# Patient Record
Sex: Female | Born: 1935 | Race: White | Hispanic: No | Smoking: Never smoker
Health system: Southern US, Community
[De-identification: ages and names within clinical notes are randomized; demographics above are authoritative.]

## PROBLEM LIST (undated history)

## (undated) DIAGNOSIS — I4891 Unspecified atrial fibrillation: Secondary | ICD-10-CM

## (undated) DIAGNOSIS — C801 Malignant (primary) neoplasm, unspecified: Secondary | ICD-10-CM

## (undated) DIAGNOSIS — R413 Other amnesia: Secondary | ICD-10-CM

## (undated) DIAGNOSIS — I499 Cardiac arrhythmia, unspecified: Secondary | ICD-10-CM

## (undated) DIAGNOSIS — I10 Essential (primary) hypertension: Secondary | ICD-10-CM

## (undated) HISTORY — PX: MASTOIDECTOMY: SHX711

## (undated) HISTORY — DX: Malignant (primary) neoplasm, unspecified: C80.1

## (undated) HISTORY — PX: OTHER SURGICAL HISTORY: SHX169

## (undated) HISTORY — DX: Unspecified atrial fibrillation: I48.91

## (undated) HISTORY — DX: Other amnesia: R41.3

## (undated) HISTORY — PX: BACK SURGERY: SHX140

## (undated) HISTORY — PX: TONSILLECTOMY: SUR1361

## (undated) HISTORY — DX: Essential (primary) hypertension: I10

## (undated) HISTORY — DX: Cardiac arrhythmia, unspecified: I49.9

---

## 1998-03-11 ENCOUNTER — Other Ambulatory Visit: Admission: RE | Admit: 1998-03-11 | Discharge: 1998-03-11 | Payer: Self-pay | Admitting: Obstetrics and Gynecology

## 1999-03-28 ENCOUNTER — Other Ambulatory Visit: Admission: RE | Admit: 1999-03-28 | Discharge: 1999-03-28 | Payer: Self-pay | Admitting: Obstetrics and Gynecology

## 1999-07-20 ENCOUNTER — Encounter (INDEPENDENT_AMBULATORY_CARE_PROVIDER_SITE_OTHER): Payer: Self-pay

## 1999-07-20 ENCOUNTER — Other Ambulatory Visit: Admission: RE | Admit: 1999-07-20 | Discharge: 1999-07-20 | Payer: Self-pay | Admitting: Obstetrics and Gynecology

## 2000-04-09 ENCOUNTER — Other Ambulatory Visit: Admission: RE | Admit: 2000-04-09 | Discharge: 2000-04-09 | Payer: Self-pay | Admitting: Obstetrics and Gynecology

## 2001-05-02 ENCOUNTER — Other Ambulatory Visit: Admission: RE | Admit: 2001-05-02 | Discharge: 2001-05-02 | Payer: Self-pay | Admitting: Obstetrics and Gynecology

## 2002-05-07 ENCOUNTER — Other Ambulatory Visit: Admission: RE | Admit: 2002-05-07 | Discharge: 2002-05-07 | Payer: Self-pay | Admitting: Obstetrics and Gynecology

## 2003-06-24 ENCOUNTER — Other Ambulatory Visit: Admission: RE | Admit: 2003-06-24 | Discharge: 2003-06-24 | Payer: Self-pay | Admitting: Obstetrics and Gynecology

## 2004-06-24 ENCOUNTER — Other Ambulatory Visit: Admission: RE | Admit: 2004-06-24 | Discharge: 2004-06-24 | Payer: Self-pay | Admitting: Obstetrics and Gynecology

## 2005-07-26 ENCOUNTER — Other Ambulatory Visit: Admission: RE | Admit: 2005-07-26 | Discharge: 2005-07-26 | Payer: Self-pay | Admitting: Obstetrics and Gynecology

## 2006-08-16 ENCOUNTER — Other Ambulatory Visit: Admission: RE | Admit: 2006-08-16 | Discharge: 2006-08-16 | Payer: Self-pay | Admitting: Obstetrics and Gynecology

## 2007-08-21 ENCOUNTER — Other Ambulatory Visit: Admission: RE | Admit: 2007-08-21 | Discharge: 2007-08-21 | Payer: Self-pay | Admitting: Obstetrics and Gynecology

## 2007-12-09 ENCOUNTER — Ambulatory Visit: Payer: Self-pay | Admitting: Internal Medicine

## 2008-09-15 ENCOUNTER — Ambulatory Visit: Payer: Self-pay | Admitting: Obstetrics and Gynecology

## 2008-10-19 ENCOUNTER — Ambulatory Visit: Payer: Self-pay | Admitting: Obstetrics and Gynecology

## 2008-10-23 HISTORY — PX: OTHER SURGICAL HISTORY: SHX169

## 2009-09-21 ENCOUNTER — Other Ambulatory Visit: Admission: RE | Admit: 2009-09-21 | Discharge: 2009-09-21 | Payer: Self-pay | Admitting: Obstetrics and Gynecology

## 2009-09-21 ENCOUNTER — Ambulatory Visit: Payer: Self-pay | Admitting: Obstetrics and Gynecology

## 2009-12-22 ENCOUNTER — Ambulatory Visit: Payer: Self-pay | Admitting: Internal Medicine

## 2010-09-28 ENCOUNTER — Ambulatory Visit: Payer: Self-pay | Admitting: Obstetrics and Gynecology

## 2010-10-06 ENCOUNTER — Ambulatory Visit: Payer: Self-pay | Admitting: Obstetrics and Gynecology

## 2011-07-24 ENCOUNTER — Ambulatory Visit: Payer: Self-pay | Admitting: Internal Medicine

## 2011-08-02 ENCOUNTER — Ambulatory Visit: Payer: Self-pay | Admitting: Internal Medicine

## 2011-08-24 ENCOUNTER — Ambulatory Visit: Payer: Self-pay | Admitting: Internal Medicine

## 2011-10-25 ENCOUNTER — Encounter: Payer: Self-pay | Admitting: Obstetrics and Gynecology

## 2011-10-30 ENCOUNTER — Encounter: Payer: Self-pay | Admitting: Gynecology

## 2011-10-30 DIAGNOSIS — C801 Malignant (primary) neoplasm, unspecified: Secondary | ICD-10-CM | POA: Insufficient documentation

## 2011-10-30 DIAGNOSIS — I10 Essential (primary) hypertension: Secondary | ICD-10-CM | POA: Insufficient documentation

## 2011-11-03 ENCOUNTER — Encounter: Payer: Self-pay | Admitting: Obstetrics and Gynecology

## 2011-11-06 ENCOUNTER — Other Ambulatory Visit (HOSPITAL_COMMUNITY)
Admission: RE | Admit: 2011-11-06 | Discharge: 2011-11-06 | Disposition: A | Discharge status: Home / Self Care | Payer: Medicare Other | Source: Ambulatory Visit | Attending: Obstetrics and Gynecology | Admitting: Obstetrics and Gynecology

## 2011-11-06 ENCOUNTER — Encounter: Payer: Self-pay | Admitting: Obstetrics and Gynecology

## 2011-11-06 ENCOUNTER — Ambulatory Visit (INDEPENDENT_AMBULATORY_CARE_PROVIDER_SITE_OTHER): Payer: Medicare Other | Admitting: Obstetrics and Gynecology

## 2011-11-06 VITALS — BP 124/78 | Ht 62.0 in | Wt 104.0 lb

## 2011-11-06 DIAGNOSIS — M8580 Other specified disorders of bone density and structure, unspecified site: Secondary | ICD-10-CM

## 2011-11-06 DIAGNOSIS — Z124 Encounter for screening for malignant neoplasm of cervix: Secondary | ICD-10-CM | POA: Insufficient documentation

## 2011-11-06 DIAGNOSIS — N951 Menopausal and female climacteric states: Secondary | ICD-10-CM

## 2011-11-06 DIAGNOSIS — N952 Postmenopausal atrophic vaginitis: Secondary | ICD-10-CM

## 2011-11-06 DIAGNOSIS — Z780 Asymptomatic menopausal state: Secondary | ICD-10-CM

## 2011-11-06 DIAGNOSIS — M949 Disorder of cartilage, unspecified: Secondary | ICD-10-CM

## 2011-11-06 DIAGNOSIS — I499 Cardiac arrhythmia, unspecified: Secondary | ICD-10-CM | POA: Insufficient documentation

## 2011-11-06 DIAGNOSIS — H8109 Meniere's disease, unspecified ear: Secondary | ICD-10-CM | POA: Insufficient documentation

## 2011-11-06 DIAGNOSIS — R413 Other amnesia: Secondary | ICD-10-CM | POA: Insufficient documentation

## 2011-11-06 NOTE — Progress Notes (Signed)
Patient came back to see me today for further followup. For many years we've treated her with hormone replacement therapy for significant symptoms. We were finally able to stop it approximately 5 years ago. She still will have menopausal symptoms but not enough to require her intervention. She and her husband are still sexually active. Although she has atrophic vaginitis she says intercourse is comfortable. She has nocturia x2. She has no incontinence. She has no dysuria or urgency. She is having no vaginal bleeding. She is having no pelvic pain. She is up-to-date on mammograms. She has very minimal bone loss. She takes her calcium and vitamin D. She has had no fractures.  ROS: 12 system review done. In addition to pertinent positives above she has Meniere" diseases which she had surgery this year. She also has significant memory loss and has had a workup that was not definitive for dementia. 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 She is hypertension and atrial fibrillation on Coumadin.  Physical examination:  Kennon Portela present. HEENT within normal limits. Neck: Thyroid not large. No masses. Supraclavicular nodes: not enlarged. Breasts: Examined in both sitting midline position. No skin changes and no masses. Abdomen: Soft no guarding rebound or masses or hernia. Pelvic: External: Within normal limits. BUS: Within normal limits. Vaginal:within normal limits. Poor estrogen effect. No evidence of cystocele rectocele or enterocele. Cervix: clean. Uterus: Normal size and shape. Adnexa: No masses. Rectovaginal exam: Confirmatory and negative. Extremities: Within normal limits.  Assessment: #1. Menopausal symptoms #2. Atrophic vaginitis #3. Nocturia #4. Low bone mass  Plan: All the above discussed in detail. We will not intervene with medication. Continue yearly mammograms. Continue periodic bone densities.

## 2011-12-25 ENCOUNTER — Ambulatory Visit: Payer: Self-pay | Admitting: Internal Medicine

## 2011-12-25 LAB — CBC CANCER CENTER
Basophil #: 0 x10 3/mm (ref 0.0–0.1)
HCT: 43.8 % (ref 35.0–47.0)
HGB: 14.6 g/dL (ref 12.0–16.0)
Lymphocyte #: 2.2 x10 3/mm (ref 1.0–3.6)
Lymphocyte %: 48.5 %
MCV: 96 fL (ref 80–100)
Monocyte #: 0.4 x10 3/mm (ref 0.0–0.7)
Neutrophil #: 1.8 x10 3/mm (ref 1.4–6.5)
RBC: 4.55 10*6/uL (ref 3.80–5.20)
WBC: 4.4 x10 3/mm (ref 3.6–11.0)

## 2012-01-22 ENCOUNTER — Ambulatory Visit: Payer: Self-pay | Admitting: Internal Medicine

## 2012-08-20 ENCOUNTER — Ambulatory Visit: Payer: Self-pay | Admitting: Internal Medicine

## 2012-08-20 LAB — CBC CANCER CENTER
Basophil %: 0.1 %
Eosinophil #: 0 x10 3/mm (ref 0.0–0.7)
Eosinophil %: 1.1 %
MCH: 30.8 pg (ref 26.0–34.0)
MCV: 94 fL (ref 80–100)
Monocyte #: 0.4 x10 3/mm (ref 0.2–0.9)
Platelet: 232 x10 3/mm (ref 150–440)
RBC: 5.31 10*6/uL — ABNORMAL HIGH (ref 3.80–5.20)

## 2012-08-23 ENCOUNTER — Ambulatory Visit: Payer: Self-pay | Admitting: Internal Medicine

## 2012-10-14 ENCOUNTER — Ambulatory Visit: Payer: Self-pay | Admitting: Internal Medicine

## 2012-11-06 ENCOUNTER — Ambulatory Visit (INDEPENDENT_AMBULATORY_CARE_PROVIDER_SITE_OTHER): Payer: Medicare Other | Admitting: Gynecology

## 2012-11-06 ENCOUNTER — Encounter: Payer: Self-pay | Admitting: Gynecology

## 2012-11-06 VITALS — BP 124/80 | Ht 62.5 in | Wt 102.0 lb

## 2012-11-06 DIAGNOSIS — M8580 Other specified disorders of bone density and structure, unspecified site: Secondary | ICD-10-CM

## 2012-11-06 DIAGNOSIS — M899 Disorder of bone, unspecified: Secondary | ICD-10-CM

## 2012-11-06 DIAGNOSIS — N952 Postmenopausal atrophic vaginitis: Secondary | ICD-10-CM

## 2012-11-06 NOTE — Patient Instructions (Signed)
Follow up for bone density

## 2012-11-06 NOTE — Progress Notes (Signed)
[name] 01-14-1926 [id number]        79 y.o.  [id number] for follow up exam.  Former patient of Dr. Verl Dicker  Past medical history,surgical history, medications, allergies, family history and social history were all reviewed and documented in the EPIC chart. ROS:  Was performed and pertinent positives and negatives are included in the history.  Exam: Kim assistant Filed Vitals:   11/06/12 1140  BP: 124/80  Height: 5' 2.5" (1.588 m)  Weight: 102 lb (46.267 kg)   General appearance  Normal Skin grossly normal Head/Neck normal with no cervical or supraclavicular adenopathy thyroid normal Lungs  clear Cardiac RR, without RMG Abdominal  soft, nontender, without masses, organomegaly or hernia Breasts  examined lying and sitting without masses, retractions, discharge or axillary adenopathy. Pelvic  Ext/BUS/vagina  normal with atrophic changes  Cervix  normal with atrophic changes  Uterus  anteverted, normal size, shape and contour, midline and mobile nontender   Adnexa  Without masses or tenderness    Anus and perineum  normal   Rectovaginal  normal sphincter tone without palpated masses or tenderness.    Assessment/Plan:  79 y.o. [id number] female for follow up exam.   1. Postmenopausal/atrophic changes. Patient's overall asymptomatic. Had been on HRT a number of years ago with hot flushes but these have resolved. Her atrophic vaginitis is not causing her any discomfort. We'll continue to monitor. 2. Osteopenia. DEXA 2009.  T score -1.3. FRAX 11%/2.5%.  Will repeat DEXA now at 4 year interval. Patient agrees to schedule. Increase calcium vitamin D reviewed. 3. Mammogram overdo it she knows to schedule this. SBE monthly reviewed. 4. Pap smear 2013. No Pap smear done today. No history of significant abnormal Pap smears. Reviewed current screening guidelines and will stop screaming and she is comfortable with this. 5. Colonoscopy. Patient up to date and will follow up with her  primary's recommendation for repeat. 6. Health maintenance. We'll check urinalysis but otherwise no lab work done as it is all done through her primary physician's office who follows her for her hypertension and atrial fibrillation. Follow up 4 bone density. Otherwise 1 year.    Dara Lords MD, 12:24 PM 11/06/2012

## 2012-11-07 LAB — URINALYSIS W MICROSCOPIC + REFLEX CULTURE
Bacteria, UA: NONE SEEN
Bilirubin Urine: NEGATIVE
Casts: NONE SEEN
Crystals: NONE SEEN
Glucose, UA: NEGATIVE mg/dL
Hgb urine dipstick: NEGATIVE
Ketones, ur: NEGATIVE mg/dL
Leukocytes, UA: NEGATIVE
Nitrite: NEGATIVE
Protein, ur: NEGATIVE mg/dL
Specific Gravity, Urine: 1.011 (ref 1.005–1.030)
Squamous Epithelial / HPF: NONE SEEN
Urobilinogen, UA: 1 mg/dL (ref 0.0–1.0)
pH: 7.5 (ref 5.0–8.0)

## 2012-11-15 ENCOUNTER — Encounter: Payer: Self-pay | Admitting: Gynecology

## 2013-01-21 ENCOUNTER — Ambulatory Visit: Payer: Self-pay | Admitting: Internal Medicine

## 2013-02-05 ENCOUNTER — Ambulatory Visit: Payer: Self-pay | Admitting: Ophthalmology

## 2013-02-26 ENCOUNTER — Ambulatory Visit: Payer: Self-pay | Admitting: Ophthalmology

## 2013-12-26 DIAGNOSIS — E782 Mixed hyperlipidemia: Secondary | ICD-10-CM | POA: Insufficient documentation

## 2014-08-24 ENCOUNTER — Encounter: Payer: Self-pay | Admitting: Gynecology

## 2014-09-03 DIAGNOSIS — I482 Chronic atrial fibrillation, unspecified: Secondary | ICD-10-CM | POA: Insufficient documentation

## 2014-09-03 DIAGNOSIS — I340 Nonrheumatic mitral (valve) insufficiency: Secondary | ICD-10-CM | POA: Insufficient documentation

## 2014-09-03 DIAGNOSIS — R600 Localized edema: Secondary | ICD-10-CM | POA: Insufficient documentation

## 2015-03-11 DIAGNOSIS — I071 Rheumatic tricuspid insufficiency: Secondary | ICD-10-CM | POA: Insufficient documentation

## 2015-06-21 DIAGNOSIS — C801 Malignant (primary) neoplasm, unspecified: Secondary | ICD-10-CM | POA: Insufficient documentation

## 2015-06-21 DIAGNOSIS — I499 Cardiac arrhythmia, unspecified: Secondary | ICD-10-CM | POA: Insufficient documentation

## 2015-06-21 DIAGNOSIS — H81319 Aural vertigo, unspecified ear: Secondary | ICD-10-CM | POA: Insufficient documentation

## 2015-06-21 DIAGNOSIS — R413 Other amnesia: Secondary | ICD-10-CM | POA: Insufficient documentation

## 2015-08-20 ENCOUNTER — Telehealth: Payer: Self-pay | Admitting: *Deleted

## 2015-08-20 MED ORDER — HYDROCODONE-ACETAMINOPHEN 5-325 MG PO TABS: 1.0000 | ORAL_TABLET | ORAL | Status: DC | PRN

## 2015-08-20 NOTE — Telephone Encounter (Signed)
Patient having severe pain in spine, only has Tylenol to take. She is down to see Choksi MOnday for [facility] spinal mets. Asking if we can prescribe her something fo rpain control

## 2015-08-20 NOTE — Addendum Note (Signed)
Addended by: Betti Cruz on: 08/20/2015 12:24 PM   Modules accepted: Orders

## 2015-08-20 NOTE — Telephone Encounter (Signed)
Informed that prescription is ready to pick up  

## 2015-08-23 ENCOUNTER — Ambulatory Visit: Payer: PRIVATE HEALTH INSURANCE | Admitting: Oncology

## 2015-08-23 ENCOUNTER — Inpatient Hospital Stay: Payer: Medicare Other

## 2015-08-23 ENCOUNTER — Inpatient Hospital Stay: Payer: Medicare Other | Attending: Oncology | Admitting: Oncology

## 2015-08-23 VITALS — BP 144/91 | HR 108 | Temp 96.7°F | Wt 96.5 lb

## 2015-08-23 DIAGNOSIS — Z85828 Personal history of other malignant neoplasm of skin: Secondary | ICD-10-CM | POA: Insufficient documentation

## 2015-08-23 DIAGNOSIS — H8109 Meniere's disease, unspecified ear: Secondary | ICD-10-CM | POA: Diagnosis not present

## 2015-08-23 DIAGNOSIS — Z79899 Other long term (current) drug therapy: Secondary | ICD-10-CM | POA: Diagnosis not present

## 2015-08-23 DIAGNOSIS — M5136 Other intervertebral disc degeneration, lumbar region: Secondary | ICD-10-CM | POA: Diagnosis not present

## 2015-08-23 DIAGNOSIS — R634 Abnormal weight loss: Secondary | ICD-10-CM | POA: Diagnosis not present

## 2015-08-23 DIAGNOSIS — Z87891 Personal history of nicotine dependence: Secondary | ICD-10-CM | POA: Insufficient documentation

## 2015-08-23 DIAGNOSIS — M545 Low back pain: Secondary | ICD-10-CM | POA: Insufficient documentation

## 2015-08-23 DIAGNOSIS — D751 Secondary polycythemia: Secondary | ICD-10-CM | POA: Diagnosis present

## 2015-08-23 DIAGNOSIS — K7689 Other specified diseases of liver: Secondary | ICD-10-CM | POA: Insufficient documentation

## 2015-08-23 DIAGNOSIS — R413 Other amnesia: Secondary | ICD-10-CM | POA: Diagnosis not present

## 2015-08-23 DIAGNOSIS — I10 Essential (primary) hypertension: Secondary | ICD-10-CM | POA: Insufficient documentation

## 2015-08-23 DIAGNOSIS — C7951 Secondary malignant neoplasm of bone: Secondary | ICD-10-CM

## 2015-08-23 DIAGNOSIS — Z7901 Long term (current) use of anticoagulants: Secondary | ICD-10-CM | POA: Insufficient documentation

## 2015-08-23 DIAGNOSIS — I4891 Unspecified atrial fibrillation: Secondary | ICD-10-CM | POA: Diagnosis not present

## 2015-08-23 LAB — CBC WITH DIFFERENTIAL/PLATELET
BASOS ABS: 0 10*3/uL (ref 0–0.1)
Basophils Relative: 0 %
EOS PCT: 1 %
Eosinophils Absolute: 0 10*3/uL (ref 0–0.7)
HCT: 50.7 % — ABNORMAL HIGH (ref 35.0–47.0)
Hemoglobin: 17.8 g/dL — ABNORMAL HIGH (ref 12.0–16.0)
LYMPHS PCT: 40 %
Lymphs Abs: 2 10*3/uL (ref 1.0–3.6)
MCH: 32.2 pg (ref 26.0–34.0)
MCHC: 35.2 g/dL (ref 32.0–36.0)
MCV: 91.5 fL (ref 80.0–100.0)
Monocytes Absolute: 0.5 10*3/uL (ref 0.2–0.9)
Monocytes Relative: 10 %
NEUTROS PCT: 49 %
Neutro Abs: 2.4 10*3/uL (ref 1.4–6.5)
PLATELETS: 330 10*3/uL (ref 150–440)
RBC: 5.54 MIL/uL — AB (ref 3.80–5.20)
RDW: 16.7 % — ABNORMAL HIGH (ref 11.5–14.5)
WBC: 4.9 10*3/uL (ref 3.6–11.0)

## 2015-08-23 LAB — COMPREHENSIVE METABOLIC PANEL
ALT: 15 U/L (ref 14–54)
AST: 30 U/L (ref 15–41)
Albumin: 4.7 g/dL (ref 3.5–5.0)
Alkaline Phosphatase: 131 U/L — ABNORMAL HIGH (ref 38–126)
Anion gap: 10 (ref 5–15)
BUN: 13 mg/dL (ref 6–20)
CHLORIDE: 94 mmol/L — AB (ref 101–111)
CO2: 29 mmol/L (ref 22–32)
CREATININE: 0.59 mg/dL (ref 0.44–1.00)
Calcium: 8.9 mg/dL (ref 8.9–10.3)
GFR calc Af Amer: >60 mL/min (ref >60)
GFR calc non Af Amer: >60 mL/min (ref >60)
Glucose, Bld: 95 mg/dL (ref 65–99)
Potassium: 3.2 mmol/L — ABNORMAL LOW (ref 3.5–5.1)
SODIUM: 133 mmol/L — AB (ref 135–145)
Total Bilirubin: 0.9 mg/dL (ref 0.3–1.2)
Total Protein: 8 g/dL (ref 6.5–8.1)

## 2015-08-23 LAB — URINALYSIS COMPLETE WITH MICROSCOPIC (ARMC ONLY)
BACTERIA UA: NONE SEEN
Bilirubin Urine: NEGATIVE
Glucose, UA: NEGATIVE mg/dL
Hgb urine dipstick: NEGATIVE
Ketones, ur: NEGATIVE mg/dL
Leukocytes, UA: NEGATIVE
Nitrite: NEGATIVE
PH: 6 (ref 5.0–8.0)
PROTEIN: NEGATIVE mg/dL
Specific Gravity, Urine: 1.01 (ref 1.005–1.030)
Squamous Epithelial / LPF: NONE SEEN

## 2015-08-23 MED ORDER — HYDROCODONE-ACETAMINOPHEN 5-325 MG PO TABS: 1.0000 | ORAL_TABLET | ORAL | Status: DC | PRN

## 2015-08-23 NOTE — Progress Notes (Signed)
[place] @ [facility] [phone]  Fax:([phone]) [place]: 1926-09-18  MR#: [id number]  HOZ#:[id number]  Patient Care Team: Rocco Serene, MD as PCP - General (Internal Medicine)  CHIEF COMPLAINT:  Chief Complaint  Patient presents with  . New Evaluation    DIAGNOSIS:   Chief Complaint/Diagnosis   Erythrocytosis  - likely secondary to prolonged stay [place] for 4 months (ecvery summer).  Workup done 08/02/11 -  Hct 50.4, Hb 17, WBC 4800, platelets 244K, EPO 8.8, COHb 0.8%. Iron study, [id number] unremarkable. U/S abdomen showed 2 liver cysts only, no splenomegaly..  2.  Acute back pain followed by abnormal MRI scan of lumbosacral spine      INTERVAL HISTORY:   81 year old lady started having low back pain which gradually got worse. Patient does not remember lifting any weight or any fall Patient never had any chronic back problems.  MRI  scan done at outside facility was abnormal with marrow replacement involving L4-L5 vertebral bodies with loss of height.   Patient's pain is constant dull aching.  There is diffuse degenerative disease.   Vicodin helps Patient but sometimes can make her sick if she takes some 2*marker when she tried to take 2 tablets.  . Patient has lost some weight in last few months.  Does not smoke. Here for further evaluation and treatment consideration had no history of malignancy getting regular mammogram done    REVIEW OF SYSTEMS:   Gen. status: Patient is in mild distress because of increasing low back pain Difficult patient to lie down and ambulate Musculoskeletal system: Pain is localized in low back area ,no swelling.Marland Kitchen GI: No nausea no vomiting no diarrhea Skin: No rash Neurological system no headache no dizziness. Cardiac: No chest pain Lungs: No cough or shortness of breath Lower extremity no swelling Patient has a regular mammogram done patient had skull x-rays prior to MRI scan  which did not reveal any metastases disease.  Had a chest x-ray done in September 2016 at outside institution I do not have results available. Patient has a history of erythrocytosis and used to be followed by hematology seen or practice  As per HPI. Otherwise, a complete review of systems is negatve.  PAST MEDICAL HISTORY: Past Medical History  Diagnosis Date  . Hypertension   . Cancer     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  . Meniere's disease   . Irregular heart beat   . Memory difficulty   . Atrial fibrillation     PAST SURGICAL HISTORY: Past Surgical History  Procedure Laterality Date  . Mastoidectomy    . Squamous cell excised  2010    Ear  . Menieres surg    . Tonsillectomy      FAMILY HISTORY Family History  Problem Relation Age of Onset  . Hypertension Mother   . Hypertension Father   . Heart disease Father      Additional Past Medical and Surgical History Past Medical History/Past Surgical History - Hypertension Hyperlipidemia Chronic atrial fibrillation Arthritis History of Mnire's disease status post Mnire's surgery Thyroglossal duct cyst removal   Family History - Noncontributory.  Denies hematological disorders or malignancy.  Social History - Denies any history of recent smoking or secondhand smoke exposure (states that she smoked for about 10 years but quit 50 years ago).  Occasional alcohol usage.  Physically active and ambulatory.       ADVANCED DIRECTIVES:    HEALTH MAINTENANCE: Social History  Substance Use Topics  . Smoking status: Former Research scientist (life sciences)  . Smokeless tobacco: Not on file  . Alcohol Use: 3.5 oz/week    7 drink(s) per week        Current Outpatient Prescriptions  Medication Sig Dispense Refill  . amLODipine (NORVASC) 5 MG tablet Take 5 mg by mouth daily.      . mirtazapine (REMERON) 15 MG tablet Take 15 mg by mouth at bedtime.    Marland Kitchen warfarin (COUMADIN) 5 MG tablet Take 5 mg by mouth daily.     No current facility-administered  medications for this visit.    OBJECTIVE: PHYSICAL EXAM: GENERAL: Patient is in mild distress because of pain unable to lie down flat on her back because of discomfort. MENTAL STATUS:  Alert and oriented to person, place and time. . ENT:  Oropharynx clear without lesion.  Tongue normal. Mucous membranes moist.  RESPIRATORY:  Clear to auscultation without rales, wheezes or rhonchi. CARDIOVASCULAR:  Regular rate and rhythm without murmur, rub or gallop. BREAST:  Right breast without masses, skin changes or nipple discharge.  Left breast without masses, skin changes or nipple discharge. ABDOMEN:  Soft, non-tender, with active bowel sounds, and no hepatosplenomegaly.  No masses. BACK:  No CVA tenderness.  No tenderness on percussion of the back or rib cage. SKIN:  No rashes, ulcers or lesions. EXTREMITIES: No edema, no skin discoloration or tenderness.  No palpable cords. LYMPH NODES: No palpable cervical, supraclavicular, axillary or inguinal adenopathy  NEUROLOGICAL: Unremarkable. PSYCH:  Appropriate. Musculoskeletal system tenderness in mid back area  Filed Vitals:   08/23/15 1351  BP: 144/91  Pulse: 108  Temp: 96.7 F (35.9 C)     Body mass index is 17.36 kg/(m^2).    ECOG FS:2 - Symptomatic, <50% confined to bed  LAB RESULTS:  No visits with results within 5 Day(s) from this visit. Latest known visit with results is:  Office Visit on 11/06/2012  Component Date Value Ref Range Status  . Color, Urine 11/06/2012 YELLOW  YELLOW Final  . APPearance 11/06/2012 CLEAR  CLEAR Final  . Specific Gravity, Urine 11/06/2012 1.011  1.005 - 1.030 Final  . pH 11/06/2012 7.5  5.0 - 8.0 Final  . Glucose, UA 11/06/2012 NEG  NEG mg/dL Final  . Bilirubin Urine 11/06/2012 NEG  NEG Final  . Ketones, ur 11/06/2012 NEG  NEG mg/dL Final  . Hgb urine dipstick 11/06/2012 NEG  NEG Final  . Protein, ur 11/06/2012 NEG  NEG mg/dL Final  . Urobilinogen, UA 11/06/2012 1  0.0 - 1.0 mg/dL Final  . Nitrite  11/06/2012 NEG  NEG Final  . Leukocytes, UA 11/06/2012 NEG  NEG Final  . Squamous Epithelial / LPF 11/06/2012 NONE SEEN  RARE Final  . Crystals 11/06/2012 NONE SEEN  NONE SEEN Final  . Casts 11/06/2012 NONE SEEN  NONE SEEN Final  . WBC, UA 11/06/2012 0-2  <3 WBC/hpf Final  . RBC / HPF 11/06/2012 0-2  <3 RBC/hpf Final  . Bacteria, UA 11/06/2012 NONE SEEN  RARE Final      ASSESSMENT:  Patient presented with abnormal MRI scan of lumbosacral spine.   I reviewed that MRI scan with the patient.  There is marrow replacing lesions seen L4-L5 area with compression fracture  Patient does not have any previous history of malignancy  Has a history of erythrocytosis in the past   My discussion with patient and family was that there is no clear-cut evidence of any soft tissue mass or significant bony  destruction  I encouraged patient to take half to one tablet of Vicodin round-the-clock at least 3 times a day after lunch, breakfast, dinner and take Tylenol in between for pain If needed long-acting pain medication like fentanyl patch or OxyContin can be considered. SIEP, light chain, Complete blood work, CT scan of abdomen pelvis and chest to rule out any malignancy and if needed bone marrow aspiration and biopsy would be done.  I discussed all those findings with the patient and family And they are in agreement to proceed with further evaluation   Reevaluate patient after all this information is available.   Patient expressed understanding and was in agreement with this plan. She also understands that She can call clinic at any time with any questions, concerns, or complaints.    No matching staging information was found for the patient.  Forest Gleason, MD   08/23/2015 2:07 PM

## 2015-08-23 NOTE — Progress Notes (Signed)
Patient here today as new evaluation for spinal mets referred by Dr. Loney Hering. Patient has had nausea over the weekend.  States her pain is 10/10.

## 2015-08-24 ENCOUNTER — Encounter: Payer: Self-pay | Admitting: Oncology

## 2015-08-24 LAB — PROTEIN ELECTROPHORESIS, SERUM
A/G RATIO SPE: 1.2 (ref 0.7–1.7)
ALBUMIN ELP: 3.8 g/dL (ref 2.9–4.4)
ALPHA-1-GLOBULIN: 0.3 g/dL (ref 0.0–0.4)
Alpha-2-Globulin: 0.9 g/dL (ref 0.4–1.0)
Beta Globulin: 1.3 g/dL (ref 0.7–1.3)
GAMMA GLOBULIN: 0.8 g/dL (ref 0.4–1.8)
GLOBULIN, TOTAL: 3.3 g/dL (ref 2.2–3.9)
TOTAL PROTEIN ELP: 7.1 g/dL (ref 6.0–8.5)

## 2015-08-24 LAB — MULTIPLE MYELOMA PANEL, SERUM
ALBUMIN/GLOB SERPL: 1.2 (ref 0.7–1.7)
ALPHA 1: 0.4 g/dL (ref 0.0–0.4)
ALPHA2 GLOB SERPL ELPH-MCNC: 0.8 g/dL (ref 0.4–1.0)
Albumin SerPl Elph-Mcnc: 3.9 g/dL (ref 2.9–4.4)
B-GLOBULIN SERPL ELPH-MCNC: 1.3 g/dL (ref 0.7–1.3)
Gamma Glob SerPl Elph-Mcnc: 0.8 g/dL (ref 0.4–1.8)
Globulin, Total: 3.3 g/dL (ref 2.2–3.9)
IGG (IMMUNOGLOBIN G), SERUM: 776 mg/dL (ref 700–1600)
IgA: 411 mg/dL (ref 64–422)
IgM, Serum: 116 mg/dL (ref 26–217)
Total Protein ELP: 7.2 g/dL (ref 6.0–8.5)

## 2015-08-24 LAB — KAPPA/LAMBDA LIGHT CHAINS
KAPPA FREE LGHT CHN: 12.1 mg/L (ref 3.30–19.40)
Kappa, lambda light chain ratio: 1.63 (ref 0.26–1.65)
LAMDA FREE LIGHT CHAINS: 7.43 mg/L (ref 5.71–26.30)

## 2015-08-25 ENCOUNTER — Telehealth: Payer: Self-pay | Admitting: *Deleted

## 2015-08-25 MED ORDER — FENTANYL 12 MCG/HR TD PT72: 12.5000 ug | MEDICATED_PATCH | TRANSDERMAL | Status: DC

## 2015-08-25 NOTE — Telephone Encounter (Signed)
States it was discussed and said that md would order a patch for pt, but did not get rx. Also requesting nausea med

## 2015-08-25 NOTE — Telephone Encounter (Signed)
Informed that prescription is ready to pick up  

## 2015-08-27 ENCOUNTER — Telehealth: Payer: Self-pay | Admitting: *Deleted

## 2015-08-27 NOTE — Telephone Encounter (Signed)
Called pt to inform of MD recommendations. Pt did not answer. Message left to call back.

## 2015-08-27 NOTE — Telephone Encounter (Signed)
Spoke with pt's husband and informed him about MD recommendations to take patch off at this time and hold it until follows up with MD next week. Mr. Schow stated that he is worried about managing his wife's pain over the weekend. Instructed Mr. Merrick that she will need to manage her pain with vicodin at this time and can re-evaluate pt at next appt. Mr. Talford stated that the vicodin makes her stomach upset and is very confused about how to manage her pain without the patch. Informed pt's husband that will speak with MD on Monday about what can be done regarding pain management. Mr. Falcon verbalized understanding and stated will take her patch off for now and hold onto them until sees MD next week.

## 2015-08-27 NOTE — Telephone Encounter (Signed)
-----   Message from Forest Gleason, MD sent at 08/27/2015  4:22 AM EDT ----- Regarding: Chill,Kamill Husband asked and got duragesic   12 mcg patch for her..please call   And tell them that  They want to hold of using it till I see her and control pain with vicodin . I am worried about her being elderly and may develop respiratory failure with duragesic

## 2015-09-01 ENCOUNTER — Ambulatory Visit
Admission: RE | Admit: 2015-09-01 | Discharge: 2015-09-01 | Disposition: A | Discharge status: Home / Self Care | Payer: Medicare Other | Source: Ambulatory Visit | Attending: Oncology | Admitting: Oncology

## 2015-09-01 DIAGNOSIS — C7951 Secondary malignant neoplasm of bone: Secondary | ICD-10-CM

## 2015-09-01 DIAGNOSIS — M438X6 Other specified deforming dorsopathies, lumbar region: Secondary | ICD-10-CM | POA: Diagnosis not present

## 2015-09-01 DIAGNOSIS — M438X4 Other specified deforming dorsopathies, thoracic region: Secondary | ICD-10-CM | POA: Insufficient documentation

## 2015-09-01 DIAGNOSIS — C801 Malignant (primary) neoplasm, unspecified: Secondary | ICD-10-CM | POA: Insufficient documentation

## 2015-09-01 DIAGNOSIS — I2510 Atherosclerotic heart disease of native coronary artery without angina pectoris: Secondary | ICD-10-CM | POA: Insufficient documentation

## 2015-09-01 MED ORDER — IOHEXOL 300 MG/ML  SOLN
75.0000 mL | Freq: Once | INTRAMUSCULAR | Status: AC | PRN
  Administered 2015-09-01: 75 mL via INTRAVENOUS

## 2015-09-02 ENCOUNTER — Other Ambulatory Visit: Payer: Self-pay | Admitting: Oncology

## 2015-09-02 ENCOUNTER — Inpatient Hospital Stay: Payer: Medicare Other | Attending: Oncology | Admitting: Oncology

## 2015-09-02 VITALS — BP 133/86 | HR 96 | Temp 97.4°F | Wt 96.1 lb

## 2015-09-02 DIAGNOSIS — R937 Abnormal findings on diagnostic imaging of other parts of musculoskeletal system: Secondary | ICD-10-CM

## 2015-09-02 DIAGNOSIS — E785 Hyperlipidemia, unspecified: Secondary | ICD-10-CM | POA: Diagnosis not present

## 2015-09-02 DIAGNOSIS — I482 Chronic atrial fibrillation: Secondary | ICD-10-CM

## 2015-09-02 DIAGNOSIS — R634 Abnormal weight loss: Secondary | ICD-10-CM

## 2015-09-02 DIAGNOSIS — S32000A Wedge compression fracture of unspecified lumbar vertebra, initial encounter for closed fracture: Secondary | ICD-10-CM

## 2015-09-02 DIAGNOSIS — M1990 Unspecified osteoarthritis, unspecified site: Secondary | ICD-10-CM

## 2015-09-02 DIAGNOSIS — Z79899 Other long term (current) drug therapy: Secondary | ICD-10-CM | POA: Diagnosis not present

## 2015-09-02 DIAGNOSIS — M549 Dorsalgia, unspecified: Secondary | ICD-10-CM

## 2015-09-02 DIAGNOSIS — I10 Essential (primary) hypertension: Secondary | ICD-10-CM | POA: Diagnosis not present

## 2015-09-02 DIAGNOSIS — D751 Secondary polycythemia: Secondary | ICD-10-CM

## 2015-09-02 DIAGNOSIS — Z85828 Personal history of other malignant neoplasm of skin: Secondary | ICD-10-CM | POA: Diagnosis not present

## 2015-09-02 DIAGNOSIS — K7689 Other specified diseases of liver: Secondary | ICD-10-CM | POA: Diagnosis not present

## 2015-09-02 DIAGNOSIS — I2510 Atherosclerotic heart disease of native coronary artery without angina pectoris: Secondary | ICD-10-CM | POA: Diagnosis not present

## 2015-09-02 DIAGNOSIS — Z7901 Long term (current) use of anticoagulants: Secondary | ICD-10-CM | POA: Diagnosis not present

## 2015-09-02 DIAGNOSIS — R930 Abnormal findings on diagnostic imaging of skull and head, not elsewhere classified: Secondary | ICD-10-CM

## 2015-09-02 DIAGNOSIS — Z87891 Personal history of nicotine dependence: Secondary | ICD-10-CM | POA: Diagnosis not present

## 2015-09-02 NOTE — Progress Notes (Signed)
Patient here today for CT Scan

## 2015-09-04 ENCOUNTER — Encounter: Payer: Self-pay | Admitting: Oncology

## 2015-09-04 NOTE — Progress Notes (Signed)
[facility] @ [facility] [phone]  Fax:([phone] [place]: 1926-02-03  MR#: [id number]  PPI#:[id number]  Patient Care Team: Derinda Late, MD as PCP - General (Family Medicine)  CHIEF COMPLAINT:  Chief Complaint  Patient presents with  . Results    DIAGNOSIS:   Chief Complaint/Diagnosis   Erythrocytosis  - likely secondary to prolonged stay [place] for 4 months (ecvery summer).  Workup done 08/02/11 -  Hct 50.4, Hb 17, WBC 4800, platelets 244K, EPO 8.8, COHb 0.8%. Iron study, [id number] unremarkable. U/S abdomen showed 2 liver cysts only, no splenomegaly..  2.  Acute back pain followed by abnormal MRI scan of lumbosacral spine      INTERVAL HISTORY:   81 year old lady started having low back pain which gradually got worse. Patient does not remember lifting any weight or any fall Patient never had any chronic back problems.  MRI  scan done at outside facility was abnormal with marrow replacement involving L4-L5 vertebral bodies with loss of height.   Patient's pain is constant dull aching.  There is diffuse degenerative disease.   Vicodin helps Patient but sometimes can make her sick if she takes some 2*marker when she tried to take 2 tablets.  . Patient has lost some weight in last few months.  Does not smoke. Here for further evaluation and treatment consideration had no history of malignancy getting regular mammogram done Had a CT scan of chest abdomen and pelvis Staging very poor control of pain.  We started fentanyl patch was discontinued.  Patient does not tolerate hydrocodone that well Pain appears to be low back pain of moderate severe bradycardia    REVIEW OF SYSTEMS:   Gen. status: Patient is in mild distress because of increasing low back pain Difficult patient to lie down and ambulate Musculoskeletal system: Pain is localized in low back area ,no swelling.Marland Kitchen GI: No nausea no vomiting no diarrhea Skin:  No rash Neurological system no headache no dizziness. Cardiac: No chest pain Lungs: No cough or shortness of breath Lower extremity no swelling Patient has a regular mammogram done patient had skull x-rays prior to MRI scan which did not reveal any metastases disease.  Had a chest x-ray done in September 2016 at outside institution I do not have results available. Patient has a history of erythrocytosis and used to be followed by hematology seen or practice  As per HPI. Otherwise, a complete review of systems is negatve.  PAST MEDICAL HISTORY: Past Medical History  Diagnosis Date  . Hypertension   . 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 Meniere's disease   . Irregular heart beat   . Memory difficulty   . Atrial fibrillation ([place])     PAST SURGICAL HISTORY: Past Surgical History  Procedure Laterality Date  . Mastoidectomy    . Squamous cell excised  2010    Ear  . Menieres surg    . Tonsillectomy      FAMILY HISTORY Family History  Problem Relation Age of Onset  . Hypertension Mother   . Hypertension Father   . Heart disease Father      Additional Past Medical and Surgical History Past Medical History/Past Surgical History - Hypertension Hyperlipidemia Chronic atrial fibrillation Arthritis History of Mnire's disease status post Mnire's surgery Thyroglossal duct cyst removal   Family History - Noncontributory.  Denies hematological disorders or malignancy.  Social History - Denies any history of recent smoking or  secondhand smoke exposure (states that she smoked for about 10 years but quit 50 years ago).  Occasional alcohol usage.  Physically active and ambulatory.       ADVANCED DIRECTIVES:   Patient does have advance healthcare directive, Patient   does not desire to make any changes HEALTH MAINTENANCE: Social History  Substance Use Topics  . Smoking status: Former Research scientist (life sciences)  . Smokeless tobacco: None  . Alcohol Use: 3.5 oz/week    7 drink(s) per  week        Current Outpatient Prescriptions  Medication Sig Dispense Refill  . amLODipine (NORVASC) 5 MG tablet Take 5 mg by mouth daily.      . fentaNYL (DURAGESIC - DOSED MCG/HR) 12 MCG/HR Place 1 patch (12.5 mcg total) onto the skin every 3 (three) days. 10 patch 0  . HYDROcodone-acetaminophen (NORCO/VICODIN) 5-325 MG tablet Take 1-2 tablets by mouth every 4 (four) hours as needed for moderate pain. 30 tablet 0  . mirtazapine (REMERON) 15 MG tablet Take 15 mg by mouth at bedtime.    Marland Kitchen warfarin (COUMADIN) 5 MG tablet Take 5 mg by mouth daily.     No current facility-administered medications for this visit.    OBJECTIVE: PHYSICAL EXAM: GENERAL: Patient is in mild distress because of pain unable to lie down flat on her back because of discomfort. MENTAL STATUS:  Alert and oriented to person, place and time. . ENT:  Oropharynx clear without lesion.  Tongue normal. Mucous membranes moist.  RESPIRATORY:  Clear to auscultation without rales, wheezes or rhonchi. CARDIOVASCULAR:  Regular rate and rhythm without murmur, rub or gallop. BREAST:  Right breast without masses, skin changes or nipple discharge.  Left breast without masses, skin changes or nipple discharge. ABDOMEN:  Soft, non-tender, with active bowel sounds, and no hepatosplenomegaly.  No masses. BACK:  No CVA tenderness.  No tenderness on percussion of the back or rib cage. SKIN:  No rashes, ulcers or lesions. EXTREMITIES: No edema, no skin discoloration or tenderness.  No palpable cords. LYMPH NODES: No palpable cervical, supraclavicular, axillary or inguinal adenopathy  NEUROLOGICAL: Unremarkable. PSYCH:  Appropriate. Musculoskeletal system tenderness in mid back area  Filed Vitals:   09/02/15 1403  BP: 133/86  Pulse: 96  Temp: 97.4 F (36.3 C)     Body mass index is 17.29 kg/(m^2).    ECOG FS:2 - Symptomatic, <50% confined to bed IMPRESSION: 1. Superior endplate compression deformities involving T6, L1, L4 and  L5, age indeterminate. Patient reportedly had a recent MRI, which is not available for comparison. 2. A primary malignancy is not identified in the chest, abdomen or pelvis. 3. Extensive 3 vessel coronary artery calcification. 4. Question cirrhosis. LAB RESULTS:  No visits with results within 5 Day(s) from this visit. Latest known visit with results is:  Appointment on 08/23/2015  Component Date Value Ref Range Status  . Total Protein ELP 08/23/2015 7.1  6.0 - 8.5 g/dL Final  . Albumin ELP 08/23/2015 3.8  2.9 - 4.4 g/dL Final  . Alpha-1-Globulin 08/23/2015 0.3  0.0 - 0.4 g/dL Final  . Alpha-2-Globulin 08/23/2015 0.9  0.4 - 1.0 g/dL Final  . Beta Globulin 08/23/2015 1.3  0.7 - 1.3 g/dL Final  . Gamma Globulin 08/23/2015 0.8  0.4 - 1.8 g/dL Final  . M-Spike, % 08/23/2015 Not Observed  Not Observed g/dL Final  . SPE Interp. 08/23/2015 Comment   Final   Comment: (NOTE) The SPE pattern appears essentially unremarkable. Evidence of monoclonal protein is not apparent. Performed  At: [facility] [place], [place] [id number] Lindon Romp MD BV:[id number]   . Comment 08/23/2015 Comment   Final   Comment: (NOTE) Protein electrophoresis scan will follow via computer, mail, or courier delivery.   Marland Kitchen GLOBULIN, TOTAL 08/23/2015 3.3  2.2 - 3.9 g/dL Corrected  . A/G Ratio 08/23/2015 1.2  0.7 - 1.7 Corrected  . WBC 08/23/2015 4.9  3.6 - 11.0 K/uL Final  . RBC 08/23/2015 5.54* 3.80 - 5.20 MIL/uL Final  . Hemoglobin 08/23/2015 17.8* 12.0 - 16.0 g/dL Final  . HCT 08/23/2015 50.7* 35.0 - 47.0 % Final  . MCV 08/23/2015 91.5  80.0 - 100.0 fL Final  . MCH 08/23/2015 32.2  26.0 - 34.0 pg Final  . MCHC 08/23/2015 35.2  32.0 - 36.0 g/dL Final  . RDW 08/23/2015 16.7* 11.5 - 14.5 % Final  . Platelets 08/23/2015 330  150 - 440 K/uL Final  . Neutrophils Relative % 08/23/2015 49   Final  . Neutro Abs 08/23/2015 2.4  1.4 - 6.5 K/uL Final  . Lymphocytes Relative 08/23/2015 40    Final  . Lymphs Abs 08/23/2015 2.0  1.0 - 3.6 K/uL Final  . Monocytes Relative 08/23/2015 10   Final  . Monocytes Absolute 08/23/2015 0.5  0.2 - 0.9 K/uL Final  . Eosinophils Relative 08/23/2015 1   Final  . Eosinophils Absolute 08/23/2015 0.0  0 - 0.7 K/uL Final  . Basophils Relative 08/23/2015 0   Final  . Basophils Absolute 08/23/2015 0.0  0 - 0.1 K/uL Final  . Sodium 08/23/2015 133* 135 - 145 mmol/L Final  . Potassium 08/23/2015 3.2* 3.5 - 5.1 mmol/L Final  . Chloride 08/23/2015 94* 101 - 111 mmol/L Final  . CO2 08/23/2015 29  22 - 32 mmol/L Final  . Glucose, Bld 08/23/2015 95  65 - 99 mg/dL Final  . BUN 08/23/2015 13  6 - 20 mg/dL Final  . Creatinine, Ser 08/23/2015 0.59  0.44 - 1.00 mg/dL Final  . Calcium 08/23/2015 8.9  8.9 - 10.3 mg/dL Final  . Total Protein 08/23/2015 8.0  6.5 - 8.1 g/dL Final  . Albumin 08/23/2015 4.7  3.5 - 5.0 g/dL Final  . AST 08/23/2015 30  15 - 41 U/L Final  . ALT 08/23/2015 15  14 - 54 U/L Final  . Alkaline Phosphatase 08/23/2015 131* 38 - 126 U/L Final  . Total Bilirubin 08/23/2015 0.9  0.3 - 1.2 mg/dL Final  . GFR calc non Af Amer 08/23/2015 >60  >60 mL/min Final  . GFR calc Af Amer 08/23/2015 >60  >60 mL/min Final   Comment: (NOTE) The eGFR has been calculated using the CKD EPI equation. This calculation has not been validated in all clinical situations. eGFR's persistently <60 mL/min signify possible Chronic Kidney Disease.   . Anion gap 08/23/2015 10  5 - 15 Final  . IgG (Immunoglobin G), Serum 08/23/2015 776  700 - 1600 mg/dL Final  . IgA 08/23/2015 411  64 - 422 mg/dL Final  . IgM, Serum 08/23/2015 116  26 - 217 mg/dL Final  . Total Protein ELP 08/23/2015 7.2  6.0 - 8.5 g/dL Corrected  . Albumin SerPl Elph-Mcnc 08/23/2015 3.9  2.9 - 4.4 g/dL Corrected  . Alpha 1 08/23/2015 0.4  0.0 - 0.4 g/dL Corrected  . Alpha2 Glob SerPl Elph-Mcnc 08/23/2015 0.8  0.4 - 1.0 g/dL Corrected  . B-Globulin SerPl Elph-Mcnc 08/23/2015 1.3  0.7 - 1.3 g/dL  Corrected  . Gamma Glob SerPl Elph-Mcnc 08/23/2015  0.8  0.4 - 1.8 g/dL Corrected  . M Protein SerPl Elph-Mcnc 08/23/2015 Not Observed  Not Observed g/dL Corrected  . Globulin, Total 08/23/2015 3.3  2.2 - 3.9 g/dL Corrected  . Albumin/Glob SerPl 08/23/2015 1.2  0.7 - 1.7 Corrected  . IFE 1 08/23/2015 Comment   Corrected   An apparent normal immunofixation pattern.  . Please Note 08/23/2015 Comment   Corrected   Comment: (NOTE) Protein electrophoresis scan will follow via computer, mail, or courier delivery. Performed At: [facility] [place], [place] [id number] Lindon Romp MD OR:[id number]   . Kappa free light chain 08/23/2015 12.10  3.30 - 19.40 mg/L Final  . Lamda free light chains 08/23/2015 7.43  5.71 - 26.30 mg/L Final  . Kappa, lamda light chain ratio 08/23/2015 1.63  0.26 - 1.65 Final   Comment: (NOTE) Performed At: [facility] [place], [place] [id number] Lindon Romp MD VF:[id number]   . Color, Urine 08/23/2015 YELLOW* YELLOW Final  . APPearance 08/23/2015 CLEAR* CLEAR Final  . Glucose, UA 08/23/2015 NEGATIVE  NEGATIVE mg/dL Final  . Bilirubin Urine 08/23/2015 NEGATIVE  NEGATIVE Final  . Ketones, ur 08/23/2015 NEGATIVE  NEGATIVE mg/dL Final  . Specific Gravity, Urine 08/23/2015 1.010  1.005 - 1.030 Final  . Hgb urine dipstick 08/23/2015 NEGATIVE  NEGATIVE Final  . pH 08/23/2015 6.0  5.0 - 8.0 Final  . Protein, ur 08/23/2015 NEGATIVE  NEGATIVE mg/dL Final  . Nitrite 08/23/2015 NEGATIVE  NEGATIVE Final  . Leukocytes, UA 08/23/2015 NEGATIVE  NEGATIVE Final  . RBC / HPF 08/23/2015 0-5  0 - 5 RBC/hpf Final  . WBC, UA 08/23/2015 0-5  0 - 5 WBC/hpf Final  . Bacteria, UA 08/23/2015 NONE SEEN  NONE SEEN Final  . Squamous Epithelial / LPF 08/23/2015 NONE SEEN  NONE SEEN Final  . Hyaline Casts, UA 08/23/2015 PRESENT   Final      ASSESSMENT:  Patient presented with abnormal MRI scan of lumbosacral spine.   CT scan of the  chest and abdomen has been reviewed independently there is no evidence of any malignancy. I discussed case with radiologist and MRI scan which was from outside films were reviewed there was some suspicion on contrast that could be suggestive of malignancy or metastases however osteoporosis and fracture cannot be ruled out. I advised patient to go back on fentanyl patch Suggested we might get help of pain clinic physician to control pain Discussed with interventional radiologist to see whether Ikyphoplastyof help to do biopsy as well as relieve pain appointment has been made as outpatient to interventional clinic  Possibility of bone marrow aspiration biopsy also was considered Case will be discussed in tumor conference Total duration of visit was 45 minutes.  50% or more time was spent in counseling patient and family regarding prognosis and options of treatment and available resources   Reevaluate patient after all this information is available.   Patient expressed understanding and was in agreement with this plan. She also understands that She can call clinic at any time with any questions, concerns, or complaints.    No matching staging information was found for the patient.  Forest Gleason, MD   09/04/2015 11:50 AM

## 2015-09-06 ENCOUNTER — Telehealth: Payer: Self-pay | Admitting: *Deleted

## 2015-09-06 MED ORDER — ONDANSETRON HCL 4 MG PO TABS: 4.0000 mg | ORAL_TABLET | Freq: Four times a day (QID) | ORAL | Status: DC | PRN

## 2015-09-06 NOTE — Telephone Encounter (Signed)
Called with questions regarding appt with surgeon which were answered in that he wants to see her before doing any procedure on her and that a bx is planned at same times as kyphoplasty Asking if she can have something for nausea and that the 2 pain meds she has is not controlling her pain

## 2015-09-06 NOTE — Telephone Encounter (Signed)
Ondansetron 4 mg qid prn and if not dizzy tomorrow can increase fentanyl to 48 mcg Britt informed of this and repeated back to me

## 2015-09-07 ENCOUNTER — Ambulatory Visit
Admission: RE | Admit: 2015-09-07 | Discharge: 2015-09-07 | Disposition: A | Discharge status: Home / Self Care | Payer: Medicare Other | Source: Ambulatory Visit | Attending: Oncology | Admitting: Oncology

## 2015-09-07 DIAGNOSIS — S32000A Wedge compression fracture of unspecified lumbar vertebra, initial encounter for closed fracture: Secondary | ICD-10-CM

## 2015-09-13 ENCOUNTER — Telehealth: Payer: Self-pay | Admitting: *Deleted

## 2015-09-13 MED ORDER — PROCHLORPERAZINE MALEATE 10 MG PO TABS: 10.0000 mg | ORAL_TABLET | Freq: Four times a day (QID) | ORAL | Status: DC | PRN

## 2015-09-13 NOTE — Telephone Encounter (Signed)
Having nausea not controlled by Zofran, Asking if she should remove her fentanyl patch. States she has been drinking Pepto Bismol and that she had a black stool. I explained that Pepto can cause black stools and that she should not remove her fentanyl because her pain would increase if she did. She reports that she has not eaten in 2 days because of nausea. Can we change her nausea med?

## 2015-09-13 NOTE — Telephone Encounter (Signed)
Compazine ordered per Dr Cloyd Stagers informed and instructed to alternate between the compazine and zofran per Dr B and to stop the Pepto. She repeated this back to me

## 2015-09-14 ENCOUNTER — Other Ambulatory Visit: Payer: Self-pay | Admitting: *Deleted

## 2015-09-14 ENCOUNTER — Inpatient Hospital Stay
Admission: RE | Admit: 2015-09-14 | Discharge: 2015-09-14 | Disposition: A | Discharge status: Home / Self Care | Payer: Self-pay | Source: Ambulatory Visit | Attending: *Deleted | Admitting: *Deleted

## 2015-09-14 DIAGNOSIS — M4310 Spondylolisthesis, site unspecified: Secondary | ICD-10-CM

## 2015-09-15 NOTE — Progress Notes (Signed)
Cardiac clearance obtained from Dr. Serafina Royals [phone]; [facility] in [place]), so patient may stop Coumadin for KP and does not need to bridge with Lovenox.  Find this on page 4 of notes scanned into Epic under "media; interventional radiology; 09/15/15."  Tiffany at [facility] notified this needs to be scheduled with any radiologist ASAP, as patient is in much pain.  She also is aware patient's insurance requires no pre-authorization (neither MCR nor secondary).  Left separate message for Tiffany to schedule patient through her daughter-in-law, Velita Machida [phone]. [phone]).

## 2015-09-15 NOTE — Consult Note (Signed)
Chief Complaint: Patient was seen in consultation today for  Chief Complaint  Patient presents with  . Advice Only    Consult for Kyphoplasty of L4-L5 Compression Fx   at the request of Choksi,Janak  Referring Physician(s): Choksi,Janak  History of Present Illness: [name] is a 81 y.o. female with ongoing lumbar back pain.  She was first seen by a spine clinic and spine surgeon with MRI imaging performed at a facility in [place], [place].  Results of this MRI prompted a referral to Dr. Oliva Bustard with concerns for malignancy.    [name] was seen 09/07/2015 in the [facility].  Her husband and daughter were present for the consultation.   She reports ongoing back pain that has been treated with oral pain medications.  This was pain that came on without any knowledge of any prior injury or fall, and has been present for some time.  [name] says that although the pain is not unremitting, it definitely affects her tasks of daily living, and her family confirms that she is unable to participate with activities as she once had.  For example, she was once able to go to the gym and walk with her husband as frequently as she wanted without pain, but this has changed since the onset.  [name] only places the pain level at a level of approximately 6-7/10.    Although it seems that the oral pain medicines have been working intermittently, she does report that the medicines make her feel funny, and that she would prefer not to take any additional pain medicines.  Her family agree that they seem to change her behavior.    I was able to review the MRI imaging that was performed previously at the spine center.  I reviewed these images 09/14/2015 with [facility] neuroradiology.    Although there is extensive edema through the L4 and L5 vertebral bodies associated with the fractures, there is no MR evidence of malignancy.  The avid enhancement is likely related to the  fracture pathology.  In addition, she has no history of malignancy, and there was no evidence of malignancy on a recent and interval CT of the body.    When I saw her and her family in the clinic, I had an extensive discussion about the anatomy, pathology, pathophysiology, and treatment strategies for compression fractures.  Regarding our percutaneous treatment, I discussed with them 2 major goals.  First is pain control, with the best candidates experiencing extreme debilitating pain before treatment, as the treatment will not always cure all of the pain  Our goal is typically to get to a pain level of 3-4/10, which is typically tolerable.  Secondarily, pain control allows [place] to decrease the frequency of pain medicine, and can help with the side-effect profile. In addition, I went over all of the possible risks involved, including bleeding, infection, local injury including nerve injury and disability, embolism, need for further procedure/surgery, cardiopulmonary collapse, death.    They understand these goals, and all of the potential risks.      Past Medical History  Diagnosis Date  . Hypertension   . 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 Meniere's disease   . Irregular heart beat   . Memory difficulty   . Atrial fibrillation [facility])     Past Surgical History  Procedure Laterality Date  . Mastoidectomy    . Squamous cell excised  2010  Ear  . [place] surg    . Tonsillectomy      Allergies: Review of patient's allergies indicates no known allergies.  Medications: Prior to Admission medications   Medication Sig Start Date End Date Taking? Authorizing Provider  amLODipine (NORVASC) 5 MG tablet Take 5 mg by mouth daily.     Yes Historical Provider, MD  fentaNYL (DURAGESIC - DOSED MCG/HR) 12 MCG/HR Place 1 patch (12.5 mcg total) onto the skin every 3 (three) days. 08/25/15  Yes Forest Gleason, MD  HYDROcodone-acetaminophen (NORCO/VICODIN) 5-325 MG tablet Take 1-2 tablets by mouth  every 4 (four) hours as needed for moderate pain. 08/20/15  Yes Forest Gleason, MD  mirtazapine (REMERON) 15 MG tablet Take 15 mg by mouth at bedtime.   Yes Historical Provider, MD  warfarin (COUMADIN) 5 MG tablet Take 5 mg by mouth daily.   Yes Historical Provider, MD  ondansetron (ZOFRAN) 4 MG tablet Take 1 tablet (4 mg total) by mouth 4 (four) times daily as needed for nausea or vomiting. Patient not taking: Reported on 09/07/2015 09/06/15   Forest Gleason, MD  prochlorperazine (COMPAZINE) 10 MG tablet Take 1 tablet (10 mg total) by mouth every 6 (six) hours as needed for nausea or vomiting. 09/13/15   Cammie Sickle, MD     Family History  Problem Relation Age of Onset  . Hypertension Mother   . Hypertension Father   . Heart disease Father     Social History   Social History  . Marital Status: Married    Spouse Name: N/A  . Number of Children: N/A  . Years of Education: N/A   Social History Main Topics  . Smoking status: Former Research scientist (life sciences)  . Smokeless tobacco: Not on file  . Alcohol Use: 3.5 oz/week    7 drink(s) per week  . Drug Use: Not on file  . Sexual Activity: Yes    Birth Control/ Protection: Post-menopausal   Other Topics Concern  . Not on file   Social History Narrative       Review of Systems: A 12 point ROS discussed and pertinent positives are indicated in the HPI above.  All other systems are negative.  Review of Systems  Vital Signs: BP 168/86 mmHg  Pulse 97  Temp(Src) 97.8 F (36.6 C) (Oral)  Resp 14  Ht 5\' 2"  (1.575 m)  Wt 92 lb (41.731 kg)  BMI 16.82 kg/m2  SpO2 98%  Physical Exam  Palpation of the lower back is positive for reproducible pain at the lumbar level.    Mallampati Score:  2 Imaging: Ct Chest W Contrast  09/01/2015  CLINICAL DATA:  Possible spinal lesion. Lower back pain. Metastatic cancer to spine. EXAM: CT CHEST, ABDOMEN, AND PELVIS WITH CONTRAST TECHNIQUE: Multidetector CT imaging of the chest, abdomen and pelvis was  performed following the standard protocol during bolus administration of intravenous contrast. CONTRAST:  55mL OMNIPAQUE IOHEXOL 300 MG/ML  SOLN COMPARISON:  None. FINDINGS: CT CHEST FINDINGS Mediastinum/Nodes: Sub cm low-attenuation lesions are seen in the thyroid, nonspecific. No pathologically enlarged mediastinal, hilar or axillary lymph nodes. Atherosclerotic calcification of the arterial vasculature, including extensive three-vessel involvement of the coronary arteries. Heart is at the upper limits of normal in size. No pericardial effusion. Lungs/Pleura: Biapical pleural parenchymal scarring. Few scattered calcified granulomas. Minimal scarring/volume loss in the lower lobes. No pleural fluid. Airway is unremarkable. Musculoskeletal: Mild compression of the T6 superior endplate is age-indeterminate. No definite lytic or sclerotic lesions. CT ABDOMEN PELVIS FINDINGS Hepatobiliary: Multiple  low-attenuation lesions in the liver measure up to 4.4 cm in the right hepatic lobe and are likely cysts although some are too small to characterize. Liver parenchyma is mildly heterogeneous and there may be slight marginal irregularity. Gallbladder is unremarkable. No biliary ductal dilatation. Pancreas: Negative. Spleen: Negative. Adrenals/Urinary Tract: Adrenal glands are unremarkable. Tiny low-attenuation lesions in the kidneys measure up to 9 mm on the right and are likely cysts, statistically. Bilateral extrarenal pelves. Bladder is grossly unremarkable. Stomach/Bowel: Stomach, small bowel, appendix and colon are unremarkable. Vascular/Lymphatic: Atherosclerotic calcification of the arterial vasculature without abdominal aortic aneurysm. No pathologically enlarged lymph nodes. Reproductive: Uterus and ovaries are visualized. Other: No free fluid.  Mesenteries and peritoneum are unremarkable. Musculoskeletal: Superior endplate compression deformities are seen in the L1, L4 and L5 vertebral bodies. Multilevel endplate  degenerative changes. IMPRESSION: 1. Superior endplate compression deformities involving T6, L1, L4 and L5, age indeterminate. Patient reportedly had a recent MRI, which is not available for comparison. 2. A primary malignancy is not identified in the chest, abdomen or pelvis. 3. Extensive 3 vessel coronary artery calcification. 4. Question cirrhosis. Electronically Signed   By: Lorin Picket M.D.   On: 09/01/2015 16:09   Ct Abdomen Pelvis W Contrast  09/01/2015  CLINICAL DATA:  Possible spinal lesion. Lower back pain. Metastatic cancer to spine. EXAM: CT CHEST, ABDOMEN, AND PELVIS WITH CONTRAST TECHNIQUE: Multidetector CT imaging of the chest, abdomen and pelvis was performed following the standard protocol during bolus administration of intravenous contrast. CONTRAST:  83mL OMNIPAQUE IOHEXOL 300 MG/ML  SOLN COMPARISON:  None. FINDINGS: CT CHEST FINDINGS Mediastinum/Nodes: Sub cm low-attenuation lesions are seen in the thyroid, nonspecific. No pathologically enlarged mediastinal, hilar or axillary lymph nodes. Atherosclerotic calcification of the arterial vasculature, including extensive three-vessel involvement of the coronary arteries. Heart is at the upper limits of normal in size. No pericardial effusion. Lungs/Pleura: Biapical pleural parenchymal scarring. Few scattered calcified granulomas. Minimal scarring/volume loss in the lower lobes. No pleural fluid. Airway is unremarkable. Musculoskeletal: Mild compression of the T6 superior endplate is age-indeterminate. No definite lytic or sclerotic lesions. CT ABDOMEN PELVIS FINDINGS Hepatobiliary: Multiple low-attenuation lesions in the liver measure up to 4.4 cm in the right hepatic lobe and are likely cysts although some are too small to characterize. Liver parenchyma is mildly heterogeneous and there may be slight marginal irregularity. Gallbladder is unremarkable. No biliary ductal dilatation. Pancreas: Negative. Spleen: Negative. Adrenals/Urinary Tract:  Adrenal glands are unremarkable. Tiny low-attenuation lesions in the kidneys measure up to 9 mm on the right and are likely cysts, statistically. Bilateral extrarenal pelves. Bladder is grossly unremarkable. Stomach/Bowel: Stomach, small bowel, appendix and colon are unremarkable. Vascular/Lymphatic: Atherosclerotic calcification of the arterial vasculature without abdominal aortic aneurysm. No pathologically enlarged lymph nodes. Reproductive: Uterus and ovaries are visualized. Other: No free fluid.  Mesenteries and peritoneum are unremarkable. Musculoskeletal: Superior endplate compression deformities are seen in the L1, L4 and L5 vertebral bodies. Multilevel endplate degenerative changes. IMPRESSION: 1. Superior endplate compression deformities involving T6, L1, L4 and L5, age indeterminate. Patient reportedly had a recent MRI, which is not available for comparison. 2. A primary malignancy is not identified in the chest, abdomen or pelvis. 3. Extensive 3 vessel coronary artery calcification. 4. Question cirrhosis. Electronically Signed   By: Lorin Picket M.D.   On: 09/01/2015 16:09    Labs:  CBC:  Recent Labs  08/23/15 1450  WBC 4.9  HGB 17.8*  HCT 50.7*  PLT 330    COAGS: No results  for input(s): INR, APTT in the last 8760 hours.  BMP:  Recent Labs  08/23/15 1450  NA 133*  K 3.2*  CL 94*  CO2 29  GLUCOSE 95  BUN 13  CALCIUM 8.9  CREATININE 0.59  GFRNONAA >60  GFRAA >60    LIVER FUNCTION TESTS:  Recent Labs  08/23/15 1450  BILITOT 0.9  AST 30  ALT 15  ALKPHOS 131*  PROT 8.0  ALBUMIN 4.7    TUMOR MARKERS: No results for input(s): AFPTM, CEA, CA199, CHROMGRNA in the last 8760 hours.  Assessment and Plan:  [name] is an 81 year old female with compression fractures of L4 and L5, with associated life-style limiting pain.    I have reviewed all of her imaging, including an outside MRI completed 08/13/2015.    I have spoken with her and her husband about  getting her treated, and given our discussion (documented in the HPI above) they wish to proceed with 2 level vertebral augmentation of L4 and L5.  I would suggest an intra-op bx of both levels given the MRI appearance, even though malignancy is thought to be remote possibility with the MR appearance.  It is reasonable to do the biopsy given the family concern.    They would like to be treated by Dr. Earleen Newport, and I discussed with them a time-frame for early December.  She will need to hold her coumadin with a target of ~1.4, or a bridge with lovenox, holding the last dose before the procedure.  If oral anti-coagulants are offered until the procedure (xarelto, eliquis, etc) holding at least 3 days will be required.   Thank you for this interesting consult.  I greatly enjoyed meeting [name] and look forward to participating in their care.  A copy of this report was sent to the requesting provider on this date.  SignedCorrie Mckusick 09/15/2015, 1:43 PM   I spent a total of   40 Minutes   in face to face in clinical consultation, greater than 50% of which was counseling/coordinating care for lumbar compression fractures of L4 and L5, with possible vertebral augmentation.

## 2015-09-23 ENCOUNTER — Other Ambulatory Visit (HOSPITAL_COMMUNITY): Payer: Self-pay | Admitting: Radiology

## 2015-09-23 ENCOUNTER — Other Ambulatory Visit (HOSPITAL_COMMUNITY): Payer: Self-pay | Admitting: Interventional Radiology

## 2015-09-23 DIAGNOSIS — M545 Low back pain, unspecified: Secondary | ICD-10-CM

## 2015-09-23 DIAGNOSIS — S32000A Wedge compression fracture of unspecified lumbar vertebra, initial encounter for closed fracture: Secondary | ICD-10-CM

## 2015-09-23 DIAGNOSIS — IMO0002 Reserved for concepts with insufficient information to code with codable children: Secondary | ICD-10-CM

## 2015-09-23 DIAGNOSIS — S32000S Wedge compression fracture of unspecified lumbar vertebra, sequela: Secondary | ICD-10-CM

## 2015-09-28 ENCOUNTER — Telehealth: Payer: Self-pay | Admitting: *Deleted

## 2015-09-28 MED ORDER — FENTANYL 12 MCG/HR TD PT72: 12.5000 ug | MEDICATED_PATCH | TRANSDERMAL | Status: DC

## 2015-09-28 NOTE — Telephone Encounter (Signed)
Informed that prescription is ready to pick up  

## 2015-10-06 ENCOUNTER — Other Ambulatory Visit: Payer: Self-pay | Admitting: Radiology

## 2015-10-07 ENCOUNTER — Other Ambulatory Visit: Payer: Self-pay | Admitting: Radiology

## 2015-10-08 ENCOUNTER — Ambulatory Visit (HOSPITAL_COMMUNITY)
Admission: RE | Admit: 2015-10-08 | Discharge: 2015-10-08 | Disposition: A | Discharge status: Home / Self Care | Payer: Medicare Other | Source: Ambulatory Visit | Attending: Interventional Radiology | Admitting: Interventional Radiology

## 2015-10-08 ENCOUNTER — Other Ambulatory Visit (HOSPITAL_COMMUNITY): Payer: Self-pay | Admitting: Radiology

## 2015-10-08 ENCOUNTER — Encounter (HOSPITAL_COMMUNITY): Payer: Self-pay

## 2015-10-08 DIAGNOSIS — S32000A Wedge compression fracture of unspecified lumbar vertebra, initial encounter for closed fracture: Secondary | ICD-10-CM

## 2015-10-08 DIAGNOSIS — S32040A Wedge compression fracture of fourth lumbar vertebra, initial encounter for closed fracture: Secondary | ICD-10-CM

## 2015-10-08 DIAGNOSIS — Z7901 Long term (current) use of anticoagulants: Secondary | ICD-10-CM | POA: Diagnosis not present

## 2015-10-08 DIAGNOSIS — M4856XA Collapsed vertebra, not elsewhere classified, lumbar region, initial encounter for fracture: Secondary | ICD-10-CM | POA: Insufficient documentation

## 2015-10-08 DIAGNOSIS — I4891 Unspecified atrial fibrillation: Secondary | ICD-10-CM | POA: Diagnosis not present

## 2015-10-08 LAB — CBC
HEMATOCRIT: 49.2 % — AB (ref 36.0–46.0)
Hemoglobin: 17 g/dL — ABNORMAL HIGH (ref 12.0–15.0)
MCH: 32.7 pg (ref 26.0–34.0)
MCHC: 34.6 g/dL (ref 30.0–36.0)
MCV: 94.6 fL (ref 78.0–100.0)
Platelets: 292 10*3/uL (ref 150–400)
RBC: 5.2 MIL/uL — ABNORMAL HIGH (ref 3.87–5.11)
RDW: 13.8 % (ref 11.5–15.5)
WBC: 6.6 10*3/uL (ref 4.0–10.5)

## 2015-10-08 LAB — BASIC METABOLIC PANEL
Anion gap: 15 (ref 5–15)
BUN: 9 mg/dL (ref 6–20)
CO2: 28 mmol/L (ref 22–32)
Calcium: 9.5 mg/dL (ref 8.9–10.3)
Chloride: 95 mmol/L — ABNORMAL LOW (ref 101–111)
Creatinine, Ser: 0.57 mg/dL (ref 0.44–1.00)
GFR calc Af Amer: >60 mL/min (ref >60)
GFR calc non Af Amer: >60 mL/min (ref >60)
GLUCOSE: 114 mg/dL — AB (ref 65–99)
POTASSIUM: 3.2 mmol/L — AB (ref 3.5–5.1)
Sodium: 138 mmol/L (ref 135–145)

## 2015-10-08 LAB — PROTIME-INR
INR: 0.99 (ref 0.00–1.49)
Prothrombin Time: 13.3 seconds (ref 11.6–15.2)

## 2015-10-08 LAB — APTT: APTT: 30 s (ref 24–37)

## 2015-10-08 MED ORDER — MIDAZOLAM HCL 2 MG/2ML IJ SOLN
INTRAMUSCULAR | Status: AC | PRN
Start: 2015-10-08 — End: 2015-10-08
  Administered 2015-10-08 (×8): 0.5 mg via INTRAVENOUS

## 2015-10-08 MED ORDER — FENTANYL CITRATE (PF) 100 MCG/2ML IJ SOLN
INTRAMUSCULAR | Status: AC | PRN
  Administered 2015-10-08 (×4): 25 ug via INTRAVENOUS

## 2015-10-08 MED ORDER — FENTANYL CITRATE (PF) 100 MCG/2ML IJ SOLN
INTRAMUSCULAR | Status: AC
  Filled 2015-10-08: qty 4

## 2015-10-08 MED ORDER — MIDAZOLAM HCL 2 MG/2ML IJ SOLN
INTRAMUSCULAR | Status: AC
  Filled 2015-10-08: qty 6

## 2015-10-08 MED ORDER — CEFAZOLIN SODIUM-DEXTROSE 2-3 GM-% IV SOLR
2.0000 g | INTRAVENOUS | Status: AC
  Administered 2015-10-08: 2 g via INTRAVENOUS

## 2015-10-08 MED ORDER — LIDOCAINE HCL (PF) 1 % IJ SOLN
INTRAMUSCULAR | Status: AC
  Filled 2015-10-08: qty 30

## 2015-10-08 MED ORDER — CEFAZOLIN SODIUM-DEXTROSE 2-3 GM-% IV SOLR
INTRAVENOUS | Status: AC
  Administered 2015-10-08: 2 g via INTRAVENOUS
  Filled 2015-10-08: qty 50

## 2015-10-08 MED ORDER — SODIUM CHLORIDE 0.9 % IV SOLN
Freq: Once | INTRAVENOUS | Status: AC
Start: 2015-10-08 — End: 2015-10-08
  Administered 2015-10-08: 12:00:00 via INTRAVENOUS

## 2015-10-08 NOTE — H&P (Signed)
HPI: Patient with lower back pain since August/September 2016 MRI done and reviewed by Dr. Earleen Newport Seen in full IR consult on 08/2015 Scheduled today for image guided L4 and L5 bone biopsy and vertebroplasty/kyphoplasty  The patient has had a H&P performed within the last 30 days, all history, medications, and exam have been reviewed. The patient denies any interval changes since the H&P.  Medications: Prior to Admission medications   Medication Sig Start Date End Date Taking? Authorizing Provider  amLODipine (NORVASC) 5 MG tablet Take 5 mg by mouth daily.     Yes Historical Provider, MD  mirtazapine (REMERON) 15 MG tablet Take 15 mg by mouth at bedtime.   Yes Historical Provider, MD  prochlorperazine (COMPAZINE) 10 MG tablet Take 1 tablet (10 mg total) by mouth every 6 (six) hours as needed for nausea or vomiting. 09/13/15  Yes Cammie Sickle, MD  fentaNYL (DURAGESIC - DOSED MCG/HR) 12 MCG/HR Place 1 patch (12.5 mcg total) onto the skin every 3 (three) days. 09/28/15   Forest Gleason, MD  HYDROcodone-acetaminophen (NORCO/VICODIN) 5-325 MG tablet Take 1-2 tablets by mouth every 4 (four) hours as needed for moderate pain. 08/20/15   Forest Gleason, MD  ondansetron (ZOFRAN) 4 MG tablet Take 1 tablet (4 mg total) by mouth 4 (four) times daily as needed for nausea or vomiting. Patient not taking: Reported on 09/07/2015 09/06/15   Forest Gleason, MD  warfarin (COUMADIN) 5 MG tablet Take 5 mg by mouth daily.    Historical Provider, MD     Vital Signs: BP 159/103 mmHg  Pulse 108  Temp(Src) 97.9 F (36.6 C) (Oral)  Resp 18  Ht 5\' 2"  (1.575 m)  Wt 92 lb (41.731 kg)  BMI 16.82 kg/m2  SpO2 95%  Physical Exam  Constitutional: She is oriented to person, place, and time. No distress.  HENT:  Head: Normocephalic and atraumatic.  Cardiovascular: Exam reveals no gallop and no friction rub.   No murmur heard. Tachycardic  Pulmonary/Chest: Effort normal and breath sounds normal. No respiratory  distress. She has no wheezes. She has no rales.  Abdominal: Soft. Bowel sounds are normal.  Musculoskeletal:  TTP lower lumbar region  Neurological: She is alert and oriented to person, place, and time.  Skin: Skin is warm and dry. She is not diaphoretic.    Mallampati Score:  MD Evaluation Airway: WNL Heart: WNL Abdomen: WNL Chest/ Lungs: WNL ASA  Classification: 3 Mallampati/Airway Score: Two  Labs:  CBC:  Recent Labs  08/23/15 1450 10/08/15 1200  WBC 4.9 6.6  HGB 17.8* 17.0*  HCT 50.7* 49.2*  PLT 330 292    COAGS:  Recent Labs  10/08/15 1200  INR 0.99  APTT 30    BMP:  Recent Labs  08/23/15 1450 10/08/15 1200  NA 133* 138  K 3.2* 3.2*  CL 94* 95*  CO2 29 28  GLUCOSE 95 114*  BUN 13 9  CALCIUM 8.9 9.5  CREATININE 0.59 0.57  GFRNONAA >60 >60  GFRAA >60 >60    LIVER FUNCTION TESTS:  Recent Labs  08/23/15 1450  BILITOT 0.9  AST 30  ALT 15  ALKPHOS 131*  PROT 8.0  ALBUMIN 4.7    Assessment/Plan:  Lower back pain, uncontrolled- no known injury since August/September 2016 MRI done and reviewed by Dr. Earleen Newport MRI prompted a referral to Dr. Oliva Bustard with concerns for malignancy, no evidence of malignancy based on Dr. Pasty Arch review Seen in full IR consult on 08/2015 Scheduled today for image guided  L4 and L5 bone biopsy and vertebroplasty/kyphoplasty with sedation The patient has been NPO, no blood thinners taken, labs and vitals have been reviewed. Risks and Benefits discussed with the patient including, but not limited to education regarding the natural healing process of compression fractures without intervention, bleeding, infection, cement migration which may cause spinal cord damage, paralysis, pulmonary embolism or even death. All of the patient's questions were answered, patient is agreeable to proceed. Consent signed and in chart. Atrial fibrillation on coumadin- held    Signed: Hedy Jacob 10/08/2015, 1:13 PM

## 2015-10-08 NOTE — Procedures (Signed)
Interventional Radiology Procedure Note  Procedure:  Bipedicular approach kyphoplasty of L4 & L5 compression fractures.  Biopsy of L4 & L5.   Complications: None Recommendations:  - Ok to shower tomorrow - Do not submerge for 7 days - Routine care  - Follow up in [place] clinic in 1-2 months with Dr. Earleen Newport  Signed,  Dulcy Fanny. Earleen Newport, DO

## 2015-10-08 NOTE — Discharge Instructions (Signed)
KYPHOPLASTY/VERTEBROPLASTY DISCHARGE INSTRUCTIONS  Medications: (check all that apply)     Resume all home medications as before procedure.       Resume your (aspirin/Plavix/Coumadin) on                  Continue your pain medications as prescribed as needed.  Over the next 3-5 days, decrease your pain medication as tolerated.  Over the counter medications (i.e. Tylenol, ibuprofen, and aleve) may be substituted once severe/moderate pain symptoms have subsided.   Wound Care: Bandages may be removed the day following your procedure.  You may get your incision wet once bandages are removed.  Bandaids may be used to cover the incisions until scab formation.  Topical ointments are optional.  If you develop a fever greater than 101 degrees, have increased skin redness at the incision sites or pus-like oozing from incisions occurring within 1 week of the procedure, contact radiology at [phone] or [phone].  Ice pack to back for 15-20 minutes 2-3 time per day for first 2-3 days post procedure.  The ice will expedite muscle healing and help with the pain from the incisions.   Activity: Bedrest today with limited activity for 24 hours post procedure.  No driving for 48 hours.  Increase your activity as tolerated after bedrest (with assistance if necessary).  Refrain from any strenuous activity or heavy lifting (greater than 10 lbs.).   Follow up: Contact radiology at [phone] or [phone] if any questions/concerns.  A physician assistant from radiology will contact you in approximately 1 week.  If a biopsy was performed at the time of your procedure, your referring physician should receive the results in usually 2-3 days.         

## 2015-10-11 ENCOUNTER — Other Ambulatory Visit: Payer: Self-pay | Admitting: Interventional Radiology

## 2015-10-11 DIAGNOSIS — S32058A Other fracture of fifth lumbar vertebra, initial encounter for closed fracture: Secondary | ICD-10-CM

## 2015-10-11 DIAGNOSIS — S32048A Other fracture of fourth lumbar vertebra, initial encounter for closed fracture: Secondary | ICD-10-CM

## 2015-12-02 ENCOUNTER — Ambulatory Visit
Admission: RE | Admit: 2015-12-02 | Discharge: 2015-12-02 | Disposition: A | Discharge status: Home / Self Care | Payer: Medicare Other | Source: Ambulatory Visit | Attending: Interventional Radiology | Admitting: Interventional Radiology

## 2015-12-02 ENCOUNTER — Other Ambulatory Visit: Payer: Self-pay | Admitting: Interventional Radiology

## 2015-12-02 DIAGNOSIS — S32058A Other fracture of fifth lumbar vertebra, initial encounter for closed fracture: Secondary | ICD-10-CM

## 2015-12-02 DIAGNOSIS — S32048A Other fracture of fourth lumbar vertebra, initial encounter for closed fracture: Secondary | ICD-10-CM

## 2015-12-02 NOTE — Progress Notes (Signed)
Chief Complaint: Follow up vertebral augmentation  Referring Physician(s): Dr. Oliva Bustard  History of Present Illness: [name] is a 82 y.o. female presenting today as a scheduled follow-up, status post vertebral augmentation of L4 and L5 compression fracture.  She was seen originally 09/07/2015, having had a previous evaluation by spine surgery in [facility]. A concern at that time for malignancy prompted a referral to Dr. Metro Kung office. It was determined that the patient has no concern for malignancy, and the compression fractures at L4 and L5 were treated 10/08/2015 with bone marrow biopsy confirming no presence of tumor.  The patient has had a noticeable decrease in her pain, when the pain was originally a 7/10-8/10 in intensity, now is decreased to a level of 3/10 at its best. She has stopped taking any narcotic pain medications, and only takes Tylenol in the morning upon waking. She reports having better ambulation, and is able to go to the gym and exercise. Currently she is only biking on a stationary bike, though finds this comfortable. She is able to complete all of her other tasks of daily living.  Her husband does report that she is somewhat unsteady, and he has some fear for her walking on a treadmill for weightbearing exercise. She has not yet had any physical therapy for rehabilitation.  Overall they seem satisfied with the results, having had some resolution of her pain symptoms. In fact she says she is quite comfortable.  Past Medical History  Diagnosis Date  . Hypertension   . 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 Meniere's disease   . Irregular heart beat   . Memory difficulty   . Atrial fibrillation [facility])     Past Surgical History  Procedure Laterality Date  . Mastoidectomy    . Squamous cell excised  2010    Ear  . Menie[facility] surg    . Tonsillectomy      Allergies: Review of patient's allergies indicates no known  allergies.  Medications: Prior to Admission medications   Medication Sig Start Date End Date Taking? Authorizing Provider  acetaminophen (TYLENOL) 500 MG tablet Take 500 mg by mouth every 6 (six) hours as needed. Takes 2 tabs po Q am.   Yes Historical Provider, MD  amLODipine (NORVASC) 5 MG tablet Take 5 mg by mouth daily.     Yes Historical Provider, MD  mirtazapine (REMERON) 15 MG tablet Take 15 mg by mouth at bedtime.   Yes Historical Provider, MD  warfarin (COUMADIN) 5 MG tablet Take 5 mg by mouth daily.   Yes Historical Provider, MD  fentaNYL (DURAGESIC - DOSED MCG/HR) 12 MCG/HR Place 1 patch (12.5 mcg total) onto the skin every 3 (three) days. Patient not taking: Reported on 12/02/2015 09/28/15   Forest Gleason, MD  HYDROcodone-acetaminophen (NORCO/VICODIN) 5-325 MG tablet Take 1-2 tablets by mouth every 4 (four) hours as needed for moderate pain. Patient not taking: Reported on 12/02/2015 08/20/15   Forest Gleason, MD  ondansetron (ZOFRAN) 4 MG tablet Take 1 tablet (4 mg total) by mouth 4 (four) times daily as needed for nausea or vomiting. Patient not taking: Reported on 12/02/2015 09/06/15   Forest Gleason, MD  prochlorperazine (COMPAZINE) 10 MG tablet Take 1 tablet (10 mg total) by mouth every 6 (six) hours as needed for nausea or vomiting. Patient not taking: Reported on 12/02/2015 09/13/15   Cammie Sickle, MD     Family History  Problem Relation Age of Onset  .  Hypertension Mother   . Hypertension Father   . Heart disease Father     Social History   Social History  . Marital Status: Married    Spouse Name: N/A  . Number of Children: N/A  . Years of Education: N/A   Social History Main Topics  . Smoking status: Former Research scientist (life sciences)  . Smokeless tobacco: Not on file  . Alcohol Use: 3.5 oz/week    7 drink(s) per week  . Drug Use: Not on file  . Sexual Activity: Yes    Birth Control/ Protection: Post-menopausal   Other Topics Concern  . Not on file   Social History Narrative     Review of Systems: A 12 point ROS discussed and pertinent positives are indicated in the HPI above.  All other systems are negative.  Review of Systems  Vital Signs: BP 162/99 mmHg  Pulse 86  Temp(Src) 97.8 F (36.6 C) (Oral)  Resp 90  Ht 5' 3" (1.6 m)  Wt 92 lb (41.731 kg)  BMI 16.30 kg/m2  SpO2 95%  Physical Exam  No reproducible pain of her lower back. Comfortable sitting and standing position.   Imaging: Dg Lumbar Spine 2-3 Views  12/02/2015  CLINICAL DATA:  82 year old female with a history of osteopenia, L4-L5 compression fractures, treated with vertebral augmentation 10/08/2015. On going 3/10 intensity pain. EXAM: LUMBAR SPINE - 2-3 VIEW COMPARISON:  10/08/2015, CT 09/01/2015 FINDINGS: Lumbar Spine: Lumbar vertebral elements maintain normal alignment without evidence of anterolisthesis. Mild kyphotic angulation at the thoracolumbar transition. Treatment changes of prior vertebral augmentation at the L4 and L5 levels, similar configuration to the comparison study. No new fracture line identified within the visualized lower thoracic and upper lumbar levels. Multilevel degenerative disc disease with disc space narrowing, vacuum disc phenomenon, and endplate sclerosis. Configuration the vertebral bodies is relatively unchanged. No fracture line identified. Multi level facet disease which is most pronounced at the lower levels, L4-L5 and L5-S1. Atherosclerotic changes of the abdominal vasculature. Marland Kitchen IMPRESSION: Status post L4-L5 vertebral augmentation, with similar configuration to the treatment day. No plain film evidence of new acute fracture or vertebral body height loss. Multilevel degenerative disc disease with vacuum disc phenomenon and endplate changes at multiple levels. Facet disease worst at the L4-L5 and L5-S1 levels. Signed, Dulcy Fanny. Earleen Newport, DO [facility] Electronically Signed   By: Corrie Mckusick D.O.   On:  12/02/2015 12:40    Labs:  CBC:  Recent Labs  08/23/15 1450 10/08/15 1200  WBC 4.9 6.6  HGB 17.8* 17.0*  HCT 50.7* 49.2*  PLT 330 292    COAGS:  Recent Labs  10/08/15 1200  INR 0.99  APTT 30    BMP:  Recent Labs  08/23/15 1450 10/08/15 1200  NA 133* 138  K 3.2* 3.2*  CL 94* 95*  CO2 29 28  GLUCOSE 95 114*  BUN 13 9  CALCIUM 8.9 9.5  CREATININE 0.59 0.57  GFRNONAA >60 >60  GFRAA >60 >60    LIVER FUNCTION TESTS:  Recent Labs  08/23/15 1450  BILITOT 0.9  AST 30  ALT 15  ALKPHOS 131*  PROT 8.0  ALBUMIN 4.7    TUMOR MARKERS: No results for input(s): AFPTM, CEA, CA199, CHROMGRNA in the last 8760 hours.  Assessment and Plan:  Ms First is an 82 year old female status post vertebral augmentation with kyphoplasty of L4 and L5 performed 10/08/2015 for lifestyle limiting pain of her lower extremity. She is satisfied that she has  had quite a good result, although has some ongoing soreness. For this reason we performed a plain film of her lumbar spine which demonstrates similar kyphoplasty results and no evidence of a new compression fracture.  I had a long discussion about the goals of our therapy with her and her husband, and they seems satisfied with the outcome. He would like for her to initiate physical therapy, which I think is appropriate and safe at this point. I have written them a prescription to initiate physical therapy. I have also discussed with her her risk factors for development of additional compression fractures given her demographic, and I suggested that they could discuss testing for osteopenia/osteoporosis with her primary physician if this has not already been performed.  In addition I did discuss with them the possibility that targeted ESI may be a reasonable thing to consider. If they feel that this may be beneficial, they may call at any point for a referral. We are happy to see her on an as-needed basis, and they  understand.   Electronically Signed: Corrie Mckusick 12/02/2015, 2:36 PM   I spent a total of    25 minutes in face to face in clinical consultation, greater than 50% of which was counseling/coordinating care for L4 and L5 compression fracture, status post vertebral augmentation.

## 2016-02-16 ENCOUNTER — Other Ambulatory Visit (HOSPITAL_COMMUNITY): Payer: Self-pay | Admitting: Interventional Radiology

## 2016-02-16 DIAGNOSIS — S32058A Other fracture of fifth lumbar vertebra, initial encounter for closed fracture: Secondary | ICD-10-CM

## 2016-02-16 DIAGNOSIS — S32048A Other fracture of fourth lumbar vertebra, initial encounter for closed fracture: Secondary | ICD-10-CM

## 2016-02-29 ENCOUNTER — Other Ambulatory Visit (HOSPITAL_COMMUNITY): Payer: Self-pay | Admitting: Interventional Radiology

## 2016-02-29 ENCOUNTER — Ambulatory Visit
Admission: RE | Admit: 2016-02-29 | Discharge: 2016-02-29 | Disposition: A | Discharge status: Home / Self Care | Payer: Medicare Other | Source: Ambulatory Visit | Attending: Interventional Radiology | Admitting: Interventional Radiology

## 2016-02-29 DIAGNOSIS — S32058A Other fracture of fifth lumbar vertebra, initial encounter for closed fracture: Secondary | ICD-10-CM

## 2016-02-29 DIAGNOSIS — S22000A Wedge compression fracture of unspecified thoracic vertebra, initial encounter for closed fracture: Secondary | ICD-10-CM

## 2016-02-29 DIAGNOSIS — S32048A Other fracture of fourth lumbar vertebra, initial encounter for closed fracture: Secondary | ICD-10-CM

## 2016-02-29 HISTORY — PX: IR GENERIC HISTORICAL: IMG1180011

## 2016-02-29 NOTE — Progress Notes (Signed)
Patient ID: [name], female   DOB: 22-May-1926, 81 y.o.   MRN: [id number]       Chief Complaint: Patient was seen in consultation today for  Chief Complaint  Patient presents with  . Follow-up    5 mo follow up Kyphoplasty:  c/o pain upper back     at the request of Darian Ace  Referring Physician(s): Ruger Saxer  History of Present Illness: [name] is a 82 y.o. female who underwent L4 and L5 kyphoplasty December of last year. At that time she also had an L1 compression which had healed. She had a T6 compression fracture by CT only. She has done very well since then but recently over the last 2 months has developed increasing midline upper thoracic pain at the T7 level. She was ill 2 months ago and sneezed and coughed quite a bit. The pain began after that event. She denies any falls. She denies any weakness or numbness. She is still quite active and works out on [employer]. She is not on narcotics. She denies pain, however her family describes episodes of severe debilitating pain that she seems to forget.  Past Medical History  Diagnosis Date  . Hypertension   . 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 Meniere's disease   . Irregular heart beat   . Memory difficulty   . Atrial fibrillation [facility])     Past Surgical History  Procedure Laterality Date  . Mastoidectomy    . Squamous cell excised  2010    Ear  . [facility] surg    . Tonsillectomy      Allergies: Review of patient's allergies indicates no known allergies.  Medications: Prior to Admission medications   Medication Sig Start Date End Date Taking? Authorizing Provider  acetaminophen (TYLENOL) 500 MG tablet Take 500 mg by mouth every 6 (six) hours as needed. Takes 2 tabs po Q am.   Yes Historical Provider, MD  amLODipine (NORVASC) 5 MG tablet Take 5 mg by mouth daily.     Yes Historical Provider, MD  mirtazapine (REMERON) 15 MG tablet Take 15 mg by mouth at bedtime.   Yes Historical Provider, MD    warfarin (COUMADIN) 5 MG tablet Take 5 mg by mouth daily. Reported on 02/29/2016   Yes Historical Provider, MD  fentaNYL (DURAGESIC - DOSED MCG/HR) 12 MCG/HR Place 1 patch (12.5 mcg total) onto the skin every 3 (three) days. Patient not taking: Reported on 12/02/2015 09/28/15   Forest Gleason, MD  HYDROcodone-acetaminophen (NORCO/VICODIN) 5-325 MG tablet Take 1-2 tablets by mouth every 4 (four) hours as needed for moderate pain. Patient not taking: Reported on 12/02/2015 08/20/15   Forest Gleason, MD  ondansetron (ZOFRAN) 4 MG tablet Take 1 tablet (4 mg total) by mouth 4 (four) times daily as needed for nausea or vomiting. Patient not taking: Reported on 12/02/2015 09/06/15   Forest Gleason, MD  prochlorperazine (COMPAZINE) 10 MG tablet Take 1 tablet (10 mg total) by mouth every 6 (six) hours as needed for nausea or vomiting. Patient not taking: Reported on 12/02/2015 09/13/15   Cammie Sickle, MD     Family History  Problem Relation Age of Onset  . Hypertension Mother   . Hypertension Father   . Heart disease Father     Social History   Social History  . Marital Status: Married    Spouse Name: N/A  . Number of Children: N/A  . Years of Education: N/A  Social History Main Topics  . Smoking status: Former Research scientist (life sciences)  . Smokeless tobacco: Not on file  . Alcohol Use: 3.5 oz/week    7 drink(s) per week  . Drug Use: Not on file  . Sexual Activity: Yes    Birth Control/ Protection: Post-menopausal   Other Topics Concern  . Not on file   Social History Narrative     Review of Systems: A 12 point ROS discussed and pertinent positives are indicated in the HPI above.  All other systems are negative.  Review of Systems  Vital Signs: BP 153/92 mmHg  Pulse 100  Temp(Src) 98.1 F (36.7 C) (Oral)  Resp 15  SpO2 98%  Physical Exam  Constitutional: She is oriented to person, place, and time. She appears well-developed and well-nourished.  Cardiovascular:  Irregular rhythm consistent with  atrial fibrillation.  Pulmonary/Chest: Effort normal.  Musculoskeletal:  There is no midline tenderness along her thoracic spine.  Neurological: She is alert and oriented to person, place, and time.  Motor and sensation are 5 out of 5 throughout the upper and lower extremities.  Skin: Skin is warm and dry.  Psychiatric: She has a normal mood and affect.    Mallampati Score:     Imaging: Dg Thoracic Spine W/swimmers  02/29/2016  CLINICAL DATA:  Back pain EXAM: THORACIC SPINE - 3 VIEWS COMPARISON:  09/01/2015 FINDINGS: The T6 compression deformity is stable and has most likely healed. There is a new T7 compression fracture. There is also a slight compression deformity at T8. At T7, there is 30% loss of height anteriorly. At T8, there is 10% loss of height anteriorly. There is anatomic alignment. Osteopenia. IMPRESSION: Stable T6 compression deformity. New compression fractures have developed at T7 and T8. MRI is recommended. Electronically Signed   By: Marybelle Killings M.D.   On: 02/29/2016 14:42    Labs:  CBC:  Recent Labs  08/23/15 1450 10/08/15 1200  WBC 4.9 6.6  HGB 17.8* 17.0*  HCT 50.7* 49.2*  PLT 330 292    COAGS:  Recent Labs  10/08/15 1200  INR 0.99  APTT 30    BMP:  Recent Labs  08/23/15 1450 10/08/15 1200  NA 133* 138  K 3.2* 3.2*  CL 94* 95*  CO2 29 28  GLUCOSE 95 114*  BUN 13 9  CALCIUM 8.9 9.5  CREATININE 0.59 0.57  GFRNONAA >60 >60  GFRAA >60 >60    LIVER FUNCTION TESTS:  Recent Labs  08/23/15 1450  BILITOT 0.9  AST 30  ALT 15  ALKPHOS 131*  PROT 8.0  ALBUMIN 4.7    TUMOR MARKERS: No results for input(s): AFPTM, CEA, CA199, CHROMGRNA in the last 8760 hours.  Assessment and Plan:  Mr. Kalman Shan is most likely suffering from new thoracic spine fractures at T7 and T8. An MRI is recommended and will be scheduled. Based on that, we will decide if she is a candidate for thoracic spine kyphoplasty. If the MRI is negative, other sources of  upper thoracic pain can be pursued.  Thank you for this interesting consult.  I greatly enjoyed meeting [name] and look forward to participating in their care.  A copy of this report was sent to the requesting provider on this date.  Electronically Signed: Xavius Spadafore, ART A 02/29/2016, 3:00 PM   I spent a total of 15 Minutes in face to face in clinical consultation, greater than 50% of which was counseling/coordinating care for thoracic spine kyphoplasty.

## 2016-03-02 ENCOUNTER — Ambulatory Visit
Admission: RE | Admit: 2016-03-02 | Discharge: 2016-03-02 | Disposition: A | Discharge status: Home / Self Care | Payer: Medicare Other | Source: Ambulatory Visit | Attending: Interventional Radiology | Admitting: Interventional Radiology

## 2016-03-02 DIAGNOSIS — S22060G Wedge compression fracture of T7-T8 vertebra, subsequent encounter for fracture with delayed healing: Secondary | ICD-10-CM

## 2016-03-02 DIAGNOSIS — S22000A Wedge compression fracture of unspecified thoracic vertebra, initial encounter for closed fracture: Secondary | ICD-10-CM | POA: Diagnosis present

## 2016-03-02 DIAGNOSIS — J90 Pleural effusion, not elsewhere classified: Secondary | ICD-10-CM | POA: Insufficient documentation

## 2016-03-02 DIAGNOSIS — T148XXA Other injury of unspecified body region, initial encounter: Secondary | ICD-10-CM

## 2016-03-02 DIAGNOSIS — M4854XA Collapsed vertebra, not elsewhere classified, thoracic region, initial encounter for fracture: Secondary | ICD-10-CM | POA: Insufficient documentation

## 2016-03-02 DIAGNOSIS — S22000G Wedge compression fracture of unspecified thoracic vertebra, subsequent encounter for fracture with delayed healing: Secondary | ICD-10-CM

## 2016-03-02 DIAGNOSIS — M47814 Spondylosis without myelopathy or radiculopathy, thoracic region: Secondary | ICD-10-CM | POA: Diagnosis not present

## 2016-03-17 ENCOUNTER — Other Ambulatory Visit: Payer: Self-pay | Admitting: General Surgery

## 2016-03-17 ENCOUNTER — Other Ambulatory Visit: Payer: Self-pay | Admitting: Radiology

## 2016-03-21 ENCOUNTER — Ambulatory Visit (HOSPITAL_COMMUNITY)
Admission: RE | Admit: 2016-03-21 | Discharge: 2016-03-21 | Disposition: A | Discharge status: Home / Self Care | Payer: Medicare Other | Source: Ambulatory Visit | Attending: Interventional Radiology | Admitting: Interventional Radiology

## 2016-03-21 ENCOUNTER — Other Ambulatory Visit (HOSPITAL_COMMUNITY): Payer: Self-pay | Admitting: Interventional Radiology

## 2016-03-21 ENCOUNTER — Other Ambulatory Visit: Payer: Self-pay | Admitting: Interventional Radiology

## 2016-03-21 ENCOUNTER — Encounter (HOSPITAL_COMMUNITY): Payer: Self-pay

## 2016-03-21 DIAGNOSIS — M810 Age-related osteoporosis without current pathological fracture: Secondary | ICD-10-CM | POA: Diagnosis not present

## 2016-03-21 DIAGNOSIS — I10 Essential (primary) hypertension: Secondary | ICD-10-CM | POA: Diagnosis not present

## 2016-03-21 DIAGNOSIS — Z79891 Long term (current) use of opiate analgesic: Secondary | ICD-10-CM | POA: Insufficient documentation

## 2016-03-21 DIAGNOSIS — M549 Dorsalgia, unspecified: Secondary | ICD-10-CM | POA: Diagnosis present

## 2016-03-21 DIAGNOSIS — M5489 Other dorsalgia: Secondary | ICD-10-CM

## 2016-03-21 DIAGNOSIS — Z87891 Personal history of nicotine dependence: Secondary | ICD-10-CM | POA: Diagnosis not present

## 2016-03-21 DIAGNOSIS — S22060G Wedge compression fracture of T7-T8 vertebra, subsequent encounter for fracture with delayed healing: Secondary | ICD-10-CM

## 2016-03-21 DIAGNOSIS — M4854XA Collapsed vertebra, not elsewhere classified, thoracic region, initial encounter for fracture: Secondary | ICD-10-CM | POA: Diagnosis not present

## 2016-03-21 DIAGNOSIS — T148XXA Other injury of unspecified body region, initial encounter: Secondary | ICD-10-CM

## 2016-03-21 DIAGNOSIS — S22000G Wedge compression fracture of unspecified thoracic vertebra, subsequent encounter for fracture with delayed healing: Secondary | ICD-10-CM

## 2016-03-21 DIAGNOSIS — M546 Pain in thoracic spine: Secondary | ICD-10-CM

## 2016-03-21 DIAGNOSIS — Z79899 Other long term (current) drug therapy: Secondary | ICD-10-CM | POA: Insufficient documentation

## 2016-03-21 DIAGNOSIS — I4891 Unspecified atrial fibrillation: Secondary | ICD-10-CM | POA: Diagnosis not present

## 2016-03-21 DIAGNOSIS — Z7901 Long term (current) use of anticoagulants: Secondary | ICD-10-CM | POA: Insufficient documentation

## 2016-03-21 LAB — CBC
HEMATOCRIT: 47.5 % — AB (ref 36.0–46.0)
Hemoglobin: 17.1 g/dL — ABNORMAL HIGH (ref 12.0–15.0)
MCH: 33.3 pg (ref 26.0–34.0)
MCHC: 36 g/dL (ref 30.0–36.0)
MCV: 92.6 fL (ref 78.0–100.0)
PLATELETS: 241 10*3/uL (ref 150–400)
RBC: 5.13 MIL/uL — ABNORMAL HIGH (ref 3.87–5.11)
RDW: 13.3 % (ref 11.5–15.5)
WBC: 4.8 10*3/uL (ref 4.0–10.5)

## 2016-03-21 LAB — APTT: APTT: 32 s (ref 24–37)

## 2016-03-21 LAB — PROTIME-INR
INR: 1.15 (ref 0.00–1.49)
Prothrombin Time: 14.9 seconds (ref 11.6–15.2)

## 2016-03-21 MED ORDER — FENTANYL CITRATE (PF) 100 MCG/2ML IJ SOLN
INTRAMUSCULAR | Status: AC
  Filled 2016-03-21: qty 4

## 2016-03-21 MED ORDER — MIDAZOLAM HCL 2 MG/2ML IJ SOLN
INTRAMUSCULAR | Status: AC
  Filled 2016-03-21: qty 6

## 2016-03-21 MED ORDER — LIDOCAINE HCL (PF) 1 % IJ SOLN
INTRAMUSCULAR | Status: AC
  Filled 2016-03-21: qty 30

## 2016-03-21 MED ORDER — LIDOCAINE HCL (PF) 1 % IJ SOLN
INTRAMUSCULAR | Status: AC | PRN
  Administered 2016-03-21: 10 mL

## 2016-03-21 MED ORDER — MIDAZOLAM HCL 2 MG/2ML IJ SOLN
INTRAMUSCULAR | Status: AC | PRN
  Administered 2016-03-21: 0.5 mg via INTRAVENOUS
  Administered 2016-03-21: 1 mg via INTRAVENOUS
  Administered 2016-03-21: 0.5 mg via INTRAVENOUS
  Administered 2016-03-21 (×3): 1 mg via INTRAVENOUS

## 2016-03-21 MED ORDER — CEFAZOLIN SODIUM-DEXTROSE 2-4 GM/100ML-% IV SOLN
2.0000 g | INTRAVENOUS | Status: AC
  Administered 2016-03-21: 2 g via INTRAVENOUS
  Filled 2016-03-21: qty 100

## 2016-03-21 MED ORDER — SODIUM CHLORIDE 0.9 % IV SOLN
INTRAVENOUS | Status: DC
  Administered 2016-03-21: 10:00:00 via INTRAVENOUS

## 2016-03-21 MED ORDER — FENTANYL CITRATE (PF) 100 MCG/2ML IJ SOLN
INTRAMUSCULAR | Status: AC | PRN
Start: 2016-03-21 — End: 2016-03-21
  Administered 2016-03-21: 50 ug via INTRAVENOUS
  Administered 2016-03-21: 25 ug via INTRAVENOUS

## 2016-03-21 NOTE — Discharge Instructions (Signed)
Balloon Kyphoplasty Balloon kyphoplasty is a procedure to treat a spinal compression fracture, which is a collapse of the bones that form the spine (vertebrae). With this type of fracture, the vertebrae become squashed (compressed) into a wedge shape, and this causes pain. In this procedure, the collapsed vertebrae are expanded with a balloon, and bone cement is injected into them to strengthen them. LET [facility] CARE PROVIDER KNOW ABOUT:  Any allergies you have.  All medicines you are taking, including vitamins, herbs, eye drops, creams, and over-the-counter medicines.  Previous problems you or members of your family have had with the use of anesthetics.  Any blood disorders you have.  Previous surgeries you have had.  Any medical conditions you have.  Whether you are pregnant or may be pregnant. RISKS AND COMPLICATIONS Generally, this is a safe procedure. However, problems may occur, including:  Infection.  Bleeding.  Allergic reactions to medicines.  Damage to other structures or organs.  Leaking of bone cement into other parts of the body. BEFORE THE PROCEDURE  Follow instructions from your health care provider about eating or drinking restrictions.  Ask your health care provider about:  Changing or stopping your regular medicines. This is especially important if you are taking diabetes medicines or blood thinners.  Taking medicines such as aspirin and ibuprofen. These medicines can thin your blood. Do not take these medicines before your procedure if your health care provider instructs you not to.  Ask your health care provider how your surgical site will be marked or identified.  You may be given antibiotic medicine to help prevent infection.  Do not use tobacco products, including cigarettes, chewing tobacco, or e-cigarettes. If you need help quitting, ask your health care provider.  Plan to have someone take you home after the procedure.  If you go home right  after the procedure, plan to have someone with you for 24 hours. PROCEDURE  To reduce your risk of infection:  Your health care team will wash or sanitize their hands.  Your skin will be washed with soap.  An IV tube will be inserted into one of your veins.  You will be given one or more of the following:  A medicine to help you relax (sedative).  A medicine to numb the area (local anesthetic).  A medicine to make you fall asleep (general anesthetic).  Your surgeon will use an X-ray machine to see your spinal compression fracture.  Two small incisions will be made near your spine.  A thin tube will be inserted into your spine. Through this tube, the balloon will be placed in your spine where the fractures are.  The balloon will be inflated. This will create space and push the bone back toward its normal height and shape.  The balloon will be removed.  The newly created space in your spine will be filled with bone cement.  When the cement hardens, the tube in your spine will be removed.  Your incisions will be closed with stitches (sutures), skin glue, or adhesive strips.  A bandage (dressing) may be used to cover your incisions. The procedure may vary among health care providers and hospitals. AFTER THE PROCEDURE  Your blood pressure, heart rate, breathing rate, and blood oxygen level will be monitored often until the medicines you were given have worn off.  You will have some pain. Pain medicine will be available to help you.   This information is not intended to replace advice given to you by your health  care provider. Make sure you discuss any questions you have with your health care provider.   Document Released: 09/14/2004 Document Revised: 06/30/2015 Document Reviewed: 02/01/2015 Elsevier Interactive Patient Education 2016 Elsevier Inc. Percutaneous Vertebroplasty, Care After These instructions give you information on caring for yourself after your procedure.  Your doctor may also give you more specific instructions. Call your doctor if you have any problems or questions after your procedure. HOME CARE  Take medicine as told by your doctor.  Keep your wound dry and covered for 24 hours or as told by your doctor.  Ask your doctor when you can bathe or shower.  Put an ice pack on your wound.  Put ice in a plastic bag.  Place a towel between your skin and the bag.  Leave the ice on for 15-20 minutes, 3-4 times a day.  Rest in your bed for 24 hours or as told by your doctor.  Return to normal activities as told by your doctor.  Ask your doctor what stretches and exercises you can do.  Do not bend or lift anything heavy as told by your doctor. GET HELP IF:  Your wound becomes red, puffy (swollen), or tender to the touch.  You are bleeding or leaking fluid from the wound.  You are sick to your stomach (nauseous) or throw up (vomit) for more than 24 hours after the procedure.  Your back pain does not get better.  You have a fever. GET HELP RIGHT AWAY IF:   You have bad back pain that comes on suddenly.  You cannot control when you pee (urinate) or poop (bowel movement).  You lose feeling (numbness) or have tingling in your legs or feet, or they become weak.  You have sudden weakness in your arms or legs.  You have shooting pain down your legs.  You have chest pain or a hard time breathing.  You feel dizzy or pass out (faint).  Your vision changes or you cannot talk as you normally do.   This information is not intended to replace advice given to you by your health care provider. Make sure you discuss any questions you have with your health care provider.   Document Released: 01/03/2010 Document Revised: 10/14/2013 Document Reviewed: 06/17/2013 Elsevier Interactive Patient Education 2016 Elsevier Inc. Moderate Conscious Sedation, Adult, Care After Refer to this sheet in the next few weeks. These instructions provide you  with information on caring for yourself after your procedure. Your health care provider may also give you more specific instructions. Your treatment has been planned according to current medical practices, but problems sometimes occur. Call your health care provider if you have any problems or questions after your procedure. WHAT TO EXPECT AFTER THE PROCEDURE  After your procedure:  You may feel sleepy, clumsy, and have poor balance for several hours.  Vomiting may occur if you eat too soon after the procedure. HOME CARE INSTRUCTIONS  Do not participate in any activities where you could become injured for at least 24 hours. Do not:  Drive.  Swim.  Ride a bicycle.  Operate heavy machinery.  Cook.  Use power tools.  Climb ladders.  Work from a high place.  Do not make important decisions or sign legal documents until you are improved.  If you vomit, drink water, juice, or soup when you can drink without vomiting. Make sure you have little or no nausea before eating solid foods.  Only take over-the-counter or prescription medicines for pain, discomfort, or fever as  directed by your health care provider.  Make sure you and your family fully understand everything about the medicines given to you, including what side effects may occur.  You should not drink alcohol, take sleeping pills, or take medicines that cause drowsiness for at least 24 hours.  If you smoke, do not smoke without supervision.  If you are feeling better, you may resume normal activities 24 hours after you were sedated.  Keep all appointments with your health care provider. SEEK MEDICAL CARE IF:  Your skin is pale or bluish in color.  You continue to feel nauseous or vomit.  Your pain is getting worse and is not helped by medicine.  You have bleeding or swelling.  You are still sleepy or feeling clumsy after 24 hours. SEEK IMMEDIATE MEDICAL CARE IF:  You develop a rash.  You have difficulty  breathing.  You develop any type of allergic problem.  You have a fever. MAKE SURE YOU:  Understand these instructions.  Will watch your condition.  Will get help right away if you are not doing well or get worse.   This information is not intended to replace advice given to you by your health care provider. Make sure you discuss any questions you have with your health care provider.   Document Released: 07/30/2013 Document Revised: 10/30/2014 Document Reviewed: 07/30/2013 Elsevier Interactive Patient Education [employer].

## 2016-03-21 NOTE — Procedures (Signed)
T7 and T8 KP No comp/EBL

## 2016-03-21 NOTE — H&P (Signed)
Chief Complaint: back pain, T7,7 compression fractures  Referring Physician:Dr. Art Hoss  Supervising Physician: Marybelle Killings  Patient Status:Out-pt  HPI: [name] is an 82 y.o. female who is well known to Dr. Barbie Banner and underwent a kyphoplasty in December.  During one of her follow ups in early May, she complained of some pain higher up in her thoracic back.  He sent her for an MRI which revealed compression fractures of T7,8.  She is currently scheduled for KPs of both levels today.  Past Medical History:  Past Medical History  Diagnosis Date  . Hypertension   . 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 Meniere's disease   . Irregular heart beat   . Memory difficulty   . Atrial fibrillation [facility])     Past Surgical History:  Past Surgical History  Procedure Laterality Date  . Mastoidectomy    . Squamous cell excised  2010    Ear  . Menieres surg    . Tonsillectomy      Family History:  Family History  Problem Relation Age of Onset  . Hypertension Mother   . Hypertension Father   . Heart disease Father     Social History:  reports that she has quit smoking. She does not have any smokeless tobacco history on file. She reports that she drinks about 3.5 oz of alcohol per week. Her drug history is not on file.  Allergies: No Known Allergies  Medications:   Medication List    ASK your doctor about these medications        acetaminophen 500 MG tablet  Commonly known as:  TYLENOL  Take 500 mg by mouth every 6 (six) hours as needed. Takes 2 tabs po Q am.     amLODipine 5 MG tablet  Commonly known as:  NORVASC  Take 5 mg by mouth daily.     fentaNYL 12 MCG/HR  Commonly known as:  DURAGESIC - dosed mcg/hr  Place 1 patch (12.5 mcg total) onto the skin every 3 (three) days.     HYDROcodone-acetaminophen 5-325 MG tablet  Commonly known as:  NORCO/VICODIN  Take 1-2 tablets by mouth every 4 (four) hours as needed for moderate pain.     mirtazapine 15 MG  tablet  Commonly known as:  REMERON  Take 15 mg by mouth at bedtime.     ondansetron 4 MG tablet  Commonly known as:  ZOFRAN  Take 1 tablet (4 mg total) by mouth 4 (four) times daily as needed for nausea or vomiting.     prochlorperazine 10 MG tablet  Commonly known as:  COMPAZINE  Take 1 tablet (10 mg total) by mouth every 6 (six) hours as needed for nausea or vomiting.     VITAMIN B 12 PO  Take by mouth.     warfarin 5 MG tablet  Commonly known as:  COUMADIN  Take 5 mg by mouth daily. Reported on 02/29/2016        Please HPI for pertinent positives, otherwise complete 10 system ROS negative.  Mallampati Score: MD Evaluation Airway: WNL Heart: WNL Abdomen: WNL Chest/ Lungs: WNL ASA  Classification: 3 Mallampati/Airway Score: Two  Physical Exam: BP 178/109 mmHg  Pulse 104  Temp(Src) 97.7 F (36.5 C) (Oral)  Resp 16  Ht 5\' 2"  (1.575 m)  Wt 97 lb (43.999 kg)  BMI 17.74 kg/m2  SpO2 98% Body mass index is 17.74 kg/(m^2). General: pleasant, WD, WN, elderly white  female who is laying in bed in NAD HEENT: head is normocephalic, atraumatic.  Sclera are noninjected.  PERRL.  Ears and nose without any masses or lesions.  Mouth is pink and moist Heart: irregularly irregular.  Normal s1,s2. No obvious murmurs, gallops, or rubs noted.  Palpable radial and pedal pulses bilaterally Lungs: CTAB, no wheezes, rhonchi, or rales noted.  Respiratory effort nonlabored Abd: soft, NT, ND, +BS, no masses, hernias, or organomegaly MS: all 4 extremities are symmetrical with no cyanosis, clubbing, or edema. Psych: A&Ox3 with an appropriate affect.   Labs: Results for orders placed or performed during the hospital encounter of 03/21/16 (from the past 48 hour(s))  APTT     Status: None   Collection Time: 03/21/16  9:30 AM  Result Value Ref Range   aPTT 32 24 - 37 seconds  CBC     Status: Abnormal   Collection Time: 03/21/16  9:30 AM  Result Value Ref Range   WBC 4.8 4.0 - 10.5 K/uL    RBC 5.13 (H) 3.87 - 5.11 MIL/uL   Hemoglobin 17.1 (H) 12.0 - 15.0 g/dL   HCT 47.5 (H) 36.0 - 46.0 %   MCV 92.6 78.0 - 100.0 fL   MCH 33.3 26.0 - 34.0 pg   MCHC 36.0 30.0 - 36.0 g/dL   RDW 13.3 11.5 - 15.5 %   Platelets 241 150 - 400 K/uL  Protime-INR     Status: None   Collection Time: 03/21/16  9:30 AM  Result Value Ref Range   Prothrombin Time 14.9 11.6 - 15.2 seconds   INR 1.15 0.00 - 1.49    Imaging: No results found.  Assessment/Plan 1. T7,8 compression fractures -we will plan to proceed with KPs today.  If for some reason we are unable to do KPs, then we can proceed with VPs. -she does take coumadin, but has been off of this since Friday.  Her INR is 1.15 today. -Risks and Benefits discussed with the patient including, but not limited to education regarding the natural healing process of compression fractures without intervention, bleeding, infection, cement migration which may cause spinal cord damage, paralysis, pulmonary embolism or even death. All of the patient's questions were answered, patient is agreeable to proceed. Consent signed and in chart.   Thank you for this interesting consult.  I greatly enjoyed meeting [name] and look forward to participating in their care.  A copy of this report was sent to the requesting provider on this date.  Electronically Signed: Henreitta Cea 03/21/2016, 10:53 AM   I spent a total of    25 Minutes in face to face in clinical consultation, greater than 50% of which was counseling/coordinating care for T7,8 compression fractures, needs KP

## 2016-03-22 ENCOUNTER — Other Ambulatory Visit (HOSPITAL_COMMUNITY): Payer: Self-pay | Admitting: Interventional Radiology

## 2016-03-22 DIAGNOSIS — S32058A Other fracture of fifth lumbar vertebra, initial encounter for closed fracture: Secondary | ICD-10-CM

## 2016-03-22 DIAGNOSIS — S32048A Other fracture of fourth lumbar vertebra, initial encounter for closed fracture: Secondary | ICD-10-CM

## 2016-05-03 ENCOUNTER — Ambulatory Visit
Admission: RE | Admit: 2016-05-03 | Discharge: 2016-05-03 | Disposition: A | Discharge status: Home / Self Care | Payer: Medicare Other | Source: Ambulatory Visit | Attending: Interventional Radiology | Admitting: Interventional Radiology

## 2016-05-03 DIAGNOSIS — S32058A Other fracture of fifth lumbar vertebra, initial encounter for closed fracture: Secondary | ICD-10-CM

## 2016-05-03 DIAGNOSIS — S32048A Other fracture of fourth lumbar vertebra, initial encounter for closed fracture: Secondary | ICD-10-CM

## 2016-05-03 HISTORY — PX: IR GENERIC HISTORICAL: IMG1180011

## 2016-05-03 NOTE — Progress Notes (Signed)
Chief Complaint: Thoracic and Lumbar Fractures   History of Present Illness: [name] is a 82 y.o. female presenting for a scheduled follow up today, SP KP of T7 and T8 performed by my partner, Dr. Barbie Banner, on 03/21/2016.    In addition to these 2 levels, she has previously had L4 and L5 KP performed 10/08/2015.   She tells me that she has done fine after her procedure, and has had satisfying relief of her back pain.  She has been able to return to all of her activities of daily living successfully without any problem.  Her and her husband have been staying in [place], [place] for the summer, and they are very satisfied with the result of the procedure.   She denies any fevers, rigors, or chills.  She denies any restrictions in her activities.  She tells me she occasionally has a "catch" while breathing in, but this cannot be recreated on today's exam with deep inspiration.    Past Medical History  Diagnosis Date  . Hypertension   . 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 Meniere's disease   . Irregular heart beat   . Memory difficulty   . Atrial fibrillation [facility])     Past Surgical History  Procedure Laterality Date  . Mastoidectomy    . Squamous cell excised  2010    Ear  . Menieres surg    . Tonsillectomy      Allergies: Review of patient's allergies indicates no known allergies.  Medications: Prior to Admission medications   Medication Sig Start Date End Date Taking? Authorizing Provider  acetaminophen (TYLENOL) 500 MG tablet Take 500 mg by mouth every 6 (six) hours as needed. Takes 2 tabs po Q am.    Historical Provider, MD  amLODipine (NORVASC) 5 MG tablet Take 5 mg by mouth daily.      Historical Provider, MD  Cyanocobalamin (VITAMIN B 12 PO) Take by mouth.    Historical Provider, MD  fentaNYL (DURAGESIC - DOSED MCG/HR) 12 MCG/HR Place 1 patch (12.5 mcg total) onto the skin every 3 (three) days. Patient not taking: Reported on 12/02/2015 09/28/15   Forest Gleason, MD  HYDROcodone-acetaminophen (NORCO/VICODIN) 5-325 MG tablet Take 1-2 tablets by mouth every 4 (four) hours as needed for moderate pain. Patient not taking: Reported on 12/02/2015 08/20/15   Forest Gleason, MD  mirtazapine (REMERON) 15 MG tablet Take 15 mg by mouth at bedtime.    Historical Provider, MD  ondansetron (ZOFRAN) 4 MG tablet Take 1 tablet (4 mg total) by mouth 4 (four) times daily as needed for nausea or vomiting. Patient not taking: Reported on 12/02/2015 09/06/15   Forest Gleason, MD  prochlorperazine (COMPAZINE) 10 MG tablet Take 1 tablet (10 mg total) by mouth every 6 (six) hours as needed for nausea or vomiting. Patient not taking: Reported on 12/02/2015 09/13/15   Cammie Sickle, MD  warfarin (COUMADIN) 5 MG tablet Take 5 mg by mouth daily. Reported on 02/29/2016    Historical Provider, MD     Family History  Problem Relation Age of Onset  . Hypertension Mother   . Hypertension Father   . Heart disease Father     Social History   Social History  . Marital Status: Married    Spouse Name: N/A  . Number of Children: N/A  . Years of Education: N/A   Social History Main Topics  . Smoking status: Former Research scientist (life sciences)  . Smokeless  tobacco: Not on file  . Alcohol Use: 3.5 oz/week    7 drink(s) per week  . Drug Use: Not on file  . Sexual Activity: Yes    Birth Control/ Protection: Post-menopausal   Other Topics Concern  . Not on file   Social History Narrative     Review of Systems: A 12 point ROS discussed and pertinent positives are indicated in the HPI above.  All other systems are negative.  Review of Systems  Vital Signs: BP 166/98 mmHg  Pulse 92  Temp(Src) 97.7 F (36.5 C) (Oral)  Resp 14  SpO2 97%  Physical Exam Palpation of the thoracic and lumbar spine shows no reproducible pain.    Imaging: No results found.  Labs:  CBC:  Recent Labs  08/23/15 1450 10/08/15 1200 03/21/16 0930  WBC 4.9 6.6 4.8  HGB 17.8* 17.0* 17.1*  HCT 50.7*  49.2* 47.5*  PLT 330 292 241    COAGS:  Recent Labs  10/08/15 1200 03/21/16 0930  INR 0.99 1.15  APTT 30 32    BMP:  Recent Labs  08/23/15 1450 10/08/15 1200  NA 133* 138  K 3.2* 3.2*  CL 94* 95*  CO2 29 28  GLUCOSE 95 114*  BUN 13 9  CALCIUM 8.9 9.5  CREATININE 0.59 0.57  GFRNONAA >60 >60  GFRAA >60 >60    LIVER FUNCTION TESTS:  Recent Labs  08/23/15 1450  BILITOT 0.9  AST 30  ALT 15  ALKPHOS 131*  PROT 8.0  ALBUMIN 4.7    TUMOR MARKERS: No results for input(s): AFPTM, CEA, CA199, CHROMGRNA in the last 8760 hours.  Assessment and Plan:  82 year old female, SP T7 & T8 kyphoplasty performed 5/30, with excellent result.  She has been very satisfied with the outcome.    I have advised her and her husband that she is at risk for future fractures because of her history, and advised that she should inventory her home/homes for any obstacles that would be fall risks.   I answered all of their questions.  We will not schedule any appointments at this time, given that she has progressed very well.   Electronically Signed: Corrie Mckusick 05/03/2016, 2:19 PM   I spent a total of    15 Minutes in face to face in clinical consultation, greater than 50% of which was counseling/coordinating care for KP of T7, T8, L4, L5.

## 2016-07-21 ENCOUNTER — Encounter: Payer: Self-pay | Admitting: Interventional Radiology

## 2016-07-25 ENCOUNTER — Encounter: Payer: Self-pay | Admitting: Interventional Radiology

## 2017-06-06 ENCOUNTER — Encounter: Payer: Medicare Other | Attending: Physician Assistant | Admitting: Physician Assistant

## 2017-06-06 DIAGNOSIS — I480 Paroxysmal atrial fibrillation: Secondary | ICD-10-CM | POA: Diagnosis not present

## 2017-06-06 DIAGNOSIS — Z87891 Personal history of nicotine dependence: Secondary | ICD-10-CM | POA: Insufficient documentation

## 2017-06-06 DIAGNOSIS — F0390 Unspecified dementia without behavioral disturbance: Secondary | ICD-10-CM | POA: Diagnosis not present

## 2017-06-06 DIAGNOSIS — L97822 Non-pressure chronic ulcer of other part of left lower leg with fat layer exposed: Secondary | ICD-10-CM | POA: Diagnosis not present

## 2017-06-06 DIAGNOSIS — Z7901 Long term (current) use of anticoagulants: Secondary | ICD-10-CM | POA: Diagnosis not present

## 2017-06-06 DIAGNOSIS — I10 Essential (primary) hypertension: Secondary | ICD-10-CM | POA: Diagnosis not present

## 2017-06-06 DIAGNOSIS — L03116 Cellulitis of left lower limb: Secondary | ICD-10-CM | POA: Insufficient documentation

## 2017-06-07 ENCOUNTER — Other Ambulatory Visit
Admission: RE | Admit: 2017-06-07 | Discharge: 2017-06-07 | Disposition: A | Discharge status: Home / Self Care | Payer: Medicare Other | Source: Ambulatory Visit | Attending: Physician Assistant | Admitting: Physician Assistant

## 2017-06-07 DIAGNOSIS — X58XXXA Exposure to other specified factors, initial encounter: Secondary | ICD-10-CM | POA: Diagnosis not present

## 2017-06-07 DIAGNOSIS — S81802A Unspecified open wound, left lower leg, initial encounter: Secondary | ICD-10-CM | POA: Insufficient documentation

## 2017-06-07 NOTE — Progress Notes (Signed)
PAIZLIE, KLAUS ([id number]) Visit Report for 06/06/2017 Abuse/Suicide Risk Screen Details [name], [name]. Date of Service: 06/06/2017 8:15 AM Medical Record Number: [id number] Patient Account Number: [id number] Date of Birth/Sex: August 31, 1926 (83 y.o. Female) Treating RN: Cornell Barman Primary Care Lesly Joslyn: BABAOFF, MARCUS Other Clinician: Referring Dymir Neeson: BABAOFF, MARCUS Treating Jalina Blowers/Extender: STONE III, HOYT Weeks in Treatment: 0 Abuse/Suicide Risk Screen Items Answer ABUSE/SUICIDE RISK SCREEN: Has anyone close to you tried to hurt or harm you recentlyo No Do you feel uncomfortable with anyone in your familyo No Has anyone forced you do things that you didnot want to doo No Do you have any thoughts of harming yourselfo No Patient displays signs or symptoms of abuse and/or neglect. No Electronic Signature(s) Signed: 06/06/2017 9:29:21 AM By: Gretta Cool, BSN, RN, [name], Kim RN, BSN Entered By: Gretta Cool, BSN, RN, [name], Kim on 06/06/2017 08:43:45 [name] ([id number]) -------------------------------------------------------------------------------- Activities of Daily Living Details [name]. Date of Service: 06/06/2017 8:15 AM Medical Record Number: [id number] Patient Account Number: [id number] Date of Birth/Sex: Feb 16, 1926 (83 y.o. Female) Treating RN: Cornell Barman Primary Care Jesus Poplin: Derinda Late Other Clinician: Referring Ashely Goosby: BABAOFF, MARCUS Treating Emmaleigh Longo/Extender: STONE III, HOYT Weeks in Treatment: 0 Activities of Daily Living Items Answer Activities of Daily Living (Please select one for each item) Drive Automobile Completely Able Take Medications Completely Able Use Telephone Completely Able Care for Appearance Completely Able Use Toilet Completely Able Bath / Shower Completely Able Dress Self Completely Able Feed Self Completely Able Walk Completely Able Get In / Out Bed Completely Able Housework Completely  Able Prepare Meals Completely Able Handle Money Completely Able Shop for Self Completely Able Electronic Signature(s) Signed: 06/06/2017 9:29:21 AM By: Gretta Cool, BSN, RN, [name], Kim RN, BSN Entered By: Gretta Cool, BSN, RN, [name], Kim on 06/06/2017 08:44:03 [name] ([id number]) -------------------------------------------------------------------------------- Education Assessment Details [name]. Date of Service: 06/06/2017 8:15 AM Medical Record Number: [id number] Patient Account Number: [id number] Date of Birth/Sex: 1926-06-07 (83 y.o. Female) Treating RN: Cornell Barman Primary Care Suprena Travaglini: Derinda Late Other Clinician: Referring Somer Trotter: Derinda Late Treating Tanyah Debruyne/Extender: Melburn Hake, HOYT Weeks in Treatment: 0 Primary Learner Assessed: Patient Learning Preferences/Education Level/Primary Language Learning Preference: Explanation Highest Education Level: College or Above Preferred Language: English Cognitive Barrier Assessment/Beliefs Language Barrier: No Translator Needed: No Memory Deficit: No Emotional Barrier: No Cultural/Religious Beliefs Affecting Medical No Care: Physical Barrier Assessment Impaired Vision: No Impaired Hearing: No Knowledge/Comprehension Assessment Knowledge Level: Medium Comprehension Level: Medium Ability to understand written Medium instructions: Ability to understand verbal Medium instructions: Motivation Assessment Anxiety Level: Calm Cooperation: Cooperative Education Importance: Acknowledges Need Interest in Health Problems: Asks Questions Perception: Coherent Willingness to Engage in Self- High Management Activities: Readiness to Engage in Self- High Management Activities: Electronic Signature(s) Signed: 06/06/2017 9:29:21 AM By: Gretta Cool, BSN, RN, [name], Kim RN, BSN [address] [place] ([id number]) Entered By: Gretta Cool, BSN, RN, [name], Kim on 06/06/2017 08:44:29 KEMORA, PINARD  ([id number]) -------------------------------------------------------------------------------- Fall Risk Assessment Details [name]. Date of Service: 06/06/2017 8:15 AM Medical Record Number: [id number] Patient Account Number: [id number] Date of Birth/Sex: 1926/06/28 (83 y.o. Female) Treating RN: Cornell Barman Primary Care Jenavieve Freda: BABAOFF, MARCUS Other Clinician: Referring Jacalynn Buzzell: BABAOFF, MARCUS Treating Grisel Blumenstock/Extender: STONE III, HOYT Weeks in Treatment: 0 Fall Risk Assessment Items Have you had 2 or more falls in the last 12 monthso 0 No Have you had any fall that resulted in injury in the last 12 monthso 0 No FALL RISK ASSESSMENT: History of  falling - immediate or within 3 months 0 No Secondary diagnosis 0 No Ambulatory aid None/bed rest/wheelchair/nurse 0 Yes Crutches/cane/walker 0 No Furniture 0 No IV Access/Saline Lock 0 No Gait/Training Normal/bed rest/immobile 0 Yes Weak 0 No Impaired 0 No Mental Status Oriented to own ability 0 Yes Electronic Signature(s) Signed: 06/06/2017 9:29:21 AM By: Gretta Cool, BSN, RN, [name], Kim RN, BSN Entered By: Gretta Cool, BSN, RN, [name], Kim on 06/06/2017 08:44:42 [name] ([id number]) -------------------------------------------------------------------------------- Nutrition Risk Assessment Details [name]. Date of Service: 06/06/2017 8:15 AM Medical Record Number: [id number] Patient Account Number: [id number] Date of Birth/Sex: [birth year] (83 y.o. Female) Treating RN: Cornell Barman Primary Care Kensy Blizard: BABAOFF, MARCUS Other Clinician: Referring Keyvon Herter: BABAOFF, MARCUS Treating Shavonda Wiedman/Extender: STONE III, HOYT Weeks in Treatment: 0 Height (in): 63 Weight (lbs): 99 Body Mass Index (BMI): 17.5 Nutrition Risk Assessment Items NUTRITION RISK SCREEN: I have an illness or condition that made me change the kind and/or 0 No amount of food I eat I eat fewer than two meals per day 0 No I eat  few fruits and vegetables, or milk products 0 No I have three or more drinks of beer, liquor or wine almost every day 0 No I have tooth or mouth problems that make it hard for me to eat 0 No I don't always have enough money to buy the food I need 0 No I eat alone most of the time 0 No I take three or more different prescribed or over-the-counter drugs a 1 Yes day Without wanting to, I have lost or gained 10 pounds in the last six 0 No months I am not always physically able to shop, cook and/or feed myself 0 No Nutrition Protocols Good Risk Protocol 0 No interventions needed Moderate Risk Protocol Electronic Signature(s) Signed: 06/06/2017 9:29:21 AM By: Gretta Cool, BSN, RN, [name], Kim RN, BSN Entered By: Gretta Cool, BSN, RN, [name], Kim on 06/06/2017 08:44:56

## 2017-06-08 NOTE — Progress Notes (Signed)
[name], [name] ([id number]) Visit Report for 06/06/2017 Chief Complaint Document Details [name], BAND. Date of Service: 06/06/2017 8:15 AM Medical Record Number: [id number] Patient Account Number: [id number] Date of Birth/Sex: 1926/05/29 (83 y.o. Female) Treating RN: Cornell Barman Primary Care Provider: Derinda Late Other Clinician: Referring Provider: Derinda Late Treating Provider/Extender: Melburn Hake, HOYT Weeks in Treatment: 0 Information Obtained from: Patient Chief Complaint Left anterior shin ulcer Electronic Signature(s) Signed: 06/07/2017 12:37:32 AM By: Worthy Keeler PA-C Entered By: Worthy Keeler on 06/07/2017 00:10:12 [name] ([id number]) -------------------------------------------------------------------------------- HPI Details [name]. Date of Service: 06/06/2017 8:15 AM Medical Record Number: [id number] Patient Account Number: [id number] Date of Birth/Sex: 11/22/1925 (83 y.o. Female) Treating RN: Cornell Barman Primary Care Provider: Derinda Late Other Clinician: Referring Provider: BABAOFF, MARCUS Treating Provider/Extender: Melburn Hake, HOYT Weeks in Treatment: 0 History of Present Illness HPI Description: 06/06/17 on evaluation today patient presents for evaluation concerning an ulcer which she initially had arise as a result of striking her left lower extremity money bed frame. With that being said this was roughly one month ago and unfortunately the wound despite two courses of Keflex have not really made a dramatic improvement. This has continued to drain as well as being exquisitely tender at times. Even to the point that she is having a difficult time walking. Patient does have chronic atrial fibrillation which subsequently leads to her being on long-term anticoagulant therapy and this causes ED bruising which may have contributed to this entry and where things are at this point. She does have valvular  heart disease as well as hypertension and her last INR was 2.7. Currently Neosporin, Gauls, and an ace wrap has been utilized. Keflex is the antibiotic that she has been on. Patient has not had a culture up to this point and she states that her blood pressure at the primary care office yesterday was 138/72. Her pain is significant related to be an eight out of 10. No fevers, chills, nausea, or vomiting noted at this time. Electronic Signature(s) Signed: 06/07/2017 12:37:32 AM By: Worthy Keeler PA-C Entered By: Worthy Keeler on 06/07/2017 00:17:25 [name] ([id number]) -------------------------------------------------------------------------------- Physical Exam Details [name], [place]. Date of Service: 06/06/2017 8:15 AM Medical Record Number: [id number] Patient Account Number: [id number] Date of Birth/Sex: [birth year] (83 y.o. Female) Treating RN: Cornell Barman Primary Care Provider: Derinda Late Other Clinician: Referring Provider: BABAOFF, MARCUS Treating Provider/Extender: STONE III, HOYT Weeks in Treatment: 0 Constitutional patient is hypertensive.. pulse regular and within target range for patient.Marland Kitchen respirations regular, non-labored and within target range for patient.Marland Kitchen temperature within target range for patient.. Thin and well-hydrated in no acute distress. Eyes conjunctiva clear no eyelid edema noted. pupils equal round and reactive to light and accommodation. Ears, Nose, Mouth, and Throat no gross abnormality of ear auricles or external auditory canals. normal hearing noted during conversation. mucus membranes moist. Respiratory normal breathing without difficulty. clear to auscultation bilaterally. Cardiovascular regular rate and rhythm with normal S1, S2 no A-fib audible today. 1+ dorsalis pedis/posterior tibialis pulses. no clubbing, cyanosis, significant edema, <3 sec cap refill. Gastrointestinal (GI) soft, non-tender, non-distended, +BS. no  ventral hernia noted. Musculoskeletal normal gait and posture. Psychiatric this patient is able to make decisions and demonstrates good insight into disease process. Alert and Oriented x 3. pleasant and cooperative. Notes Patient's wound does show erythema surrounding at this point in time. There is some purulent discharge noted and this was sample today to  be sent culture. Patient's wound was also fairly tender to palpation today. Electronic Signature(s) Signed: 06/07/2017 12:37:32 AM By: Worthy Keeler PA-C Entered By: Worthy Keeler on 06/07/2017 00:28:35 [name] ([id number]) -------------------------------------------------------------------------------- Physician Orders Details [name] Date of Service: 06/06/2017 8:15 AM Medical Record Number: [id number] Patient Account Number: [id number] Date of Birth/Sex: 1926/05/05 (83 y.o. Female) Treating RN: Cornell Barman Primary Care Provider: BABAOFF, MARCUS Other Clinician: Referring Provider: BABAOFF, MARCUS Treating Provider/Extender: Melburn Hake, HOYT Weeks in Treatment: 0 Verbal / Phone Orders: No Diagnosis Coding ICD-10 Coding Code Description L03.116 Cellulitis of left lower limb L97.822 Non-pressure chronic ulcer of other part of left lower leg with fat layer exposed I48.0 Paroxysmal atrial fibrillation Z79.01 Long term (current) use of anticoagulants Wound Cleansing o Cleanse wound with mild soap and water o May Shower, gently pat wound dry prior to applying new dressing. Anesthetic Wound #1 Left,Medial Lower Leg o Topical Lidocaine 4% cream applied to wound bed prior to debridement Primary Wound Dressing Wound #1 Left,Medial Lower Leg o Mepitel One Contact layer o Aquacel Ag Secondary Dressing Wound #1 Left,Medial Lower Leg o ABD and Kerlix/Conform Dressing Change Frequency Wound #1 Left,Medial Lower Leg o Change dressing every other day. Follow-up Appointments Wound  #1 Left,Medial Lower Leg o Return Appointment in 2 weeks. Edema Control Wound #1 Left,Medial Lower Leg o Elevate legs to the level of the heart and pump ankles as often as possible [name], [name] ([id number]) Patient Medications Allergies: No Known Drug Allergies Notifications Medication Indication Start End doxycycline monohydrate 06/06/2017 DOSE oral 100 mg capsule - take 1 capsule 2 times a day for 10 days. BE SURE TO TAKE 1 HOUR BEFORE A MEAL Notes This time I discussed treatment options with patient as well as her husband during the evaluation. Subsequently we determined that we would initiate the above wound care orders including the silver alginate dressing and to ensure that nothing gets stuck we will place adapt underneath the alginate. This should again prevent anything from sticking to the wound bed and causing additional discomfort and pain. Patient will be on vacation next week and therefore we will not be able to have a one week follow-up. For that reason I'm gonna recommend a two week follow-up for her to reevaluate and see were things stand at that point. I did initiate doxycycline for her as well to be taking one pill two times a day for 10 days and advised they discontinue the Keflex. We will see were things stand in two weeks time when she is seen for reevaluation. Electronic Signature(s) Signed: 06/07/2017 12:37:32 AM By: Worthy Keeler PA-C Previous Signature: 06/06/2017 9:09:19 AM Version By: Worthy Keeler PA-C Entered By: Worthy Keeler on 06/07/2017 00:30:38 [name] ([id number]) -------------------------------------------------------------------------------- Problem List Details Patient Name: LORRANE, MCCAY. Date of Service: 06/06/2017 8:15 AM Medical Record Number: [id number] Patient Account Number: [id number] Date of Birth/Sex: 12-23-1925 (83 y.o. Female) Treating RN: Cornell Barman Primary Care Provider: Derinda Late Other  Clinician: Referring Provider: BABAOFF, MARCUS Treating Provider/Extender: Melburn Hake, HOYT Weeks in Treatment: 0 Active Problems ICD-10 Encounter Code Description Active Date Diagnosis L03.116 Cellulitis of left lower limb 06/07/2017 Yes L97.822 Non-pressure chronic ulcer of other part of left lower leg 06/07/2017 Yes with fat layer exposed I48.0 Paroxysmal atrial fibrillation 06/07/2017 Yes Z79.01 Long term (current) use of anticoagulants 06/07/2017 Yes Inactive Problems Resolved Problems Electronic Signature(s) Signed: 06/07/2017 12:37:32 AM By: Worthy Keeler PA-C Entered By: Melburn Hake,  Hoyt on 06/07/2017 00:06:35 [name], [name] ([id number]) -------------------------------------------------------------------------------- Progress Note Details [name], GILLAND. Date of Service: 06/06/2017 8:15 AM Medical Record Number: [id number] Patient Account Number: [id number] Date of Birth/Sex: [birth year] (83 y.o. Female) Treating RN: Cornell Barman Primary Care Provider: Derinda Late Other Clinician: Referring Provider: BABAOFF, MARCUS Treating Provider/Extender: Melburn Hake, HOYT Weeks in Treatment: 0 Subjective Chief Complaint Information obtained from Patient Left anterior shin ulcer History of Present Illness (HPI) 06/06/17 on evaluation today patient presents for evaluation concerning an ulcer which she initially had arise as a result of striking her left lower extremity money bed frame. With that being said this was roughly one month ago and unfortunately the wound despite two courses of Keflex have not really made a dramatic improvement. This has continued to drain as well as being exquisitely tender at times. Even to the point that she is having a difficult time walking. Patient does have chronic atrial fibrillation which subsequently leads to her being on long-term anticoagulant therapy and this causes ED bruising which may have contributed to this entry and where  things are at this point. She does have valvular heart disease as well as hypertension and her last INR was 2.7. Currently Neosporin, Gauls, and an ace wrap has been utilized. Keflex is the antibiotic that she has been on. Patient has not had a culture up to this point and she states that her blood pressure at the primary care office yesterday was 138/72. Her pain is significant related to be an eight out of 10. No fevers, chills, nausea, or vomiting noted at this time. Wound History Patient presents with 1 open wound that has been present for approximately 1 month. Patient has been treating wound in the following manner: neosporin. Laboratory tests have not been performed in the last month. Patient reportedly has not tested positive for an antibiotic resistant organism. Patient reportedly has not tested positive for osteomyelitis. Patient reportedly has not had testing performed to evaluate circulation in the legs. Patient History Information obtained from Patient. Allergies No Known Drug Allergies Family History Cancer - Siblings, Hypertension - Father, Siblings, Stroke - Father, No family history of Diabetes, Heart Disease, Kidney Disease, Lung Disease, Seizures, Thyroid Problems, Tuberculosis. Social History TRUDI, MORGENTHALER ([id number]) Former smoker, Marital Status - Married, Alcohol Use - Daily, Drug Use - No History, Caffeine Use - Never. Medical History Eyes Patient has history of Cataracts - removed Denies history of Glaucoma, Optic Neuritis Ear/Nose/Mouth/Throat Denies history of Chronic sinus problems/congestion Hematologic/Lymphatic Denies history of Anemia, Hemophilia, Human Immunodeficiency Virus, Lymphedema, Sickle Cell Disease Respiratory Denies history of Aspiration, Asthma, Chronic Obstructive Pulmonary Disease (COPD), Pneumothorax, Sleep Apnea, Tuberculosis Cardiovascular Patient has history of Arrhythmia, Hypertension Denies history of Angina, Congestive Heart  Failure, Coronary Artery Disease, Deep Vein Thrombosis, Hypotension, Myocardial Infarction, Peripheral Arterial Disease, Peripheral Venous Disease, Phlebitis, Vasculitis Gastrointestinal Denies history of Cirrhosis , Colitis, Crohn s, Hepatitis A, Hepatitis B, Hepatitis C Endocrine Denies history of Type I Diabetes, Type II Diabetes Genitourinary Denies history of End Stage Renal Disease Immunological Denies history of Lupus Erythematosus, Raynaud s, Scleroderma Integumentary (Skin) Denies history of History of Burn, History of pressure wounds Musculoskeletal Patient has history of Osteoarthritis - hands Denies history of Gout, Rheumatoid Arthritis, Osteomyelitis Neurologic Patient has history of Dementia Denies history of Neuropathy, Quadriplegia, Paraplegia, Seizure Disorder Oncologic Denies history of Received Chemotherapy, Received Radiation Medical And Surgical History Notes Constitutional Symptoms (General Health) A-Fib; HTN; Valvular heart disease Review of Systems (ROS) Constitutional Symptoms (  General Health) The patient has no complaints or symptoms. Eyes The patient has no complaints or symptoms. Ear/Nose/Mouth/Throat The patient has no complaints or symptoms. Hematologic/Lymphatic The patient has no complaints or symptoms. Respiratory [name], [name] ([id number]) The patient has no complaints or symptoms. Cardiovascular The patient has no complaints or symptoms. Gastrointestinal The patient has no complaints or symptoms. Endocrine The patient has no complaints or symptoms. Genitourinary The patient has no complaints or symptoms. Immunological The patient has no complaints or symptoms. Integumentary (Skin) Complains or has symptoms of Wounds, Bleeding or bruising tendency. Denies complaints or symptoms of Breakdown, Swelling. Musculoskeletal The patient has no complaints or symptoms. Neurologic The patient has no complaints or symptoms. Oncologic The  patient has no complaints or symptoms. Objective Constitutional patient is hypertensive.. pulse regular and within target range for patient.Marland Kitchen respirations regular, non-labored and within target range for patient.Marland Kitchen temperature within target range for patient.. Thin and well-hydrated in no acute distress. Vitals Time Taken: 8:28 AM, Height: 63 in, Source: Measured, Weight: 99 lbs, Source: Measured, BMI: 17.5, Temperature: 97.9 F, Pulse: 97 bpm, Respiratory Rate: 16 breaths/min, Blood Pressure: 180/116 mmHg. General Notes: Patient states she has not taken her medications this morning. Patient states BP yesterday was 138/87 in PCP's office Eyes conjunctiva clear no eyelid edema noted. pupils equal round and reactive to light and accommodation. Ears, Nose, Mouth, and Throat no gross abnormality of ear auricles or external auditory canals. normal hearing noted during conversation. mucus membranes moist. [name], [name] ([id number]) Respiratory normal breathing without difficulty. clear to auscultation bilaterally. Cardiovascular regular rate and rhythm with normal S1, S2 no A-fib audible today. 1+ dorsalis pedis/posterior tibialis pulses. no clubbing, cyanosis, significant edema, Gastrointestinal (GI) soft, non-tender, non-distended, +BS. no ventral hernia noted. Musculoskeletal normal gait and posture. Psychiatric this patient is able to make decisions and demonstrates good insight into disease process. Alert and Oriented x 3. pleasant and cooperative. General Notes: Patient's wound does show erythema surrounding at this point in time. There is some purulent discharge noted and this was sample today to be sent culture. Patient's wound was also fairly tender to palpation today. Integumentary (Hair, Skin) Wound #1 status is Open. Original cause of wound was Trauma. The wound is located on the Left,Medial Lower Leg. The wound measures 1.5cm length x 1.2cm width x 0.1cm depth; 1.414cm^2  area and 0.141cm^3 volume. There is Fat Layer (Subcutaneous Tissue) Exposed exposed. There is no tunneling or undermining noted. There is a large amount of serosanguineous drainage noted. The wound margin is flat and intact. There is no granulation within the wound bed. There is a large (67-100%) amount of necrotic tissue within the wound bed including Eschar and Adherent Slough. The periwound skin appearance exhibited: Excoriation, Ecchymosis. Assessment Active Problems ICD-10 L03.116 - Cellulitis of left lower limb L97.822 - Non-pressure chronic ulcer of other part of left lower leg with fat layer exposed I48.0 - Paroxysmal atrial fibrillation Z79.01 - Long term (current) use of anticoagulants Plan Artist, Calista C. ([id number]) Wound Cleansing: Cleanse wound with mild soap and water May Shower, gently pat wound dry prior to applying new dressing. Anesthetic: Wound #1 Left,Medial Lower Leg: Topical Lidocaine 4% cream applied to wound bed prior to debridement Primary Wound Dressing: Wound #1 Left,Medial Lower Leg: Mepitel One Contact layer Aquacel Ag Secondary Dressing: Wound #1 Left,Medial Lower Leg: ABD and Kerlix/Conform Dressing Change Frequency: Wound #1 Left,Medial Lower Leg: Change dressing every other day. Follow-up Appointments: Wound #1 Left,Medial Lower Leg: Return Appointment in 2  weeks. Edema Control: Wound #1 Left,Medial Lower Leg: Elevate legs to the level of the heart and pump ankles as often as possible The following medication(s) was prescribed: doxycycline monohydrate oral 100 mg capsule take 1 capsule 2 times a day for 10 days. BE SURE TO TAKE 1 HOUR BEFORE A MEAL starting 06/06/2017 General Notes: This time I discussed treatment options with patient as well as her husband during the evaluation. Subsequently we determined that we would initiate the above wound care orders including the silver alginate dressing and to ensure that nothing gets stuck we will  place adapt underneath the alginate. This should again prevent anything from sticking to the wound bed and causing additional discomfort and pain. Patient will be on vacation next week and therefore we will not be able to have a one week follow-up. For that reason I'm gonna recommend a two week follow-up for her to reevaluate and see were things stand at that point. I did initiate doxycycline for her as well to be taking one pill two times a day for 10 days and advised they discontinue the Keflex. We will see were things stand in two weeks time when she is seen for reevaluation. Electronic Signature(s) Signed: 06/07/2017 12:37:32 AM By: Worthy Keeler PA-C Entered By: Worthy Keeler on 06/07/2017 00:30:53 [name] ([id number]) -------------------------------------------------------------------------------- ROS/PFSH Details [name] Date of Service: 06/06/2017 8:15 AM Medical Record Number: [id number] Patient Account Number: [id number] Date of Birth/Sex: 01/27/1926 (83 y.o. Female) Treating RN: Cornell Barman Primary Care Provider: BABAOFF, MARCUS Other Clinician: Referring Provider: BABAOFF, MARCUS Treating Provider/Extender: STONE III, HOYT Weeks in Treatment: 0 Information Obtained From Patient Wound History Do you currently have one or more open woundso Yes How many open wounds do you currently haveo 1 Approximately how long have you had your woundso 1 month How have you been treating your wound(s) until nowo neosporin Has your wound(s) ever healed and then re-openedo No Have you had any lab work done in the past montho No Have you tested positive for an antibiotic resistant organism (MRSA, VRE)o No Have you tested positive for osteomyelitis (bone infection)o No Have you had any tests for circulation on your legso No Constitutional Symptoms (General Health) Complaints and Symptoms: No Complaints or Symptoms Complaints and Symptoms: Negative for:  Fatigue; Fever; Chills; Marked Weight Change Medical History: Past Medical History Notes: A-Fib; HTN; Valvular heart disease Eyes Complaints and Symptoms: No Complaints or Symptoms Complaints and Symptoms: Negative for: Dry Eyes; Vision Changes; Glasses / Contacts Medical History: Positive for: Cataracts - removed Negative for: Glaucoma; Optic Neuritis Ear/Nose/Mouth/Throat Complaints and Symptoms: No Complaints or Symptoms [name], [name] ([id number]) Complaints and Symptoms: Negative for: Difficult clearing ears; Sinusitis Medical History: Negative for: Chronic sinus problems/congestion Gastrointestinal Complaints and Symptoms: No Complaints or Symptoms Complaints and Symptoms: Negative for: Frequent diarrhea; Nausea; Vomiting Medical History: Negative for: Cirrhosis ; Colitis; Crohnos; Hepatitis A; Hepatitis B; Hepatitis C Integumentary (Skin) Complaints and Symptoms: Positive for: Wounds; Bleeding or bruising tendency Negative for: Breakdown; Swelling Medical History: Negative for: History of Burn; History of pressure wounds Hematologic/Lymphatic Complaints and Symptoms: No Complaints or Symptoms Medical History: Negative for: Anemia; Hemophilia; Human Immunodeficiency Virus; Lymphedema; Sickle Cell Disease Respiratory Complaints and Symptoms: No Complaints or Symptoms Medical History: Negative for: Aspiration; Asthma; Chronic Obstructive Pulmonary Disease (COPD); Pneumothorax; Sleep Apnea; Tuberculosis Cardiovascular Complaints and Symptoms: No Complaints or Symptoms Medical History: Positive for: Arrhythmia; Hypertension Negative for: Angina; Congestive Heart Failure; Coronary Artery Disease; Deep  Vein Thrombosis; Hypotension; Myocardial Infarction; Peripheral Arterial Disease; Peripheral Venous Disease; Phlebitis; [name], [name] ([id number]) Vasculitis Endocrine Complaints and Symptoms: No Complaints or Symptoms Medical History: Negative for: Type  I Diabetes; Type II Diabetes Genitourinary Complaints and Symptoms: No Complaints or Symptoms Medical History: Negative for: End Stage Renal Disease Immunological Complaints and Symptoms: No Complaints or Symptoms Medical History: Negative for: Lupus Erythematosus; Raynaudos; Scleroderma Musculoskeletal Complaints and Symptoms: No Complaints or Symptoms Medical History: Positive for: Osteoarthritis - hands Negative for: Gout; Rheumatoid Arthritis; Osteomyelitis Neurologic Complaints and Symptoms: No Complaints or Symptoms Medical History: Positive for: Dementia Negative for: Neuropathy; Quadriplegia; Paraplegia; Seizure Disorder Oncologic Complaints and Symptoms: No Complaints or Symptoms Medical History: Negative for: Received Chemotherapy; Received Radiation [name], [name] ([id number]) HBO Extended History Items Eyes: Cataracts Immunizations Pneumococcal Vaccine: Received Pneumococcal Vaccination: Yes Family and Social History Cancer: Yes - Siblings; Diabetes: No; Heart Disease: No; Hypertension: Yes - Father, Siblings; Kidney Disease: No; Lung Disease: No; Seizures: No; Stroke: Yes - Father; Thyroid Problems: No; Tuberculosis: No; Former smoker; Marital Status - Married; Alcohol Use: Daily; Drug Use: No History; Caffeine Use: Never; Financial Concerns: No; Food, Clothing or Shelter Needs: No; Support System Lacking: No; Transportation Concerns: No; Advanced Directives: Yes (Not Provided); Living Will: Yes (Copy provided); Medical Power of Attorney: Astronomer) Signed: 06/06/2017 9:29:21 AM By: Gretta Cool, BSN, RN, [name], Kim RN, BSN Signed: 06/07/2017 12:37:32 AM By: Worthy Keeler PA-C Entered By: Gretta Cool BSN, RN, [name], Kim on 06/06/2017 08:43:36 [name], [name] ([id number]) -------------------------------------------------------------------------------- SuperBill Details [name], [name]. Date of Service: 06/06/2017 Medical Record  Number: [id number] Patient Account Number: [id number] Date of Birth/Sex: 09/10/1926 (83 y.o. Female) Treating RN: Cornell Barman Primary Care Provider: BABAOFF, MARCUS Other Clinician: Referring Provider: BABAOFF, MARCUS Treating Provider/Extender: Melburn Hake, HOYT Weeks in Treatment: 0 Diagnosis Coding ICD-10 Codes Code Description L03.116 Cellulitis of left lower limb L97.822 Non-pressure chronic ulcer of other part of left lower leg with fat layer exposed I48.0 Paroxysmal atrial fibrillation Z79.01 Long term (current) use of anticoagulants Facility Procedures CPT4 Code: [id number] Description: 99214 - WOUND CARE VISIT-LEV 4 EST PT Modifier: Quantity: 1 Physician Procedures CPT4: Description Modifier Quantity Code [id number] 49179 - WC PHYS LEVEL 4 - NEW PT 1 ICD-10 Description Diagnosis L03.116 Cellulitis of left lower limb L97.822 Non-pressure chronic ulcer of other part of left lower leg with fat layer exposed I48.0  Paroxysmal atrial fibrillation Z79.01 Long term (current) use of anticoagulants Electronic Signature(s) Signed: 06/07/2017 12:37:32 AM By: Worthy Keeler PA-C Entered By: Worthy Keeler on 06/07/2017 00:31:14

## 2017-06-08 NOTE — Progress Notes (Signed)
MARNIE, FAZZINO ([id number]) Visit Report for 06/06/2017 Allergy List Details [name], [name]. Date of Service: 06/06/2017 8:15 AM Medical Record Number: [id number] Patient Account Number: [id number] Date of Birth/Sex: June 01, 1926 (83 y.o. Female) Treating RN: Cornell Barman Primary Care Evadne Ose: Derinda Late Other Clinician: Referring Ndidi Nesby: BABAOFF, MARCUS Treating Azavier Creson/Extender: STONE III, HOYT Weeks in Treatment: 0 Allergies Active Allergies No Known Drug Allergies Allergy Notes Electronic Signature(s) Signed: 06/06/2017 9:29:21 AM By: Gretta Cool, BSN, RN, [name], Kim RN, BSN Entered By: Gretta Cool, BSN, RN, [name], Kim on 06/06/2017 08:36:58 [name] ([id number]) -------------------------------------------------------------------------------- Arrival Information Details [name] Date of Service: 06/06/2017 8:15 AM Medical Record Number: [id number] Patient Account Number: [id number] Date of Birth/Sex: [birth year] (83 y.o. Female) Treating RN: Cornell Barman Primary Care Renay Crammer: Derinda Late Other Clinician: Referring Lavonda Thal: BABAOFF, MARCUS Treating Maevyn Riordan/Extender: Melburn Hake, HOYT Weeks in Treatment: 0 Visit Information Patient Arrived: Ambulatory Arrival Time: 08:23 Accompanied By: husband Transfer Assistance: None Patient Identification Verified: Yes Secondary Verification Process Yes Completed: Patient Has Alerts: Yes Patient Alerts: Patient on Blood Thinner Coumadin No ABI DT pain/location Electronic Signature(s) Signed: 06/06/2017 9:29:21 AM By: Gretta Cool, BSN, RN, [name], Kim RN, BSN Entered By: Gretta Cool, BSN, RN, [name], Kim on 06/06/2017 08:36:48 [name] ([id number]) -------------------------------------------------------------------------------- Clinic Level of Care Assessment Details Patient Name: Brittany, [name]. Date of Service: 06/06/2017 8:15 AM Medical Record Number: [id number] Patient Account Number:  [id number] Date of Birth/Sex: [birth year] (83 y.o. Female) Treating RN: Cornell Barman Primary Care Daylin Eads: Derinda Late Other Clinician: Referring  Wenzlick: BABAOFF, MARCUS Treating Briyonna Omara/Extender: STONE III, HOYT Weeks in Treatment: 0 Clinic Level of Care Assessment Items TOOL 2 Quantity Score []  - Use when only an EandM is performed on the INITIAL visit 0 ASSESSMENTS - Nursing Assessment / Reassessment X - General Physical Exam (combine w/ comprehensive assessment (listed just 1 20 below) when performed on new pt. evals) X - Comprehensive Assessment (HX, ROS, Risk Assessments, Wounds Hx, etc.) 1 25 ASSESSMENTS - Wound and Skin Assessment / Reassessment X - Simple Wound Assessment / Reassessment - one wound 1 5 []  - Complex Wound Assessment / Reassessment - multiple wounds 0 []  - Dermatologic / Skin Assessment (not related to wound area) 0 ASSESSMENTS - Ostomy and/or Continence Assessment and Care []  - Incontinence Assessment and Management 0 []  - Ostomy Care Assessment and Management (repouching, etc.) 0 PROCESS - Coordination of Care X - Simple Patient / Family Education for ongoing care 1 15 []  - Complex (extensive) Patient / Family Education for ongoing care 0 X - Staff obtains Programmer, systems, Records, Test Results / Process Orders 1 10 []  - Staff telephones HHA, Nursing Homes / Clarify orders / etc 0 []  - Routine Transfer to another Facility (non-emergent condition) 0 []  - Routine Hospital Admission (non-emergent condition) 0 []  - New Admissions / Biomedical engineer / Ordering NPWT, Apligraf, etc. 0 []  - Emergency Hospital Admission (emergent condition) 0 X - Simple Discharge Coordination 1 10 Menna, Delcenia C. ([id number]) []  - Complex (extensive) Discharge Coordination 0 PROCESS - Special Needs []  - Pediatric / Minor Patient Management 0 []  - Isolation Patient Management 0 []  - Hearing / Language / Visual special needs 0 []  - Assessment of Community assistance  (transportation, D/C planning, etc.) 0 []  - Additional assistance / Altered mentation 0 []  - Support Surface(s) Assessment (bed, cushion, seat, etc.) 0 INTERVENTIONS - Wound Cleansing / Measurement X - Wound Imaging (photographs - any number of wounds) 1 5 []  - Wound Tracing (instead  of photographs) 0 X - Simple Wound Measurement - one wound 1 5 []  - Complex Wound Measurement - multiple wounds 0 X - Simple Wound Cleansing - one wound 1 5 []  - Complex Wound Cleansing - multiple wounds 0 INTERVENTIONS - Wound Dressings []  - Small Wound Dressing one or multiple wounds 0 X - Medium Wound Dressing one or multiple wounds 1 15 []  - Large Wound Dressing one or multiple wounds 0 []  - Application of Medications - injection 0 INTERVENTIONS - Miscellaneous []  - External ear exam 0 X - Specimen Collection (cultures, biopsies, blood, body fluids, etc.) 1 5 X - Specimen(s) / Culture(s) sent or taken to Lab for analysis 1 5 []  - Patient Transfer (multiple staff / Civil Service fast streamer / Similar devices) 0 []  - Simple Staple / Suture removal (25 or less) 0 []  - Complex Staple / Suture removal (26 or more) 0 Yearwood, Laysha C. ([id number]) []  - Hypo / Hyperglycemic Management (close monitor of Blood Glucose) 0 []  - Ankle / Brachial Index (ABI) - do not check if billed separately 0 Has the patient been seen at the hospital within the last three years: Yes Total Score: 125 Level Of Care: New/Established - Level 4 Electronic Signature(s) Signed: 06/06/2017 9:29:21 AM By: Gretta Cool, BSN, RN, [name], Kim RN, BSN Entered By: Gretta Cool, BSN, RN, [name], Kim on 06/06/2017 09:11:27 [name] ([id number]) -------------------------------------------------------------------------------- Encounter Discharge Information Details [name]. Date of Service: 06/06/2017 8:15 AM Medical Record Number: [id number] Patient Account Number: [id number] Date of Birth/Sex: [birth year] (83 y.o. Female) Treating RN: Cornell Barman Primary Care Turner Baillie: Derinda Late Other Clinician: Referring Lowella Kindley: BABAOFF, MARCUS Treating Desmen Schoffstall/Extender: Melburn Hake, HOYT Weeks in Treatment: 0 Encounter Discharge Information Items Discharge Pain Level: 2 Discharge Condition: Stable Ambulatory Status: Ambulatory Discharge Destination: Home Transportation: Private Auto Accompanied By: husband Schedule Follow-up Appointment: Yes Medication Reconciliation completed and provided to Patient/Care Yes Rolan Wrightsman: Provided on Clinical Summary of Care: 06/06/2017 Form Type Recipient Paper Patient MS Electronic Signature(s) Signed: 06/07/2017 10:14:57 AM By: Ruthine Dose Entered By: Ruthine Dose on 06/06/2017 09:20:17 [name] ([id number]) -------------------------------------------------------------------------------- Lower Extremity Assessment Details [name]. Date of Service: 06/06/2017 8:15 AM Medical Record Number: [id number] Patient Account Number: [id number] Date of Birth/Sex: 07-Jan-1926 (83 y.o. Female) Treating RN: Cornell Barman Primary Care Jenevieve Kirschbaum: BABAOFF, MARCUS Other Clinician: Referring Ayo Guarino: BABAOFF, MARCUS Treating Ollie Delano/Extender: STONE III, HOYT Weeks in Treatment: 0 Vascular Assessment Pulses: Dorsalis Pedis Palpable: [Left:Yes] Doppler Audible: [Left:Yes] Posterior Tibial Palpable: [Left:Yes] Doppler Audible: [Left:Yes] Extremity colors, hair growth, and conditions: Extremity Color: [Left:Red] Hair Growth on Extremity: [Left:Yes] Temperature of Extremity: [Left:Warm] Capillary Refill: [Left:< 3 seconds] Dependent Rubor: [Left:No] Blanched when Elevated: [Left:No] Lipodermatosclerosis: [Left:No] Toe Nail Assessment Left: Right: Thick: No Discolored: No Deformed: No Improper Length and Hygiene: No Notes ABI cannot be done today due to pain and wound location. Electronic Signature(s) Signed: 06/06/2017 9:29:21 AM By: Gretta Cool, BSN, RN, [name], Kim RN,  BSN Entered By: Gretta Cool, BSN, RN, [name], Kim on 06/06/2017 08:36:17 [name] ([id number]) -------------------------------------------------------------------------------- Multi Wound Chart Details Patient Name: JANALYNN, EDER. Date of Service: 06/06/2017 8:15 AM Medical Record Number: [id number] Patient Account Number: [id number] Date of Birth/Sex: [birth year] (83 y.o. Female) Treating RN: Cornell Barman Primary Care Rilynne Lonsway: BABAOFF, MARCUS Other Clinician: Referring Tawonna Esquer: BABAOFF, MARCUS Treating Saatvik Thielman/Extender: STONE III, HOYT Weeks in Treatment: 0 Vital Signs Height(in): 63 Pulse(bpm): 97 Weight(lbs): 99 Blood Pressure 180/116 (mmHg): Body Mass Index(BMI): 18 Temperature(F): 97.9 Respiratory Rate 16 (breaths/min):  Photos: [N/A:N/A] Wound Location: Left Lower Leg - Medial N/A N/A Wounding Event: Trauma N/A N/A Primary Etiology: Trauma, Other N/A N/A Date Acquired: 05/06/2017 N/A N/A Weeks of Treatment: 0 N/A N/A Wound Status: Open N/A N/A Measurements L x W x D 1.5x1.2x0.1 N/A N/A (cm) Area (cm) : 1.414 N/A N/A Volume (cm) : 0.141 N/A N/A Classification: Partial Thickness N/A N/A Exudate Amount: Large N/A N/A Exudate Type: Serosanguineous N/A N/A Exudate Color: red, brown N/A N/A Wound Margin: Flat and Intact N/A N/A Granulation Amount: None Present (0%) N/A N/A Necrotic Amount: Large (67-100%) N/A N/A Necrotic Tissue: Eschar, Adherent Slough N/A N/A Exposed Structures: Fat Layer (Subcutaneous N/A N/A Tissue) Exposed: Yes Fascia: No Tendon: No Muscle: No Rane, Jeilani C. ([id number]) Joint: No Bone: No Epithelialization: None N/A N/A Periwound Skin Texture: Excoriation: Yes N/A N/A Periwound Skin No Abnormalities Noted N/A N/A Moisture: Periwound Skin Color: Ecchymosis: Yes N/A N/A Tenderness on No N/A N/A Palpation: Wound Preparation: Ulcer Cleansing: N/A N/A Rinsed/Irrigated with Saline Topical Anesthetic Applied: Other:  lidocaine 4% Treatment Notes Electronic Signature(s) Signed: 06/06/2017 9:29:21 AM By: Gretta Cool, BSN, RN, [name], Kim RN, BSN Entered By: Gretta Cool, BSN, RN, [name], Kim on 06/06/2017 09:02:40 [name] ([id number]) -------------------------------------------------------------------------------- Multi-Disciplinary Care Plan Details Patient Name: RIVER, MCKERCHER. Date of Service: 06/06/2017 8:15 AM Medical Record Number: [id number] Patient Account Number: [id number] Date of Birth/Sex: 04/19/1926 (83 y.o. Female) Treating RN: Cornell Barman Primary Care Dorinne Graeff: Derinda Late Other Clinician: Referring Lashanta Elbe: Derinda Late Treating Zannie Runkle/Extender: Melburn Hake, HOYT Weeks in Treatment: 0 Active Inactive Electronic Signature(s) Signed: 06/06/2017 9:29:21 AM By: Gretta Cool, BSN, RN, [name], Kim RN, BSN Entered By: Gretta Cool, BSN, RN, [name], Kim on 06/06/2017 09:02:23 [name] ([id number]) -------------------------------------------------------------------------------- Pain Assessment Details Patient Name: LENITA, PEREGRINA. Date of Service: 06/06/2017 8:15 AM Medical Record Number: [id number] Patient Account Number: [id number] Date of Birth/Sex: [birth year] (83 y.o. Female) Treating RN: Cornell Barman Primary Care Kimyetta Flott: Derinda Late Other Clinician: Referring Devantae Babe: BABAOFF, MARCUS Treating Phong Isenberg/Extender: STONE III, HOYT Weeks in Treatment: 0 Active Problems Location of Pain Severity and Description of Pain Patient Has Paino Yes Site Locations Pain Location: Generalized Pain With Dressing Change: Yes Character of Pain Describe the Pain: Heavy, Shooting Pain Management and Medication Current Pain Management: Goals for Pain Management Topical or injectable lidocaine is offered to patient for acute pain when surgical debridement is performed. If needed, Patient is instructed to use over the counter pain medication for the following 24-48 hours after debridement. Wound care MDs  do not prescribed pain medications. Patient has chronic pain or uncontrolled pain. Patient has been instructed to make an appointment with their Primary Care Physician for pain management. Electronic Signature(s) Signed: 06/06/2017 9:29:21 AM By: Gretta Cool, BSN, RN, [name], Kim RN, BSN Entered By: Gretta Cool, BSN, RN, [name], Kim on 06/06/2017 50:27:74 [name] ([id number]) -------------------------------------------------------------------------------- Patient/Caregiver Education Details [name] Date of Service: 06/06/2017 8:15 AM Medical Record Number: [id number] Patient Account Number: [id number] Date of Birth/Gender: [birth year] (83 y.o. Female) Treating RN: Cornell Barman Primary Care Physician: Derinda Late Other Clinician: Referring Physician: Derinda Late Treating Physician/Extender: Worthy Keeler Weeks in Treatment: 0 Education Assessment Education Provided To: Patient Education Topics Provided Wound/Skin Impairment: Handouts: Caring for Your Ulcer Methods: Demonstration Responses: State content correctly Electronic Signature(s) Signed: 06/06/2017 9:29:21 AM By: Gretta Cool, BSN, RN, [name], Kim RN, BSN Entered By: Gretta Cool, BSN, RN, [name], Kim on 06/06/2017 09:12:20 [name] ([id number]) -------------------------------------------------------------------------------- Wound Assessment Details Patient Name: DANEY, MOOR.  Date of Service: 06/06/2017 8:15 AM Medical Record Number: [id number] Patient Account Number: [id number] Date of Birth/Sex: October 15, 1926 (83 y.o. Female) Treating RN: Cornell Barman Primary Care Lorella Gomez: BABAOFF, MARCUS Other Clinician: Referring Preslei Blakley: BABAOFF, MARCUS Treating Swati Granberry/Extender: STONE III, HOYT Weeks in Treatment: 0 Wound Status Wound Number: 1 Primary Etiology: Trauma, Other Wound Location: Left Lower Leg - Medial Wound Status: Open Wounding Event: Trauma Date Acquired: 05/06/2017 Weeks Of Treatment: 0 Clustered  Wound: No Photos Wound Measurements Length: (cm) 1.5 Width: (cm) 1.2 Depth: (cm) 0.1 Area: (cm) 1.414 Volume: (cm) 0.141 % Reduction in Area: % Reduction in Volume: Epithelialization: None Tunneling: No Undermining: No Wound Description Classification: Partial Thickness Wound Margin: Flat and Intact Exudate Amount: Large Exudate Type: Serosanguineous Exudate Color: red, brown Foul Odor After Cleansing: No Slough/Fibrino No Wound Bed Granulation Amount: None Present (0%) Exposed Structure Necrotic Amount: Large (67-100%) Fascia Exposed: No Necrotic Quality: Eschar, Adherent Slough Fat Layer (Subcutaneous Tissue) Exposed: Yes Tendon Exposed: No Muscle Exposed: No Joint Exposed: No Bone Exposed: No Vespa, Steffi C. ([id number]) Periwound Skin Texture Texture Color No Abnormalities Noted: No No Abnormalities Noted: No Excoriation: Yes Ecchymosis: Yes Moisture No Abnormalities Noted: No Wound Preparation Ulcer Cleansing: Rinsed/Irrigated with Saline Topical Anesthetic Applied: Other: lidocaine 4%, Treatment Notes Wound #1 (Left, Medial Lower Leg) 1. Cleansed with: Clean wound with Normal Saline 2. Anesthetic Topical Lidocaine 4% cream to wound bed prior to debridement 4. Dressing Applied: Aquacel Ag Mepitel 5. Secondary Dressing Applied ABD and Kerlix/Conform Electronic Signature(s) Signed: 06/06/2017 9:29:21 AM By: Gretta Cool, BSN, RN, [name], Kim RN, BSN Entered By: Gretta Cool, BSN, RN, [name], Kim on 06/06/2017 08:32:51 [name] ([id number]) -------------------------------------------------------------------------------- Vitals Details [name] Date of Service: 06/06/2017 8:15 AM Medical Record Number: [id number] Patient Account Number: [id number] Date of Birth/Sex: 26-Jun-1926 (83 y.o. Female) Treating RN: Cornell Barman Primary Care Ayslin Kundert: BABAOFF, MARCUS Other Clinician: Referring Keela Rubert: BABAOFF, MARCUS Treating  Ellamay Fors/Extender: STONE III, HOYT Weeks in Treatment: 0 Vital Signs Time Taken: 08:28 Temperature (F): 97.9 Height (in): 63 Pulse (bpm): 97 Source: Measured Respiratory Rate (breaths/min): 16 Weight (lbs): 99 Blood Pressure (mmHg): 180/116 Source: Measured Reference Range: 80 - 120 mg / dl Body Mass Index (BMI): 17.5 Notes Patient states she has not taken her medications this morning. Patient states BP yesterday was 138/87 in PCP's office Electronic Signature(s) Signed: 06/06/2017 9:29:21 AM By: Gretta Cool, BSN, RN, [name], Kim RN, BSN Entered By: Gretta Cool, BSN, RN, [name], Kim on 06/06/2017 08:29:51

## 2017-06-12 LAB — AEROBIC CULTURE  (SUPERFICIAL SPECIMEN)

## 2017-06-12 LAB — AEROBIC CULTURE W GRAM STAIN (SUPERFICIAL SPECIMEN)

## 2017-06-20 ENCOUNTER — Encounter: Payer: Medicare Other | Admitting: Internal Medicine

## 2017-06-20 DIAGNOSIS — L97822 Non-pressure chronic ulcer of other part of left lower leg with fat layer exposed: Secondary | ICD-10-CM | POA: Diagnosis not present

## 2017-06-22 NOTE — Progress Notes (Signed)
[name] ([id number]) Visit Report for 06/20/2017 Debridement Details [name], [name]. Date of Service: 06/20/2017 11:15 AM Medical Record Patient Account Number: [id number] Number: Treating RN: Cornell Barman 23-Jan-1926 (83 y.o. Other Clinician: Date of Birth/Sex: Female) Treating Harmoni Lucus Primary Care Provider: BABAOFF, MARCUS Provider/Extender: G Referring Provider: BABAOFF, MARCUS Weeks in Treatment: 2 Debridement Performed for Wound #1 Left,Medial Lower Leg Assessment: Performed By: Physician Ricard Dillon, MD Debridement: Debridement Pre-procedure Verification/Time Out Yes - 11:43 Taken: Start Time: 11:44 Pain Control: Lidocaine 4% Topical Solution Level: Skin/Subcutaneous Tissue Total Area Debrided (L x 4 (cm) x 1.2 (cm) = 4.8 (cm) W): Tissue and other Viable, Non-Viable, Exudate, Fibrin/Slough, Subcutaneous material debrided: Instrument: Curette Bleeding: Minimum Hemostasis Achieved: Pressure End Time: 11:47 Procedural Pain: 0 Post Procedural Pain: 0 Response to Treatment: Procedure was tolerated well Post Debridement Measurements of Total Wound Length: (cm) 2.3 Width: (cm) 1.2 Depth: (cm) 0.2 Volume: (cm) 0.434 Character of Wound/Ulcer Post Requires Further Debridement Debridement: Post Procedure Diagnosis Same as Pre-procedure Electronic Signature(s) ANGLIA, BLAKLEY ([id number]) Signed: 06/20/2017 5:01:41 PM By: Linton Ham MD Signed: 06/20/2017 5:49:32 PM By: Gretta Cool, BSN, RN, [name], Kim RN, BSN Entered By: Linton Ham on 06/20/2017 12:08:08 [name] ([id number]) -------------------------------------------------------------------------------- HPI Details Patient Name: [name], [name] Date of Service: 06/20/2017 11:15 AM Medical Record Patient Account Number: [id number] Number: Treating RN: Cornell Barman August 07, 1926 (83 y.o. Other Clinician: Date of Birth/Sex: Female) Treating  Currie Dennin Primary Care Provider: BABAOFF, MARCUS Provider/Extender: G Referring Provider: BABAOFF, MARCUS Weeks in Treatment: 2 History of Present Illness HPI Description: 06/06/17 on evaluation today patient presents for evaluation concerning an ulcer which she initially had arise as a result of striking her left lower extremity money bed frame. With that being said this was roughly one month ago and unfortunately the wound despite two courses of Keflex have not really made a dramatic improvement. This has continued to drain as well as being exquisitely tender at times. Even to the point that she is having a difficult time walking. Patient does have chronic atrial fibrillation which subsequently leads to her being on long-term anticoagulant therapy and this causes ED bruising which may have contributed to this entry and where things are at this point. She does have valvular heart disease as well as hypertension and her last INR was 2.7. Currently Neosporin, Gauls, and an ace wrap has been utilized. Keflex is the antibiotic that she has been on. Patient has not had a culture up to this point and she states that her blood pressure at the primary care office yesterday was 138/72. Her pain is significant related to be an eight out of 10. No fevers, chills, nausea, or vomiting noted at this time. 06/20/17; patient was admitted here 2 weeks ago. She had a traumatic wound on her left lateral lower leg probably in the setting of some degree of venous insufficiency with surrounding cellulitis. She had been on Keflex, after we saw her she was put on doxycycline which she apparently did not tolerate. Culture that was done at the time showed Morganella which was resistant to Keflex. Apparently the patient continued to take Keflex after the appointment. She is also on Coumadin. I had tried to change her antibiotic when the Morganella culture was brought to my attention however apparently our staff  could not reach the patient to inform them of the antibiotic change Electronic Signature(s) Signed: 06/20/2017 5:01:41 PM By: Linton Ham MD Entered By: Linton Ham on 06/20/2017  12:10:00 ELSYE, MCCOLLISTER ([id number]) -------------------------------------------------------------------------------- Physical Exam Details Patient Name: [name], [name] Date of Service: 06/20/2017 11:15 AM Medical Record Patient Account Number: [id number] Number: Treating RN: Cornell Barman 1926/09/15 (83 y.o. Other Clinician: Date of Birth/Sex: Female) Treating Jiyaan Steinhauser Primary Care Provider: BABAOFF, MARCUS Provider/Extender: G Referring Provider: BABAOFF, MARCUS Weeks in Treatment: 2 Constitutional Patient is hypertensive.. Pulse regular and within target range for patient.Marland Kitchen Respirations regular, non-labored and within target range.. Temperature is normal and within the target range for the patient.Marland Kitchen appears in no distress. Respiratory Respiratory effort is easy and symmetric bilaterally. Rate is normal at rest and on room air.. Cardiovascular Pedal pulses palpable on the left both dorsalis pedis and posterior tibial. Notes Wound exam; continued significant erythema and tenderness around this wound. There is no purulent drainage. The wound has an adherent black surface eschar. Using a #5 curet this was debrided with some difficulty of the surface eschar and nonviable subcutaneous tissue Electronic Signature(s) Signed: 06/20/2017 5:01:41 PM By: Linton Ham MD Entered By: Linton Ham on 06/20/2017 12:11:27 [name] ([id number]) -------------------------------------------------------------------------------- Physician Orders Details [name]. Date of Service: 06/20/2017 11:15 AM Medical Record Patient Account Number: [id number] Number: Treating RN: Ahmed Prima [birth year] (83 y.o. Other Clinician: Date of  Birth/Sex: Female) Treating Francisca Langenderfer Primary Care Provider: BABAOFF, MARCUS Provider/Extender: G Referring Provider: BABAOFF, MARCUS Weeks in Treatment: 2 Verbal / Phone Orders: No Diagnosis Coding Wound Cleansing o Cleanse wound with mild soap and water o May Shower, gently pat wound dry prior to applying new dressing. Anesthetic Wound #1 Left,Medial Lower Leg o Topical Lidocaine 4% cream applied to wound bed prior to debridement Primary Wound Dressing Wound #1 Left,Medial Lower Leg o Mepitel One Contact layer o Aquacel Ag Secondary Dressing Wound #1 Left,Medial Lower Leg o ABD and Kerlix/Conform o Other - stretch netting #3 Dressing Change Frequency Wound #1 Left,Medial Lower Leg o Change dressing every other day. Follow-up Appointments Wound #1 Left,Medial Lower Leg o Return Appointment in 2 weeks. Edema Control Wound #1 Left,Medial Lower Leg o Elevate legs to the level of the heart and pump ankles as often as possible Additional Orders / Instructions Wound #1 Left,Medial Lower Leg o Increase protein intake. o Other: - Check Coumadin levels on early next week. NYLANI, MICHETTI ([id number]) Patient Medications Allergies: No Known Drug Allergies Notifications Medication Indication Start End cefdinir cellulitis lt leg 06/20/2017 DOSE oral 300 mg capsule - 1 capsule oral bid for 7 days Electronic Signature(s) Signed: 06/20/2017 12:03:02 PM By: Linton Ham MD Entered By: Linton Ham on 06/20/2017 12:03:02 [name] ([id number]) -------------------------------------------------------------------------------- Problem List Details [name]. Date of Service: 06/20/2017 11:15 AM Medical Record Patient Account Number: [id number] Number: Treating RN: Cornell Barman [birth year] (83 y.o. 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 Date of Service: 06/20/2017 11:15 AM Medical Record Patient Account Number: [id number]  Number: Treating RN: Cornell Barman 05-Sep-1926 (83 y.o. Other Clinician: Date of Birth/Sex: Female) Treating Rosaleah Person Primary Care Provider: BABAOFF, MARCUS Provider/Extender: G Referring Provider: BABAOFF, MARCUS Weeks in Treatment: 2 Subjective History of Present Illness (HPI) 06/06/17 on evaluation today patient presents for evaluation concerning an ulcer which she initially had arise as a result of striking her left lower extremity money bed frame. With that being said this was roughly one month ago and unfortunately the wound despite two courses of Keflex have not really made a dramatic improvement. This has continued to drain as well as being exquisitely tender at times. Even to the point that she is having a difficult time walking. Patient does have chronic atrial fibrillation which subsequently leads to her being on long-term anticoagulant therapy and this causes ED bruising which may have contributed to this entry and where things are at this point. She does have valvular heart disease as well as hypertension and her last INR  was 2.7. Currently Neosporin, Gauls, and an ace wrap has been utilized. Keflex is the antibiotic that she has been on. Patient has not had a culture up to this point and she states that her blood pressure at the primary care office yesterday was 138/72. Her pain is significant related to be an eight out of 10. No fevers, chills, nausea, or vomiting noted at this time. 06/20/17; patient was admitted here 2 weeks ago. She had a traumatic wound on her left lateral lower leg probably in the setting of some degree of venous insufficiency with surrounding cellulitis. She had been on Keflex, after we saw her she was put on doxycycline which she apparently did not tolerate. Culture that was done at the time showed Morganella which was resistant to Keflex. Apparently the patient continued to take Keflex after the appointment. She is also on Coumadin. I had tried to change her antibiotic when the Morganella culture was brought to my attention however apparently our staff could not reach the patient to inform them of the antibiotic change Objective Constitutional Patient is hypertensive.. Pulse regular and within target range for patient.Marland Kitchen Respirations regular, non-labored and within target range.. Temperature is normal and within the target range for the patient.Marland Kitchen appears in no distress. Vitals Time Taken: 11:20 AM, Height: 63 in, Weight: 99 lbs, BMI: 17.5, Temperature: 97.8 F, Pulse: 61 bpm, Respiratory Rate: 16 breaths/min, Blood Pressure: 173/102 mmHg. LASHUNDRA, SHIVELEY ([id number]) General Notes: Pt states that she is taking her medication. Made Dr. Dellia Nims aware of pts BP. Respiratory Respiratory effort is easy and symmetric bilaterally. Rate is normal at rest and on room air.. Cardiovascular Pedal pulses palpable on the left both dorsalis pedis and posterior tibial. General Notes: Wound exam; continued significant erythema and tenderness around this wound. There is no purulent drainage. The  wound has an adherent black surface eschar. Using a #5 curet this was debrided with some difficulty of the surface eschar and nonviable subcutaneous tissue Integumentary (Hair, Skin) Wound #1 status is Open. Original cause of wound was Trauma. The wound is located on the Left,Medial Lower Leg. The wound measures 4cm length x 1.2cm width x 0.1cm depth; 3.77cm^2 area and 0.377cm^3 volume. There is Fat Layer (Subcutaneous Tissue) Exposed exposed. There is no tunneling or undermining noted. There is a large amount of serosanguineous drainage noted. The wound margin is flat and intact. There is no granulation within the wound bed. There is a large (67-100%) amount of necrotic tissue within the wound bed including Eschar and Adherent  Slough. The periwound skin appearance exhibited: Excoriation, Ecchymosis, Erythema. The surrounding wound skin color is noted with erythema which is circumferential. Periwound temperature was noted as No Abnormality. The periwound has tenderness on palpation. 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 There was a Skin/Subcutaneous Tissue Debridement ([id number]) debridement with total area of 4.8 sq cm performed by Ricard Dillon, MD. with the following instrument(s): Curette to remove Viable and Non-Viable tissue/material including Exudate, Fibrin/Slough, and Subcutaneous after achieving pain control Record, Kitty C. ([id number]) using Lidocaine 4% Topical Solution. A time out was conducted at 11:43, prior to the start of the procedure. A Minimum amount of bleeding was controlled with Pressure. The procedure was tolerated well with a  pain level of 0 throughout and a pain level of 0 following the procedure. Post Debridement Measurements: 2.3cm length x 1.2cm width x 0.2cm depth; 0.434cm^3 volume. Character of Wound/Ulcer Post Debridement requires further debridement. Post procedure Diagnosis Wound #1: Same as Pre-Procedure Plan Wound Cleansing: Cleanse wound with mild soap and water May Shower, gently pat wound dry prior to applying new dressing. Anesthetic: Wound #1 Left,Medial Lower Leg: Topical Lidocaine 4% cream applied to wound bed prior to debridement Primary Wound Dressing: Wound #1 Left,Medial Lower Leg: Mepitel One Contact layer Aquacel Ag Secondary Dressing: Wound #1 Left,Medial Lower Leg: ABD and Kerlix/Conform Other - stretch netting #3 Dressing Change Frequency: Wound #1 Left,Medial Lower Leg: Change dressing every other day. Follow-up Appointments: Wound #1 Left,Medial Lower Leg: Return Appointment in 2 weeks. Edema Control: Wound #1 Left,Medial Lower Leg: Elevate legs to the level of the heart and pump ankles as often as possible Additional Orders / Instructions: Wound #1 Left,Medial Lower Leg: Increase protein intake. Other: - Check Coumadin levels on early next week. The following medication(s) was prescribed: cefdinir oral 300 mg capsule 1 capsule oral bid for 7 days for cellulitis lt leg starting 06/20/2017 ABRAR, KOONE ([id number]) #1 cefdinir 300 twice a day for 7 days E scribed today #2 the original culture grew Morganella which was resistant to cefazolin, not plated against doxycycline however the patient could not tolerate this in any case. #3 the culture also grewa few Stenotropomonas Maltophilia, significance of this is never really clear to me #4 silver alginate as the primary dressing #5 the patient is traveling next week however I managed to get them in to come in and see me on Tuesday morning before they leave. The Labor Day holiday makes checking PT and INRs  difficult Electronic Signature(s) Signed: 06/20/2017 5:01:41 PM By: Linton Ham MD Entered By: Linton Ham on 06/20/2017 12:14:06 [name] ([id number]) -------------------------------------------------------------------------------- SuperBill Details [name] Date of Service: 06/20/2017 Medical Record Patient Account Number: [id number] Number: Treating RN: Cornell Barman 25-Dec-1925 (83 y.o. Other Clinician: Date of Birth/Sex: Female) Treating Markeem Noreen Primary Care Provider: BABAOFF, MARCUS Provider/Extender: G Referring Provider: BABAOFF, MARCUS Weeks in Treatment: 2 Diagnosis Coding ICD-10 Codes Code Description L03.116 Cellulitis of left lower limb L97.822 Non-pressure chronic ulcer of other part of left lower leg with fat layer exposed I48.0 Paroxysmal atrial fibrillation Z79.01 Long term (current) use of anticoagulants Facility Procedures CPT4: Description Modifier Quantity Code [id number] 11042 - DEB SUBQ TISSUE  20 SQ CM/< 1 ICD-10 Description Diagnosis L97.822 Non-pressure chronic ulcer of other part of left lower leg with fat layer exposed Physician Procedures CPT4: Description Modifier Quantity Code [id number] - WC PHYS SUBQ TISS 20 SQ CM 1 ICD-10 Description Diagnosis L97.822 Non-pressure chronic ulcer of other part of left lower leg with fat layer exposed Electronic Signature(s) Signed: 06/20/2017 5:01:41 PM By: Linton Ham MD Entered By: Linton Ham on 06/20/2017 12:14:59

## 2017-06-24 NOTE — Progress Notes (Signed)
KAROLINA, ZAMOR ([id number]) Visit Report for 06/20/2017 Arrival Information Details Patient Name: Brittany, [name]. Date of Service: 06/20/2017 11:15 AM Medical Record Patient Account Number: [id number] Number: Treating RN: Ahmed Prima 1926-10-01 (83 y.o. Other Clinician: Date of Birth/Sex: Female) Treating ROBSON, MICHAEL Primary Care Song Garris: BABAOFF, MARCUS Hesston Hitchens/Extender: G Referring Nadiyah Zeis: BABAOFF, MARCUS Weeks in Treatment: 2 Visit Information History Since Last Visit All ordered tests and consults were completed: No Patient Arrived: Ambulatory Added or deleted any medications: No Arrival Time: 11:17 Any new allergies or adverse reactions: No Accompanied By: husband Had a fall or experienced change in No Transfer Assistance: None activities of daily living that may affect Patient Identification Verified: Yes risk of falls: Secondary Verification Process Yes Signs or symptoms of abuse/neglect since last No Completed: visito Patient Requires Transmission- No Hospitalized since last visit: No Based Precautions: Has Dressing in Place as Prescribed: No Patient Has Alerts: Yes Pain Present Now: Yes Patient Alerts: Patient on Blood Thinner Coumadin No ABI DT pain/location Electronic Signature(s) Signed: 06/22/2017 4:51:13 PM By: Alric Quan Entered By: Alric Quan on 06/20/2017 11:20:13 [name] ([id number]) -------------------------------------------------------------------------------- Encounter Discharge Information Details [name]. Date of Service: 06/20/2017 11:15 AM Medical Record Patient Account Number: [id number] Number: Treating RN: Ahmed Prima 03-07-1926 (83 y.o. Other Clinician: Date of Birth/Sex: Female) Treating ROBSON, MICHAEL Primary Care Suraiya Dickerson: BABAOFF, MARCUS Tisheena Maguire/Extender: G Referring Lupie Sawa: BABAOFF, MARCUS Weeks in Treatment: 2 Encounter Discharge  Information Items Discharge Pain Level: 0 Discharge Condition: Stable Ambulatory Status: Ambulatory Discharge Destination: Home Transportation: Private Auto Accompanied By: husband Schedule Follow-up Appointment: Yes Medication Reconciliation completed and provided to Patient/Care No Destyn Parfitt: Provided on Clinical Summary of Care: 06/20/2017 Form Type Recipient Paper Patient [place] Electronic Signature(s) Signed: 06/21/2017 10:47:24 AM By: Ruthine Dose Entered By: Ruthine Dose on 06/20/2017 12:00:24 [name] ([id number]) -------------------------------------------------------------------------------- Lower Extremity Assessment Details [name]. Date of Service: 06/20/2017 11:15 AM Medical Record Patient Account Number: [id number] Number: Treating RN: Ahmed Prima [birth year] (83 y.o. Other Clinician: Date of Birth/Sex: Female) Treating ROBSON, MICHAEL Primary Care Mari Battaglia: BABAOFF, MARCUS Maejor Erven/Extender: G Referring Mariamawit Depaoli: BABAOFF, MARCUS Weeks in Treatment: 2 Vascular Assessment Pulses: Dorsalis Pedis Palpable: [Left:Yes] Posterior Tibial Extremity colors, hair growth, and conditions: Extremity Color: [Left:Red] Temperature of Extremity: [Left:Warm] Capillary Refill: [Left:< 3 seconds] Toe Nail Assessment Left: Right: Thick: No Discolored: No Deformed: No Improper Length and Hygiene: No Electronic Signature(s) Signed: 06/22/2017 4:51:13 PM By: Alric Quan Entered By: Alric Quan on 06/20/2017 11:40:00 [name], [name] -------------------------------------------------------------------------------- Multi Wound Chart Details [name]. Date of Service: 06/20/2017 11:15 AM Medical Record Patient Account Number: [id number] Number: Treating RN: Ahmed Prima October 07, 1926 (83 y.o. Other Clinician: Date of Birth/Sex: Female) Treating ROBSON, MICHAEL Primary Care  Nylen Creque: BABAOFF, MARCUS Ana Liaw/Extender: G Referring Marco Raper: BABAOFF, MARCUS Weeks in Treatment: 2 Vital Signs Height(in): 63 Pulse(bpm): 61 Weight(lbs): 99 Blood Pressure 173/102 (mmHg): Body Mass Index(BMI): 18 Temperature(F): 97.8 Respiratory Rate 16 (breaths/min): Photos: [1:No Photos] [N/A:N/A] Wound Location: [1:Left Lower Leg - Medial] [N/A:N/A] Wounding Event: [1:Trauma] [N/A:N/A] Primary Etiology: [1:Trauma, Other] [N/A:N/A] Comorbid History: [1:Cataracts, Arrhythmia, Hypertension, Osteoarthritis, Dementia] [N/A:N/A] Date Acquired: [1:05/06/2017] [N/A:N/A] Weeks of Treatment: [1:2] [N/A:N/A] Wound Status: [1:Open] [N/A:N/A] Measurements L x W x D 4x1.2x0.1 [N/A:N/A] (cm) Area (cm) : [1:3.77] [N/A:N/A] Volume (cm) : [1:0.377] [N/A:N/A] % Reduction in Area: [1:-166.60%] [N/A:N/A] % Reduction in Volume: -167.40% [N/A:N/A] Classification: [1:Partial Thickness] [N/A:N/A] Exudate Amount: [1:Large] [N/A:N/A] Exudate Type: [1:Serosanguineous] [N/A:N/A] Exudate  Color: [1:red, brown] [N/A:N/A] Wound Margin: [1:Flat and Intact] [N/A:N/A] Granulation Amount: [1:None Present (0%)] [N/A:N/A] Necrotic Amount: [1:Large (67-100%)] [N/A:N/A] Necrotic Tissue: [1:Eschar, Adherent Slough] [N/A:N/A] Exposed Structures: [1:Fat Layer (Subcutaneous Tissue) Exposed: Yes Fascia: No Tendon: No] [N/A:N/A] Muscle: No Joint: No Bone: No Epithelialization: None N/A N/A Debridement: Debridement (16109- N/A N/A 11047) Pre-procedure 11:43 N/A N/A Verification/Time Out Taken: Pain Control: Lidocaine 4% Topical N/A N/A Solution Tissue Debrided: Fibrin/Slough, Exudates, N/A N/A Subcutaneous Level: Skin/Subcutaneous N/A N/A Tissue Debridement Area (sq 4.8 N/A N/A cm): Instrument: Curette N/A N/A Bleeding: Minimum N/A N/A Hemostasis Achieved: Pressure N/A N/A Procedural Pain: 0 N/A N/A Post Procedural Pain: 0 N/A N/A Debridement Treatment Procedure was tolerated N/A  N/A Response: well Post Debridement 2.3x1.2x0.2 N/A N/A Measurements L x W x D (cm) Post Debridement 0.434 N/A N/A Volume: (cm) Periwound Skin Texture: Excoriation: Yes N/A N/A Periwound Skin No Abnormalities Noted N/A N/A Moisture: Periwound Skin Color: Ecchymosis: Yes N/A N/A Erythema: Yes Erythema Location: Circumferential N/A N/A Temperature: No Abnormality N/A N/A Tenderness on Yes N/A N/A Palpation: Wound Preparation: Ulcer Cleansing: N/A N/A Rinsed/Irrigated with Saline Topical Anesthetic Applied: Other: lidocaine 4% Procedures Performed: Debridement N/A N/A Treatment Notes Brittany, [name] ([id number]) Electronic Signature(s) Signed: 06/20/2017 5:01:41 PM By: Linton Ham MD Entered By: Linton Ham on 06/20/2017 12:07:52 Brittany, [name] ([id number]) -------------------------------------------------------------------------------- Multi-Disciplinary Care Plan Details Patient Name: Brittany, [name]. Date of Service: 06/20/2017 11:15 AM Medical Record Patient Account Number: [id number] Number: Treating RN: Ahmed Prima 03-05-1926 (83 y.o. Other Clinician: Date of Birth/Sex: Female) Treating ROBSON, MICHAEL Primary Care Maximum Reiland: BABAOFF, MARCUS Alta Goding/Extender: G Referring Azaylah Stailey: BABAOFF, MARCUS Weeks in Treatment: 2 Active Inactive Electronic Signature(s) Signed: 06/22/2017 4:51:13 PM By: Alric Quan Entered By: Alric Quan on 06/20/2017 11:40:06 Straub, [name] ([id number]) -------------------------------------------------------------------------------- Pain Assessment Details [name]. Date of Service: 06/20/2017 11:15 AM Medical Record Patient Account Number: [id number] Number: Treating RN: Ahmed Prima 1926-01-10 (83 y.o. Other Clinician: Date of Birth/Sex: Female) Treating ROBSON, MICHAEL Primary Care Kouper Spinella: BABAOFF, MARCUS Jonay Hitchcock/Extender: G Referring Lanisha Stepanian:  BABAOFF, MARCUS Weeks in Treatment: 2 Active Problems Location of Pain Severity and Description of Pain Patient Has Paino Yes Site Locations Pain Location: Pain in Ulcers Pain Management and Medication Current Pain Management: Notes Pt states that she is having pain but cant describe it or put a number on it. Electronic Signature(s) Signed: 06/22/2017 4:51:13 PM By: Alric Quan Entered By: Alric Quan on 06/20/2017 11:20:42 [name] ([id number]) -------------------------------------------------------------------------------- Patient/Caregiver Education Details [name] Date of Service: 06/20/2017 11:15 AM Medical Record Patient Account Number: [id number] Number: Treating RN: Ahmed Prima 1926-04-30 (83 y.o. Other Clinician: Date of Birth/Gender: Female) Treating ROBSON, MICHAEL Primary Care Physician: BABAOFF, MARCUS Physician/Extender: G Referring Physician: BABAOFF, MARCUS Weeks in Treatment: 2 Education Assessment Education Provided To: Patient Education Topics Provided Wound/Skin Impairment: Handouts: Other: change dressing as ordered Methods: Demonstration, Explain/Verbal Responses: State content correctly Electronic Signature(s) Signed: 06/22/2017 4:51:13 PM By: Alric Quan Entered By: Alric Quan on 06/20/2017 11:29:02 [name], [name] ([id number]) -------------------------------------------------------------------------------- Wound Assessment Details Patient Name: Brittany Lolling C. Date of Service: 06/20/2017 11:15 AM Medical Record Patient Account Number: [id number] Number: Treating RN: Ahmed Prima [birth year] (83 y.o. Other Clinician: Date of Birth/Sex: Female) Treating ROBSON, MICHAEL Primary Care Lorien Shingler: BABAOFF, MARCUS Ori Trejos/Extender: G Referring Analisa Sledd: BABAOFF, MARCUS Weeks in Treatment: 2 Wound Status Wound Number: 1 Primary Trauma, Other Etiology: Wound  Location: Left Lower Leg - Medial Wound Open Wounding  Event: Trauma Status: Date Acquired: 05/06/2017 Comorbid Cataracts, Arrhythmia, Hypertension, Weeks Of Treatment: 2 History: Osteoarthritis, Dementia Clustered Wound: No Photos Photo Uploaded By: Alric Quan on 06/22/2017 16:38:10 Wound Measurements Length: (cm) 4 Width: (cm) 1.2 Depth: (cm) 0.1 Area: (cm) 3.77 Volume: (cm) 0.377 % Reduction in Area: -166.6% % Reduction in Volume: -167.4% Epithelialization: None Tunneling: No Undermining: No Wound Description Classification: Partial Thickness Foul Odor Afte Wound Margin: Flat and Intact Slough/Fibrino Exudate Amount: Large Exudate Type: Serosanguineous Exudate Color: red, brown r Cleansing: No No Wound Bed Granulation Amount: None Present (0%) Exposed Structure Necrotic Amount: Large (67-100%) Fascia Exposed: No Necrotic Quality: Eschar, Adherent Slough Fat Layer (Subcutaneous Tissue) Exposed: Yes [name], [name] ([id number]) Tendon Exposed: No Muscle Exposed: No Joint Exposed: No Bone Exposed: No Periwound Skin Texture Texture Color No Abnormalities Noted: No No Abnormalities Noted: No Excoriation: Yes Ecchymosis: Yes Erythema: Yes Moisture Erythema Location: Circumferential No Abnormalities Noted: No Temperature / Pain Temperature: No Abnormality Tenderness on Palpation: Yes Wound Preparation Ulcer Cleansing: Rinsed/Irrigated with Saline Topical Anesthetic Applied: Other: lidocaine 4%, Treatment Notes Wound #1 (Left, Medial Lower Leg) 1. Cleansed with: Clean wound with Normal Saline 2. Anesthetic Topical Lidocaine 4% cream to wound bed prior to debridement 4. Dressing Applied: Aquacel Ag Mepitel 5. Secondary Dressing Applied ABD Pad Kerlix/Conform 7. Secured with Tape Notes netting Electronic Signature(s) Signed: 06/22/2017 4:51:13 PM By: Alric Quan Entered By: Alric Quan on 06/20/2017 11:27:15 [name]  ([id number]) -------------------------------------------------------------------------------- Vitals Details [name] Date of Service: 06/20/2017 11:15 AM Medical Record Patient Account Number: [id number] Number: Treating RN: Ahmed Prima May 09, 1926 (83 y.o. Other Clinician: Date of Birth/Sex: Female) Treating ROBSON, MICHAEL Primary Care Yared Susan: BABAOFF, MARCUS Charmane Protzman/Extender: G Referring Jose Alleyne: BABAOFF, MARCUS Weeks in Treatment: 2 Vital Signs Time Taken: 11:20 Temperature (F): 97.8 Height (in): 63 Pulse (bpm): 61 Weight (lbs): 99 Respiratory Rate (breaths/min): 16 Body Mass Index (BMI): 17.5 Blood Pressure (mmHg): 173/102 Reference Range: 80 - 120 mg / dl Notes Pt states that she is taking her medication. Made Dr. Dellia Nims aware of pts BP. Electronic Signature(s) Signed: 06/22/2017 4:51:13 PM By: Alric Quan Entered By: Alric Quan on 06/20/2017 11:22:25

## 2017-06-26 ENCOUNTER — Encounter: Payer: Medicare Other | Attending: Internal Medicine | Admitting: Internal Medicine

## 2017-06-26 DIAGNOSIS — L97822 Non-pressure chronic ulcer of other part of left lower leg with fat layer exposed: Secondary | ICD-10-CM | POA: Insufficient documentation

## 2017-06-26 DIAGNOSIS — Z7901 Long term (current) use of anticoagulants: Secondary | ICD-10-CM | POA: Insufficient documentation

## 2017-06-26 DIAGNOSIS — I480 Paroxysmal atrial fibrillation: Secondary | ICD-10-CM | POA: Diagnosis not present

## 2017-06-26 DIAGNOSIS — I10 Essential (primary) hypertension: Secondary | ICD-10-CM | POA: Insufficient documentation

## 2017-06-26 DIAGNOSIS — L03116 Cellulitis of left lower limb: Secondary | ICD-10-CM | POA: Insufficient documentation

## 2017-06-27 NOTE — Progress Notes (Signed)
MARESSA, [name] ([id number]) Visit Report for 06/26/2017 HPI Details [name], [name]. Date of Service: 06/26/2017 11:00 AM Medical Record Patient Account Number: [id number] Number: Treating RN: Cornell Barman [birth year] (83 y.o. Other Clinician: Date of Birth/Sex: Female) Treating Klein Willcox Primary Care Provider: BABAOFF, MARCUS Provider/Extender: G Referring Provider: BABAOFF, MARCUS Weeks in Treatment: 2 History of Present Illness HPI Description: 06/06/17 on evaluation today patient presents for evaluation concerning an ulcer which she initially had arise as a result of striking her left lower extremity money bed frame. With that being said this was roughly one month ago and unfortunately the wound despite two courses of Keflex have not really made a dramatic improvement. This has continued to drain as well as being exquisitely tender at times. Even to the point that she is having a difficult time walking. Patient does have chronic atrial fibrillation which subsequently leads to her being on long-term anticoagulant therapy and this causes ED bruising which may have contributed to this entry and where things are at this point. She does have valvular heart disease as well as hypertension and her last INR was 2.7. Currently Neosporin, Gauls, and an ace wrap has been utilized. Keflex is the antibiotic that she has been on. Patient has not had a culture up to this point and she states that her blood pressure at the primary care office yesterday was 138/72. Her pain is significant related to be an eight out of 10. No fevers, chills, nausea, or vomiting noted at this time. 06/20/17; patient was admitted here 2 weeks ago. She had a traumatic wound on her left lateral lower leg probably in the setting of some degree of venous insufficiency with surrounding cellulitis. She had been on Keflex, after we saw her she was put on doxycycline which she apparently did not  tolerate. Culture that was done at the time showed Morganella which was resistant to Keflex. Apparently the patient continued to take Keflex after the appointment. She is also on Coumadin. I had tried to change her antibiotic when the Morganella culture was brought to my attention however apparently our staff could not reach the patient to inform them of the antibiotic change 06/26/17; patient has wound on her left lateral lower extremity in the setting of some degree of venous insufficiency. I gave her cefdinir last week and she is completing this. There is no evidence of surrounding infection. Aggressive debridement last week, wound bed looks healthier. We've been using Aquacel. They're traveling in the [place] next week we'll see her back in 2 weeks unless there are problems Electronic Signature(s) Signed: 06/27/2017 4:30:21 AM By: Linton Ham MD Entered By: Linton Ham on 06/26/2017 12:01:40 [name] ([id number]) -------------------------------------------------------------------------------- Physical Exam Details Patient Name: Brittany, [name] Date of Service: 06/26/2017 11:00 AM Medical Record Patient Account Number: [id number] Number: Treating RN: Cornell Barman [birth year] (83 y.o. Other Clinician: Date of Birth/Sex: Female) Treating Rojelio Uhrich Primary Care Provider: BABAOFF, MARCUS Provider/Extender: G Referring Provider: BABAOFF, MARCUS Weeks in Treatment: 2 Constitutional Patient is hypertensive.. Pulse regular and within target range for patient.Marland Kitchen Respirations regular, non-labored and within target range.. Temperature is normal and within the target range for the patient.Marland Kitchen appears in no distress. Eyes Conjunctivae clear. No discharge. Cardiovascular Pedal pulses. There is no edema. Lymphatic None palpable in the popliteal or inguinal area. Integumentary (Hair, Skin) There is no evidence of surrounding erythema in the wound. Some evidence of  chronic venous insufficiency. Psychiatric No evidence of depression, anxiety, or  agitation. Calm, cooperative, and communicative. Appropriate interactions and affect.. Notes Wound exam; the erythema around the wound from last week is a lot better. There is no purulent drainage. Wound surface looks better there is some inferior eschar which I did not debride Electronic Signature(s) Signed: 06/27/2017 4:30:21 AM By: Linton Ham MD Entered By: Linton Ham on 06/26/2017 12:25:24 [name] ([id number]) -------------------------------------------------------------------------------- Physician Orders Details Patient Name: Brittany, [name] C. Date of Service: 06/26/2017 11:00 AM Medical Record Patient Account Number: [id number] Number: Treating RN: Cornell Barman 21-Jul-1926 (83 y.o. Other Clinician: Date of Birth/Sex: Female) Treating Maykayla Highley Primary Care Provider: BABAOFF, MARCUS Provider/Extender: G Referring Provider: BABAOFF, MARCUS Weeks in Treatment: 2 Verbal / Phone Orders: No Diagnosis Coding ICD-10 Coding Code Description L03.116 Cellulitis of left lower limb L97.822 Non-pressure chronic ulcer of other part of left lower leg with fat layer exposed I48.0 Paroxysmal atrial fibrillation Z79.01 Long term (current) use of anticoagulants Wound Cleansing Wound #1 Left,Medial Lower Leg o Cleanse wound with mild soap and water o May Shower, gently pat wound dry prior to applying new dressing. Anesthetic Wound #1 Left,Medial Lower Leg o Topical Lidocaine 4% cream applied to wound bed prior to debridement Primary Wound Dressing Wound #1 Left,Medial Lower Leg o Mepitel One Contact layer o Aquacel Ag Secondary Dressing Wound #1 Left,Medial Lower Leg o ABD and Kerlix/Conform o Other - stretch netting #3 Dressing Change Frequency Wound #1 Left,Medial Lower Leg o Change dressing every other day. Follow-up Appointments Wound #1 Left,Medial  Lower Leg o Return Appointment in 2 weeks. EVELEN, VAZGUEZ ([id number]) Edema Control Wound #1 Left,Medial Lower Leg o Elevate legs to the level of the heart and pump ankles as often as possible Additional Orders / Instructions Wound #1 Left,Medial Lower Leg o Increase protein intake. o Other: - Check Coumadin levels on early next week. Electronic Signature(s) Signed: 06/26/2017 5:20:16 PM By: Gretta Cool, BSN, RN, [name], Kim RN, BSN Signed: 06/27/2017 4:30:21 AM By: Linton Ham MD Entered By: Gretta Cool, BSN, RN, [name], Kim on 06/26/2017 16:45:42 [name] ([id number]) -------------------------------------------------------------------------------- Problem List Details Patient Name: MAIRLYN, TEGTMEYER. Date of Service: 06/26/2017 11:00 AM Medical Record Patient Account Number: [id number] Number: Treating RN: Cornell Barman 11-Feb-1926 (83 y.o. Other Clinician: Date of Birth/Sex: Female) Treating Junior Kenedy Primary Care Provider: BABAOFF, MARCUS Provider/Extender: G Referring Provider: BABAOFF, MARCUS Weeks in Treatment: 2 Active Problems ICD-10 Encounter Code Description Active Date Diagnosis L03.116 Cellulitis of left lower limb 06/07/2017 Yes L97.822 Non-pressure chronic ulcer of other part of left lower leg 06/07/2017 Yes with fat layer exposed I48.0 Paroxysmal atrial fibrillation 06/07/2017 Yes Z79.01 Long term (current) use of anticoagulants 06/07/2017 Yes Inactive Problems Resolved Problems Electronic Signature(s) Signed: 06/27/2017 4:30:21 AM By: Linton Ham MD Entered By: Linton Ham on 06/26/2017 11:58:40 Majeed, [name] ([id number]) -------------------------------------------------------------------------------- Progress Note Details Patient Name: [name]. Date of Service: 06/26/2017 11:00 AM Medical Record Patient Account Number: [id number] Number: Treating RN: Cornell Barman [birth year] (83 y.o. Other Clinician: Date of  Birth/Sex: Female) Treating Ihsan Nomura Primary Care Provider: BABAOFF, MARCUS Provider/Extender: G Referring Provider: BABAOFF, MARCUS Weeks in Treatment: 2 Subjective History of Present Illness (HPI) 06/06/17 on evaluation today patient presents for evaluation concerning an ulcer which she initially had arise as a result of striking her left lower extremity money bed frame. With that being said this was roughly one month ago and unfortunately the wound despite two courses of Keflex have not really made a dramatic improvement. This has continued to  drain as well as being exquisitely tender at times. Even to the point that she is having a difficult time walking. Patient does have chronic atrial fibrillation which subsequently leads to her being on long-term anticoagulant therapy and this causes ED bruising which may have contributed to this entry and where things are at this point. She does have valvular heart disease as well as hypertension and her last INR was 2.7. Currently Neosporin, Gauls, and an ace wrap has been utilized. Keflex is the antibiotic that she has been on. Patient has not had a culture up to this point and she states that her blood pressure at the primary care office yesterday was 138/72. Her pain is significant related to be an eight out of 10. No fevers, chills, nausea, or vomiting noted at this time. 06/20/17; patient was admitted here 2 weeks ago. She had a traumatic wound on her left lateral lower leg probably in the setting of some degree of venous insufficiency with surrounding cellulitis. She had been on Keflex, after we saw her she was put on doxycycline which she apparently did not tolerate. Culture that was done at the time showed Morganella which was resistant to Keflex. Apparently the patient continued to take Keflex after the appointment. She is also on Coumadin. I had tried to change her antibiotic when the Morganella culture was brought to my attention  however apparently our staff could not reach the patient to inform them of the antibiotic change 06/26/17; patient has wound on her left lateral lower extremity in the setting of some degree of venous insufficiency. I gave her cefdinir last week and she is completing this. There is no evidence of surrounding infection. Aggressive debridement last week, wound bed looks healthier. We've been using Aquacel. They're traveling in the [place] next week we'll see her back in 2 weeks unless there are problems Objective Constitutional Patient is hypertensive.. Pulse regular and within target range for patient.Marland Kitchen Respirations regular, non-labored and within target range.. Temperature is normal and within the target range for the patient.Marland Kitchen appears in no Reeves, Starlee C. ([id number]) distress. Vitals Time Taken: 11:27 AM, Height: 63 in, Weight: 99 lbs, BMI: 17.5, Temperature: 97.7 F, Pulse: 90 bpm, Respiratory Rate: 16 breaths/min, Blood Pressure: 174/87 mmHg. Eyes Conjunctivae clear. No discharge. Cardiovascular Pedal pulses. There is no edema. Lymphatic None palpable in the popliteal or inguinal area. Psychiatric No evidence of depression, anxiety, or agitation. Calm, cooperative, and communicative. Appropriate interactions and affect.. General Notes: Wound exam; the erythema around the wound from last week is a lot better. There is no purulent drainage. Wound surface looks better there is some inferior eschar which I did not debride Integumentary (Hair, Skin) There is no evidence of surrounding erythema in the wound. Some evidence of chronic venous insufficiency. Wound #1 status is Open. Original cause of wound was Trauma. The wound is located on the Left,Medial Lower Leg. The wound measures 2.5cm length x 1.5cm width x 0.1cm depth; 2.945cm^2 area and 0.295cm^3 volume. There is Fat Layer (Subcutaneous Tissue) Exposed exposed. There is no tunneling or undermining noted. There is a large amount  of serosanguineous drainage noted. The wound margin is flat and intact. There is no granulation within the wound bed. There is a large (67-100%) amount of necrotic tissue within the wound bed including Eschar and Adherent Slough. The periwound skin appearance exhibited: Excoriation, Ecchymosis, Erythema. The surrounding wound skin color is noted with erythema which is circumferential. Periwound temperature was noted as No Abnormality. The periwound has  tenderness on palpation. Assessment Active Problems ICD-10 L03.116 - Cellulitis of left lower limb L97.822 - Non-pressure chronic ulcer of other part of left lower leg with fat layer exposed I48.0 - Paroxysmal atrial fibrillation Z79.01 - Long term (current) use of anticoagulants Ohlson, Alba C. ([id number]) Plan #1 I'm going to continue with Aquacel Ag #2 the patient can complete her antibiotics, the infection seems to of resolved #3 we will see her back in 2 weeks but they are to call if there are interim problems Electronic Signature(s) Signed: 06/27/2017 4:30:21 AM By: Linton Ham MD Entered By: Linton Ham on 06/26/2017 12:26:45 [name] ([id number]) -------------------------------------------------------------------------------- SuperBill Details Patient Name: [name] Date of Service: 06/26/2017 Medical Record Patient Account Number: [id number] Number: Treating RN: Cornell Barman 1926-03-18 (83 y.o. Other Clinician: Date of Birth/Sex: Female) Treating Kieron Kantner, [place] Primary Care Provider: BABAOFF, MARCUS Provider/Extender: G Referring Provider: BABAOFF, MARCUS Weeks in Treatment: 2 Diagnosis Coding ICD-10 Codes Code Description L03.116 Cellulitis of left lower limb L97.822 Non-pressure chronic ulcer of other part of left lower leg with fat layer exposed I48.0 Paroxysmal atrial fibrillation Z79.01 Long term (current) use of anticoagulants Facility Procedures CPT4 Code:  [id number] Description: 99213 - WOUND CARE VISIT-LEV 3 EST PT Modifier: Quantity: 1 Physician Procedures CPT4: Description Modifier Quantity Code [id number] - WC PHYS LEVEL 3 - EST PT 1 ICD-10 Description Diagnosis L97.822 Non-pressure chronic ulcer of other part of left lower leg with fat layer exposed L03.116 Cellulitis of left lower limb Electronic Signature(s) Signed: 06/26/2017 5:20:16 PM By: Gretta Cool, BSN, RN, [name], Kim RN, BSN Signed: 06/27/2017 4:30:21 AM By: Linton Ham MD Entered By: Gretta Cool, BSN, RN, [name], Kim on 06/26/2017 16:47:38

## 2017-06-27 NOTE — Progress Notes (Addendum)
DENELLE, CAPURRO ([id number]) Visit Report for 06/26/2017 Arrival Information Details [name], [name]. Date of Service: 06/26/2017 11:00 AM Medical Record Patient Account Number: [id number] Number: Treating RN: Cornell Barman February 19, 1926 (83 y.o. Other Clinician: Date of Birth/Sex: Female) Treating ROBSON, MICHAEL Primary Care Jesseca Marsch: BABAOFF, MARCUS Lanyah Spengler/Extender: G Referring Leeon Makar: BABAOFF, MARCUS Weeks in Treatment: 2 Visit Information History Since Last Visit Added or deleted any medications: No Patient Arrived: Ambulatory Any new allergies or adverse reactions: No Arrival Time: 11:22 Had a fall or experienced change in No Accompanied By: husband activities of daily living that may affect Transfer Assistance: None risk of falls: Patient Identification Verified: Yes Signs or symptoms of abuse/neglect since last No Secondary Verification Process Yes visito Completed: Hospitalized since last visit: No Patient Requires Transmission- No Has Dressing in Place as Prescribed: Yes Based Precautions: Pain Present Now: No Patient Has Alerts: Yes Patient Alerts: Patient on Blood Thinner Coumadin No ABI DT pain/location Electronic Signature(s) Signed: 06/26/2017 5:20:16 PM By: Gretta Cool, BSN, RN, [name], Kim RN, BSN Entered By: Gretta Cool, BSN, RN, [name], Kim on 06/26/2017 11:24:18 [name] ([id number]) -------------------------------------------------------------------------------- Clinic Level of Care Assessment Details [name]. Date of Service: 06/26/2017 11:00 AM Medical Record Patient Account Number: [id number] Number: Treating RN: Cornell Barman 01/18/1926 (83 y.o. Other Clinician: Date of Birth/Sex: Female) Treating ROBSON, Callender Primary Care Akirra Lacerda: BABAOFF, MARCUS Yosgart Pavey/Extender: G Referring Edwinna Rochette: BABAOFF, MARCUS Weeks in Treatment: 2 Clinic Level of Care Assessment Items TOOL 4 Quantity Score []  -  Use when only an EandM is performed on FOLLOW-UP visit 0 ASSESSMENTS - Nursing Assessment / Reassessment []  - Reassessment of Co-morbidities (includes updates in patient status) 0 X - Reassessment of Adherence to Treatment Plan 1 5 ASSESSMENTS - Wound and Skin Assessment / Reassessment X - Simple Wound Assessment / Reassessment - one wound 1 5 []  - Complex Wound Assessment / Reassessment - multiple wounds 0 []  - Dermatologic / Skin Assessment (not related to wound area) 0 ASSESSMENTS - Focused Assessment []  - Circumferential Edema Measurements - multi extremities 0 []  - Nutritional Assessment / Counseling / Intervention 0 []  - Lower Extremity Assessment (monofilament, tuning fork, pulses) 0 []  - Peripheral Arterial Disease Assessment (using hand held doppler) 0 ASSESSMENTS - Ostomy and/or Continence Assessment and Care []  - Incontinence Assessment and Management 0 []  - Ostomy Care Assessment and Management (repouching, etc.) 0 PROCESS - Coordination of Care X - Simple Patient / Family Education for ongoing care 1 15 []  - Complex (extensive) Patient / Family Education for ongoing care 0 X - Staff obtains Programmer, systems, Records, Test Results / Process Orders 1 10 []  - Staff telephones HHA, Nursing Homes / Clarify orders / etc 0 [name], [name]. ([id number]) []  - Routine Transfer to another Facility (non-emergent condition) 0 []  - Routine Hospital Admission (non-emergent condition) 0 []  - New Admissions / Biomedical engineer / Ordering NPWT, Apligraf, etc. 0 []  - Emergency Hospital Admission (emergent condition) 0 X - Simple Discharge Coordination 1 10 []  - Complex (extensive) Discharge Coordination 0 PROCESS - Special Needs []  - Pediatric / Minor Patient Management 0 []  - Isolation Patient Management 0 []  - Hearing / Language / Visual special needs 0 []  - Assessment of Community assistance (transportation, D/C planning, etc.) 0 []  - Additional assistance / Altered mentation 0 []  -  Support Surface(s) Assessment (bed, cushion, seat, etc.) 0 INTERVENTIONS - Wound Cleansing / Measurement X - Simple Wound Cleansing - one wound 1 5 []  -  Complex Wound Cleansing - multiple wounds 0 X - Wound Imaging (photographs - any number of wounds) 1 5 []  - Wound Tracing (instead of photographs) 0 X - Simple Wound Measurement - one wound 1 5 []  - Complex Wound Measurement - multiple wounds 0 INTERVENTIONS - Wound Dressings []  - Small Wound Dressing one or multiple wounds 0 X - Medium Wound Dressing one or multiple wounds 1 15 []  - Large Wound Dressing one or multiple wounds 0 []  - Application of Medications - topical 0 []  - Application of Medications - injection 0 [name], [name] ([id number]) INTERVENTIONS - Miscellaneous []  - External ear exam 0 []  - Specimen Collection (cultures, biopsies, blood, body fluids, etc.) 0 []  - Specimen(s) / Culture(s) sent or taken to Lab for analysis 0 []  - Patient Transfer (multiple staff / Civil Service fast streamer / Similar devices) 0 []  - Simple Staple / Suture removal (25 or less) 0 []  - Complex Staple / Suture removal (26 or more) 0 []  - Hypo / Hyperglycemic Management (close monitor of Blood Glucose) 0 []  - Ankle / Brachial Index (ABI) - do not check if billed separately 0 X - Vital Signs 1 5 Has the patient been seen at the hospital within the last three years: Yes Total Score: 80 Level Of Care: New/Established - Level 3 Electronic Signature(s) Signed: 06/26/2017 5:20:16 PM By: Gretta Cool, BSN, RN, [name], Kim RN, BSN Entered By: Gretta Cool, BSN, RN, [name], Kim on 06/26/2017 16:47:15 [name] ([id number]) -------------------------------------------------------------------------------- Encounter Discharge Information Details [name]. Date of Service: 06/26/2017 11:00 AM Medical Record Patient Account Number: [id number] Number: Treating RN: Cornell Barman [birth year] (83 y.o. Other Clinician: Date of Birth/Sex: Female) Treating  ROBSON, MICHAEL Primary Care King Pinzon: BABAOFF, MARCUS Tyasia Packard/Extender: G Referring Valton Schwartz: BABAOFF, MARCUS Weeks in Treatment: 2 Encounter Discharge Information Items Discharge Pain Level: 0 Discharge Condition: Stable Ambulatory Status: Ambulatory Discharge Destination: Home Transportation: Private Auto Accompanied By: self Schedule Follow-up Appointment: Yes Medication Reconciliation completed and provided to Patient/Care Yes Chelisa Hennen: Provided on Clinical Summary of Care: 06/26/2017 Form Type Recipient Paper Patient MS Electronic Signature(s) Signed: 06/27/2017 10:12:36 AM By: Ruthine Dose Previous Signature: 06/26/2017 5:20:16 PM Version By: Gretta Cool, BSN, RN, [name], Kim RN, BSN Entered By: Ruthine Dose on 06/27/2017 09:43:16 [name] ([id number]) -------------------------------------------------------------------------------- Lower Extremity Assessment Details [name]. Date of Service: 06/26/2017 11:00 AM Medical Record Patient Account Number: [id number] Number: Treating RN: Cornell Barman 11-21-1925 (83 y.o. Other Clinician: Date of Birth/Sex: Female) Treating ROBSON, MICHAEL Primary Care Shoni Quijas: BABAOFF, MARCUS Rozlyn Yerby/Extender: G Referring Lotus Santillo: BABAOFF, MARCUS Weeks in Treatment: 2 Vascular Assessment Pulses: Dorsalis Pedis Palpable: [Left:Yes] Posterior Tibial Extremity colors, hair growth, and conditions: Extremity Color: [Left:Hyperpigmented] Hair Growth on Extremity: [Left:No] Temperature of Extremity: [Left:Warm] Capillary Refill: [Left:< 3 seconds] Toe Nail Assessment Left: Right: Thick: No Discolored: No Deformed: No Improper Length and Hygiene: No Electronic Signature(s) Signed: 06/26/2017 5:20:16 PM By: Gretta Cool, BSN, RN, [name], Kim RN, BSN Entered By: Gretta Cool, BSN, RN, [name], Kim on 06/26/2017 11:44:36 [name], [name]  ([id number]) -------------------------------------------------------------------------------- Multi Wound Chart Details [name]. Date of Service: 06/26/2017 11:00 AM Medical Record Patient Account Number: [id number] Number: Treating RN: Cornell Barman 1926-10-19 (83 y.o. Other Clinician: Date of Birth/Sex: Female) Treating ROBSON, MICHAEL Primary Care Jhase Creppel: BABAOFF, MARCUS Fatou Dunnigan/Extender: G Referring Janus Vlcek: BABAOFF, MARCUS Weeks in Treatment: 2 Vital Signs Height(in): 63 Pulse(bpm): 90 Weight(lbs): 99 Blood Pressure 174/87 (mmHg): Body Mass Index(BMI): 18 Temperature(F): 97.7 Respiratory Rate  16 (breaths/min): Photos: [N/A:N/A] Wound Location: Left Lower Leg - Medial N/A N/A Wounding Event: Trauma N/A N/A Primary Etiology: Trauma, Other N/A N/A Comorbid History: Cataracts, Arrhythmia, N/A N/A Hypertension, Osteoarthritis, Dementia Date Acquired: 05/06/2017 N/A N/A Weeks of Treatment: 2 N/A N/A Wound Status: Open N/A N/A Measurements L x W x D 2.5x1.5x0.1 N/A N/A (cm) Area (cm) : 2.945 N/A N/A Volume (cm) : 0.295 N/A N/A % Reduction in Area: -108.30% N/A N/A % Reduction in Volume: -109.20% N/A N/A Classification: Full Thickness Without N/A N/A Exposed Support Structures Exudate Amount: Large N/A N/A Exudate Type: Serosanguineous N/A N/A Exudate Color: red, brown N/A N/A Wound Margin: Flat and Intact N/A N/A Estrada, Keionna C. ([id number]) Granulation Amount: None Present (0%) N/A N/A Necrotic Amount: Large (67-100%) N/A N/A Necrotic Tissue: Eschar, Adherent Slough N/A N/A Exposed Structures: Fat Layer (Subcutaneous N/A N/A Tissue) Exposed: Yes Fascia: No Tendon: No Muscle: No Joint: No Bone: No Epithelialization: None N/A N/A Periwound Skin Texture: Excoriation: Yes N/A N/A Periwound Skin No Abnormalities Noted N/A N/A Moisture: Periwound Skin Color: Ecchymosis: Yes N/A N/A Erythema: Yes Erythema Location:  Circumferential N/A N/A Temperature: No Abnormality N/A N/A Tenderness on Yes N/A N/A Palpation: Wound Preparation: Ulcer Cleansing: N/A N/A Rinsed/Irrigated with Saline Topical Anesthetic Applied: Other: lidocaine 4% Treatment Notes Electronic Signature(s) Signed: 06/26/2017 5:20:16 PM By: Gretta Cool, BSN, RN, [name], Kim RN, BSN Entered By: Gretta Cool, BSN, RN, [name], Kim on 06/26/2017 16:45:05 [name] ([id number]) -------------------------------------------------------------------------------- Multi-Disciplinary Care Plan Details [name], [name]. Date of Service: 06/26/2017 11:00 AM Medical Record Patient Account Number: [id number] Number: Treating RN: Cornell Barman 07/26/1926 (83 y.o. Other Clinician: Date of Birth/Sex: Female) Treating ROBSON, MICHAEL Primary Care Marionna Gonia: BABAOFF, MARCUS Bemnet Trovato/Extender: G Referring Yasmeen Manka: BABAOFF, MARCUS Weeks in Treatment: 2 Active Inactive ` Orientation to the Wound Care Program Nursing Diagnoses: Knowledge deficit related to the wound healing center program Goals: Patient/caregiver will verbalize understanding of the [facility] Program Date Initiated: 06/26/2017 Target Resolution Date: 07/16/2017 Goal Status: Active Interventions: Provide education on orientation to the wound center Notes: ` Wound/Skin Impairment Nursing Diagnoses: Impaired tissue integrity Goals: Ulcer/skin breakdown will heal within 14 weeks Date Initiated: 06/26/2017 Target Resolution Date: 09/17/2017 Goal Status: Active Interventions: Assess ulceration(s) every visit Treatment Activities: Skin care regimen initiated : 06/26/2017 Notes: [name], [name] ([id number]) Electronic Signature(s) Signed: 06/26/2017 5:20:16 PM By: Gretta Cool, BSN, RN, [name], Kim RN, BSN Entered By: Gretta Cool, BSN, RN, [name], Kim on 06/26/2017 16:44:58 [name], [name]  ([id number]) -------------------------------------------------------------------------------- Pain Assessment Details [name]. Date of Service: 06/26/2017 11:00 AM Medical Record Patient Account Number: [id number] Number: Treating RN: Cornell Barman 1926/02/16 (83 y.o. Other Clinician: Date of Birth/Sex: Female) Treating ROBSON, MICHAEL Primary Care Odaliz Mcqueary: BABAOFF, MARCUS Leevi Cullars/Extender: G Referring Irmgard Rampersaud: BABAOFF, MARCUS Weeks in Treatment: 2 Active Problems Location of Pain Severity and Description of Pain Patient Has Paino No Site Locations With Dressing Change: No Pain Management and Medication Current Pain Management: Goals for Pain Management Topical or injectable lidocaine is offered to patient for acute pain when surgical debridement is performed. If needed, Patient is instructed to use over the counter pain medication for the following 24-48 hours after debridement. Wound care MDs do not prescribed pain medications. Patient has chronic pain or uncontrolled pain. Patient has been instructed to make an appointment with their Primary Care Physician for pain management. Electronic Signature(s) Signed: 06/26/2017 5:20:16 PM By: Gretta Cool, BSN, RN, [name], Kim RN, BSN Entered By: Gretta Cool, BSN, RN,  [name], Kim on 06/26/2017 11:27:07 [name], [name] ([id number]) -------------------------------------------------------------------------------- Patient/Caregiver Education Details [name], [name]. Date of Service: 06/26/2017 11:00 AM Medical Record Patient Account Number: [id number] Number: Treating RN: Cornell Barman 08/12/1926 (83 y.o. Other Clinician: Date of Birth/Gender: Female) Treating ROBSON, MICHAEL Primary Care Physician: BABAOFF, MARCUS Physician/Extender: G Referring Physician: BABAOFF, MARCUS Weeks in Treatment: 2 Education Assessment Education Provided To: Patient Education Topics Provided Wound/Skin  Impairment: Handouts: Caring for Your Ulcer Methods: Demonstration Responses: State content correctly Electronic Signature(s) Signed: 06/26/2017 5:20:16 PM By: Gretta Cool, BSN, RN, [name], Kim RN, BSN Entered By: Gretta Cool, BSN, RN, [name], Kim on 06/26/2017 16:49:19 [name] ([id number]) -------------------------------------------------------------------------------- Wound Assessment Details [name], [name]. Date of Service: 06/26/2017 11:00 AM Medical Record Patient Account Number: [id number] Number: Treating RN: Cornell Barman 1926/07/17 (83 y.o. Other Clinician: Date of Birth/Sex: Female) Treating ROBSON, MICHAEL Primary Care Olivine Hiers: BABAOFF, MARCUS Jeanluc Wegman/Extender: G Referring Takeela Peil: BABAOFF, MARCUS Weeks in Treatment: 2 Wound Status Wound Number: 1 Primary Trauma, Other Etiology: Wound Location: Left Lower Leg - Medial Wound Open Wounding Event: Trauma Status: Date Acquired: 05/06/2017 Comorbid Cataracts, Arrhythmia, Hypertension, Weeks Of Treatment: 2 History: Osteoarthritis, Dementia Clustered Wound: No Photos Wound Measurements Length: (cm) 2.5 Width: (cm) 1.5 Depth: (cm) 0.1 Area: (cm) 2.945 Volume: (cm) 0.295 % Reduction in Area: -108.3% % Reduction in Volume: -109.2% Epithelialization: None Tunneling: No Undermining: No Wound Description Full Thickness Without Exposed Foul Odor After C Classification: Support Structures Slough/Fibrino Wound Margin: Flat and Intact Exudate Large Amount: Exudate Type: Serosanguineous Exudate Color: red, brown leansing: No No Wound Bed Granulation Amount: None Present (0%) Exposed Structure Necrotic Amount: Large (67-100%) Fascia Exposed: No Necrotic Quality: Eschar, Adherent Slough Fat Layer (Subcutaneous Tissue) Exposed: Yes Tendon Exposed: No Jaggi, Kendyll C. ([id number]) Muscle Exposed: No Joint Exposed: No Bone Exposed: No Periwound Skin Texture Texture Color No Abnormalities  Noted: No No Abnormalities Noted: No Excoriation: Yes Ecchymosis: Yes Erythema: Yes Moisture Erythema Location: Circumferential No Abnormalities Noted: No Temperature / Pain Temperature: No Abnormality Tenderness on Palpation: Yes Wound Preparation Ulcer Cleansing: Rinsed/Irrigated with Saline Topical Anesthetic Applied: Other: lidocaine 4%, Treatment Notes Wound #1 (Left, Medial Lower Leg) 1. Cleansed with: Clean wound with Normal Saline 2. Anesthetic Topical Lidocaine 4% cream to wound bed prior to debridement 4. Dressing Applied: Aquacel Ag Mepitel 5. Secondary Dressing Applied ABD and Kerlix/Conform 7. Secured with Recruitment consultant) Signed: 06/26/2017 5:20:16 PM By: Gretta Cool, BSN, RN, [name], Kim RN, BSN Entered By: Gretta Cool, BSN, RN, [name], Kim on 06/26/2017 11:30:36 [name] ([id number]) -------------------------------------------------------------------------------- Vitals Details [name] Date of Service: 06/26/2017 11:00 AM Medical Record Patient Account Number: [id number] Number: Treating RN: Cornell Barman Nov 13, 1925 (83 y.o. Other Clinician: Date of Birth/Sex: Female) Treating ROBSON, MICHAEL Primary Care Elienai Gailey: BABAOFF, MARCUS Behr Cislo/Extender: G Referring Domani Bakos: BABAOFF, MARCUS Weeks in Treatment: 2 Vital Signs Time Taken: 11:27 Temperature (F): 97.7 Height (in): 63 Pulse (bpm): 90 Weight (lbs): 99 Respiratory Rate (breaths/min): 16 Body Mass Index (BMI): 17.5 Blood Pressure (mmHg): 174/87 Reference Range: 80 - 120 mg / dl Electronic Signature(s) Signed: 06/26/2017 5:20:16 PM By: Gretta Cool, BSN, RN, [name], Kim RN, BSN Entered By: Gretta Cool, BSN, RN, [name], Kim on 06/26/2017 11:27:56

## 2017-07-10 ENCOUNTER — Encounter: Payer: Medicare Other | Admitting: Internal Medicine

## 2017-07-10 DIAGNOSIS — L97822 Non-pressure chronic ulcer of other part of left lower leg with fat layer exposed: Secondary | ICD-10-CM | POA: Diagnosis not present

## 2017-07-11 NOTE — Progress Notes (Signed)
[name] ([id number]) Visit Report for 07/10/2017 Arrival Information Details [name], [name]. Date of Service: 07/10/2017 11:00 AM Medical Record Patient Account Number: [id number] Number: Treating RN: Montey Hora 09/27/1926 (83 y.o. Other Clinician: Date of Birth/Sex: Female) Treating ROBSON, [name] Primary Care [name]: [name] [name]/Extender: G Referring [name]: [name] Weeks in Treatment: 4 Visit Information History Since Last Visit Added or deleted any medications: No Patient Arrived: Ambulatory Any new allergies or adverse reactions: No Arrival Time: 11:12 Had a fall or experienced change in No Accompanied By: spouse activities of daily living that may affect Transfer Assistance: None risk of falls: Patient Identification Verified: Yes Signs or symptoms of abuse/neglect since last No Secondary Verification Process Yes visito Completed: Hospitalized since last visit: No Patient Requires Transmission- No Has Dressing in Place as Prescribed: Yes Based Precautions: Pain Present Now: No Patient Has Alerts: Yes Patient Alerts: Patient on Blood Thinner Coumadin No ABI DT pain/location Electronic Signature(s) Signed: 07/10/2017 4:51:53 PM By: Montey Hora Entered By: Montey Hora on 07/10/2017 11:12:27 [name] ([id number]) -------------------------------------------------------------------------------- Clinic Level of Care Assessment Details [name]. Date of Service: 07/10/2017 11:00 AM Medical Record Patient Account Number: [id number] Number: Treating RN: Montey Hora 01/18/1926 (83 y.o. Other Clinician: Date of Birth/Sex: Female) Treating ROBSON, [name] Primary Care [name]: [name] [name]/Extender: G Referring [name]: [name] Weeks in Treatment: 4 Clinic Level of Care Assessment Items TOOL 4 Quantity Score []  - Use when only an  EandM is performed on FOLLOW-UP visit 0 ASSESSMENTS - Nursing Assessment / Reassessment X - Reassessment of Co-morbidities (includes updates in patient status) 1 10 X - Reassessment of Adherence to Treatment Plan 1 5 ASSESSMENTS - Wound and Skin Assessment / Reassessment X - Simple Wound Assessment / Reassessment - one wound 1 5 []  - Complex Wound Assessment / Reassessment - multiple wounds 0 []  - Dermatologic / Skin Assessment (not related to wound area) 0 ASSESSMENTS - Focused Assessment []  - Circumferential Edema Measurements - multi extremities 0 []  - Nutritional Assessment / Counseling / Intervention 0 X - Lower Extremity Assessment (monofilament, tuning fork, pulses) 1 5 []  - Peripheral Arterial Disease Assessment (using hand held doppler) 0 ASSESSMENTS - Ostomy and/or Continence Assessment and Care []  - Incontinence Assessment and Management 0 []  - Ostomy Care Assessment and Management (repouching, etc.) 0 PROCESS - Coordination of Care X - Simple Patient / Family Education for ongoing care 1 15 []  - Complex (extensive) Patient / Family Education for ongoing care 0 []  - Staff obtains Programmer, systems, Records, Test Results / Process Orders 0 []  - Staff telephones HHA, Nursing Homes / Clarify orders / etc 0 Macapagal, Lorena C. ([id number]) []  - Routine Transfer to another Facility (non-emergent condition) 0 []  - Routine Hospital Admission (non-emergent condition) 0 []  - New Admissions / Biomedical engineer / Ordering NPWT, Apligraf, etc. 0 []  - Emergency Hospital Admission (emergent condition) 0 X - Simple Discharge Coordination 1 10 []  - Complex (extensive) Discharge Coordination 0 PROCESS - Special Needs []  - Pediatric / Minor Patient Management 0 []  - Isolation Patient Management 0 []  - Hearing / Language / Visual special needs 0 []  - Assessment of Community assistance (transportation, D/C planning, etc.) 0 []  - Additional assistance / Altered mentation 0 []  - Support Surface(s)  Assessment (bed, cushion, seat, etc.) 0 INTERVENTIONS - Wound Cleansing / Measurement X - Simple Wound Cleansing - one wound 1 5 []  - Complex Wound Cleansing - multiple wounds 0  X - Wound Imaging (photographs - any number of wounds) 1 5 []  - Wound Tracing (instead of photographs) 0 X - Simple Wound Measurement - one wound 1 5 []  - Complex Wound Measurement - multiple wounds 0 INTERVENTIONS - Wound Dressings []  - Small Wound Dressing one or multiple wounds 0 X - Medium Wound Dressing one or multiple wounds 1 15 []  - Large Wound Dressing one or multiple wounds 0 []  - Application of Medications - topical 0 []  - Application of Medications - injection 0 Hinderliter, [name] ([id number]) INTERVENTIONS - Miscellaneous []  - External ear exam 0 []  - Specimen Collection (cultures, biopsies, blood, body fluids, etc.) 0 []  - Specimen(s) / Culture(s) sent or taken to Lab for analysis 0 []  - Patient Transfer (multiple staff / [name] / Similar devices) 0 []  - Simple Staple / Suture removal (25 or less) 0 []  - Complex Staple / Suture removal (26 or more) 0 []  - Hypo / Hyperglycemic Management (close monitor of Blood Glucose) 0 []  - Ankle / Brachial Index (ABI) - do not check if billed separately 0 X - Vital Signs 1 5 Has the patient been seen at the hospital within the last three years: Yes Total Score: 85 Level Of Care: New/Established - Level 3 Electronic Signature(s) Signed: 07/10/2017 4:51:53 PM By: Montey Hora Entered By: Montey Hora on 07/10/2017 11:54:44 [name] ([id number]) -------------------------------------------------------------------------------- Encounter Discharge Information Details [name]. Date of Service: 07/10/2017 11:00 AM Medical Record Patient Account Number: [id number] Number: Treating RN: Montey Hora Mar 13, 1926 (83 y.o. Other Clinician: Date of Birth/Sex: Female) Treating ROBSON, [name] Primary Care Zachory Mangual:  [name] Brewster Wolters/Extender: G Referring Oksana Deberry: [name] Weeks in Treatment: 4 Encounter Discharge Information Items Discharge Pain Level: 0 Discharge Condition: Stable Ambulatory Status: Ambulatory Discharge Destination: Home Transportation: Private Auto Accompanied By: spouse Schedule Follow-up Appointment: Yes Medication Reconciliation completed and provided to Patient/Care No Bralyn Folkert: Provided on Clinical Summary of Care: 07/10/2017 Form Type Recipient Paper Patient [place] Electronic Signature(s) Signed: 07/10/2017 4:18:12 PM By: Ruthine Dose Entered By: Ruthine Dose on 07/10/2017 11:47:53 Tejera, [name] ([id number]) -------------------------------------------------------------------------------- Lower Extremity Assessment Details [name]. Date of Service: 07/10/2017 11:00 AM Medical Record Patient Account Number: [id number] Number: Treating RN: Montey Hora 10-20-1926 (83 y.o. Other Clinician: Date of Birth/Sex: Female) Treating ROBSON, [name] Primary Care Anjali Manzella: [name] Shevette Bess/Extender: G Referring Charissa Knowles: [name] Weeks in Treatment: 4 Vascular Assessment Pulses: Dorsalis Pedis Palpable: [Left:Yes] Posterior Tibial Extremity colors, hair growth, and conditions: Extremity Color: [Left:Hyperpigmented] Hair Growth on Extremity: [Left:No] Temperature of Extremity: [Left:Warm] Capillary Refill: [Left:< 3 seconds] Toe Nail Assessment Left: Right: Thick: No Discolored: No Deformed: No Improper Length and Hygiene: No Electronic Signature(s) Signed: 07/10/2017 4:51:53 PM By: Montey Hora Entered By: Montey Hora on 07/10/2017 11:21:11 Lenhoff, [name] ([id number]) -------------------------------------------------------------------------------- Multi Wound Chart Details [name]. Date of Service: 07/10/2017 11:00 AM Medical Record Patient Account Number:  [id number] Number: Treating RN: Montey Hora Oct 29, 1925 (83 y.o. Other Clinician: Date of Birth/Sex: Female) Treating ROBSON, [name] Primary Care Seith Aikey: [name] Barre Aydelott/Extender: G Referring Muhsin Doris: [name] Weeks in Treatment: 4 Vital Signs Height(in): 63 Pulse(bpm): 92 Weight(lbs): 99 Blood Pressure 175/95 (mmHg): Body Mass Index(BMI): 18 Temperature(F): 97.9 Respiratory Rate 16 (breaths/min): Photos: [1:No Photos] [N/A:N/A] Wound Location: [1:Left Lower Leg - Medial] [N/A:N/A] Wounding Event: [1:Trauma] [N/A:N/A] Primary Etiology: [1:Trauma, Other] [N/A:N/A] Comorbid History: [1:Cataracts, Arrhythmia, Hypertension, Osteoarthritis, Dementia] [N/A:N/A] Date Acquired: [1:05/06/2017] [N/A:N/A] Weeks of  Treatment: [1:4] [N/A:N/A] Wound Status: [1:Open] [N/A:N/A] Measurements L x W x D 2.4x1.1x0.1 [N/A:N/A] (cm) Area (cm) : [1:2.073] [N/A:N/A] Volume (cm) : [1:0.207] [N/A:N/A] % Reduction in Area: [1:-46.60%] [N/A:N/A] % Reduction in Volume: -46.80% [N/A:N/A] Classification: [1:Full Thickness Without Exposed Support Structures] [N/A:N/A] Exudate Amount: [1:Large] [N/A:N/A] Exudate Type: [1:Serosanguineous] [N/A:N/A] Exudate Color: [1:red, brown] [N/A:N/A] Wound Margin: [1:Flat and Intact] [N/A:N/A] Granulation Amount: [1:Large (67-100%)] [N/A:N/A] Granulation Quality: [1:Red] [N/A:N/A] Necrotic Amount: [1:Small (1-33%)] [N/A:N/A] Exposed Structures: [1:Fat Layer (Subcutaneous Tissue) Exposed: Yes] [N/A:N/A] Fascia: No Tendon: No Muscle: No Joint: No Bone: No Epithelialization: None N/A N/A Periwound Skin Texture: Excoriation: Yes N/A N/A Periwound Skin No Abnormalities Noted N/A N/A Moisture: Periwound Skin Color: Ecchymosis: Yes N/A N/A Erythema: Yes Erythema Location: Circumferential N/A N/A Temperature: No Abnormality N/A N/A Tenderness on Yes N/A N/A Palpation: Wound Preparation: Ulcer Cleansing: N/A  N/A Rinsed/Irrigated with Saline Topical Anesthetic Applied: Other: lidocaine 4% Treatment Notes Wound #1 (Left, Medial Lower Leg) 1. Cleansed with: Clean wound with Normal Saline 2. Anesthetic Topical Lidocaine 4% cream to wound bed prior to debridement 4. Dressing Applied: Aquacel Ag Contact layer 5. Secondary Dressing Applied Bordered Foam Dressing Dry Gauze Electronic Signature(s) Signed: 07/10/2017 4:13:20 PM By: Linton Ham MD Entered By: Linton Ham on 07/10/2017 12:11:47 [name] ([id number]) -------------------------------------------------------------------------------- Multi-Disciplinary Care Plan Details Patient Name: BRAIDYN, PEACE. Date of Service: 07/10/2017 11:00 AM Medical Record Patient Account Number: [id number] Number: Treating RN: Montey Hora 1926-07-20 (83 y.o. Other Clinician: Date of Birth/Sex: Female) Treating ROBSON, [name] Primary Care Santa Abdelrahman: [name] Danelle Curiale/Extender: G Referring Harmoney Sienkiewicz: [name] Weeks in Treatment: 4 Active Inactive ` Orientation to the Wound Care Program Nursing Diagnoses: Knowledge deficit related to the wound healing center program Goals: Patient/caregiver will verbalize understanding of the [facility] Program Date Initiated: 06/26/2017 Target Resolution Date: 07/16/2017 Goal Status: Active Interventions: Provide education on orientation to the wound center Notes: ` Wound/Skin Impairment Nursing Diagnoses: Impaired tissue integrity Goals: Ulcer/skin breakdown will heal within 14 weeks Date Initiated: 06/26/2017 Target Resolution Date: 09/17/2017 Goal Status: Active Interventions: Assess ulceration(s) every visit Treatment Activities: Skin care regimen initiated : 06/26/2017 Notes: LAKESIA, DAHLE ([id number]) Electronic Signature(s) Signed: 07/10/2017 4:51:53 PM By: Montey Hora Entered By: Montey Hora on 07/10/2017 11:21:33 [name]  ([id number]) -------------------------------------------------------------------------------- Pain Assessment Details [name]. Date of Service: 07/10/2017 11:00 AM Medical Record Patient Account Number: [id number] Number: Treating RN: Montey Hora 23-Jan-1926 (83 y.o. Other Clinician: Date of Birth/Sex: Female) Treating ROBSON, [name] Primary Care Hillarie Harrigan: [name] Kippy Gohman/Extender: G Referring Klaudia Beirne: [name] Weeks in Treatment: 4 Active Problems Location of Pain Severity and Description of Pain Patient Has Paino Yes Site Locations Pain Location: Pain in Ulcers With Dressing Change: Yes Duration of the Pain. Constant / Intermittento Constant Pain Management and Medication Current Pain Management: Notes Topical or injectable lidocaine is offered to patient for acute pain when surgical debridement is performed. If needed, Patient is instructed to use over the counter pain medication for the following 24-48 hours after debridement. Wound care MDs do not prescribed pain medications. Patient has chronic pain or uncontrolled pain. Patient has been instructed to make an appointment with their Primary Care Physician for pain management. Electronic Signature(s) Signed: 07/10/2017 4:51:53 PM By: Montey Hora Entered By: Montey Hora on 07/10/2017 11:12:47 [name] ([id number]) -------------------------------------------------------------------------------- Patient/Caregiver Education Details [name] Date of Service: 07/10/2017 11:00 AM Medical Record Patient Account Number: [id number] Number: Treating RN:  Montey Hora 12-01-1925 (83 y.o. Other Clinician: Date of Birth/Gender: Female) Treating ROBSON, [name] Primary Care Physician: [name] Physician/Extender: G Referring Physician: BABAOFF, [name] Weeks in Treatment: 4 Education Assessment Education Provided  To: Patient and Caregiver Education Topics Provided Wound/Skin Impairment: Handouts: Other: wound care as ordered Methods: Demonstration, Explain/Verbal Responses: State content correctly Electronic Signature(s) Signed: 07/10/2017 4:51:53 PM By: Montey Hora Entered By: Montey Hora on 07/10/2017 11:34:00 Whidden, [name] ([id number]) -------------------------------------------------------------------------------- Wound Assessment Details Patient Name: Thomes Lolling C. Date of Service: 07/10/2017 11:00 AM Medical Record Patient Account Number: [id number] Number: Treating RN: Montey Hora 1926-07-20 (83 y.o. Other Clinician: Date of Birth/Sex: Female) Treating ROBSON, [name] Primary Care Shanitra Phillippi: [name] Haidan Nhan/Extender: G Referring Mischa Pollard: [name] Weeks in Treatment: 4 Wound Status Wound Number: 1 Primary Trauma, Other Etiology: Wound Location: Left Lower Leg - Medial Wound Open Wounding Event: Trauma Status: Date Acquired: 05/06/2017 Comorbid Cataracts, Arrhythmia, Hypertension, Weeks Of Treatment: 4 History: Osteoarthritis, Dementia Clustered Wound: No Photos Photo Uploaded By: Montey Hora on 07/10/2017 13:24:29 Wound Measurements Length: (cm) 2.4 Width: (cm) 1.1 Depth: (cm) 0.1 Area: (cm) 2.073 Volume: (cm) 0.207 % Reduction in Area: -46.6% % Reduction in Volume: -46.8% Epithelialization: None Tunneling: No Undermining: No Wound Description Full Thickness Without Exposed Classification: Support Structures Wound Margin: Flat and Intact Exudate Large Amount: Exudate Type: Serosanguineous Exudate Color: red, brown Foul Odor After Cleansing: No Slough/Fibrino No Wound Bed Granulation Amount: Large (67-100%) Exposed Structure Yellen, Tricia C. ([id number]) Granulation Quality: Red Fascia Exposed: No Necrotic Amount: Small (1-33%) Fat Layer (Subcutaneous Tissue) Exposed: Yes Necrotic Quality: Adherent  Slough Tendon Exposed: No Muscle Exposed: No Joint Exposed: No Bone Exposed: No Periwound Skin Texture Texture Color No Abnormalities Noted: No No Abnormalities Noted: No Excoriation: Yes Ecchymosis: Yes Erythema: Yes Moisture Erythema Location: Circumferential No Abnormalities Noted: No Temperature / Pain Temperature: No Abnormality Tenderness on Palpation: Yes Wound Preparation Ulcer Cleansing: Rinsed/Irrigated with Saline Topical Anesthetic Applied: Other: lidocaine 4%, Treatment Notes Wound #1 (Left, Medial Lower Leg) 1. Cleansed with: Clean wound with Normal Saline 2. Anesthetic Topical Lidocaine 4% cream to wound bed prior to debridement 4. Dressing Applied: Aquacel Ag Contact layer 5. Secondary Dressing Applied Bordered Foam Dressing Dry Gauze Electronic Signature(s) Signed: 07/10/2017 4:51:53 PM By: Montey Hora Entered By: Montey Hora on 07/10/2017 11:20:44 [name] ([id number]) -------------------------------------------------------------------------------- Vitals Details [name]. Date of Service: 07/10/2017 11:00 AM Medical Record Patient Account Number: [id number] Number: Treating RN: Montey Hora [birth year] (83 y.o. Other Clinician: Date of Birth/Sex: Female) Treating ROBSON, [name] Primary Care Kaynen Minner: [name] Isaih Bulger/Extender: G Referring Zaven Klemens: [name] Weeks in Treatment: 4 Vital Signs Time Taken: 11:12 Temperature (F): 97.9 Height (in): 63 Pulse (bpm): 92 Weight (lbs): 99 Respiratory Rate (breaths/min): 16 Body Mass Index (BMI): 17.5 Blood Pressure (mmHg): 175/95 Reference Range: 80 - 120 mg / dl Electronic Signature(s) Signed: 07/10/2017 4:51:53 PM By: Montey Hora Entered By: Montey Hora on 07/10/2017 11:14:05

## 2017-07-11 NOTE — Progress Notes (Signed)
BALJIT, LIEBERT ([id number]) Visit Report for 07/10/2017 HPI Details [name], [name]. Date of Service: 07/10/2017 11:00 AM Medical Record Patient Account Number: [id number] Number: Treating RN: Montey Hora 05-Jul-1926 (83 y.o. Other Clinician: Date of Birth/Sex: Female) Treating Balin Vandegrift Primary Care Provider: BABAOFF, MARCUS Provider/Extender: G Referring Provider: BABAOFF, MARCUS Weeks in Treatment: 4 History of Present Illness HPI Description: 06/06/17 on evaluation today patient presents for evaluation concerning an ulcer which she initially had arise as a result of striking her left lower extremity money bed frame. With that being said this was roughly one month ago and unfortunately the wound despite two courses of Keflex have not really made a dramatic improvement. This has continued to drain as well as being exquisitely tender at times. Even to the point that she is having a difficult time walking. Patient does have chronic atrial fibrillation which subsequently leads to her being on long-term anticoagulant therapy and this causes ED bruising which may have contributed to this entry and where things are at this point. She does have valvular heart disease as well as hypertension and her last INR was 2.7. Currently Neosporin, Gauls, and an ace wrap has been utilized. Keflex is the antibiotic that she has been on. Patient has not had a culture up to this point and she states that her blood pressure at the primary care office yesterday was 138/72. Her pain is significant related to be an eight out of 10. No fevers, chills, nausea, or vomiting noted at this time. 06/20/17; patient was admitted here 2 weeks ago. She had a traumatic wound on her left lateral lower leg probably in the setting of some degree of venous insufficiency with surrounding cellulitis. She had been on Keflex, after we saw her she was put on doxycycline which she apparently did not  tolerate. Culture that was done at the time showed Morganella which was resistant to Keflex. Apparently the patient continued to take Keflex after the appointment. She is also on Coumadin. I had tried to change her antibiotic when the Morganella culture was brought to my attention however apparently our staff could not reach the patient to inform them of the antibiotic change 06/26/17; patient has wound on her left lateral lower extremity in the setting of some degree of venous insufficiency. I gave her cefdinir last week and she is completing this. There is no evidence of surrounding infection. Aggressive debridement last week, wound bed looks healthier. We've been using Aquacel. They're traveling in the [place] next week we'll see her back in 2 weeks unless there are problems. 07/10/17; the patient is been using Aquacel Ag her husband is changing this with Kerlix and conformer. She still complains of a lot of pain. Apparently with the antibiotics. We gave 2 or 3 weeks ago. Her INR went up to 6, this was managed by her primary physician Electronic Signature(s) Signed: 07/10/2017 4:13:20 PM By: Linton Ham MD Entered By: Linton Ham on 07/10/2017 12:12:35 [name] ([id number]) -------------------------------------------------------------------------------- Physical Exam Details [name], [name] Date of Service: 07/10/2017 11:00 AM Medical Record Patient Account Number: [id number] Number: Treating RN: Montey Hora February 01, 1926 (83 y.o. Other Clinician: Date of Birth/Sex: Female) Treating Samentha Perham Primary Care Provider: BABAOFF, MARCUS Provider/Extender: G Referring Provider: BABAOFF, MARCUS Weeks in Treatment: 4 Constitutional Patient is hypertensive.. Pulse regular and within target range for patient.Marland Kitchen Respirations regular, non-labored and within target range.. Temperature is normal and within the target range for the patient.Marland Kitchen appears in  no distress. Cardiovascular Pedal pulses palpable and strong bilaterally.. Notes Wound exam the erythema around the wound appears to of resolved. No evidence of current infection The wound itself looks healthy Electronic Signature(s) Signed: 07/10/2017 4:13:20 PM By: Linton Ham MD Entered By: Linton Ham on 07/10/2017 12:14:34 [name] ([id number]) -------------------------------------------------------------------------------- Physician Orders Details [name]. Date of Service: 07/10/2017 11:00 AM Medical Record Patient Account Number: [id number] Number: Treating RN: Montey Hora December 18, 1925 (83 y.o. Other Clinician: Date of Birth/Sex: Female) Treating Henryetta Corriveau Primary Care Provider: BABAOFF, MARCUS Provider/Extender: G Referring Provider: BABAOFF, MARCUS Weeks in Treatment: 4 Verbal / Phone Orders: No Diagnosis Coding Wound Cleansing Wound #1 Left,Medial Lower Leg o Cleanse wound with mild soap and water o May Shower, gently pat wound dry prior to applying new dressing. Anesthetic Wound #1 Left,Medial Lower Leg o Topical Lidocaine 4% cream applied to wound bed prior to debridement Primary Wound Dressing Wound #1 Left,Medial Lower Leg o Aquacel Ag o Other: - adaptic Secondary Dressing Wound #1 Left,Medial Lower Leg o Boardered Foam Dressing Dressing Change Frequency Wound #1 Left,Medial Lower Leg o Change dressing every other day. Follow-up Appointments Wound #1 Left,Medial Lower Leg o Return Appointment in 1 week. Edema Control Wound #1 Left,Medial Lower Leg o Elevate legs to the level of the heart and pump ankles as often as possible Additional Orders / Instructions Wound #1 Left,Medial Lower Leg o Increase protein intake. [name], [name] ([id number]) o Other: - Check Coumadin levels on early next week. Electronic Signature(s) Signed: 07/10/2017 4:13:20 PM By: Linton Ham  MD Signed: 07/10/2017 4:51:53 PM By: Montey Hora Entered By: Montey Hora on 07/10/2017 11:37:55 [name], [name] ([id number]) -------------------------------------------------------------------------------- Problem List Details [name], [name]. Date of Service: 07/10/2017 11:00 AM Medical Record Patient Account Number: [id number] Number: Treating RN: Montey Hora 11-Dec-1925 (83 y.o. 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 Date of Service: 07/10/2017 11:00 AM Medical Record Patient Account Number: [id number] Number: Treating RN: Montey Hora 01-30-1926 (83 y.o. Other Clinician: Date of Birth/Sex: Female) Treating Desirre Eickhoff Primary Care Provider: BABAOFF, MARCUS Provider/Extender: G Referring Provider: BABAOFF, MARCUS Weeks in Treatment: 4 Subjective History of Present Illness (HPI) 06/06/17 on evaluation today patient presents for evaluation concerning an ulcer which she initially had arise as a result of striking her left lower extremity money bed frame. With that being said this was roughly one month ago and  unfortunately the wound despite two courses of Keflex have not really made a dramatic improvement. This has continued to drain as well as being exquisitely tender at times. Even to the point that she is having a difficult time walking. Patient does have chronic atrial fibrillation which subsequently leads to her being on long-term anticoagulant therapy and this causes ED bruising which may have contributed to this entry and where things are at this point. She does have valvular heart disease as  well as hypertension and her last INR was 2.7. Currently Neosporin, Gauls, and an ace wrap has been utilized. Keflex is the antibiotic that she has been on. Patient has not had a culture up to this point and she states that her blood pressure at the primary care office yesterday was 138/72. Her pain is significant related to be an eight out of 10. No fevers, chills, nausea, or vomiting noted at this time. 06/20/17; patient was admitted here 2 weeks ago. She had a traumatic wound on her left lateral lower leg probably in the setting of some degree of venous insufficiency with surrounding cellulitis. She had been on Keflex, after we saw her she was put on doxycycline which she apparently did not tolerate. Culture that was done at the time showed Morganella which was resistant to Keflex. Apparently the patient continued to take Keflex after the appointment. She is also on Coumadin. I had tried to change her antibiotic when the Morganella culture was brought to my attention however apparently our staff could not reach the patient to inform them of the antibiotic change 06/26/17; patient has wound on her left lateral lower extremity in the setting of some degree of venous insufficiency. I gave her cefdinir last week and she is completing this. There is no evidence of surrounding infection. Aggressive debridement last week, wound bed looks healthier. We've been using Aquacel. They're traveling in the [place] next  week we'll see her back in 2 weeks unless there are problems. 07/10/17; the patient is been using Aquacel Ag her husband is changing this with Kerlix and conformer. She still complains of a lot of pain. Apparently with the antibiotics. We gave 2 or 3 weeks ago. Her INR went up to 6, this was managed by her primary physician Objective [name], [name] ([id number]) Constitutional Patient is hypertensive.. Pulse regular and within target range for patient.Marland Kitchen Respirations regular, non-labored and within target range.. Temperature is normal and within the target range for the patient.Marland Kitchen appears in no distress. Vitals Time Taken: 11:12 AM, Height: 63 in, Weight: 99 lbs, BMI: 17.5, Temperature: 97.9 F, Pulse: 92 bpm, Respiratory Rate: 16 breaths/min, Blood Pressure: 175/95 mmHg. Cardiovascular Pedal pulses palpable and strong bilaterally.. General Notes: Wound exam the erythema around the wound appears to of resolved. No evidence of current infection The wound itself looks healthy Integumentary (Hair, Skin) Wound #1 status is Open. Original cause of wound was Trauma. The wound is located on the Left,Medial Lower Leg. The wound measures 2.4cm length x 1.1cm width x 0.1cm depth; 2.073cm^2 area and 0.207cm^3 volume. There is Fat Layer (Subcutaneous Tissue) Exposed exposed. There is no tunneling or undermining noted. There is a large amount of serosanguineous drainage noted. The wound margin is flat and intact. There is large (67-100%) red granulation within the wound bed. There is a small (1-33%) amount of necrotic tissue within the wound bed including Adherent Slough. The periwound skin appearance exhibited: Excoriation, Ecchymosis, Erythema. The surrounding wound skin color is noted with erythema which is circumferential. Periwound temperature was noted as No Abnormality. The periwound has tenderness on palpation. Assessment Active Problems ICD-10 L03.116 - Cellulitis of left lower limb L97.822  - Non-pressure chronic ulcer of other part of left lower leg with fat layer exposed I48.0 - Paroxysmal atrial fibrillation Z79.01 - Long term (current) use of anticoagulants Plan Wound Cleansing: Wound #1 Left,Medial Lower Leg: [name], [name] ([id number]) Cleanse wound with mild soap and water May Shower, gently pat wound dry prior to applying new  dressing. Anesthetic: Wound #1 Left,Medial Lower Leg: Topical Lidocaine 4% cream applied to wound bed prior to debridement Primary Wound Dressing: Wound #1 Left,Medial Lower Leg: Aquacel Ag Other: - adaptic Secondary Dressing: Wound #1 Left,Medial Lower Leg: Boardered Foam Dressing Dressing Change Frequency: Wound #1 Left,Medial Lower Leg: Change dressing every other day. Follow-up Appointments: Wound #1 Left,Medial Lower Leg: Return Appointment in 1 week. Edema Control: Wound #1 Left,Medial Lower Leg: Elevate legs to the level of the heart and pump ankles as often as possible Additional Orders / Instructions: Wound #1 Left,Medial Lower Leg: Increase protein intake. Other: - Check Coumadin levels on early next week. #1. No reason to change the primary dressing. We appear to be making good progress. At the patient's husband's request. We changed her to simple border foam dressings Electronic Signature(s) Signed: 07/10/2017 4:13:20 PM By: Linton Ham MD Entered By: Linton Ham on 07/10/2017 12:15:06 Hartshorne, [name] ([id number]) -------------------------------------------------------------------------------- SuperBill Details [name]. Date of Service: 07/10/2017 Medical Record Patient Account Number: [id number] Number: Treating RN: Montey Hora 10-23-1926 (83 y.o. Other Clinician: Date of Birth/Sex: Female) Treating Kathleen Tamm Primary Care Provider: BABAOFF, MARCUS Provider/Extender: G Referring Provider: BABAOFF, MARCUS Weeks in Treatment: 4 Diagnosis Coding ICD-10  Codes Code Description L03.116 Cellulitis of left lower limb L97.822 Non-pressure chronic ulcer of other part of left lower leg with fat layer exposed I48.0 Paroxysmal atrial fibrillation Z79.01 Long term (current) use of anticoagulants Facility Procedures CPT4 Code: [id number] Description: 99213 - WOUND CARE VISIT-LEV 3 EST PT Modifier: Quantity: 1 Physician Procedures CPT4: Description Modifier Quantity Code [id number] - WC PHYS LEVEL 2 - EST PT 1 ICD-10 Description Diagnosis L97.822 Non-pressure chronic ulcer of other part of left lower leg with fat layer exposed Electronic Signature(s) Signed: 07/10/2017 4:13:20 PM By: Linton Ham MD Entered By: Linton Ham on 07/10/2017 12:15:19

## 2017-07-17 ENCOUNTER — Encounter: Payer: Medicare Other | Admitting: Internal Medicine

## 2017-07-17 DIAGNOSIS — L97822 Non-pressure chronic ulcer of other part of left lower leg with fat layer exposed: Secondary | ICD-10-CM | POA: Diagnosis not present

## 2017-07-18 NOTE — Progress Notes (Signed)
Brittany, [name] ([id number]) Visit Report for 07/17/2017 Arrival Information Details [name], [name]. Date of Service: 07/17/2017 11:00 AM Medical Record Patient Account Number: [id number] Number: Treating RN: Montey Hora 21-Mar-1926 (83 y.o. Other Clinician: Date of Birth/Sex: Female) Treating ROBSON, MICHAEL Primary Care Rhiana Morash: BABAOFF, MARCUS Vitali Seibert/Extender: G Referring Tiquan Bouch: BABAOFF, MARCUS Weeks in Treatment: 5 Visit Information History Since Last Visit Added or deleted any medications: No Patient Arrived: Ambulatory Any new allergies or adverse reactions: No Arrival Time: 11:07 Had a fall or experienced change in No Accompanied By: spouse activities of daily living that may affect Transfer Assistance: None risk of falls: Patient Identification Verified: Yes Signs or symptoms of abuse/neglect since last No Secondary Verification Process Yes visito Completed: Hospitalized since last visit: No Patient Requires Transmission- No Has Dressing in Place as Prescribed: Yes Based Precautions: Pain Present Now: Yes Patient Has Alerts: Yes Patient Alerts: Patient on Blood Thinner Coumadin No ABI DT pain/location Electronic Signature(s) Signed: 07/17/2017 3:49:32 PM By: Montey Hora Entered By: Montey Hora on 07/17/2017 11:08:06 Muston, [name] ([id number]) -------------------------------------------------------------------------------- Clinic Level of Care Assessment Details [name]. Date of Service: 07/17/2017 11:00 AM Medical Record Patient Account Number: [id number] Number: Treating RN: Montey Hora [birth year] (83 y.o. Other Clinician: Date of Birth/Sex: Female) Treating ROBSON, [name] Primary Care Johnnathan Hagemeister: BABAOFF, MARCUS Moni Rothrock/Extender: G Referring Maximillian Habibi: BABAOFF, MARCUS Weeks in Treatment: 5 Clinic Level of Care Assessment Items TOOL 4 Quantity Score []  - Use when only an  EandM is performed on FOLLOW-UP visit 0 ASSESSMENTS - Nursing Assessment / Reassessment X - Reassessment of Co-morbidities (includes updates in patient status) 1 10 X - Reassessment of Adherence to Treatment Plan 1 5 ASSESSMENTS - Wound and Skin Assessment / Reassessment X - Simple Wound Assessment / Reassessment - one wound 1 5 []  - Complex Wound Assessment / Reassessment - multiple wounds 0 []  - Dermatologic / Skin Assessment (not related to wound area) 0 ASSESSMENTS - Focused Assessment []  - Circumferential Edema Measurements - multi extremities 0 []  - Nutritional Assessment / Counseling / Intervention 0 X - Lower Extremity Assessment (monofilament, tuning fork, pulses) 1 5 []  - Peripheral Arterial Disease Assessment (using hand held doppler) 0 ASSESSMENTS - Ostomy and/or Continence Assessment and Care []  - Incontinence Assessment and Management 0 []  - Ostomy Care Assessment and Management (repouching, etc.) 0 PROCESS - Coordination of Care X - Simple Patient / Family Education for ongoing care 1 15 []  - Complex (extensive) Patient / Family Education for ongoing care 0 []  - Staff obtains Programmer, systems, Records, Test Results / Process Orders 0 []  - Staff telephones HHA, Nursing Homes / Clarify orders / etc 0 [name], [name] ([id number]) []  - Routine Transfer to another Facility (non-emergent condition) 0 []  - Routine Hospital Admission (non-emergent condition) 0 []  - New Admissions / Biomedical engineer / Ordering NPWT, Apligraf, etc. 0 []  - Emergency Hospital Admission (emergent condition) 0 X - Simple Discharge Coordination 1 10 []  - Complex (extensive) Discharge Coordination 0 PROCESS - Special Needs []  - Pediatric / Minor Patient Management 0 []  - Isolation Patient Management 0 []  - Hearing / Language / Visual special needs 0 []  - Assessment of Community assistance (transportation, D/C planning, etc.) 0 []  - Additional assistance / Altered mentation 0 []  - Support Surface(s)  Assessment (bed, cushion, seat, etc.) 0 INTERVENTIONS - Wound Cleansing / Measurement X - Simple Wound Cleansing - one wound 1 5 []  - Complex Wound Cleansing - multiple wounds 0  X - Wound Imaging (photographs - any number of wounds) 1 5 []  - Wound Tracing (instead of photographs) 0 X - Simple Wound Measurement - one wound 1 5 []  - Complex Wound Measurement - multiple wounds 0 INTERVENTIONS - Wound Dressings X - Small Wound Dressing one or multiple wounds 1 10 []  - Medium Wound Dressing one or multiple wounds 0 []  - Large Wound Dressing one or multiple wounds 0 []  - Application of Medications - topical 0 []  - Application of Medications - injection 0 [name], [name] ([id number]) INTERVENTIONS - Miscellaneous []  - External ear exam 0 []  - Specimen Collection (cultures, biopsies, blood, body fluids, etc.) 0 []  - Specimen(s) / Culture(s) sent or taken to Lab for analysis 0 []  - Patient Transfer (multiple staff / Harrel Lemon Lift / Similar devices) 0 []  - Simple Staple / Suture removal (25 or less) 0 []  - Complex Staple / Suture removal (26 or more) 0 []  - Hypo / Hyperglycemic Management (close monitor of Blood Glucose) 0 []  - Ankle / Brachial Index (ABI) - do not check if billed separately 0 X - Vital Signs 1 5 Has the patient been seen at the hospital within the last three years: Yes Total Score: 80 Level Of Care: New/Established - Level 3 Electronic Signature(s) Signed: 07/17/2017 3:49:32 PM By: Montey Hora Entered By: Montey Hora on 07/17/2017 14:07:10 [name] ([id number]) -------------------------------------------------------------------------------- Encounter Discharge Information Details [name]. Date of Service: 07/17/2017 11:00 AM Medical Record Patient Account Number: [id number] Number: Treating RN: Montey Hora [birth year] (83 y.o. Other Clinician: Date of Birth/Sex: Female) Treating ROBSON, MICHAEL Primary Care [name]:  BABAOFF, MARCUS Appolonia Ackert/Extender: G Referring [name]: BABAOFF, MARCUS Weeks in Treatment: 5 Encounter Discharge Information Items Discharge Pain Level: 0 Discharge Condition: Stable Ambulatory Status: Ambulatory Discharge Destination: Home Transportation: Private Auto Accompanied By: spouse Schedule Follow-up Appointment: Yes Medication Reconciliation completed and provided to Patient/Care No Django Nguyen: Provided on Clinical Summary of Care: 07/17/2017 Form Type Recipient Paper Patient MS Electronic Signature(s) Signed: 07/17/2017 4:21:28 PM By: Ruthine Dose Entered By: Ruthine Dose on 07/17/2017 11:36:22 Tinkey, [name] ([id number]) -------------------------------------------------------------------------------- Lower Extremity Assessment Details [name]. Date of Service: 07/17/2017 11:00 AM Medical Record Patient Account Number: [id number] Number: Treating RN: Montey Hora 1926-05-19 (83 y.o. Other Clinician: Date of Birth/Sex: Female) Treating ROBSON, MICHAEL Primary Care Zachary Nole: BABAOFF, MARCUS Mister Krahenbuhl/Extender: G Referring Arleene Settle: BABAOFF, MARCUS Weeks in Treatment: 5 Vascular Assessment Pulses: Dorsalis Pedis Palpable: [Left:Yes] Posterior Tibial Extremity colors, hair growth, and conditions: Extremity Color: [Left:Hyperpigmented] Hair Growth on Extremity: [Left:No] Temperature of Extremity: [Left:Warm] Capillary Refill: [Left:< 3 seconds] Electronic Signature(s) Signed: 07/17/2017 3:49:32 PM By: Montey Hora Entered By: Montey Hora on 07/17/2017 11:15:50 Diperna, [name] ([id number]) -------------------------------------------------------------------------------- Multi Wound Chart Details [name]. Date of Service: 07/17/2017 11:00 AM Medical Record Patient Account Number: [id number] Number: Treating RN: Montey Hora 15-Oct-1926 (83 y.o. Other Clinician: Date of  Birth/Sex: Female) Treating ROBSON, MICHAEL Primary Care Nikko Goldwire: BABAOFF, MARCUS Pearline Yerby/Extender: G Referring Terrez Ander: BABAOFF, MARCUS Weeks in Treatment: 5 Vital Signs Height(in): 63 Pulse(bpm): 86 Weight(lbs): 99 Blood Pressure 180/82 (mmHg): Body Mass Index(BMI): 18 Temperature(F): 98.3 Respiratory Rate 18 (breaths/min): Photos: [1:No Photos] [N/A:N/A] Wound Location: [1:Left Lower Leg - Medial] [N/A:N/A] Wounding Event: [1:Trauma] [N/A:N/A] Primary Etiology: [1:Trauma, Other] [N/A:N/A] Comorbid History: [1:Cataracts, Arrhythmia, Hypertension, Osteoarthritis, Dementia] [N/A:N/A] Date Acquired: [1:05/06/2017] [N/A:N/A] Weeks of Treatment: [1:5] [N/A:N/A] Wound Status: [1:Open] [N/A:N/A] Measurements L x W x D 2x0.9x0.1 [N/A:N/A] (cm)  Area (cm) : [1:1.414] [N/A:N/A] Volume (cm) : [1:0.141] [N/A:N/A] % Reduction in Area: [1:0.00%] [N/A:N/A] % Reduction in Volume: 0.00% [N/A:N/A] Classification: [1:Full Thickness Without Exposed Support Structures] [N/A:N/A] Exudate Amount: [1:Large] [N/A:N/A] Exudate Type: [1:Serosanguineous] [N/A:N/A] Exudate Color: [1:red, brown] [N/A:N/A] Wound Margin: [1:Flat and Intact] [N/A:N/A] Granulation Amount: [1:Large (67-100%)] [N/A:N/A] Granulation Quality: [1:Red] [N/A:N/A] Necrotic Amount: [1:Small (1-33%)] [N/A:N/A] Exposed Structures: [1:Fat Layer (Subcutaneous Tissue) Exposed: Yes] [N/A:N/A] Fascia: No Tendon: No Muscle: No Joint: No Bone: No Epithelialization: None N/A N/A Periwound Skin Texture: Excoriation: Yes N/A N/A Periwound Skin No Abnormalities Noted N/A N/A Moisture: Periwound Skin Color: Ecchymosis: Yes N/A N/A Erythema: Yes Erythema Location: Circumferential N/A N/A Temperature: No Abnormality N/A N/A Tenderness on Yes N/A N/A Palpation: Wound Preparation: Ulcer Cleansing: N/A N/A Rinsed/Irrigated with Saline Topical Anesthetic Applied: Other: lidocaine 4% Treatment Notes Electronic  Signature(s) Signed: 07/17/2017 4:14:11 PM By: Linton Ham MD Entered By: Linton Ham on 07/17/2017 12:08:21 [name] ([id number]) -------------------------------------------------------------------------------- Multi-Disciplinary Care Plan Details Patient Name: EMMANUELLA, MIRANTE. Date of Service: 07/17/2017 11:00 AM Medical Record Patient Account Number: [id number] Number: Treating RN: Montey Hora 1926-06-21 (83 y.o. Other Clinician: Date of Birth/Sex: Female) Treating ROBSON, MICHAEL Primary Care Dynesha Woolen: BABAOFF, MARCUS Tikisha Molinaro/Extender: G Referring Wandy Bossler: BABAOFF, MARCUS Weeks in Treatment: 5 Active Inactive ` Orientation to the Wound Care Program Nursing Diagnoses: Knowledge deficit related to the wound healing center program Goals: Patient/caregiver will verbalize understanding of the [place] Program Date Initiated: 06/26/2017 Target Resolution Date: 07/16/2017 Goal Status: Active Interventions: Provide education on orientation to the wound center Notes: ` Wound/Skin Impairment Nursing Diagnoses: Impaired tissue integrity Goals: Ulcer/skin breakdown will heal within 14 weeks Date Initiated: 06/26/2017 Target Resolution Date: 09/17/2017 Goal Status: Active Interventions: Assess ulceration(s) every visit Treatment Activities: Skin care regimen initiated : 06/26/2017 Notes: YOCELIN, VANLUE ([id number]) Electronic Signature(s) Signed: 07/17/2017 3:49:32 PM By: Montey Hora Entered By: Montey Hora on 07/17/2017 11:16:00 [name] ([id number]) -------------------------------------------------------------------------------- Pain Assessment Details [name]. Date of Service: 07/17/2017 11:00 AM Medical Record Patient Account Number: [id number] Number: Treating RN: Montey Hora 21-Jul-1926 (83 y.o. Other Clinician: Date of Birth/Sex: Female) Treating ROBSON, MICHAEL Primary  Care Fermon Ureta: BABAOFF, MARCUS Adanya Sosinski/Extender: G Referring Hayden Mabin: BABAOFF, MARCUS Weeks in Treatment: 5 Active Problems Location of Pain Severity and Description of Pain Patient Has Paino Yes Site Locations Pain Location: Pain in Ulcers With Dressing Change: Yes Duration of the Pain. Constant / Intermittento Intermittent Pain Management and Medication Current Pain Management: Notes Topical or injectable lidocaine is offered to patient for acute pain when surgical debridement is performed. If needed, Patient is instructed to use over the counter pain medication for the following 24-48 hours after debridement. Wound care MDs do not prescribed pain medications. Patient has chronic pain or uncontrolled pain. Patient has been instructed to make an appointment with their Primary Care Physician for pain management. Electronic Signature(s) Signed: 07/17/2017 3:49:32 PM By: Montey Hora Entered By: Montey Hora on 07/17/2017 11:09:18 [name] ([id number]) -------------------------------------------------------------------------------- Patient/Caregiver Education Details [name] Date of Service: 07/17/2017 11:00 AM Medical Record Patient Account Number: [id number] Number: Treating RN: Montey Hora Nov 09, 1925 (83 y.o. Other Clinician: Date of Birth/Gender: Female) Treating ROBSON, MICHAEL Primary Care Physician: BABAOFF, MARCUS Physician/Extender: G Referring Physician: BABAOFF, MARCUS Weeks in Treatment: 5 Education Assessment Education Provided To: Patient and Caregiver Education Topics Provided Wound/Skin Impairment: Handouts: Other: wound care as ordered Methods: Demonstration, Explain/Verbal Responses: State content correctly Electronic Signature(s) Signed:  07/17/2017 3:49:32 PM By: Montey Hora Entered By: Montey Hora on 07/17/2017 11:26:39 [name]  ([id number]) -------------------------------------------------------------------------------- Wound Assessment Details Patient Name: KATHRYNNE, KULINSKI C. Date of Service: 07/17/2017 11:00 AM Medical Record Patient Account Number: [id number] Number: Treating RN: Montey Hora 1926-06-30 (83 y.o. Other Clinician: Date of Birth/Sex: Female) Treating ROBSON, MICHAEL Primary Care Amos Gaber: BABAOFF, MARCUS Penny Arrambide/Extender: G Referring Joycelin Radloff: BABAOFF, MARCUS Weeks in Treatment: 5 Wound Status Wound Number: 1 Primary Trauma, Other Etiology: Wound Location: Left Lower Leg - Medial Wound Open Wounding Event: Trauma Status: Date Acquired: 05/06/2017 Comorbid Cataracts, Arrhythmia, Hypertension, Weeks Of Treatment: 5 History: Osteoarthritis, Dementia Clustered Wound: No Photos Photo Uploaded By: Montey Hora on 07/17/2017 12:49:59 Wound Measurements Length: (cm) 2 Width: (cm) 0.9 Depth: (cm) 0.1 Area: (cm) 1.414 Volume: (cm) 0.141 % Reduction in Area: 0% % Reduction in Volume: 0% Epithelialization: None Tunneling: No Undermining: No Wound Description Full Thickness Without Exposed Classification: Support Structures Wound Margin: Flat and Intact Exudate Large Amount: Exudate Type: Serosanguineous Exudate Color: red, brown Foul Odor After Cleansing: No Slough/Fibrino No Wound Bed Granulation Amount: Large (67-100%) Exposed Structure Mak, Getsemani C. ([id number]) Granulation Quality: Red Fascia Exposed: No Necrotic Amount: Small (1-33%) Fat Layer (Subcutaneous Tissue) Exposed: Yes Necrotic Quality: Adherent Slough Tendon Exposed: No Muscle Exposed: No Joint Exposed: No Bone Exposed: No Periwound Skin Texture Texture Color No Abnormalities Noted: No No Abnormalities Noted: No Excoriation: Yes Ecchymosis: Yes Erythema: Yes Moisture Erythema Location: Circumferential No Abnormalities Noted: No Temperature / Pain Temperature: No  Abnormality Tenderness on Palpation: Yes Wound Preparation Ulcer Cleansing: Rinsed/Irrigated with Saline Topical Anesthetic Applied: Other: lidocaine 4%, Treatment Notes Wound #1 (Left, Medial Lower Leg) 1. Cleansed with: Clean wound with Normal Saline 2. Anesthetic Topical Lidocaine 4% cream to wound bed prior to debridement 4. Dressing Applied: Hydrafera Blue Contact layer 5. Secondary [place] Signature(s) Signed: 07/17/2017 3:49:32 PM By: Montey Hora Entered By: Montey Hora on 07/17/2017 11:15:35 [name] ([id number]) -------------------------------------------------------------------------------- Vitals Details [name] Date of Service: 07/17/2017 11:00 AM Medical Record Patient Account Number: [id number] Number: Treating RN: Montey Hora 1926-05-10 (83 y.o. Other Clinician: Date of Birth/Sex: Female) Treating ROBSON, MICHAEL Primary Care Celedonio Sortino: BABAOFF, MARCUS Rito Lecomte/Extender: G Referring Tyshell Ramberg: BABAOFF, MARCUS Weeks in Treatment: 5 Vital Signs Time Taken: 11:10 Temperature (F): 98.3 Height (in): 63 Pulse (bpm): 86 Weight (lbs): 99 Respiratory Rate (breaths/min): 18 Body Mass Index (BMI): 17.5 Blood Pressure (mmHg): 180/82 Reference Range: 80 - 120 mg / dl Electronic Signature(s) Signed: 07/17/2017 3:49:32 PM By: Montey Hora Entered By: Montey Hora on 07/17/2017 11:10:47

## 2017-07-18 NOTE — Progress Notes (Signed)
SHERRONDA, SWEIGERT ([id number]) Visit Report for 07/17/2017 HPI Details [name], [name]. Date of Service: 07/17/2017 11:00 AM Medical Record Patient Account Number: [id number] Number: Treating RN: Montey Hora 05-03-1926 (83 y.o. Other Clinician: Date of Birth/Sex: Female) Treating Lyncoln Maskell Primary Care Provider: BABAOFF, MARCUS Provider/Extender: G Referring Provider: BABAOFF, MARCUS Weeks in Treatment: 5 History of Present Illness HPI Description: 06/06/17 on evaluation today patient presents for evaluation concerning an ulcer which she initially had arise as a result of striking her left lower extremity money bed frame. With that being said this was roughly one month ago and unfortunately the wound despite two courses of Keflex have not really made a dramatic improvement. This has continued to drain as well as being exquisitely tender at times. Even to the point that she is having a difficult time walking. Patient does have chronic atrial fibrillation which subsequently leads to her being on long-term anticoagulant therapy and this causes ED bruising which may have contributed to this entry and where things are at this point. She does have valvular heart disease as well as hypertension and her last INR was 2.7. Currently Neosporin, Gauls, and an ace wrap has been utilized. Keflex is the antibiotic that she has been on. Patient has not had a culture up to this point and she states that her blood pressure at the primary care office yesterday was 138/72. Her pain is significant related to be an eight out of 10. No fevers, chills, nausea, or vomiting noted at this time. 06/20/17; patient was admitted here 2 weeks ago. She had a traumatic wound on her left lateral lower leg probably in the setting of some degree of venous insufficiency with surrounding cellulitis. She had been on Keflex, after we saw her she was put on doxycycline which she apparently did not  tolerate. Culture that was done at the time showed Morganella which was resistant to Keflex. Apparently the patient continued to take Keflex after the appointment. She is also on Coumadin. I had tried to change her antibiotic when the Morganella culture was brought to my attention however apparently our staff could not reach the patient to inform them of the antibiotic change 06/26/17; patient has wound on her left lateral lower extremity in the setting of some degree of venous insufficiency. I gave her cefdinir last week and she is completing this. There is no evidence of surrounding infection. Aggressive debridement last week, wound bed looks healthier. We've been using Aquacel. They're traveling in the [place] next week we'll see her back in 2 weeks unless there are problems. 07/10/17; the patient is been using Aquacel Ag her husband is changing this with Kerlix and conform. She still complains of a lot of pain. Apparently with the antibiotics. We gave 2 or 3 weeks ago. Her INR went up to 6, this was managed by her primary physician 07/17/17; she is using Aquacel Ag and a border foam. Still discomfort although dimensions are better Electronic Signature(s) Signed: 07/17/2017 4:14:11 PM By: Linton Ham MD Entered By: Linton Ham on 07/17/2017 12:11:03 [name] ([id number]) -------------------------------------------------------------------------------- Physical Exam Details [name]. Date of Service: 07/17/2017 11:00 AM Medical Record Patient Account Number: [id number] Number: Treating RN: Montey Hora 07-24-1926 (83 y.o. Other Clinician: Date of Birth/Sex: Female) Treating Franshesca Chipman Primary Care Provider: BABAOFF, MARCUS Provider/Extender: G Referring Provider: BABAOFF, MARCUS Weeks in Treatment: 5 Constitutional Patient is hypertensive.. Pulse regular and within target range for patient.Marland Kitchen Respirations regular, non-labored and  within target range.. Temperature is normal and within the target range for the patient.Marland Kitchen appears in no distress. Notes Wound exam; no erythema around the wound. There is no evidence of surrounding cellulitis. The wound itself has some surface debris although elected not to debride the wound surface as the dimensions have reduced nicely Electronic Signature(s) Signed: 07/17/2017 4:14:11 PM By: Linton Ham MD Entered By: Linton Ham on 07/17/2017 12:12:17 [name] ([id number]) -------------------------------------------------------------------------------- Physician Orders Details [name]. Date of Service: 07/17/2017 11:00 AM Medical Record Patient Account Number: [id number] Number: Treating RN: Montey Hora 08/22/1926 (83 y.o. Other Clinician: Date of Birth/Sex: Female) Treating Shaleen Talamantez Primary Care Provider: BABAOFF, MARCUS Provider/Extender: G Referring Provider: BABAOFF, MARCUS Weeks in Treatment: 5 Verbal / Phone Orders: No Diagnosis Coding Wound Cleansing Wound #1 Left,Medial Lower Leg o Cleanse wound with mild soap and water o May Shower, gently pat wound dry prior to applying new dressing. Anesthetic Wound #1 Left,Medial Lower Leg o Topical Lidocaine 4% cream applied to wound bed prior to debridement Primary Wound Dressing Wound #1 Left,Medial Lower Leg o Aquacel Ag o Other: - adaptic Secondary Dressing Wound #1 Left,Medial Lower Leg o Boardered Foam Dressing Dressing Change Frequency Wound #1 Left,Medial Lower Leg o Change dressing every other day. Follow-up Appointments Wound #1 Left,Medial Lower Leg o Return Appointment in 1 week. Edema Control Wound #1 Left,Medial Lower Leg o Elevate legs to the level of the heart and pump ankles as often as possible Additional Orders / Instructions Wound #1 Left,Medial Lower Leg o Increase protein intake. TOY, SAMARIN ([id number]) o  Other: - Check Coumadin levels on early next week. Electronic Signature(s) Signed: 07/17/2017 3:49:32 PM By: Montey Hora Signed: 07/17/2017 4:14:11 PM By: Linton Ham MD Entered By: Montey Hora on 07/17/2017 11:28:35 [name] ([id number]) -------------------------------------------------------------------------------- Problem List Details Patient Name: LAYLA, KESLING. Date of Service: 07/17/2017 11:00 AM Medical Record Patient Account Number: [id number] Number: Treating RN: Montey Hora [birth year] (83 y.o. 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 Date of Service: 07/17/2017 11:00 AM Medical Record Patient Account Number: [id number] Number: Treating RN: Montey Hora 11-24-1925 (83 y.o. Other Clinician: Date of Birth/Sex: Female) Treating Tye Juarez Primary Care Provider: BABAOFF, MARCUS Provider/Extender: G Referring Provider: BABAOFF, MARCUS Weeks in Treatment: 5 Subjective History of Present Illness (HPI) 06/06/17 on evaluation today patient presents for evaluation concerning an ulcer which she initially had arise as  a result of striking her left lower extremity money bed frame. With that being said this was roughly one month ago and unfortunately the wound despite two courses of Keflex have not really made a dramatic improvement. This has continued to drain as well as being exquisitely tender at times. Even to the point that she is having a difficult time walking. Patient does have chronic atrial fibrillation which subsequently leads to her being on long-term anticoagulant therapy and this causes  ED bruising which may have contributed to this entry and where things are at this point. She does have valvular heart disease as well as hypertension and her last INR was 2.7. Currently Neosporin, Gauls, and an ace wrap has been utilized. Keflex is the antibiotic that she has been on. Patient has not had a culture up to this point and she states that her blood pressure at the primary care office yesterday was 138/72. Her pain is significant related to be an eight out of 10. No fevers, chills, nausea, or vomiting noted at this time. 06/20/17; patient was admitted here 2 weeks ago. She had a traumatic wound on her left lateral lower leg probably in the setting of some degree of venous insufficiency with surrounding cellulitis. She had been on Keflex, after we saw her she was put on doxycycline which she apparently did not tolerate. Culture that was done at the time showed Morganella which was resistant to Keflex. Apparently the patient continued to take Keflex after the appointment. She is also on Coumadin. I had tried to change her antibiotic when the Morganella culture was brought to my attention however apparently our staff could not reach the patient to inform them of the antibiotic change 06/26/17; patient has wound on her left lateral lower extremity in the setting of some degree of venous insufficiency. I gave her cefdinir last week and she is completing this. There is no evidence of surrounding infection.  Aggressive debridement last week, wound bed looks healthier. We've been using Aquacel. They're traveling in the [place] next week we'll see her back in 2 weeks unless there are problems. 07/10/17; the patient is been using Aquacel Ag her husband is changing this with Kerlix and conform. She still complains of a lot of pain. Apparently with the antibiotics. We gave 2 or 3 weeks ago. Her INR went up to 6, this was managed by her primary physician 07/17/17; she is using Aquacel Ag and a border foam. Still discomfort although dimensions are better Objective Eisen, Tabia C. ([id number]) Constitutional Patient is hypertensive.. Pulse regular and within target range for patient.Marland Kitchen Respirations regular, non-labored and within target range.. Temperature is normal and within the target range for the patient.Marland Kitchen appears in no distress. Vitals Time Taken: 11:10 AM, Height: 63 in, Weight: 99 lbs, BMI: 17.5, Temperature: 98.3 F, Pulse: 86 bpm, Respiratory Rate: 18 breaths/min, Blood Pressure: 180/82 mmHg. General Notes: Wound exam; no erythema around the wound. There is no evidence of surrounding cellulitis. The wound itself has some surface debris although elected not to debride the wound surface as the dimensions have reduced nicely Integumentary (Hair, Skin) Wound #1 status is Open. Original cause of wound was Trauma. The wound is located on the Left,Medial Lower Leg. The wound measures 2cm length x 0.9cm width x 0.1cm depth; 1.414cm^2 area and 0.141cm^3 volume. There is Fat Layer (Subcutaneous Tissue) Exposed exposed. There is no tunneling or undermining noted. There is a large amount of serosanguineous drainage noted. The wound margin is flat and intact. There is large (67-100%) red granulation within the wound bed. There is a small (1-33%) amount of necrotic tissue within the wound bed including Adherent Slough. The periwound skin appearance exhibited: Excoriation, Ecchymosis, Erythema. The  surrounding wound skin color is noted with erythema which is circumferential. Periwound temperature was noted as No Abnormality. The periwound has tenderness on palpation. Assessment Active Problems ICD-10 L03.116 - Cellulitis of left lower limb L97.822 - Non-pressure chronic ulcer of other part of left lower leg  with fat layer exposed I48.0 - Paroxysmal atrial fibrillation Z79.01 - Long term (current) use of anticoagulants Plan Wound Cleansing: Wound #1 Left,Medial Lower Leg: Cleanse wound with mild soap and water May Shower, gently pat wound dry prior to applying new dressing. FIONNUALA, HEMMERICH ([id number]) Anesthetic: Wound #1 Left,Medial Lower Leg: Topical Lidocaine 4% cream applied to wound bed prior to debridement Primary Wound Dressing: Wound #1 Left,Medial Lower Leg: Aquacel Ag Other: - adaptic Secondary Dressing: Wound #1 Left,Medial Lower Leg: Boardered Foam Dressing Dressing Change Frequency: Wound #1 Left,Medial Lower Leg: Change dressing every other day. Follow-up Appointments: Wound #1 Left,Medial Lower Leg: Return Appointment in 1 week. Edema Control: Wound #1 Left,Medial Lower Leg: Elevate legs to the level of the heart and pump ankles as often as possible Additional Orders / Instructions: Wound #1 Left,Medial Lower Leg: Increase protein intake. Other: - Check Coumadin levels on early next week. #1 continue the Aquacel Ag border foam change every 2 days #2 dimensions are better #3 as long as the dimensions continued to improve nothing further will need to be done until this closes. If it stalls she'll need further debridement Electronic Signature(s) Signed: 07/17/2017 4:14:11 PM By: Linton Ham MD Entered By: Linton Ham on 07/17/2017 12:13:00 [name] ([id number]) -------------------------------------------------------------------------------- SuperBill Details [name] Date of Service: 07/17/2017 Medical  Record Patient Account Number: [id number] Number: Treating RN: Montey Hora November 20, 1925 (83 y.o. Other Clinician: Date of Birth/Sex: Female) Treating Pepper Wyndham Primary Care Provider: BABAOFF, MARCUS Provider/Extender: G Referring Provider: BABAOFF, MARCUS Weeks in Treatment: 5 Diagnosis Coding ICD-10 Codes Code Description L03.116 Cellulitis of left lower limb L97.822 Non-pressure chronic ulcer of other part of left lower leg with fat layer exposed I48.0 Paroxysmal atrial fibrillation Z79.01 Long term (current) use of anticoagulants Facility Procedures CPT4 Code: [id number] Description: 99213 - WOUND CARE VISIT-LEV 3 EST PT Modifier: Quantity: 1 Physician Procedures CPT4: Description Modifier Quantity Code [id number] - WC PHYS LEVEL 2 - EST PT 1 ICD-10 Description Diagnosis L97.822 Non-pressure chronic ulcer of other part of left lower leg with fat layer exposed Electronic Signature(s) Signed: 07/17/2017 3:49:32 PM By: Montey Hora Signed: 07/17/2017 4:14:11 PM By: Linton Ham MD Entered By: Montey Hora on 07/17/2017 14:07:19

## 2017-07-24 ENCOUNTER — Encounter: Payer: Medicare Other | Attending: Internal Medicine | Admitting: Internal Medicine

## 2017-07-24 DIAGNOSIS — Z7901 Long term (current) use of anticoagulants: Secondary | ICD-10-CM | POA: Diagnosis not present

## 2017-07-24 DIAGNOSIS — L97822 Non-pressure chronic ulcer of other part of left lower leg with fat layer exposed: Secondary | ICD-10-CM | POA: Diagnosis present

## 2017-07-24 DIAGNOSIS — I10 Essential (primary) hypertension: Secondary | ICD-10-CM | POA: Insufficient documentation

## 2017-07-24 DIAGNOSIS — I480 Paroxysmal atrial fibrillation: Secondary | ICD-10-CM | POA: Insufficient documentation

## 2017-07-24 DIAGNOSIS — L03116 Cellulitis of left lower limb: Secondary | ICD-10-CM | POA: Diagnosis not present

## 2017-07-26 NOTE — Progress Notes (Signed)
[name], [name] ([id number]) Visit Report for 07/24/2017 Arrival Information Details [name], [name]. Date of Service: 07/24/2017 1:30 PM Medical Record Patient Account Number: [id number] Number: Treating RN: Montey Hora [birth year] (83 y.o. Other Clinician: Date of Birth/Sex: Female) Treating ROBSON, MICHAEL Primary Care Kanoa Phillippi: BABAOFF, MARCUS Monice Lundy/Extender: G Referring Indyah Saulnier: BABAOFF, MARCUS Weeks in Treatment: 6 Visit Information History Since Last Visit Added or deleted any medications: No Patient Arrived: Ambulatory Any new allergies or adverse reactions: No Arrival Time: 13:44 Had a fall or experienced change in No Accompanied By: spouse activities of daily living that may affect Transfer Assistance: None risk of falls: Patient Identification Verified: Yes Signs or symptoms of abuse/neglect since last No Secondary Verification Process Yes visito Completed: Hospitalized since last visit: No Patient Requires Transmission- No Has Dressing in Place as Prescribed: Yes Based Precautions: Pain Present Now: No Patient Has Alerts: Yes Patient Alerts: Patient on Blood Thinner Coumadin No ABI DT pain/location Electronic Signature(s) Signed: 07/24/2017 5:06:38 PM By: Montey Hora Entered By: Montey Hora on 07/24/2017 13:45:09 [name], [name] ([id number]) -------------------------------------------------------------------------------- Encounter Discharge Information Details [name]. Date of Service: 07/24/2017 1:30 PM Medical Record Patient Account Number: [id number] Number: Treating RN: Montey Hora 02-Aug-1926 (83 y.o. Other Clinician: Date of Birth/Sex: Female) Treating ROBSON, MICHAEL Primary Care Azzam Mehra: BABAOFF, MARCUS Lillieanna Tuohy/Extender: G Referring Maitland Muhlbauer: BABAOFF, MARCUS Weeks in Treatment: 6 Encounter Discharge Information Items Discharge Pain Level: 0 Discharge Condition:  Stable Ambulatory Status: Ambulatory Discharge Destination: Home Transportation: Private Auto Accompanied By: spouse Schedule Follow-up Appointment: Yes Medication Reconciliation completed and provided to Patient/Care No Kacin Dancy: Provided on Clinical Summary of Care: 07/24/2017 Form Type Recipient Paper Patient [place] Electronic Signature(s) Signed: 07/24/2017 5:06:38 PM By: Montey Hora Entered By: Montey Hora on 07/24/2017 14:41:37 Takach, [name] ([id number]) -------------------------------------------------------------------------------- Lower Extremity Assessment Details [name]. Date of Service: 07/24/2017 1:30 PM Medical Record Patient Account Number: [id number] Number: Treating RN: Montey Hora [birth year] (83 y.o. Other Clinician: Date of Birth/Sex: Female) Treating ROBSON, MICHAEL Primary Care Jimie Kuwahara: BABAOFF, MARCUS Yameli Delamater/Extender: G Referring Curtina Grills: BABAOFF, MARCUS Weeks in Treatment: 6 Vascular Assessment Pulses: Dorsalis Pedis Palpable: [Left:Yes] Posterior Tibial Extremity colors, hair growth, and conditions: Extremity Color: [Left:Hyperpigmented] Hair Growth on Extremity: [Left:No] Temperature of Extremity: [Left:Warm] Capillary Refill: [Left:< 3 seconds] Electronic Signature(s) Signed: 07/24/2017 5:06:38 PM By: Montey Hora Entered By: Montey Hora on 07/24/2017 13:54:28 [name], [name] ([id number]) -------------------------------------------------------------------------------- Multi Wound Chart Details [name]. Date of Service: 07/24/2017 1:30 PM Medical Record Patient Account Number: [id number] Number: Treating RN: Montey Hora June 27, 1926 (83 y.o. Other Clinician: Date of Birth/Sex: Female) Treating ROBSON, MICHAEL Primary Care Ebelyn Bohnet: BABAOFF, MARCUS Bettina Warn/Extender: G Referring Feliz Lincoln: BABAOFF, MARCUS Weeks in Treatment: 6 Vital Signs Height(in):  63 Pulse(bpm): 87 Weight(lbs): 99 Blood Pressure 161/85 (mmHg): Body Mass Index(BMI): 18 Temperature(F): 97.9 Respiratory Rate 18 (breaths/min): Photos: [1:No Photos] [N/A:N/A] Wound Location: [1:Left Lower Leg - Medial] [N/A:N/A] Wounding Event: [1:Trauma] [N/A:N/A] Primary Etiology: [1:Trauma, Other] [N/A:N/A] Comorbid History: [1:Cataracts, Arrhythmia, Hypertension, Osteoarthritis, Dementia] [N/A:N/A] Date Acquired: [1:05/06/2017] [N/A:N/A] Weeks of Treatment: [1:6] [N/A:N/A] Wound Status: [1:Open] [N/A:N/A] Measurements L x W x D 1.9x0.7x0.1 [N/A:N/A] (cm) Area (cm) : [1:1.045] [N/A:N/A] Volume (cm) : [1:0.104] [N/A:N/A] % Reduction in Area: [1:26.10%] [N/A:N/A] % Reduction in Volume: 26.20% [N/A:N/A] Classification: [1:Full Thickness Without Exposed Support Structures] [N/A:N/A] Exudate Amount: [1:Large] [N/A:N/A] Exudate Type: [1:Serosanguineous] [N/A:N/A] Exudate Color: [1:red, brown] [N/A:N/A] Wound Margin: [1:Flat and Intact] [N/A:N/A] Granulation Amount: [1:Large (67-100%)] [N/A:N/A]  Granulation Quality: [1:Red] [N/A:N/A] Necrotic Amount: [1:Small (1-33%)] [N/A:N/A] Exposed Structures: [1:Fat Layer (Subcutaneous Tissue) Exposed: Yes] [N/A:N/A] Fascia: No Tendon: No Muscle: No Joint: No Bone: No Epithelialization: None N/A N/A Debridement: Debridement ([id number]- N/A N/A 11047) Pre-procedure 13:57 N/A N/A Verification/Time Out Taken: Pain Control: Lidocaine 4% Topical N/A N/A Solution Tissue Debrided: Fibrin/Slough, N/A N/A Subcutaneous Level: Skin/Subcutaneous N/A N/A Tissue Debridement Area (sq 1.33 N/A N/A cm): Instrument: Curette N/A N/A Bleeding: Minimum N/A N/A Hemostasis Achieved: Pressure N/A N/A Procedural Pain: 0 N/A N/A Post Procedural Pain: 0 N/A N/A Debridement Treatment Procedure was tolerated N/A N/A Response: well Post Debridement 1.9x0.9x0.1 N/A N/A Measurements L x W x D (cm) Post Debridement 0.134 N/A N/A Volume:  (cm) Periwound Skin Texture: Excoriation: Yes N/A N/A Periwound Skin No Abnormalities Noted N/A N/A Moisture: Periwound Skin Color: Ecchymosis: Yes N/A N/A Erythema: Yes Erythema Location: Circumferential N/A N/A Temperature: No Abnormality N/A N/A Tenderness on Yes N/A N/A Palpation: Wound Preparation: Ulcer Cleansing: N/A N/A Rinsed/Irrigated with Saline Topical Anesthetic Applied: Other: lidocaine 4% Procedures Performed: Debridement N/A N/A [name], SISTARE. ([id number]) Treatment Notes Wound #1 (Left, Medial Lower Leg) 1. Cleansed with: Clean wound with Normal Saline 2. Anesthetic Topical Lidocaine 4% cream to wound bed prior to debridement 4. Dressing Applied: Aquacel Ag Contact layer 5. Secondary Dressing Applied Bordered Foam Dressing Electronic Signature(s) Signed: 07/25/2017 8:40:51 AM By: Linton Ham MD Entered By: Linton Ham on 07/24/2017 15:31:10 [name] ([id number]) -------------------------------------------------------------------------------- Multi-Disciplinary Care Plan Details [name], [name]. Date of Service: 07/24/2017 1:30 PM Medical Record Patient Account Number: [id number] Number: Treating RN: Montey Hora 01-19-1926 (83 y.o. Other Clinician: Date of Birth/Sex: Female) Treating ROBSON, MICHAEL Primary Care Temitope Griffing: BABAOFF, MARCUS Lacrystal Barbe/Extender: G Referring Shaunae Sieloff: BABAOFF, MARCUS Weeks in Treatment: 6 Active Inactive ` Orientation to the Wound Care Program Nursing Diagnoses: Knowledge deficit related to the wound healing center program Goals: Patient/caregiver will verbalize understanding of the [facility] Date Initiated: 06/26/2017 Target Resolution Date: 07/16/2017 Goal Status: Active Interventions: Provide education on orientation to the wound center Notes: ` Wound/Skin Impairment Nursing Diagnoses: Impaired tissue integrity Goals: Ulcer/skin breakdown will  heal within 14 weeks Date Initiated: 06/26/2017 Target Resolution Date: 09/17/2017 Goal Status: Active Interventions: Assess ulceration(s) every visit Treatment Activities: Skin care regimen initiated : 06/26/2017 Notes: [name], [name] ([id number]) Electronic Signature(s) Signed: 07/24/2017 5:06:38 PM By: Montey Hora Entered By: Montey Hora on 07/24/2017 13:54:32 [name], [name] ([id number]) -------------------------------------------------------------------------------- Pain Assessment Details [name]. Date of Service: 07/24/2017 1:30 PM Medical Record Patient Account Number: [id number] Number: Treating RN: Montey Hora [birth year] (83 y.o. Other Clinician: Date of Birth/Sex: Female) Treating ROBSON, MICHAEL Primary Care Anastasio Wogan: BABAOFF, MARCUS Aldora Perman/Extender: G Referring Caspian Deleonardis: BABAOFF, MARCUS Weeks in Treatment: 6 Active Problems Location of Pain Severity and Description of Pain Patient Has Paino No Site Locations Pain Management and Medication Current Pain Management: Notes Topical or injectable lidocaine is offered to patient for acute pain when surgical debridement is performed. If needed, Patient is instructed to use over the counter pain medication for the following 24-48 hours after debridement. Wound care MDs do not prescribed pain medications. Patient has chronic pain or uncontrolled pain. Patient has been instructed to make an appointment with their Primary Care Physician for pain management. Electronic Signature(s) Signed: 07/24/2017 5:06:38 PM By: Montey Hora Entered By: Montey Hora on 07/24/2017 13:45:19 [name] ([id number]) -------------------------------------------------------------------------------- Patient/Caregiver Education Details [name] Date of Service: 07/24/2017 1:30  PM Medical Record Patient Account Number: [id number] Number: Treating RN:  Montey Hora 1925-11-24 (83 y.o. Other Clinician: Date of Birth/Gender: Female) Treating ROBSON, MICHAEL Primary Care Physician: BABAOFF, MARCUS Physician/Extender: G Referring Physician: BABAOFF, MARCUS Weeks in Treatment: 6 Education Assessment Education Provided To: Patient and Caregiver Education Topics Provided Wound/Skin Impairment: Handouts: Other: wound care to continue as ordered Methods: Demonstration, Explain/Verbal Responses: State content correctly Electronic Signature(s) Signed: 07/24/2017 5:06:38 PM By: Montey Hora Entered By: Montey Hora on 07/24/2017 14:41:57 [name], [name] ([id number]) -------------------------------------------------------------------------------- Wound Assessment Details Patient Name: Brittany Lolling C. Date of Service: 07/24/2017 1:30 PM Medical Record Patient Account Number: [id number] Number: Treating RN: Montey Hora [birth year] (83 y.o. Other Clinician: Date of Birth/Sex: Female) Treating ROBSON, MICHAEL Primary Care Lourene Hoston: BABAOFF, MARCUS Elin Fenley/Extender: G Referring Shakhia Gramajo: BABAOFF, MARCUS Weeks in Treatment: 6 Wound Status Wound Number: 1 Primary Trauma, Other Etiology: Wound Location: Left Lower Leg - Medial Wound Open Wounding Event: Trauma Status: Date Acquired: 05/06/2017 Comorbid Cataracts, Arrhythmia, Hypertension, Weeks Of Treatment: 6 History: Osteoarthritis, Dementia Clustered Wound: No Photos Photo Uploaded By: Montey Hora on 07/24/2017 16:54:13 Wound Measurements Length: (cm) 1.9 Width: (cm) 0.7 Depth: (cm) 0.1 Area: (cm) 1.045 Volume: (cm) 0.104 % Reduction in Area: 26.1% % Reduction in Volume: 26.2% Epithelialization: None Tunneling: No Undermining: No Wound Description Full Thickness Without Exposed Classification: Support Structures Wound Margin: Flat and Intact Exudate Large Amount: Exudate Type: Serosanguineous Exudate Color: red, brown Foul Odor After  Cleansing: No Slough/Fibrino No Wound Bed Granulation Amount: Large (67-100%) Exposed Structure [name], [name] ([id number]) Granulation Quality: Red Fascia Exposed: No Necrotic Amount: Small (1-33%) Fat Layer (Subcutaneous Tissue) Exposed: Yes Necrotic Quality: Adherent Slough Tendon Exposed: No Muscle Exposed: No Joint Exposed: No Bone Exposed: No Periwound Skin Texture Texture Color No Abnormalities Noted: No No Abnormalities Noted: No Excoriation: Yes Ecchymosis: Yes Erythema: Yes Moisture Erythema Location: Circumferential No Abnormalities Noted: No Temperature / Pain Temperature: No Abnormality Tenderness on Palpation: Yes Wound Preparation Ulcer Cleansing: Rinsed/Irrigated with Saline Topical Anesthetic Applied: Other: lidocaine 4%, Treatment Notes Wound #1 (Left, Medial Lower Leg) 1. Cleansed with: Clean wound with Normal Saline 2. Anesthetic Topical Lidocaine 4% cream to wound bed prior to debridement 4. Dressing Applied: Aquacel Ag Contact layer 5. Secondary Dressing Applied Bordered Foam Dressing Electronic Signature(s) Signed: 07/24/2017 5:06:38 PM By: Montey Hora Entered By: Montey Hora on 07/24/2017 13:52:53 [name] ([id number]) -------------------------------------------------------------------------------- Vitals Details [name]. Date of Service: 07/24/2017 1:30 PM Medical Record Patient Account Number: [id number] Number: Treating RN: Montey Hora 11/07/1925 (83 y.o. Other Clinician: Date of Birth/Sex: Female) Treating ROBSON, MICHAEL Primary Care Copeland Neisen: BABAOFF, MARCUS Kingstin Heims/Extender: G Referring Akshar Starnes: BABAOFF, MARCUS Weeks in Treatment: 6 Vital Signs Time Taken: 13:52 Temperature (F): 97.9 Height (in): 63 Pulse (bpm): 87 Weight (lbs): 99 Respiratory Rate (breaths/min): 18 Body Mass Index (BMI): 17.5 Blood Pressure (mmHg): 161/85 Reference Range: 80 - 120 mg /  dl Electronic Signature(s) Signed: 07/24/2017 5:06:38 PM By: Montey Hora Entered By: Montey Hora on 07/24/2017 13:53:18

## 2017-07-26 NOTE — Progress Notes (Signed)
[name], [name] ([id number]) Visit Report for 07/24/2017 Debridement Details [name], [name]. Date of Service: 07/24/2017 1:30 PM Medical Record Patient Account Number: [id number] Number: Treating RN: Montey Hora 1926-09-28 (83 y.o. Other Clinician: Date of Birth/Sex: Female) Treating Sanjuana Mruk Primary Care Provider: BABAOFF, MARCUS Provider/Extender: G Referring Provider: BABAOFF, MARCUS Weeks in Treatment: 6 Debridement Performed for Wound #1 Left,Medial Lower Leg Assessment: Performed By: Physician Ricard Dillon, MD Debridement: Debridement Pre-procedure Verification/Time Out Yes - 13:57 Taken: Start Time: 13:57 Pain Control: Lidocaine 4% Topical Solution Level: Skin/Subcutaneous Tissue Total Area Debrided (L x 1.9 (cm) x 0.7 (cm) = 1.33 (cm) W): Tissue and other Viable, Non-Viable, Fibrin/Slough, Subcutaneous material debrided: Instrument: Curette Bleeding: Minimum Hemostasis Achieved: Pressure End Time: 13:59 Procedural Pain: 0 Post Procedural Pain: 0 Response to Treatment: Procedure was tolerated well Post Debridement Measurements of Total Wound Length: (cm) 1.9 Width: (cm) 0.9 Depth: (cm) 0.1 Volume: (cm) 0.134 Character of Wound/Ulcer Post Improved Debridement: Post Procedure Diagnosis Same as Pre-procedure Electronic Signature(s) TRYPHENA, PERKOVICH ([id number]) Signed: 07/24/2017 5:06:38 PM By: Montey Hora Signed: 07/25/2017 8:40:51 AM By: Linton Ham MD Entered By: Linton Ham on 07/24/2017 15:31:46 Lavigne, [name] ([id number]) -------------------------------------------------------------------------------- HPI Details [name]. Date of Service: 07/24/2017 1:30 PM Medical Record Patient Account Number: [id number] Number: Treating RN: Montey Hora [birth year] (83 y.o. Other Clinician: Date of Birth/Sex: Female) Treating Katrice Goel Primary Care Provider:  BABAOFF, MARCUS Provider/Extender: G Referring Provider: BABAOFF, MARCUS Weeks in Treatment: 6 History of Present Illness HPI Description: 06/06/17 on evaluation today patient presents for evaluation concerning an ulcer which she initially had arise as a result of striking her left lower extremity money bed frame. With that being said this was roughly one month ago and unfortunately the wound despite two courses of Keflex have not really made a dramatic improvement. This has continued to drain as well as being exquisitely tender at times. Even to the point that she is having a difficult time walking. Patient does have chronic atrial fibrillation which subsequently leads to her being on long-term anticoagulant therapy and this causes ED bruising which may have contributed to this entry and where things are at this point. She does have valvular heart disease as well as hypertension and her last INR was 2.7. Currently Neosporin, Gauls, and an ace wrap has been utilized. Keflex is the antibiotic that she has been on. Patient has not had a culture up to this point and she states that her blood pressure at the primary care office yesterday was 138/72. Her pain is significant related to be an eight out of 10. No fevers, chills, nausea, or vomiting noted at this time. 06/20/17; patient was admitted here 2 weeks ago. She had a traumatic wound on her left lateral lower leg probably in the setting of some degree of venous insufficiency with surrounding cellulitis. She had been on Keflex, after we saw her she was put on doxycycline which she apparently did not tolerate. Culture that was done at the time showed Morganella which was resistant to Keflex. Apparently the patient continued to take Keflex after the appointment. She is also on Coumadin. I had tried to change her antibiotic when the Morganella culture was brought to my attention however apparently our staff could not reach the patient to inform them  of the antibiotic change 06/26/17; patient has wound on her left lateral lower extremity in the setting of some degree of venous insufficiency. I gave her cefdinir last  week and she is completing this. There is no evidence of surrounding infection. Aggressive debridement last week, wound bed looks healthier. We've been using Aquacel. They're traveling in the [place] next week we'll see her back in 2 weeks unless there are problems. 07/10/17; the patient is been using Aquacel Ag her husband is changing this with Kerlix and conform. She still complains of a lot of pain. Apparently with the antibiotics. We gave 2 or 3 weeks ago. Her INR went up to 6, this was managed by her primary physician 07/17/17; she is using Aquacel Ag and a border foam. Still discomfort although dimensions are better. 07/24/17; patient or for review of a trauma wound on her left anterior leg in the setting of some degree of chronic venous insufficiency she has been using Aquacel Ag and a border foam and making progress. Electronic Signature(s) Signed: 07/25/2017 8:40:51 AM By: Linton Ham MD Entered By: Linton Ham on 07/24/2017 15:32:40 [name] ([id number]) -------------------------------------------------------------------------------- Physical Exam Details [name], [name]. Date of Service: 07/24/2017 1:30 PM Medical Record Patient Account Number: [id number] Number: Treating RN: Montey Hora 1926/03/30 (83 y.o. Other Clinician: Date of Birth/Sex: Female) Treating Keiko Myricks Primary Care Provider: BABAOFF, MARCUS Provider/Extender: G Referring Provider: BABAOFF, MARCUS Weeks in Treatment: 6 Constitutional Patient is hypertensive.. Pulse regular and within target range for patient.Marland Kitchen Respirations regular, non-labored and within target range.. Temperature is normal and within the target range for the patient.Marland Kitchen appears in no distress. Eyes Conjunctivae clear. No  discharge. Notes Wound exam; there is no erythema around the wound no evidence of infection or ischemia. Necrotic surface on the lower aspect of the wound is debrided with a #3 curet. Post-debridement healthy granulation. Minimal bleeding controlled with direct pressure Electronic Signature(s) Signed: 07/25/2017 8:40:51 AM By: Linton Ham MD Entered By: Linton Ham on 07/24/2017 15:33:43 [name], [name] ([id number]) -------------------------------------------------------------------------------- Physician Orders Details [name] Date of Service: 07/24/2017 1:30 PM Medical Record Patient Account Number: [id number] Number: Treating RN: Montey Hora 05-Feb-1926 (83 y.o. Other Clinician: Date of Birth/Sex: Female) Treating Enrique Weiss Primary Care Provider: BABAOFF, MARCUS Provider/Extender: G Referring Provider: BABAOFF, MARCUS Weeks in Treatment: 6 Verbal / Phone Orders: No Diagnosis Coding Wound Cleansing Wound #1 Left,Medial Lower Leg o Cleanse wound with mild soap and water o May Shower, gently pat wound dry prior to applying new dressing. Anesthetic Wound #1 Left,Medial Lower Leg o Topical Lidocaine 4% cream applied to wound bed prior to debridement Primary Wound Dressing Wound #1 Left,Medial Lower Leg o Aquacel Ag o Other: - adaptic Secondary Dressing Wound #1 Left,Medial Lower Leg o Boardered Foam Dressing Dressing Change Frequency Wound #1 Left,Medial Lower Leg o Change dressing every other day. Follow-up Appointments Wound #1 Left,Medial Lower Leg o Return Appointment in 1 week. Edema Control Wound #1 Left,Medial Lower Leg o Elevate legs to the level of the heart and pump ankles as often as possible Additional Orders / Instructions Wound #1 Left,Medial Lower Leg o Increase protein intake. NASIYAH, LAVERDIERE ([id number]) o Other: - Check Coumadin levels on early next week. Electronic  Signature(s) Signed: 07/24/2017 5:06:38 PM By: Montey Hora Signed: 07/25/2017 8:40:51 AM By: Linton Ham MD Entered By: Montey Hora on 07/24/2017 14:01:47 [name] ([id number]) -------------------------------------------------------------------------------- Problem List Details [name], [name]. Date of Service: 07/24/2017 1:30 PM Medical Record Patient Account Number: [id number] Number: Treating RN: Montey Hora 09/19/1926 (83 y.o. 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 Date of Service: 07/24/2017 1:30 PM Medical Record Patient Account Number: [id number] Number: Treating RN: Montey Hora 1926/05/14 (83 y.o. Other Clinician: Date of Birth/Sex: Female) Treating Litha Lamartina Primary Care Provider: BABAOFF, MARCUS Provider/Extender: G Referring Provider: BABAOFF, MARCUS Weeks in Treatment: 6 Subjective History of Present Illness (HPI) 06/06/17 on evaluation today patient presents for evaluation concerning an ulcer which she initially had arise as a result of striking her left lower extremity money bed frame.  With that being said this was roughly one month ago and unfortunately the wound despite two courses of Keflex have not really made a dramatic improvement. This has continued to drain as well as being exquisitely tender at times. Even to the point that she is having a difficult time walking. Patient does have chronic atrial fibrillation which subsequently leads to her being on long-term anticoagulant therapy and this causes ED bruising which may have contributed to this entry and where things are at this point. She does have valvular heart disease as well as hypertension and her last INR was 2.7. Currently Neosporin, Gauls, and an ace wrap has been utilized. Keflex is the antibiotic that she has been on. Patient has not had a culture up to this point and she states that her blood pressure at the primary care office yesterday was 138/72. Her pain is significant related to be an eight out of 10. No fevers, chills, nausea, or vomiting noted at this time. 06/20/17; patient was admitted here 2 weeks ago. She had a traumatic wound on her left lateral lower leg probably in the setting of some degree of venous insufficiency with surrounding cellulitis. She had been on Keflex, after we saw her she was put on doxycycline which she apparently did not tolerate. Culture that was done at the time showed Morganella which was resistant to Keflex. Apparently the patient continued to take Keflex after the appointment. She is also on Coumadin. I had tried to change her antibiotic when the Morganella culture was brought to my attention however apparently our staff could not reach the patient to inform them of the antibiotic change 06/26/17; patient has wound on her left lateral lower extremity in the setting of some degree of venous insufficiency. I gave her cefdinir last week and she is completing this. There is no evidence of surrounding infection. Aggressive debridement last week, wound bed looks healthier. We've been  using Aquacel. They're traveling in the [place] next week we'll see her back in 2 weeks unless there are problems. 07/10/17; the patient is been using Aquacel Ag her husband is changing this with Kerlix and conform. She still complains of a lot of pain. Apparently with the antibiotics. We gave 2 or 3 weeks ago. Her INR went up to 6, this was managed by her primary physician 07/17/17; she is using Aquacel Ag and a border foam. Still discomfort although dimensions are better. 07/24/17; patient or for review of a trauma wound on  her left anterior leg in the setting of some degree of chronic venous insufficiency she has been using Aquacel Ag and a border foam and making progress. [name], [name] ([id number]) Objective Constitutional Patient is hypertensive.. Pulse regular and within target range for patient.Marland Kitchen Respirations regular, non-labored and within target range.. Temperature is normal and within the target range for the patient.Marland Kitchen appears in no distress. Vitals Time Taken: 1:52 PM, Height: 63 in, Weight: 99 lbs, BMI: 17.5, Temperature: 97.9 F, Pulse: 87 bpm, Respiratory Rate: 18 breaths/min, Blood Pressure: 161/85 mmHg. Eyes Conjunctivae clear. No discharge. General Notes: Wound exam; there is no erythema around the wound no evidence of infection or ischemia. Necrotic surface on the lower aspect of the wound is debrided with a #3 curet. Post-debridement healthy granulation. Minimal bleeding controlled with direct pressure Integumentary (Hair, Skin) Wound #1 status is Open. Original cause of wound was Trauma. The wound is located on the Left,Medial Lower Leg. The wound measures 1.9cm length x 0.7cm width x 0.1cm depth; 1.045cm^2 area and 0.104cm^3 volume. There is Fat Layer (Subcutaneous Tissue) Exposed exposed. There is no tunneling or undermining noted. There is a large amount of serosanguineous drainage noted. The wound margin is flat and intact. There is large (67-100%) red  granulation within the wound bed. There is a small (1-33%) amount of necrotic tissue within the wound bed including Adherent Slough. The periwound skin appearance exhibited: Excoriation, Ecchymosis, Erythema. The surrounding wound skin color is noted with erythema which is circumferential. Periwound temperature was noted as No Abnormality. The periwound has tenderness on palpation. Assessment Active Problems ICD-10 L03.116 - Cellulitis of left lower limb L97.822 - Non-pressure chronic ulcer of other part of left lower leg with fat layer exposed I48.0 - Paroxysmal atrial fibrillation Z79.01 - Long term (current) use of anticoagulants [name], [name] ([id number]) Procedures Wound #1 Pre-procedure diagnosis of Wound #1 is a Trauma, Other located on the Left,Medial Lower Leg . There was a Skin/Subcutaneous Tissue Debridement ([id number]) debridement with total area of 1.33 sq cm performed by Ricard Dillon, MD. with the following instrument(s): Curette to remove Viable and Non-Viable tissue/material including Fibrin/Slough and Subcutaneous after achieving pain control using Lidocaine 4% Topical Solution. A time out was conducted at 13:57, prior to the start of the procedure. A Minimum amount of bleeding was controlled with Pressure. The procedure was tolerated well with a pain level of 0 throughout and a pain level of 0 following the procedure. Post Debridement Measurements: 1.9cm length x 0.9cm width x 0.1cm depth; 0.134cm^3 volume. Character of Wound/Ulcer Post Debridement is improved. Post procedure Diagnosis Wound #1: Same as Pre-Procedure Plan Wound Cleansing: Wound #1 Left,Medial Lower Leg: Cleanse wound with mild soap and water May Shower, gently pat wound dry prior to applying new dressing. Anesthetic: Wound #1 Left,Medial Lower Leg: Topical Lidocaine 4% cream applied to wound bed prior to debridement Primary Wound Dressing: Wound #1 Left,Medial Lower Leg: Aquacel  Ag Other: - adaptic Secondary Dressing: Wound #1 Left,Medial Lower Leg: Boardered Foam Dressing Dressing Change Frequency: Wound #1 Left,Medial Lower Leg: Change dressing every other day. Follow-up Appointments: Wound #1 Left,Medial Lower Leg: Return Appointment in 1 week. Edema Control: Wound #1 Left,Medial Lower Leg: Elevate legs to the level of the heart and pump ankles as often as possible Additional Orders / Instructions: Wound #1 Left,Medial Lower Leg: Increase protein intake. Other: - Check Coumadin levels on early next week. [name], [name] ([id number]) #1 continue the same dressings #2 I am hopeful this will close  by next week, if not may need to change the primary dressings Electronic Signature(s) Signed: 07/25/2017 8:40:51 AM By: Linton Ham MD Entered By: Linton Ham on 07/24/2017 15:34:20 [name], [name] ([id number]) -------------------------------------------------------------------------------- SuperBill Details [name]. Date of Service: 07/24/2017 Medical Record Patient Account Number: [id number] Number: Treating RN: Montey Hora 1926-10-19 (83 y.o. Other Clinician: Date of Birth/Sex: Female) Treating Allea Kassner Primary Care Provider: BABAOFF, MARCUS Provider/Extender: G Referring Provider: BABAOFF, MARCUS Weeks in Treatment: 6 Diagnosis Coding ICD-10 Codes Code Description L03.116 Cellulitis of left lower limb L97.822 Non-pressure chronic ulcer of other part of left lower leg with fat layer exposed I48.0 Paroxysmal atrial fibrillation Z79.01 Long term (current) use of anticoagulants Facility Procedures CPT4: Description Modifier Quantity Code [id number] 11042 - DEB SUBQ TISSUE 20 SQ CM/< 1 ICD-10 Description Diagnosis L97.822 Non-pressure chronic ulcer of other part of left lower leg with fat layer exposed Physician Procedures CPT4: Description Modifier Quantity Code [id number] 42706 - WC PHYS SUBQ TISS 20  SQ CM 1 ICD-10 Description Diagnosis L97.822 Non-pressure chronic ulcer of other part of left lower leg with fat layer exposed Electronic Signature(s) Signed: 07/25/2017 8:40:51 AM By: Linton Ham MD Entered By: Linton Ham on 07/24/2017 15:34:41

## 2017-07-31 ENCOUNTER — Encounter: Payer: Medicare Other | Admitting: Internal Medicine

## 2017-07-31 DIAGNOSIS — L97822 Non-pressure chronic ulcer of other part of left lower leg with fat layer exposed: Secondary | ICD-10-CM | POA: Diagnosis not present

## 2017-08-01 NOTE — Progress Notes (Addendum)
AUNNA, SNOOKS ([id number]) Visit Report for 07/31/2017 Arrival Information Details [name], [name]. Date of Service: 07/31/2017 2:30 PM Medical Record Number: [id number] Patient Account Number: [id number] Date of Birth/Sex: [birth year] (83 y.o. Female) Treating RN: Montey Hora Primary Care Hamish Banks: BABAOFF, MARCUS Other Clinician: Referring Kylen Ismael: BABAOFF, MARCUS Treating Braysen Cloward/Extender: Tito Dine in Treatment: 7 Visit Information History Since Last Visit Added or deleted any medications: No Patient Arrived: Ambulatory Any new allergies or adverse reactions: No Arrival Time: 14:40 Had a fall or experienced change in No Accompanied By: spouse activities of daily living that may affect Transfer Assistance: None risk of falls: Patient Identification Verified: Yes Signs or symptoms of abuse/neglect since last visito No Secondary Verification Process Yes Hospitalized since last visit: No Completed: Has Dressing in Place as Prescribed: Yes Patient Requires Transmission-Based No Pain Present Now: No Precautions: Patient Has Alerts: Yes Patient Alerts: Patient on Blood Thinner Coumadin No ABI DT pain/location Electronic Signature(s) Signed: 07/31/2017 4:33:51 PM By: Montey Hora Entered By: Montey Hora on 07/31/2017 14:41:42 Taborn, [name] ([id number]) -------------------------------------------------------------------------------- Clinic Level of Care Assessment Details [name] Date of Service: 07/31/2017 2:30 PM Medical Record Number: [id number] Patient Account Number: [id number] Date of Birth/Sex: [birth year] (83 y.o. Female) Treating RN: Montey Hora Primary Care Jaymee Tilson: BABAOFF, MARCUS Other Clinician: Referring Virgil Slinger: BABAOFF, MARCUS Treating Hong Timm/Extender: Tito Dine in Treatment: 7 Clinic Level of Care Assessment Items TOOL 4 Quantity Score []  - Use when only an EandM is  performed on FOLLOW-UP visit 0 ASSESSMENTS - Nursing Assessment / Reassessment X - Reassessment of Co-morbidities (includes updates in patient status) 1 10 X- 1 5 Reassessment of Adherence to Treatment Plan ASSESSMENTS - Wound and Skin Assessment / Reassessment X - Simple Wound Assessment / Reassessment - one wound 1 5 []  - 0 Complex Wound Assessment / Reassessment - multiple wounds []  - 0 Dermatologic / Skin Assessment (not related to wound area) ASSESSMENTS - Focused Assessment []  - Circumferential Edema Measurements - multi extremities 0 []  - 0 Nutritional Assessment / Counseling / Intervention X- 1 5 Lower Extremity Assessment (monofilament, tuning fork, pulses) []  - 0 Peripheral Arterial Disease Assessment (using hand held doppler) ASSESSMENTS - Ostomy and/or Continence Assessment and Care []  - Incontinence Assessment and Management 0 []  - 0 Ostomy Care Assessment and Management (repouching, etc.) PROCESS - Coordination of Care X - Simple Patient / Family Education for ongoing care 1 15 []  - 0 Complex (extensive) Patient / Family Education for ongoing care []  - 0 Staff obtains Programmer, systems, Records, Test Results / Process Orders []  - 0 Staff telephones HHA, Nursing Homes / Clarify orders / etc []  - 0 Routine Transfer to another Facility (non-emergent condition) []  - 0 Routine Hospital Admission (non-emergent condition) []  - 0 New Admissions / Biomedical engineer / Ordering NPWT, Apligraf, etc. []  - 0 Emergency Hospital Admission (emergent condition) X- 1 10 Simple Discharge Coordination [name], [name]. ([id number]) []  - 0 Complex (extensive) Discharge Coordination PROCESS - Special Needs []  - Pediatric / Minor Patient Management 0 []  - 0 Isolation Patient Management []  - 0 Hearing / Language / Visual special needs []  - 0 Assessment of Community assistance (transportation, D/C planning, etc.) []  - 0 Additional assistance / Altered mentation []  - 0 Support  Surface(s) Assessment (bed, cushion, seat, etc.) INTERVENTIONS - Wound Cleansing / Measurement X - Simple Wound Cleansing - one wound 1 5 []  - 0 Complex Wound Cleansing - multiple wounds X- 1 5 Wound  Imaging (photographs - any number of wounds) []  - 0 Wound Tracing (instead of photographs) X- 1 5 Simple Wound Measurement - one wound []  - 0 Complex Wound Measurement - multiple wounds INTERVENTIONS - Wound Dressings X - Small Wound Dressing one or multiple wounds 1 10 []  - 0 Medium Wound Dressing one or multiple wounds []  - 0 Large Wound Dressing one or multiple wounds []  - 0 Application of Medications - topical []  - 0 Application of Medications - injection INTERVENTIONS - Miscellaneous []  - External ear exam 0 []  - 0 Specimen Collection (cultures, biopsies, blood, body fluids, etc.) []  - 0 Specimen(s) / Culture(s) sent or taken to Lab for analysis []  - 0 Patient Transfer (multiple staff / Civil Service fast streamer / Similar devices) []  - 0 Simple Staple / Suture removal (25 or less) []  - 0 Complex Staple / Suture removal (26 or more) []  - 0 Hypo / Hyperglycemic Management (close monitor of Blood Glucose) []  - 0 Ankle / Brachial Index (ABI) - do not check if billed separately X- 1 5 Vital Signs Frederic, Shyla C. ([id number]) Has the patient been seen at the hospital within the last three years: Yes Total Score: 80 Level Of Care: New/Established - Level 3 Electronic Signature(s) Signed: 07/31/2017 4:33:51 PM By: Montey Hora Entered By: Montey Hora on 07/31/2017 15:51:57 [name], [name] ([id number]) -------------------------------------------------------------------------------- Encounter Discharge Information Details [name]. Date of Service: 07/31/2017 2:30 PM Medical Record Number: [id number] Patient Account Number: [id number] Date of Birth/Sex: [birth year] (83 y.o. Female) Treating RN: Montey Hora Primary Care Ellyse Rotolo: BABAOFF, MARCUS Other  Clinician: Referring Jaedon Siler: BABAOFF, MARCUS Treating Keola Heninger/Extender: Tito Dine in Treatment: 7 Encounter Discharge Information Items Discharge Pain Level: 0 Discharge Condition: Stable Ambulatory Status: Ambulatory Discharge Destination: Home Transportation: Private Auto Accompanied By: spouse Schedule Follow-up Appointment: Yes Medication Reconciliation completed and No provided to Patient/Care V[place]: Provided on Clinical Summary of Care: 07/31/2017 Form Type Recipient Paper Patient MS Electronic Signature(s) Signed: 07/31/2017 4:33:51 PM By: Montey Hora Entered By: Montey Hora on 07/31/2017 15:53:08 [name], [name] ([id number]) -------------------------------------------------------------------------------- Lower Extremity Assessment Details [name]. Date of Service: 07/31/2017 2:30 PM Medical Record Number: [id number] Patient Account Number: [id number] Date of Birth/Sex: [birth year] (83 y.o. Female) Treating RN: Montey Hora Primary Care Ryan Palermo: BABAOFF, MARCUS Other Clinician: Referring Tyreanna Bisesi: BABAOFF, MARCUS Treating Navayah Sok/Extender: Ricard Dillon Weeks in Treatment: 7 Vascular Assessment Pulses: Dorsalis Pedis Palpable: [Left:Yes] Posterior Tibial Extremity colors, hair growth, and conditions: Extremity Color: [Left:Hyperpigmented] Hair Growth on Extremity: [Left:No] Temperature of Extremity: [Left:Warm] Capillary Refill: [Left:< 3 seconds] Electronic Signature(s) Signed: 07/31/2017 4:33:51 PM By: Montey Hora Entered By: Montey Hora on 07/31/2017 14:46:44 [name], [name] ([id number]) -------------------------------------------------------------------------------- Multi Wound Chart Details [name]. Date of Service: 07/31/2017 2:30 PM Medical Record Number: [id number] Patient Account Number: [id number] Date of Birth/Sex: 1926-07-27 (83 y.o. Female) Treating RN: Montey Hora Primary Care Irena Gaydos: BABAOFF, MARCUS Other Clinician: Referring Talley Casco: BABAOFF, MARCUS Treating Porschia Willbanks/Extender: Ricard Dillon Weeks in Treatment: 7 Vital Signs Height(in): 63 Pulse(bpm): 80 Weight(lbs): 99 Blood Pressure(mmHg): 150/85 Body Mass Index(BMI): 18 Temperature(F): 98.3 Respiratory Rate 18 (breaths/min): Photos: [N/A:N/A] Wound Location: Left, Medial Lower Leg N/A N/A Wounding Event: Trauma N/A N/A Primary Etiology: Trauma, Other N/A N/A Comorbid History: Cataracts, Arrhythmia, N/A N/A Hypertension, Osteoarthritis, Dementia Date Acquired: 05/06/2017 N/A N/A Weeks of Treatment: 7 N/A N/A Wound Status: Open N/A N/A Measurements L x W x D 0.5x0.4x0.1 N/A N/A (cm) Area (cm) :  0.157 N/A N/A Volume (cm) : 0.016 N/A N/A % Reduction in Area: 88.90% N/A N/A % Reduction in Volume: 88.70% N/A N/A Classification: Full Thickness Without N/A N/A Exposed Support Structures Exudate Amount: Large N/A N/A Exudate Type: Serosanguineous N/A N/A Exudate Color: red, brown N/A N/A Wound Margin: Flat and Intact N/A N/A Granulation Amount: Large (67-100%) N/A N/A Granulation Quality: Red N/A N/A Necrotic Amount: Small (1-33%) N/A N/A Exposed Structures: Fat Layer (Subcutaneous N/A N/A Tissue) Exposed: Yes Fascia: No Tendon: No Muscle: No [name], [name] ([id number]) Joint: No Bone: No Epithelialization: None N/A N/A Periwound Skin Texture: Excoriation: Yes N/A N/A Periwound Skin Moisture: No Abnormalities Noted N/A N/A Periwound Skin Color: Ecchymosis: Yes N/A N/A Erythema: Yes Erythema Location: Circumferential N/A N/A Temperature: No Abnormality N/A N/A Tenderness on Palpation: Yes N/A N/A Wound Preparation: Ulcer Cleansing: N/A N/A Rinsed/Irrigated with Saline Topical Anesthetic Applied: Other: lidocaine 4% Treatment Notes Wound #1 (Left, Medial Lower Leg) 1. Cleansed with: Clean wound with Normal Saline 2. Anesthetic Topical Lidocaine  4% cream to wound bed prior to debridement 4. Dressing Applied: Aquacel Ag Contact layer 5. Secondary Dressing Applied Bordered Foam Dressing Electronic Signature(s) Signed: 07/31/2017 4:33:30 PM By: Linton Ham MD Entered By: Linton Ham on 07/31/2017 16:03:57 [name] ([id number]) -------------------------------------------------------------------------------- Multi-Disciplinary Care Plan Details [name], [name]. Date of Service: 07/31/2017 2:30 PM Medical Record Number: [id number] Patient Account Number: [id number] Date of Birth/Sex: [birth year] (83 y.o. Female) Treating RN: Montey Hora Primary Care Tristan Bramble: Derinda Late Other Clinician: Referring Mouna Yager: BABAOFF, MARCUS Treating Benuel Ly/Extender: Tito Dine in Treatment: 7 Active Inactive Electronic Signature(s) Signed: 09/19/2017 8:30:08 AM By: Gretta Cool, BSN, RN, [name], Kim RN, BSN Signed: 09/24/2017 12:38:17 PM By: Montey Hora Previous Signature: 07/31/2017 4:33:51 PM Version By: Montey Hora Entered By: Gretta Cool BSN, RN, [name], Kim on 09/05/2017 14:08:24 [name] ([id number]) -------------------------------------------------------------------------------- Pain Assessment Details [name], [name]. Date of Service: 07/31/2017 2:30 PM Medical Record Number: [id number] Patient Account Number: [id number] Date of Birth/Sex: 1926-06-16 (83 y.o. Female) Treating RN: Montey Hora Primary Care Niah Heinle: BABAOFF, MARCUS Other Clinician: Referring Adison Reifsteck: BABAOFF, MARCUS Treating Jamyla Ard/Extender: Ricard Dillon Weeks in Treatment: 7 Active Problems Location of Pain Severity and Description of Pain Patient Has Paino No Site Locations Pain Management and Medication Current Pain Management: Notes Topical or injectable lidocaine is offered to patient for acute pain when surgical debridement is performed. If needed, Patient is instructed to use over the  counter pain medication for the following 24-48 hours after debridement. Wound care MDs do not prescribed pain medications. Patient has chronic pain or uncontrolled pain. Patient has been instructed to make an appointment with their Primary Care Physician for pain management. Electronic Signature(s) Signed: 07/31/2017 4:33:51 PM By: Montey Hora Entered By: Montey Hora on 07/31/2017 14:41:51 [name] ([id number]) -------------------------------------------------------------------------------- Patient/Caregiver Education Details [name] Date of Service: 07/31/2017 2:30 PM Medical Record Number: [id number] Patient Account Number: [id number] Date of Birth/Gender: 07-Aug-1926 (83 y.o. Female) Treating RN: Montey Hora Primary Care Physician: BABAOFF, MARCUS Other Clinician: Referring Physician: BABAOFF, MARCUS Treating Physician/Extender: Tito Dine in Treatment: 7 Education Assessment Education Provided To: Patient and Caregiver Education Topics Provided Wound/Skin Impairment: Handouts: Other: wound care as ordered Methods: Demonstration, Explain/Verbal Responses: State content correctly Electronic Signature(s) Signed: 07/31/2017 4:33:51 PM By: Montey Hora Entered By: Montey Hora on 07/31/2017 15:53:29 [name], [name] ([id number]) -------------------------------------------------------------------------------- Wound Assessment Details Patient Name: Brittany Lolling C. Date of Service: 07/31/2017 2:30 PM  Medical Record Number: [id number] Patient Account Number: [id number] Date of Birth/Sex: 1925/12/23 (83 y.o. Female) Treating RN: Montey Hora Primary Care Wilmon Conover: BABAOFF, MARCUS Other Clinician: Referring Talib Headley: BABAOFF, MARCUS Treating Jazzlin Clements/Extender: Ricard Dillon Weeks in Treatment: 7 Wound Status Wound Number: 1 Primary Trauma, Other Etiology: Wound Location: Left, Medial Lower Leg Wound Status:  Open Wounding Event: Trauma Comorbid Cataracts, Arrhythmia, Hypertension, Date Acquired: 05/06/2017 History: Osteoarthritis, Dementia Weeks Of Treatment: 7 Clustered Wound: No Photos Photo Uploaded By: Montey Hora on 07/31/2017 16:01:42 Wound Measurements Length: (cm) 0.5 Width: (cm) 0.4 Depth: (cm) 0.1 Area: (cm) 0.157 Volume: (cm) 0.016 % Reduction in Area: 88.9% % Reduction in Volume: 88.7% Epithelialization: None Tunneling: No Undermining: No Wound Description Full Thickness Without Exposed Support Foul O Classification: Structures Slough Wound Margin: Flat and Intact Exudate Large Amount: Exudate Type: Serosanguineous Exudate Color: red, brown dor After Cleansing: No /Fibrino No Wound Bed Granulation Amount: Large (67-100%) Exposed Structure Granulation Quality: Red Fascia Exposed: No Necrotic Amount: Small (1-33%) Fat Layer (Subcutaneous Tissue) Exposed: Yes Necrotic Quality: Adherent Slough Tendon Exposed: No Muscle Exposed: No Joint Exposed: No Bone Exposed: No Dodgen, Ysabelle C. ([id number]) Periwound Skin Texture Texture Color No Abnormalities Noted: No No Abnormalities Noted: No Excoriation: Yes Ecchymosis: Yes Erythema: Yes Moisture Erythema Location: Circumferential No Abnormalities Noted: No Temperature / Pain Temperature: No Abnormality Tenderness on Palpation: Yes Wound Preparation Ulcer Cleansing: Rinsed/Irrigated with Saline Topical Anesthetic Applied: Other: lidocaine 4%, Electronic Signature(s) Signed: 07/31/2017 4:33:51 PM By: Montey Hora Entered By: Montey Hora on 07/31/2017 14:56:22 [name] ([id number]) -------------------------------------------------------------------------------- Vitals Details [name]. Date of Service: 07/31/2017 2:30 PM Medical Record Number: [id number] Patient Account Number: [id number] Date of Birth/Sex: 1926-03-30 (83 y.o. Female) Treating RN: Montey Hora Primary Care Nieshia Larmon: BABAOFF, MARCUS Other Clinician: Referring Hindy Perrault: BABAOFF, MARCUS Treating Corney Knighton/Extender: Ricard Dillon Weeks in Treatment: 7 Vital Signs Time Taken: 14:41 Temperature (F): 98.3 Height (in): 63 Pulse (bpm): 80 Weight (lbs): 99 Respiratory Rate (breaths/min): 18 Body Mass Index (BMI): 17.5 Blood Pressure (mmHg): 150/85 Reference Range: 80 - 120 mg / dl Electronic Signature(s) Signed: 07/31/2017 4:33:51 PM By: Montey Hora Entered By: Montey Hora on 07/31/2017 14:42:09

## 2017-08-01 NOTE — Progress Notes (Signed)
[name], [name] ([id number]) Visit Report for 07/31/2017 HPI Details [name], [name]. Date of Service: 07/31/2017 2:30 PM Medical Record Patient Account Number: [id number] Number: Treating RN: [name] 12/04/1925 (83 y.o. Other Clinician: Date of Birth/Sex: Female) Treating [name] Primary Care Provider: BABAOFF, Brittany Provider/Extender: [name] Referring Provider: BABAOFF, Brittany Weeks in Treatment: 7 History of Present Illness HPI Description: 06/06/17 on evaluation today patient presents for evaluation concerning an ulcer which she initially had arise as a result of striking her left lower extremity money bed frame. With that being said this was roughly one month ago and unfortunately the wound despite two courses of Keflex have not really made a dramatic improvement. This has continued to drain as well as being exquisitely tender at times. Even to the point that she is having a difficult time walking. Patient does have chronic atrial fibrillation which subsequently leads to her being on long-term anticoagulant therapy and this causes ED bruising which may have contributed to this entry and where things are at this point. She does have valvular heart disease as well as hypertension and her last INR was 2.7. Currently Neosporin, Gauls, and an ace wrap has been utilized. Keflex is the antibiotic that she has been on. Patient has not had a culture up to this point and she states that her blood pressure at the primary care office yesterday was 138/72. Her pain is significant related to be an eight out of 10. No fevers, chills, nausea, or vomiting noted at this time. 06/20/17; patient was admitted here 2 weeks ago. She had a traumatic wound on her left lateral lower leg probably in the setting of some degree of venous insufficiency with surrounding cellulitis. She had been on Keflex, after we saw her she was put on doxycycline which she apparently did not  tolerate. Culture that was done at the time showed Morganella which was resistant to Keflex. Apparently the patient continued to take Keflex after the appointment. She is also on Coumadin. I had tried to change her antibiotic when the Morganella culture was brought to my attention however apparently our staff could not reach the patient to inform them of the antibiotic change 06/26/17; patient has wound on her left lateral lower extremity in the setting of some degree of venous insufficiency. I gave her cefdinir last week and she is completing this. There is no evidence of surrounding infection. Aggressive debridement last week, wound bed looks healthier. We've been using Aquacel. They're traveling in the [place] next week we'll see her back in 2 weeks unless there are problems. 07/10/17; the patient is been using Aquacel Ag her husband is changing this with Kerlix and conform. She still complains of a lot of pain. Apparently with the antibiotics. We gave 2 or 3 weeks ago. Her INR went up to 6, this was managed by her primary physician 07/17/17; she is using Aquacel Ag and a border foam. Still discomfort although dimensions are better. 07/24/17; patient or for review of a trauma wound on her left anterior leg in the setting of some degree of chronic venous insufficiency she has been using Aquacel Ag and a border foam and making progress. 07/31/17; only a small open area of this original significant trauma is still open. Her husband is changing this every second day [Aquacel Ag] Electronic Signature(s) Signed: 07/31/2017 4:33:30 PM By: Brittany Ham MD [name], [name] ([id number]) Entered By: [name] on 07/31/2017 16:04:49 [name] ([id number]) -------------------------------------------------------------------------------- Physical Exam Details Patient Name:  [name], [name] Date of Service: 07/31/2017 2:30 PM Medical Record Patient Account Number:  [id number] Number: Treating RN: [name] Apr 15, 1926 (83 y.o. Other Clinician: Date of Birth/Sex: Female) Treating [name] Primary Care Provider: BABAOFF, Brittany Provider/Extender: [name] Referring Provider: BABAOFF, Brittany Weeks in Treatment: 7 Constitutional Patient is hypertensive.. Pulse regular and within target range for patient.Marland Kitchen Respirations regular, non-labored and within target range.. Temperature is normal and within the target range for the patient.Marland Kitchen appears in no distress. Notes Would exam; there is no erythema around the wound. No infection or ischemia. Only a small open area on the superior aspect of this original traumatic wound still is open and even this is small. No debridement is necessary Electronic Signature(s) Signed: 07/31/2017 4:33:30 PM By: Brittany Ham MD Entered By: [name] on 07/31/2017 16:06:34 [name] ([id number]) -------------------------------------------------------------------------------- Physician Orders Details [name] Date of Service: 07/31/2017 2:30 PM Medical Record Patient Account Number: [id number] Number: Treating RN: [name] [birth year] (83 y.o. Other Clinician: Date of Birth/Sex: Female) Treating [name] Primary Care Provider: BABAOFF, Brittany Provider/Extender: [name] Referring Provider: BABAOFF, Brittany Weeks in Treatment: 7 Verbal / Phone Orders: No Diagnosis Coding Wound Cleansing Wound #1 Left,Medial Lower Leg o Cleanse wound with mild soap and water o May Shower, gently pat wound dry prior to applying new dressing. Anesthetic Wound #1 Left,Medial Lower Leg o Topical Lidocaine 4% cream applied to wound bed prior to debridement Primary Wound Dressing Wound #1 Left,Medial Lower Leg o Aquacel Ag o Other: - adaptic Secondary Dressing Wound #1 Left,Medial Lower Leg o Boardered Foam Dressing Dressing Change Frequency Wound #1  Left,Medial Lower Leg o Change dressing every other day. Follow-up Appointments Wound #1 Left,Medial Lower Leg o Return Appointment in 1 week. Edema Control Wound #1 Left,Medial Lower Leg o Elevate legs to the level of the heart and pump ankles as often as possible Additional Orders / Instructions Wound #1 Left,Medial Lower Leg o Increase protein intake. Brittany, [name] ([id number]) o Other: - Check Coumadin levels on early next week. Electronic Signature(s) Signed: 07/31/2017 4:33:30 PM By: Brittany Ham MD Signed: 07/31/2017 4:33:51 PM By: [name] Entered By: [name] on 07/31/2017 15:04:33 [name] ([id number]) -------------------------------------------------------------------------------- Problem List Details [name], [name]. Date of Service: 07/31/2017 2:30 PM Medical Record Patient Account Number: [id number] Number: Treating RN: [name] 1926/01/17 (83 y.o. 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 Date of Service: 07/31/2017 2:30 PM Medical Record Patient Account Number:  [id number] Number: Treating RN: [name] 12-Jun-1926 (83 y.o. Other Clinician: Date of Birth/Sex: Female) Treating Mattie Nordell Primary Care Provider: BABAOFF, Brittany Provider/Extender: [name] Referring Provider: BABAOFF, Brittany Weeks in Treatment: 7 Subjective History of Present Illness (HPI) 06/06/17 on evaluation today patient presents for evaluation concerning an ulcer which she initially had arise as a result of striking her left lower extremity money bed frame. With  that being said this was roughly one month ago and unfortunately the wound despite two courses of Keflex have not really made a dramatic improvement. This has continued to drain as well as being exquisitely tender at times. Even to the point that she is having a difficult time walking. Patient does have chronic atrial fibrillation which subsequently leads to her being on long-term anticoagulant therapy and this causes ED bruising which may have contributed to this entry and where things are at this point. She does have valvular heart disease as well as hypertension and her last INR was 2.7. Currently Neosporin, Gauls, and an ace wrap has been utilized. Keflex is the antibiotic that she has been on. Patient has not had a culture up to this point and she states that her blood pressure at the primary care office yesterday was 138/72. Her pain is significant related to be an eight out of 10. No fevers, chills, nausea, or vomiting noted at this time. 06/20/17; patient was admitted here 2 weeks ago. She had a traumatic wound on her left lateral lower leg probably in the setting of some degree of venous insufficiency with surrounding cellulitis. She had been on Keflex, after we saw her she was put on doxycycline which she apparently did not tolerate. Culture that was done at the time showed Morganella which was resistant to Keflex. Apparently the patient continued to take Keflex after the appointment. She is also on  Coumadin. I had tried to change her antibiotic when the Morganella culture was brought to my attention however apparently our staff could not reach the patient to inform them of the antibiotic change 06/26/17; patient has wound on her left lateral lower extremity in the setting of some degree of venous insufficiency. I gave her cefdinir last week and she is completing this. There is no evidence of surrounding infection. Aggressive debridement last week, wound bed looks healthier. We've been using Aquacel. They're traveling in the [place] next week we'll see her back in 2 weeks unless there are problems. 07/10/17; the patient is been using Aquacel Ag her husband is changing this with Kerlix and conform. She still complains of a lot of pain. Apparently with the antibiotics. We gave 2 or 3 weeks ago. Her INR went up to 6, this was managed by her primary physician 07/17/17; she is using Aquacel Ag and a border foam. Still discomfort although dimensions are better. 07/24/17; patient or for review of a trauma wound on her left anterior leg in the setting of some degree of chronic venous insufficiency she has been using Aquacel Ag and a border foam and making progress. 07/31/17; only a small open area of this original significant trauma is still open. Her husband is changing this every second day [Aquacel Ag] [name], [name] ([id number]) Objective Constitutional Patient is hypertensive.. Pulse regular and within target range for patient.Marland Kitchen Respirations regular, non-labored and within target range.. Temperature is normal and within the target range for the patient.Marland Kitchen appears in no distress. Vitals Time Taken: 2:41 PM, Height: 63 in, Weight: 99 lbs, BMI: 17.5, Temperature: 98.3 F, Pulse: 80 bpm, Respiratory Rate: 18 breaths/min, Blood Pressure: 150/85 mmHg. General Notes: Would exam; there is no erythema around the wound. No infection or ischemia. Only a small open area on the superior aspect of this  original traumatic wound still is open and even this is small. No debridement is necessary Integumentary (Hair, Skin) Wound #1 status is Open. Original cause of wound was Trauma. The wound is located  on the Left,Medial Lower Leg. The wound measures 0.5cm length x 0.4cm width x 0.1cm depth; 0.157cm^2 area and 0.016cm^3 volume. There is Fat Layer (Subcutaneous Tissue) Exposed exposed. There is no tunneling or undermining noted. There is a large amount of serosanguineous drainage noted. The wound margin is flat and intact. There is large (67-100%) red granulation within the wound bed. There is a small (1-33%) amount of necrotic tissue within the wound bed including Adherent Slough. The periwound skin appearance exhibited: Excoriation, Ecchymosis, Erythema. The surrounding wound skin color is noted with erythema which is circumferential. Periwound temperature was noted as No Abnormality. The periwound has tenderness on palpation. Assessment Active Problems ICD-10 L03.116 - Cellulitis of left lower limb L97.822 - Non-pressure chronic ulcer of other part of left lower leg with fat layer exposed I48.0 - Paroxysmal atrial fibrillation Z79.01 - Long term (current) use of anticoagulants Plan [name], [name] ([id number]) Wound Cleansing: Wound #1 Left,Medial Lower Leg: Cleanse wound with mild soap and water May Shower, gently pat wound dry prior to applying new dressing. Anesthetic: Wound #1 Left,Medial Lower Leg: Topical Lidocaine 4% cream applied to wound bed prior to debridement Primary Wound Dressing: Wound #1 Left,Medial Lower Leg: Aquacel Ag Other: - adaptic Secondary Dressing: Wound #1 Left,Medial Lower Leg: Boardered Foam Dressing Dressing Change Frequency: Wound #1 Left,Medial Lower Leg: Change dressing every other day. Follow-up Appointments: Wound #1 Left,Medial Lower Leg: Return Appointment in 1 week. Edema Control: Wound #1 Left,Medial Lower Leg: Elevate legs to the  level of the heart and pump ankles as often as possible Additional Orders / Instructions: Wound #1 Left,Medial Lower Leg: Increase protein intake. Other: - Check Coumadin levels on early next week. #1 Aquacel Ag border foam change every second day #2 [place]3 she has an appointment for 2 weeks although this may be healed. If so they can call us and let us know without bringing her to the clinic Electronic Signature(s) Signed: 07/31/2017 4:33:30 PM By: Brittany Ham MD Entered By: [name] on 07/31/2017 16:07:28 [name] ([id number]) -------------------------------------------------------------------------------- SuperBill Details [name]. Date of Service: 07/31/2017 Medical Record Patient Account Number: [id number] Number: Treating RN: [name] [birth year] (83 y.o. Other Clinician: Date of Birth/Sex: Female) Treating Erline Siddoway Primary Care Provider: BABAOFF, Brittany Provider/Extender: [name] Referring Provider: BABAOFF, Brittany Weeks in Treatment: 7 Diagnosis Coding ICD-10 Codes Code Description L03.116 Cellulitis of left lower limb L97.822 Non-pressure chronic ulcer of other part of left lower leg with fat layer exposed I48.0 Paroxysmal atrial fibrillation Z79.01 Long term (current) use of anticoagulants Facility Procedures CPT4 Code: 70488891 Description: 99213 - WOUND CARE VISIT-LEV 3 EST PT Modifier: Quantity: 1 Physician Procedures CPT4: Description Modifier Quantity Code [id number] 88280 - WC PHYS LEVEL 2 - EST PT 1 ICD-10 Description Diagnosis L97.822 Non-pressure chronic ulcer of other part of left lower leg with fat layer exposed Electronic Signature(s) Signed: 07/31/2017 4:33:30 PM By: Brittany Ham MD Entered By: [name] on 07/31/2017 16:07:51

## 2017-08-21 ENCOUNTER — Ambulatory Visit: Payer: Medicare Other | Admitting: Internal Medicine

## 2019-01-14 ENCOUNTER — Other Ambulatory Visit: Payer: Self-pay

## 2019-01-14 ENCOUNTER — Encounter: Payer: Medicare Other | Attending: Physician Assistant | Admitting: Physician Assistant

## 2019-01-14 DIAGNOSIS — I482 Chronic atrial fibrillation, unspecified: Secondary | ICD-10-CM | POA: Diagnosis not present

## 2019-01-14 DIAGNOSIS — Z809 Family history of malignant neoplasm, unspecified: Secondary | ICD-10-CM | POA: Insufficient documentation

## 2019-01-14 DIAGNOSIS — Z881 Allergy status to other antibiotic agents status: Secondary | ICD-10-CM | POA: Insufficient documentation

## 2019-01-14 DIAGNOSIS — I10 Essential (primary) hypertension: Secondary | ICD-10-CM | POA: Diagnosis not present

## 2019-01-14 DIAGNOSIS — Z87891 Personal history of nicotine dependence: Secondary | ICD-10-CM | POA: Diagnosis not present

## 2019-01-14 DIAGNOSIS — Z823 Family history of stroke: Secondary | ICD-10-CM | POA: Insufficient documentation

## 2019-01-14 DIAGNOSIS — Z7901 Long term (current) use of anticoagulants: Secondary | ICD-10-CM | POA: Diagnosis not present

## 2019-01-14 DIAGNOSIS — L97822 Non-pressure chronic ulcer of other part of left lower leg with fat layer exposed: Secondary | ICD-10-CM | POA: Diagnosis present

## 2019-01-14 DIAGNOSIS — M1990 Unspecified osteoarthritis, unspecified site: Secondary | ICD-10-CM | POA: Diagnosis not present

## 2019-01-14 DIAGNOSIS — I480 Paroxysmal atrial fibrillation: Secondary | ICD-10-CM | POA: Insufficient documentation

## 2019-01-14 DIAGNOSIS — I872 Venous insufficiency (chronic) (peripheral): Secondary | ICD-10-CM | POA: Insufficient documentation

## 2019-01-14 DIAGNOSIS — F0390 Unspecified dementia without behavioral disturbance: Secondary | ICD-10-CM | POA: Insufficient documentation

## 2019-01-14 DIAGNOSIS — Z8249 Family history of ischemic heart disease and other diseases of the circulatory system: Secondary | ICD-10-CM | POA: Insufficient documentation

## 2019-01-14 DIAGNOSIS — E11622 Type 2 diabetes mellitus with other skin ulcer: Secondary | ICD-10-CM | POA: Diagnosis not present

## 2019-01-14 NOTE — Progress Notes (Signed)
MEA, [name] ([id number]) Visit Report for 01/14/2019 Abuse/Suicide Risk Screen Details [name], [name]. Date of Service: 01/14/2019 10:15 AM Medical Record Number: [id number] Patient Account Number: [id number] Date of Birth/Sex: 1926/05/31 (85 y.o. F) Treating RN: Army Melia Primary Care Anthoni Geerts: Derinda Late Other Clinician: Referring Ranelle Auker: Referral, Self Treating Dionisia Pacholski/Extender: STONE III, HOYT Weeks in Treatment: 0 Abuse/Suicide Risk Screen Items Answer ABUSE/SUICIDE RISK SCREEN: Has anyone close to you tried to hurt or harm you recentlyo No Do you feel uncomfortable with anyone in your familyo No Has anyone forced you do things that you didnot want to doo No Do you have any thoughts of harming yourselfo No Patient displays signs or symptoms of abuse and/or neglect. No Electronic Signature(s) Signed: 01/14/2019 4:18:28 PM By: Army Melia Entered By: Army Melia on 01/14/2019 10:42:17 [name] ([id number]) -------------------------------------------------------------------------------- Activities of Daily Living Details [name], [name]. Date of Service: 01/14/2019 10:15 AM Medical Record Number: [id number] Patient Account Number: [id number] Date of Birth/Sex: 1925-12-01 (85 y.o. F) Treating RN: Army Melia Primary Care Xayvier Vallez: Derinda Late Other Clinician: Referring Alvah Gilder: Referral, Self Treating Benay Pomeroy/Extender: STONE III, HOYT Weeks in Treatment: 0 Activities of Daily Living Items Answer Activities of Daily Living (Please select one for each item) Drive Automobile Completely Able Take Medications Completely Able Use Telephone Completely Able Care for Appearance Completely Able Use Toilet Completely Able Bath / Shower Completely Able Dress Self Completely Able Feed Self Completely Able Walk Completely Able Get In / Out Bed Completely Able Housework Need Assistance Prepare Meals Completely Able Handle  Money Completely Able Shop for Self Completely Able Electronic Signature(s) Signed: 01/14/2019 4:18:28 PM By: Army Melia Entered By: Army Melia on 01/14/2019 10:42:42 Donate, [name] ([id number]) -------------------------------------------------------------------------------- Education Screening Details [name]. Date of Service: 01/14/2019 10:15 AM Medical Record Number: [id number] Patient Account Number: [id number] Date of Birth/Sex: 09/22/1926 (85 y.o. F) Treating RN: Army Melia Primary Care Yomar Mejorado: Derinda Late Other Clinician: Referring Alverta Caccamo: Referral, Self Treating Roshanna Cimino/Extender: Melburn Hake, HOYT Weeks in Treatment: 0 Primary Learner Assessed: Patient Learning Preferences/Education Level/Primary Language Learning Preference: Explanation, Demonstration Highest Education Level: College or Above Preferred Language: English Cognitive Barrier Language Barrier: No Translator Needed: No Memory Deficit: No Emotional Barrier: No Cultural/Religious Beliefs Affecting Medical Care: No Physical Barrier Impaired Vision: No Impaired Hearing: No Decreased Hand dexterity: No Knowledge/Comprehension Knowledge Level: High Comprehension Level: High Ability to understand written High instructions: Ability to understand verbal High instructions: Motivation Anxiety Level: Calm Cooperation: Cooperative Education Importance: Acknowledges Need Interest in Health Problems: Asks Questions Perception: Coherent Willingness to Engage in Self- High Management Activities: Readiness to Engage in Self- High Management Activities: Electronic Signature(s) Signed: 01/14/2019 4:18:28 PM By: Army Melia Entered By: Army Melia on 01/14/2019 10:43:08 [name] ([id number]) -------------------------------------------------------------------------------- Fall Risk Assessment Details [name]. Date of Service: 01/14/2019  10:15 AM Medical Record Number: [id number] Patient Account Number: [id number] Date of Birth/Sex: Mar 17, 1926 (85 y.o. F) Treating RN: Army Melia Primary Care Amie Cowens: Derinda Late Other Clinician: Referring Aletheia Tangredi: Referral, Self Treating Lleyton Byers/Extender: STONE III, HOYT Weeks in Treatment: 0 Fall Risk Assessment Items Have you had 2 or more falls in the last 12 monthso 0 No Have you had any fall that resulted in injury in the last 12 monthso 0 No FALL RISK ASSESSMENT: History of falling - immediate or within 3 months 0 No Secondary diagnosis 0 No Ambulatory aid None/bed rest/wheelchair/nurse 0 No Crutches/cane/walker 0 No Furniture 0 No IV Access/Saline  Lock 0 No Gait/Training Normal/bed rest/immobile 0 No Weak 0 No Impaired 0 No Mental Status Oriented to own ability 0 No Electronic Signature(s) Signed: 01/14/2019 4:18:28 PM By: Army Melia Entered By: Army Melia on 01/14/2019 10:43:28 Dillion, [name] ([id number]) -------------------------------------------------------------------------------- Foot Assessment Details [name]. Date of Service: 01/14/2019 10:15 AM Medical Record Number: [id number] Patient Account Number: [id number] Date of Birth/Sex: [birth year] (85 y.o. F) Treating RN: Army Melia Primary Care Coston Mandato: Derinda Late Other Clinician: Referring Palma Buster: Referral, Self Treating Aaria Happ/Extender: STONE III, HOYT Weeks in Treatment: 0 Foot Assessment Items Site Locations + = Sensation present, - = Sensation absent, C = Callus, U = Ulcer R = Redness, W = Warmth, M = Maceration, PU = Pre-ulcerative lesion F = Fissure, S = Swelling, D = Dryness Assessment Right: Left: Other Deformity: No No Prior Foot Ulcer: No No Prior Amputation: No No Charcot Joint: No No Ambulatory Status: Ambulatory Without Help Gait: Steady Electronic Signature(s) Signed: 01/14/2019 4:18:28 PM By: Army Melia Entered By: Army Melia on  01/14/2019 10:45:06 Watt, [name] ([id number]) -------------------------------------------------------------------------------- Nutrition Risk Screening Details [name]. Date of Service: 01/14/2019 10:15 AM Medical Record Number: [id number] Patient Account Number: [id number] Date of Birth/Sex: 11-Feb-1926 (85 y.o. F) Treating RN: Army Melia Primary Care Jannine Abreu: Derinda Late Other Clinician: Referring Richetta Cubillos: Referral, Self Treating Quadarius Henton/Extender: STONE III, HOYT Weeks in Treatment: 0 Height (in): 64 Weight (lbs): 120 Body Mass Index (BMI): 20.6 Nutrition Risk Screening Items Score Screening NUTRITION RISK SCREEN: I have an illness or condition that made me change the kind and/or amount of 0 No food I eat I eat fewer than two meals per day 0 No I eat few fruits and vegetables, or milk products 0 No I have three or more drinks of beer, liquor or wine almost every day 0 No I have tooth or mouth problems that make it hard for me to eat 0 No I don't always have enough money to buy the food I need 0 No I eat alone most of the time 0 No I take three or more different prescribed or over-the-counter drugs a day 0 No Without wanting to, I have lost or gained 10 pounds in the last six months 0 No I am not always physically able to shop, cook and/or feed myself 0 No Nutrition Protocols Good Risk Protocol Moderate Risk Protocol High Risk Proctocol Risk Level: Good Risk Score: 0 Electronic Signature(s) Signed: 01/14/2019 4:18:28 PM By: Army Melia Entered By: Army Melia on 01/14/2019 10:43:38

## 2019-01-16 NOTE — Progress Notes (Signed)
Brittany, LORENZEN ([id number]) Visit Report for 01/14/2019 Chief Complaint Document Details [name], [name]. Date of Service: 01/14/2019 10:15 AM Medical Record Number: [id number] Patient Account Number: [id number] Date of Birth/Sex: 1925-12-07 (85 y.o. F) Treating RN: Montey Hora Primary Care Provider: Derinda Late Other Clinician: Referring Provider: Referral, Self Treating Provider/Extender: STONE III, HOYT Weeks in Treatment: 0 Information Obtained from: Patient Chief Complaint Left anterior shin ulcer Electronic Signature(s) Signed: 01/15/2019 11:01:46 PM By: Worthy Keeler PA-C Entered By: Worthy Keeler on 01/14/2019 11:07:47 Vivier, [name] ([id number]) -------------------------------------------------------------------------------- Debridement Details [name]. Date of Service: 01/14/2019 10:15 AM Medical Record Number: [id number] Patient Account Number: [id number] Date of Birth/Sex: 12-28-1925 (85 y.o. F) Treating RN: Montey Hora Primary Care Provider: Derinda Late Other Clinician: Referring Provider: Referral, Self Treating Provider/Extender: STONE III, HOYT Weeks in Treatment: 0 Debridement Performed for Wound #2 Left,Anterior Lower Leg Assessment: Performed By: Physician STONE III, HOYT E., PA-C Debridement Type: Debridement Level of Consciousness (Pre- Awake and Alert procedure): Pre-procedure Verification/Time Yes - 11:20 Out Taken: Start Time: 11:20 Pain Control: Lidocaine 4% Topical Solution Total Area Debrided (L x W): 2.4 (cm) x 2 (cm) = 4.8 (cm) Tissue and other material Non-Viable, Skin: Dermis , Skin: Epidermis debrided: Level: Skin/Epidermis Debridement Description: Selective/Open Wound Instrument: Forceps, Scissors Bleeding: None End Time: 11:21 Procedural Pain: 0 Post Procedural Pain: 0 Response to Treatment: Procedure was tolerated well Level of Consciousness Awake and  Alert (Post-procedure): Post Debridement Measurements of Total Wound Length: (cm) 2.4 Width: (cm) 2 Depth: (cm) 0.1 Volume: (cm) 0.377 Character of Wound/Ulcer Post Debridement: Improved Post Procedure Diagnosis Same as Pre-procedure Electronic Signature(s) Signed: 01/14/2019 5:06:46 PM By: Montey Hora Signed: 01/15/2019 11:01:46 PM By: Worthy Keeler PA-C Entered By: Montey Hora on 01/14/2019 11:22:44 Levandoski, [name] ([id number]) -------------------------------------------------------------------------------- HPI Details [name]. Date of Service: 01/14/2019 10:15 AM Medical Record Number: [id number] Patient Account Number: [id number] Date of Birth/Sex: 02-04-1926 (85 y.o. F) Treating RN: Montey Hora Primary Care Provider: Derinda Late Other Clinician: Referring Provider: Referral, Self Treating Provider/Extender: Melburn Hake, HOYT Weeks in Treatment: 0 History of Present Illness HPI Description: 06/06/17 on evaluation today patient presents for evaluation concerning an ulcer which she initially had arise as a result of striking her left lower extremity money bed frame. With that being said this was roughly one month ago and unfortunately the wound despite two courses of Keflex have not really made a dramatic improvement. This has continued to drain as well as being exquisitely tender at times. Even to the point that she is having a difficult time walking. Patient does have chronic atrial fibrillation which subsequently leads to her being on long-term anticoagulant therapy and this causes ED bruising which may have contributed to this entry and where things are at this point. She does have valvular heart disease as well as hypertension and her last INR was 2.7. Currently Neosporin, Gauls, and an ace wrap has been utilized. Keflex is the antibiotic that she has been on. Patient has not had a culture up to this point and she states that her blood  pressure at the primary care office yesterday was 138/72. Her pain is significant related to be an eight out of 10. No fevers, chills, nausea, or vomiting noted at this time. 06/20/17; patient was admitted here 2 weeks ago. She had a traumatic wound on her left lateral lower leg probably in the setting of some degree of venous insufficiency with surrounding cellulitis.  She had been on Keflex, after we saw her she was put on doxycycline which she apparently did not tolerate. Culture that was done at the time showed Morganella which was resistant to Keflex. Apparently the patient continued to take Keflex after the appointment. She is also on Coumadin. I had tried to change her antibiotic when the Morganella culture was brought to my attention however apparently our staff could not reach the patient to inform them of the antibiotic change 06/26/17; patient has wound on her left lateral lower extremity in the setting of some degree of venous insufficiency. I gave her cefdinir last week and she is completing this. There is no evidence of surrounding infection. Aggressive debridement last week, wound bed looks healthier. We've been using Aquacel. They're traveling in the [place] next week we'll see her back in 2 weeks unless there are problems. 07/10/17; the patient is been using Aquacel Ag her husband is changing this with Kerlix and conform. She still complains of a lot of pain. Apparently with the antibiotics. We gave 2 or 3 weeks ago. Her INR went up to 6, this was managed by her primary physician 07/17/17; she is using Aquacel Ag and a border foam. Still discomfort although dimensions are better. 07/24/17; patient or for review of a trauma wound on her left anterior leg in the setting of some degree of chronic venous insufficiency she has been using Aquacel Ag and a border foam and making progress. 07/31/17; only a small open area of this original significant trauma is still open. Her husband is  changing this every second day [Aquacel Ag] Readmission: 01/14/19 on evaluation today patient is that she seen for initial inspection during the office visit today concerning a trauma/skin tear to the left anterior lower extremity. This is roughly the same region where she was previously treated and years past when she was here in the clinic. Nonetheless upon evaluation today the patient's wound actually showed signs of decent granulation around the edge although unfortunately the skin flap that was torn back did fold under on itself and the flap itself is dying. She does have some eschar surrounded the edges of the wound at this point. Nonetheless this also was very tender for her. The patient does have a history of hypertension, atrial fibrillation, and long-term use anticoagulant therapy. Electronic Signature(s) Signed: 01/15/2019 11:01:46 PM By: Worthy Keeler PA-C Entered By: Worthy Keeler on 01/15/2019 22:19:30 [name] ([id number]) -------------------------------------------------------------------------------- Physical Exam Details Patient Name: CARAMIA, BOUTIN. Date of Service: 01/14/2019 10:15 AM Medical Record Number: [id number] Patient Account Number: [id number] Date of Birth/Sex: 18-Feb-1926 (85 y.o. F) Treating RN: Montey Hora Primary Care Provider: Derinda Late Other Clinician: Referring Provider: Referral, Self Treating Provider/Extender: STONE III, HOYT Weeks in Treatment: 0 Constitutional patient is hypertensive.. pulse regular and within target range for patient.Marland Kitchen respirations regular, non-labored and within target range for patient.Marland Kitchen temperature within target range for patient.. Well-nourished and well-hydrated in no acute distress. Eyes conjunctiva clear no eyelid edema noted. pupils equal round and reactive to light and accommodation. Ears, Nose, Mouth, and Throat no gross abnormality of ear auricles or external auditory canals. normal hearing noted  during conversation. mucus membranes moist. Respiratory normal breathing without difficulty. clear to auscultation bilaterally. Cardiovascular regular rate and rhythm with normal S1, S2. no clubbing, cyanosis, significant edema, <3 sec cap refill. Gastrointestinal (GI) soft, non-tender, non-distended, +BS. no ventral hernia noted. Musculoskeletal Patient unable to walk without assistance. no significant deformity or arthritic changes, no loss  or range of motion, no clubbing. Psychiatric this patient is able to make decisions and demonstrates good insight into disease process. Alert and Oriented x 3. pleasant and cooperative. Notes Upon inspection today patient's wound that she doesn't appear to be too large but unfortunately his skin flap was torn back does appear to be necrotic at this point. It is not dry yet but does seem to be progressing to that ground. The actual flap itself actually appears to be extremely cyanotic. Again without having folded in on itself I really do not think there's any chance of this reattaching at this point. This was discussed with the patient and her husband today. Due to the fact that the patient does have dementia husband is helping to make a lot of the decisions in regard to what we do we do not do here. He was in agreement with trimming all product flaps so that we can get this area together appropriately. Electronic Signature(s) Signed: 01/15/2019 11:01:46 PM By: Worthy Keeler PA-C Entered By: Worthy Keeler on 01/15/2019 22:20:53 [name] ([id number]) -------------------------------------------------------------------------------- Physician Orders Details [name] Date of Service: 01/14/2019 10:15 AM Medical Record Number: [id number] Patient Account Number: [id number] Date of Birth/Sex: 1925/11/06 (85 y.o. F) Treating RN: Montey Hora Primary Care Provider: Derinda Late Other Clinician: Referring Provider:  Referral, Self Treating Provider/Extender: STONE III, HOYT Weeks in Treatment: 0 Verbal / Phone Orders: No Diagnosis Coding ICD-10 Coding Code Description S81.802A Unspecified open wound, left lower leg, initial encounter L97.822 Non-pressure chronic ulcer of other part of left lower leg with fat layer exposed I48.0 Paroxysmal atrial fibrillation Z79.01 Long term (current) use of anticoagulants I10 Essential (primary) hypertension Wound Cleansing Wound #2 Left,Anterior Lower Leg o Clean wound with Normal Saline. o Cleanse wound with mild soap and water o May Shower, gently pat wound dry prior to applying new dressing. Anesthetic (add to Medication List) Wound #2 Left,Anterior Lower Leg o Topical Lidocaine 4% cream applied to wound bed prior to debridement (In Clinic Only). Primary Wound Dressing Wound #2 Left,Anterior Lower Leg o Santyl Ointment Secondary Dressing Wound #2 Left,Anterior Lower Leg o ABD and Kerlix/Conform Dressing Change Frequency Wound #2 Left,Anterior Lower Leg o Change dressing every day. Follow-up Appointments Wound #2 Left,Anterior Lower Leg o Return Appointment in 1 week. Edema Control Wound #2 Left,Anterior Lower Leg o Elevate legs to the level of the heart and pump ankles as often as possible o Other: - Tubigrip F Medications-please add to medication list. AYVA, VEILLEUX ([id number]) Wound #2 Left,Anterior Lower Leg o Santyl Enzymatic Ointment Patient Medications Allergies: No Known Drug Allergies Notifications Medication Indication Start End Keflex 01/14/2019 DOSE 1 - oral 500 mg capsule - 1 capsule oral taken 3 times a day for 10 days Santyl 01/14/2019 DOSE topical 250 unit/gram ointment - ointment topical applied nickel thick to the wound bed and then cover with a dressing as directed Electronic Signature(s) Signed: 01/14/2019 11:30:07 AM By: Worthy Keeler PA-C Entered By: Worthy Keeler on 01/14/2019  11:30:06 [name] ([id number]) -------------------------------------------------------------------------------- Problem List Details [name]. Date of Service: 01/14/2019 10:15 AM Medical Record Number: [id number] Patient Account Number: [id number] Date of Birth/Sex: 05/26/1926 (85 y.o. F) Treating RN: Montey Hora Primary Care Provider: Derinda Late Other Clinician: Referring Provider: Referral, Self Treating Provider/Extender: Melburn Hake, HOYT Weeks in Treatment: 0 Active Problems ICD-10 Evaluated Encounter Code Description Active Date Today Diagnosis S81.802A Unspecified open wound, left lower leg, initial encounter 01/14/2019  No Yes L97.822 Non-pressure chronic ulcer of other part of left lower leg with 01/14/2019 No Yes fat layer exposed I48.0 Paroxysmal atrial fibrillation 01/14/2019 No Yes Z79.01 Long term (current) use of anticoagulants 01/14/2019 No Yes I10 Essential (primary) hypertension 01/14/2019 No Yes Inactive Problems Resolved Problems Electronic Signature(s) Signed: 01/15/2019 11:01:46 PM By: Worthy Keeler PA-C Entered By: Worthy Keeler on 01/14/2019 11:07:32 Cornia, [name] ([id number]) -------------------------------------------------------------------------------- Progress Note Details [name]. Date of Service: 01/14/2019 10:15 AM Medical Record Number: [id number] Patient Account Number: [id number] Date of Birth/Sex: Nov 26, 1925 (85 y.o. F) Treating RN: Montey Hora Primary Care Provider: Derinda Late Other Clinician: Referring Provider: Referral, Self Treating Provider/Extender: Melburn Hake, HOYT Weeks in Treatment: 0 Subjective Chief Complaint Information obtained from Patient Left anterior shin ulcer History of Present Illness (HPI) 06/06/17 on evaluation today patient presents for evaluation concerning an ulcer which she initially had arise as a result of striking her left lower extremity  money bed frame. With that being said this was roughly one month ago and unfortunately the wound despite two courses of Keflex have not really made a dramatic improvement. This has continued to drain as well as being exquisitely tender at times. Even to the point that she is having a difficult time walking. Patient does have chronic atrial fibrillation which subsequently leads to her being on long-term anticoagulant therapy and this causes ED bruising which may have contributed to this entry and where things are at this point. She does have valvular heart disease as well as hypertension and her last INR was 2.7. Currently Neosporin, Gauls, and an ace wrap has been utilized. Keflex is the antibiotic that she has been on. Patient has not had a culture up to this point and she states that her blood pressure at the primary care office yesterday was 138/72. Her pain is significant related to be an eight out of 10. No fevers, chills, nausea, or vomiting noted at this time. 06/20/17; patient was admitted here 2 weeks ago. She had a traumatic wound on her left lateral lower leg probably in the setting of some degree of venous insufficiency with surrounding cellulitis. She had been on Keflex, after we saw her she was put on doxycycline which she apparently did not tolerate. Culture that was done at the time showed Morganella which was resistant to Keflex. Apparently the patient continued to take Keflex after the appointment. She is also on Coumadin. I had tried to change her antibiotic when the Morganella culture was brought to my attention however apparently our staff could not reach the patient to inform them of the antibiotic change 06/26/17; patient has wound on her left lateral lower extremity in the setting of some degree of venous insufficiency. I gave her cefdinir last week and she is completing this. There is no evidence of surrounding infection. Aggressive debridement last week, wound bed looks  healthier. We've been using Aquacel. They're traveling in the [place] next week we'll see her back in 2 weeks unless there are problems. 07/10/17; the patient is been using Aquacel Ag her husband is changing this with Kerlix and conform. She still complains of a lot of pain. Apparently with the antibiotics. We gave 2 or 3 weeks ago. Her INR went up to 6, this was managed by her primary physician 07/17/17; she is using Aquacel Ag and a border foam. Still discomfort although dimensions are better. 07/24/17; patient or for review of a trauma wound on her left anterior leg in the  setting of some degree of chronic venous insufficiency she has been using Aquacel Ag and a border foam and making progress. 07/31/17; only a small open area of this original significant trauma is still open. Her husband is changing this every second day [Aquacel Ag] Readmission: 01/14/19 on evaluation today patient is that she seen for initial inspection during the office visit today concerning a trauma/skin tear to the left anterior lower extremity. This is roughly the same region where she was previously treated and years past when she was here in the clinic. Nonetheless upon evaluation today the patient's wound actually showed signs of decent granulation around the edge although unfortunately the skin flap that was torn back did fold under on itself and the flap itself is dying. She does have some eschar surrounded the edges of the wound at this point. Nonetheless this also was very tender for her. The patient does have a history of hypertension, atrial fibrillation, and long-term use anticoagulant therapy. Wound History Patient presents with 1 open wound that has been present for approximately 1. Patient has been treating wound in the [place], [place] ([id number]) following manner: gauze. Laboratory tests have not been performed in the last month. Patient reportedly has not tested positive for an antibiotic resistant  organism. Patient reportedly has not tested positive for osteomyelitis. Patient reportedly has not had testing performed to evaluate circulation in the legs. Patient History Information obtained from Patient. Allergies No Known Drug Allergies Family History Cancer - Siblings, Hypertension - Father,Siblings, Stroke - Father, No family history of Diabetes, Heart Disease, Kidney Disease, Lung Disease, Seizures, Thyroid Problems, Tuberculosis. Social History Former smoker, Marital Status - Married, Alcohol Use - Daily, Drug Use - No History, Caffeine Use - Never. Medical History Eyes Patient has history of Cataracts - removed Denies history of Glaucoma, Optic Neuritis Ear/Nose/Mouth/Throat Denies history of Chronic sinus problems/congestion Hematologic/Lymphatic Denies history of Anemia, Hemophilia, Human Immunodeficiency Virus, Lymphedema, Sickle Cell Disease Respiratory Denies history of Aspiration, Asthma, Chronic Obstructive Pulmonary Disease (COPD), Pneumothorax, Sleep Apnea, Tuberculosis Cardiovascular Patient has history of Hypertension Denies history of Angina, Arrhythmia, Congestive Heart Failure, Coronary Artery Disease, Deep Vein Thrombosis, Hypotension, Myocardial Infarction, Peripheral Arterial Disease, Peripheral Venous Disease, Phlebitis, Vasculitis Gastrointestinal Denies history of Cirrhosis , Colitis, Crohn s, Hepatitis A, Hepatitis B, Hepatitis C Endocrine Denies history of Type I Diabetes, Type II Diabetes Genitourinary Denies history of End Stage Renal Disease Immunological Denies history of Lupus Erythematosus, Raynaud s, Scleroderma Integumentary (Skin) Denies history of History of Burn, History of pressure wounds Musculoskeletal Patient has history of Osteoarthritis - hands Denies history of Gout, Rheumatoid Arthritis, Osteomyelitis Neurologic Patient has history of Dementia Denies history of Neuropathy, Quadriplegia, Paraplegia, Seizure  Disorder Oncologic Denies history of Received Chemotherapy, Received Radiation Medical And Surgical History Notes Constitutional Symptoms (General Health) A-Fib; HTN; Valvular heart disease Review of Systems (ROS) Eyes The patient has no complaints or symptoms. Ear/Nose/Mouth/Throat [name] ([id number]) The patient has no complaints or symptoms. Hematologic/Lymphatic The patient has no complaints or symptoms. Respiratory The patient has no complaints or symptoms. Cardiovascular The patient has no complaints or symptoms. Gastrointestinal The patient has no complaints or symptoms. Endocrine The patient has no complaints or symptoms. Genitourinary The patient has no complaints or symptoms. Immunological The patient has no complaints or symptoms. Integumentary (Skin) The patient has no complaints or symptoms. Musculoskeletal The patient has no complaints or symptoms. Neurologic The patient has no complaints or symptoms. Oncologic The patient has no complaints or symptoms. Objective Constitutional  patient is hypertensive.. pulse regular and within target range for patient.Marland Kitchen respirations regular, non-labored and within target range for patient.Marland Kitchen temperature within target range for patient.. Well-nourished and well-hydrated in no acute distress. Vitals Time Taken: 10:34 AM, Height: 64 in, Source: Stated, Weight: 120 lbs, Source: Stated, BMI: 20.6, Temperature: 97.6  F, Pulse: 90 bpm, Respiratory Rate: 16 breaths/min, Blood Pressure: 188/104 mmHg. Eyes conjunctiva clear no eyelid edema noted. pupils equal round and reactive to light and accommodation. Ears, Nose, Mouth, and Throat no gross abnormality of ear auricles or external auditory canals. normal hearing noted during conversation. mucus membranes moist. Respiratory normal breathing without difficulty. clear to auscultation bilaterally. Cardiovascular regular rate and rhythm with normal S1, S2. no clubbing,  cyanosis, significant edema, Gastrointestinal (GI) soft, non-tender, non-distended, +BS. no ventral hernia noted. Musculoskeletal Patient unable to walk without assistance. no significant deformity or arthritic changes, no loss or range of motion, no Mceachron, Makaylia C. ([id number]) clubbing. Psychiatric this patient is able to make decisions and demonstrates good insight into disease process. Alert and Oriented x 3. pleasant and cooperative. General Notes: Upon inspection today patient's wound that she doesn't appear to be too large but unfortunately his skin flap was torn back does appear to be necrotic at this point. It is not dry yet but does seem to be progressing to that ground. The actual flap itself actually appears to be extremely cyanotic. Again without having folded in on itself I really do not think there's any chance of this reattaching at this point. This was discussed with the patient and her husband today. Due to the fact that the patient does have dementia husband is helping to make a lot of the decisions in regard to what we do we do not do here. He was in agreement with trimming all product flaps so that we can get this area together appropriately. Integumentary (Hair, Skin) Wound #2 status is Open. Original cause of wound was Trauma. The wound is located on the Left,Anterior Lower Leg. The wound measures 2.4cm length x 2cm width x 0.1cm depth; 3.77cm^2 area and 0.377cm^3 volume. The wound is limited to skin breakdown. There is no tunneling or undermining noted. There is a none present amount of drainage noted. The wound margin is flat and intact. There is no granulation within the wound bed. There is a large (67-100%) amount of necrotic tissue within the wound bed including Eschar. The periwound skin appearance did not exhibit: Callus, Crepitus, Excoriation, Induration, Rash, Scarring, Dry/Scaly, Maceration, Atrophie Blanche, Cyanosis, Ecchymosis, Hemosiderin Staining,  Mottled, Pallor, Rubor, Erythema. Periwound temperature was noted as No Abnormality. The periwound has tenderness on palpation. Assessment Active Problems ICD-10 Unspecified open wound, left lower leg, initial encounter Non-pressure chronic ulcer of other part of left lower leg with fat layer exposed Paroxysmal atrial fibrillation Long term (current) use of anticoagulants Essential (primary) hypertension Procedures Wound #2 Pre-procedure diagnosis of Wound #2 is a Trauma, Other located on the Left,Anterior Lower Leg . There was a Selective/Open Wound Skin/Epidermis Debridement with a total area of 4.8 sq cm performed by STONE III, HOYT E., PA-C. With the following instrument(s): Forceps, and Scissors to remove Non-Viable tissue/material. Material removed includes Skin: Dermis and Skin: Epidermis and after achieving pain control using Lidocaine 4% Topical Solution. No specimens were taken. A time out was conducted at 11:20, prior to the start of the procedure. There was no bleeding. The procedure was tolerated well with a pain level of 0 throughout and a pain level of 0  following the procedure. Post Debridement Measurements: 2.4cm length x 2cm width x 0.1cm depth; 0.377cm^3 volume. Character of Wound/Ulcer Post Debridement is improved. Post procedure Diagnosis Wound #2: Same as Pre-Procedure Pope, Oliva C. ([id number]) Plan Wound Cleansing: Wound #2 Left,Anterior Lower Leg: Clean wound with Normal Saline. Cleanse wound with mild soap and water May Shower, gently pat wound dry prior to applying new dressing. Anesthetic (add to Medication List): Wound #2 Left,Anterior Lower Leg: Topical Lidocaine 4% cream applied to wound bed prior to debridement (In Clinic Only). Primary Wound Dressing: Wound #2 Left,Anterior Lower Leg: Santyl Ointment Secondary Dressing: Wound #2 Left,Anterior Lower Leg: ABD and Kerlix/Conform Dressing Change Frequency: Wound #2 Left,Anterior Lower  Leg: Change dressing every day. Follow-up Appointments: Wound #2 Left,Anterior Lower Leg: Return Appointment in 1 week. Edema Control: Wound #2 Left,Anterior Lower Leg: Elevate legs to the level of the heart and pump ankles as often as possible Other: - Tubigrip F Medications-please add to medication list.: Wound #2 Left,Anterior Lower Leg: Santyl Enzymatic Ointment The following medication(s) was prescribed: Keflex oral 500 mg capsule 1 1 capsule oral taken 3 times a day for 10 days starting 01/14/2019 Santyl topical 250 unit/gram ointment ointment topical applied nickel thick to the wound bed and then cover with a dressing as directed starting 01/14/2019 At this point my suggestion is gonna be that we go ahead and initiate the above wound care measures for the next week. I think Allie Bossier will be the most appropriate continues although if it turns out to be too expensive the patient could be last [facility] as a secondary/backup option. They understand. If anything changes or worsens meantime they will contact the office let me know. Please see above for specific wound care orders. We will see patient for re-evaluation in 1 week(s) here in the clinic. If anything worsens or changes patient will contact our office for additional recommendations. Electronic Signature(s) Signed: 01/15/2019 11:01:46 PM By: Worthy Keeler PA-C Entered By: Worthy Keeler on 01/15/2019 22:21:04 [name] ([id number]) -------------------------------------------------------------------------------- ROS/PFSH Details [name] Date of Service: 01/14/2019 10:15 AM Medical Record Number: [id number] Patient Account Number: [id number] Date of Birth/Sex: [birth year] (85 y.o. F) Treating RN: Army Melia Primary Care Provider: Derinda Late Other Clinician: Referring Provider: Referral, Self Treating Provider/Extender: STONE III, HOYT Weeks in Treatment: 0 Information Obtained  From Patient Wound History Do you currently have one or more open woundso Yes How many open wounds do you currently haveo 1 Approximately how long have you had your woundso 1 How have you been treating your wound(s) until nowo gauze Has your wound(s) ever healed and then re-openedo No Have you had any lab work done in the past montho No Have you tested positive for osteomyelitis (bone infection)o No Have you had any tests for circulation on your legso No Ear/Nose/Mouth/Throat Complaints and Symptoms: No Complaints or Symptoms Complaints and Symptoms: Negative for: Difficult clearing ears; Sinusitis Medical History: Negative for: Chronic sinus problems/congestion Respiratory Complaints and Symptoms: No Complaints or Symptoms Complaints and Symptoms: Negative for: Chronic or frequent coughs; Shortness of Breath Medical History: Negative for: Aspiration; Asthma; Chronic Obstructive Pulmonary Disease (COPD); Pneumothorax; Sleep Apnea; Tuberculosis Cardiovascular Complaints and Symptoms: No Complaints or Symptoms Complaints and Symptoms: Negative for: Chest pain; LE edema Medical History: Positive for: Hypertension Negative for: Angina; Arrhythmia; Congestive Heart Failure; Coronary Artery Disease; Deep Vein Thrombosis; Hypotension; Myocardial Infarction; Peripheral Arterial Disease; Peripheral Venous Disease; Phlebitis; Vasculitis Haggard, Arvilla C. ([id number]) Gastrointestinal Complaints and Symptoms: No Complaints  or Symptoms Complaints and Symptoms: Negative for: Frequent diarrhea; Nausea; Vomiting Medical History: Negative for: Cirrhosis ; Colitis; Crohnos; Hepatitis A; Hepatitis B; Hepatitis C Endocrine Complaints and Symptoms: No Complaints or Symptoms Complaints and Symptoms: Negative for: Hepatitis; Thyroid disease; Polydypsia (Excessive Thirst) Medical History: Negative for: Type I Diabetes; Type II Diabetes Genitourinary Complaints and Symptoms: No Complaints  or Symptoms Complaints and Symptoms: Negative for: Kidney failure/ Dialysis; Incontinence/dribbling Medical History: Negative for: End Stage Renal Disease Immunological Complaints and Symptoms: No Complaints or Symptoms Complaints and Symptoms: Negative for: Hives; Itching Medical History: Negative for: Lupus Erythematosus; Raynaudos; Scleroderma Integumentary (Skin) Complaints and Symptoms: No Complaints or Symptoms Complaints and Symptoms: Negative for: Wounds; Bleeding or bruising tendency; Breakdown; Swelling Medical History: Negative for: History of Burn; History of pressure wounds Musculoskeletal Complaints and Symptoms: No Complaints or Symptoms Complaints and Symptoms: Grindle, Irianna C. ([id number]) Negative for: Muscle Pain; Muscle Weakness Medical History: Positive for: Osteoarthritis - hands Negative for: Gout; Rheumatoid Arthritis; Osteomyelitis Neurologic Complaints and Symptoms: No Complaints or Symptoms Complaints and Symptoms: Negative for: Numbness/parasthesias; Focal/Weakness Medical History: Positive for: Dementia Negative for: Neuropathy; Quadriplegia; Paraplegia; Seizure Disorder Constitutional Symptoms (General Health) Medical History: Past Medical History Notes: A-Fib; HTN; Valvular heart disease Eyes Complaints and Symptoms: No Complaints or Symptoms Medical History: Positive for: Cataracts - removed Negative for: Glaucoma; Optic Neuritis Hematologic/Lymphatic Complaints and Symptoms: No Complaints or Symptoms Medical History: Negative for: Anemia; Hemophilia; Human Immunodeficiency Virus; Lymphedema; Sickle Cell Disease Oncologic Complaints and Symptoms: No Complaints or Symptoms Medical History: Negative for: Received Chemotherapy; Received Radiation HBO Extended History Items Eyes: Cataracts Immunizations Pneumococcal Vaccine: Received Pneumococcal Vaccination: Yes Implantable Devices JALENA, VANDERLINDEN  ([id number]) None Family and Social History Cancer: Yes - Siblings; Diabetes: No; Heart Disease: No; Hypertension: Yes - Father,Siblings; Kidney Disease: No; Lung Disease: No; Seizures: No; Stroke: Yes - Father; Thyroid Problems: No; Tuberculosis: No; Former smoker; Marital Status - Married; Alcohol Use: Daily; Drug Use: No History; Caffeine Use: Never; Financial Concerns: No; Food, Clothing or Shelter Needs: No; Support System Lacking: No; Transportation Concerns: No; Advanced Directives: Yes (Not Provided); Patient does not want information on Advanced Directives; Living Will: Yes (Copy provided); Medical Power of Attorney: Yes (Not Provided) Electronic Signature(s) Signed: 01/14/2019 4:18:28 PM By: Army Melia Signed: 01/15/2019 11:01:46 PM By: Worthy Keeler PA-C Signed: 01/16/2019 9:14:45 AM By: Gretta Cool, BSN, RN, [name], Kim RN, BSN Entered By: Gretta Cool, BSN, RN, [name], Kim on 01/14/2019 10:55:01 [name] ([id number]) -------------------------------------------------------------------------------- SuperBill Details Patient Name: TINZLEE, CRAKER. Date of Service: 01/14/2019 Medical Record Number: [id number] Patient Account Number: [id number] Date of Birth/Sex: 10/19/1926 (85 y.o. F) Treating RN: Montey Hora Primary Care Provider: Derinda Late Other Clinician: Referring Provider: Referral, Self Treating Provider/Extender: STONE III, HOYT Weeks in Treatment: 0 Diagnosis Coding ICD-10 Codes Code Description S81.802A Unspecified open wound, left lower leg, initial encounter L97.822 Non-pressure chronic ulcer of other part of left lower leg with fat layer exposed I48.0 Paroxysmal atrial fibrillation Z79.01 Long term (current) use of anticoagulants I10 Essential (primary) hypertension Facility Procedures CPT4 Code Description: [id number] - WOUND CARE VISIT-LEV 3 EST PT Modifier: Quantity: 1 CPT4 Code Description: [id number] - DEBRIDE WOUND 1ST 20 SQ CM OR < ICD-10  Diagnosis Description L97.822 Non-pressure chronic ulcer of other part of left lower leg with Modifier: fat layer expos Quantity: 1 ed Physician Procedures CPT4 Code Description: [id number] - WC PHYS LEVEL 4 - EST PT ICD-10 Diagnosis Description S81.802A Unspecified open wound, left lower leg, initial  encounter [phone] Non-pressure chronic ulcer of other part of left lower leg with I48.0 Paroxysmal  atrial fibrillation Z79.01 Long term (current) use of anticoagulants Modifier: 25 fat layer expos Quantity: 1 ed CPT4 Code Description: [id number] 39532 - WC PHYS DEBR WO ANESTH 20 SQ CM ICD-10 Diagnosis Description L97.822 Non-pressure chronic ulcer of other part of left lower leg with Modifier: fat layer expos Quantity: 1 ed Electronic Signature(s) Signed: 01/15/2019 11:01:46 PM By: Worthy Keeler PA-C Entered By: Worthy Keeler on 01/15/2019 22:21:36

## 2019-01-16 NOTE — Progress Notes (Signed)
JADIN, KAGEL ([id number]) Visit Report for 01/14/2019 Allergy List Details [name], [name]. Date of Service: 01/14/2019 10:15 AM Medical Record Number: [id number] Patient Account Number: [id number] Date of Birth/Sex: 07/13/1926 (85 y.o. F) Treating RN: Army Melia Primary Care Synthia Fairbank: Derinda Late Other Clinician: Referring Emberli Ballester: Referral, Self Treating Yareth Macdonnell/Extender: STONE III, HOYT Weeks in Treatment: 0 Allergies Active Allergies No Known Drug Allergies Allergy Notes Electronic Signature(s) Signed: 01/14/2019 4:18:28 PM By: Army Melia Entered By: Army Melia on 01/14/2019 10:37:19 Mahajan, [name] ([id number]) -------------------------------------------------------------------------------- Arrival Information Details [name]. Date of Service: 01/14/2019 10:15 AM Medical Record Number: [id number] Patient Account Number: [id number] Date of Birth/Sex: 10-Jul-1926 (85 y.o. F) Treating RN: Army Melia Primary Care Edword Cu: Derinda Late Other Clinician: Referring Aloni Chuang: Referral, Self Treating Mariana Goytia/Extender: STONE III, HOYT Weeks in Treatment: 0 Visit Information Patient Arrived: Ambulatory Arrival Time: 10:33 Accompanied By: spouse Transfer Assistance: None History Since Last Visit Added or deleted any medications: No Any new allergies or adverse reactions: No Had a fall or experienced change in activities of daily living that may affect risk of falls: No Signs or symptoms of abuse/neglect since last visito No Hospitalized since last visit: No Implantable device outside of the clinic excluding cellular tissue based products placed in the center since last visit: No Electronic Signature(s) Signed: 01/14/2019 4:18:28 PM By: Army Melia Entered By: Army Melia on 01/14/2019 10:33:49 Boltz, [name] ([id number]) -------------------------------------------------------------------------------- Clinic Level of  Care Assessment Details [name]. Date of Service: 01/14/2019 10:15 AM Medical Record Number: [id number] Patient Account Number: [id number] Date of Birth/Sex: 04-29-1926 (85 y.o. F) Treating RN: Montey Hora Primary Care Cleona Doubleday: Derinda Late Other Clinician: Referring Chassie Pennix: Referral, Self Treating Jahnia Hewes/Extender: STONE III, HOYT Weeks in Treatment: 0 Clinic Level of Care Assessment Items TOOL 1 Quantity Score []  - Use when EandM and Procedure is performed on INITIAL visit 0 ASSESSMENTS - Nursing Assessment / Reassessment X - General Physical Exam (combine w/ comprehensive assessment (listed just below) when 1 20 performed on new pt. evals) X- 1 25 Comprehensive Assessment (HX, ROS, Risk Assessments, Wounds Hx, etc.) ASSESSMENTS - Wound and Skin Assessment / Reassessment []  - Dermatologic / Skin Assessment (not related to wound area) 0 ASSESSMENTS - Ostomy and/or Continence Assessment and Care []  - Incontinence Assessment and Management 0 []  - 0 Ostomy Care Assessment and Management (repouching, etc.) PROCESS - Coordination of Care X - Simple Patient / Family Education for ongoing care 1 15 []  - 0 Complex (extensive) Patient / Family Education for ongoing care X- 1 10 Staff obtains Programmer, systems, Records, Test Results / Process Orders []  - 0 Staff telephones HHA, Nursing Homes / Clarify orders / etc []  - 0 Routine Transfer to another Facility (non-emergent condition) []  - 0 Routine Hospital Admission (non-emergent condition) X- 1 15 New Admissions / Biomedical engineer / Ordering NPWT, Apligraf, etc. []  - 0 Emergency Hospital Admission (emergent condition) PROCESS - Special Needs []  - Pediatric / Minor Patient Management 0 []  - 0 Isolation Patient Management []  - 0 Hearing / Language / Visual special needs []  - 0 Assessment of Community assistance (transportation, D/C planning, etc.) []  - 0 Additional assistance / Altered  mentation []  - 0 Support Surface(s) Assessment (bed, cushion, seat, etc.) Bayliss, Olena C. ([id number]) INTERVENTIONS - Miscellaneous []  - External ear exam 0 []  - 0 Patient Transfer (multiple staff / Civil Service fast streamer / Similar devices) []  - 0 Simple Staple / Suture removal (25 or less) []  -  0 Complex Staple / Suture removal (26 or more) []  - 0 Hypo/Hyperglycemic Management (do not check if billed separately) []  - 0 Ankle / Brachial Index (ABI) - do not check if billed separately Has the patient been seen at the hospital within the last three years: Yes Total Score: 85 Level Of Care: New/Established - Level 3 Electronic Signature(s) Signed: 01/14/2019 5:06:46 PM By: Montey Hora Entered By: Montey Hora on 01/14/2019 11:18:40 Rybarczyk, [name] ([id number]) -------------------------------------------------------------------------------- Lower Extremity Assessment Details [name]. Date of Service: 01/14/2019 10:15 AM Medical Record Number: [id number] Patient Account Number: [id number] Date of Birth/Sex: 12-15-1925 (85 y.o. F) Treating RN: Army Melia Primary Care Dolton Shaker: Derinda Late Other Clinician: Referring Elijah Phommachanh: Referral, Self Treating Asjah Rauda/Extender: STONE III, HOYT Weeks in Treatment: 0 Edema Assessment Assessed: [Left: No] [Right: No] Edema: [Left: N] [Right: o] Calf Left: Right: Point of Measurement: 30 cm From Medial Instep 30.6 cm cm Ankle Left: Right: Point of Measurement: 10 cm From Medial Instep 19.6 cm cm Vascular Assessment Pulses: Dorsalis Pedis Palpable: [Left:Yes] Posterior Tibial Palpable: [Left:Yes] Extremity colors, hair growth, and conditions: Extremity Color: [Left:Red] Hair Growth on Extremity: [Left:No] Temperature of Extremity: [Left:Warm < 3 seconds] Toe Nail Assessment Left: Right: Thick: No Discolored: No Deformed: No Improper Length and Hygiene: No Electronic Signature(s) Signed: 01/14/2019 4:18:28  PM By: Army Melia Entered By: Army Melia on 01/14/2019 10:49:15 Junkin, [name] ([id number]) -------------------------------------------------------------------------------- Multi Wound Chart Details [name]. Date of Service: 01/14/2019 10:15 AM Medical Record Number: [id number] Patient Account Number: [id number] Date of Birth/Sex: 1926-08-04 (85 y.o. F) Treating RN: Montey Hora Primary Care Yasmin Dibello: Derinda Late Other Clinician: Referring Jaydian Santana: Referral, Self Treating Keyah Blizard/Extender: STONE III, HOYT Weeks in Treatment: 0 Vital Signs Height(in): 64 Pulse(bpm): 90 Weight(lbs): 120 Blood Pressure(mmHg): 188/104 Body Mass Index(BMI): 21 Temperature(F): 97.6 Respiratory Rate 16 (breaths/min): Photos: [N/A:N/A] Wound Location: Left Lower Leg - Anterior N/A N/A Wounding Event: Trauma N/A N/A Primary Etiology: Trauma, Other N/A N/A Comorbid History: Cataracts, Arrhythmia, N/A N/A Hypertension, Osteoarthritis, Dementia Date Acquired: 01/07/2019 N/A N/A Weeks of Treatment: 0 N/A N/A Wound Status: Open N/A N/A Measurements L x W x D 2.4x2x0.1 N/A N/A (cm) Area (cm) : 3.77 N/A N/A Volume (cm) : 0.377 N/A N/A % Reduction in Area: 0.00% N/A N/A % Reduction in Volume: 0.00% N/A N/A Classification: Partial Thickness N/A N/A Exudate Amount: None Present N/A N/A Wound Margin: Flat and Intact N/A N/A Granulation Amount: None Present (0%) N/A N/A Necrotic Amount: Large (67-100%) N/A N/A Necrotic Tissue: Eschar N/A N/A Exposed Structures: Fascia: No N/A N/A Fat Layer (Subcutaneous Tissue) Exposed: No Tendon: No Muscle: No Joint: No Bone: No Limited to Skin Breakdown Butrum, Tamea C. ([id number]) Periwound Skin Texture: Excoriation: No N/A N/A Induration: No Callus: No Crepitus: No Rash: No Scarring: No Periwound Skin Moisture: Maceration: No N/A N/A Dry/Scaly: No Periwound Skin Color: Atrophie Blanche: No N/A N/A Cyanosis:  No Ecchymosis: No Erythema: No Hemosiderin Staining: No Mottled: No Pallor: No Rubor: No Temperature: No Abnormality N/A N/A Tenderness on Palpation: Yes N/A N/A Wound Preparation: Ulcer Cleansing: N/A N/A Rinsed/Irrigated with Saline Topical Anesthetic Applied: Other: lidocaine 4% Treatment Notes Electronic Signature(s) Signed: 01/14/2019 5:06:46 PM By: Montey Hora Entered By: Montey Hora on 01/14/2019 11:11:17 [name] ([id number]) -------------------------------------------------------------------------------- Multi-Disciplinary Care Plan Details Patient Name: SHARA, HARTIS. Date of Service: 01/14/2019 10:15 AM Medical Record Number: [id number] Patient Account Number: [id number] Date of Birth/Sex: [birth year] (85 y.o. F) Treating RN: Army Melia Primary  Care Radley Teston: BABAOFF, MARCUS Other Clinician: Referring Angeleena Dueitt: Referral, Self Treating Oriya Kettering/Extender: STONE III, HOYT Weeks in Treatment: 0 Active Inactive Abuse / Safety / Falls / Self Care Management Nursing Diagnoses: Potential for falls Goals: Patient will remain injury free related to falls Date Initiated: 01/14/2019 Target Resolution Date: 03/29/2019 Goal Status: Active Interventions: Assess fall risk on admission and as needed Notes: Orientation to the Wound Care Program Nursing Diagnoses: Knowledge deficit related to the wound healing center program Goals: Patient/caregiver will verbalize understanding of the [place] Program Date Initiated: 01/14/2019 Target Resolution Date: 03/29/2019 Goal Status: Active Interventions: Provide education on orientation to the wound center Notes: Wound/Skin Impairment Nursing Diagnoses: Impaired tissue integrity Goals: Ulcer/skin breakdown will heal within 14 weeks Date Initiated: 01/14/2019 Target Resolution Date: 03/29/2019 Goal Status: Active Interventions: Assess patient/caregiver ability to obtain necessary supplies Blinn,  CHANDRIKA SANDLES ([id number]) Assess patient/caregiver ability to perform ulcer/skin care regimen upon admission and as needed Assess ulceration(s) every visit Notes: Electronic Signature(s) Signed: 01/14/2019 4:18:28 PM By: Army Melia Signed: 01/14/2019 5:06:46 PM By: Montey Hora Entered By: Montey Hora on 01/14/2019 11:11:02 Shellenbarger, [name] ([id number]) -------------------------------------------------------------------------------- Pain Assessment Details [name]. Date of Service: 01/14/2019 10:15 AM Medical Record Number: [id number] Patient Account Number: [id number] Date of Birth/Sex: Jun 27, 1926 (85 y.o. F) Treating RN: Army Melia Primary Care Konor Noren: Derinda Late Other Clinician: Referring Drayke Grabel: Referral, Self Treating Makailey Hodgkin/Extender: STONE III, HOYT Weeks in Treatment: 0 Active Problems Location of Pain Severity and Description of Pain Patient Has Paino No Site Locations Pain Management and Medication Current Pain Management: Electronic Signature(s) Signed: 01/14/2019 4:18:28 PM By: Army Melia Entered By: Army Melia on 01/14/2019 10:33:55 [name] ([id number]) -------------------------------------------------------------------------------- Patient/Caregiver Education Details [name]. Date of Service: 01/14/2019 10:15 AM Medical Record Number: [id number] Patient Account Number: [id number] Date of Birth/Gender: Sep 20, 1926 (85 y.o. F) Treating RN: Montey Hora Primary Care Physician: Derinda Late Other Clinician: Referring Physician: Referral, Self Treating Physician/Extender: Melburn Hake, HOYT Weeks in Treatment: 0 Education Assessment Education Provided To: Patient and Caregiver Education Topics Provided Wound/Skin Impairment: Handouts: Other: wound care as ordered Methods: Demonstration, Explain/Verbal Responses: State content correctly Electronic Signature(s) Signed: 01/14/2019 5:06:46 PM  By: Montey Hora Entered By: Montey Hora on 01/14/2019 11:19:06 Devino, [name] ([id number]) -------------------------------------------------------------------------------- Wound Assessment Details Patient Name: Thomes Lolling C. Date of Service: 01/14/2019 10:15 AM Medical Record Number: [id number] Patient Account Number: [id number] Date of Birth/Sex: [birth year] (85 y.o. F) Treating RN: Army Melia Primary Care Willisha Sligar: Derinda Late Other Clinician: Referring Aiken Withem: Referral, Self Treating Cadi Rhinehart/Extender: STONE III, HOYT Weeks in Treatment: 0 Wound Status Wound Number: 2 Primary Trauma, Other Etiology: Wound Location: Left Lower Leg - Anterior Wound Status: Open Wounding Event: Trauma Comorbid Cataracts, Arrhythmia, Hypertension, Date Acquired: 01/07/2019 History: Osteoarthritis, Dementia Weeks Of Treatment: 0 Clustered Wound: No Photos Wound Measurements Length: (cm) 2.4 % Reduct Width: (cm) 2 % Reduct Depth: (cm) 0.1 Tunnelin Area: (cm) 3.77 Undermi Volume: (cm) 0.377 ion in Area: 0% ion in Volume: 0% g: No ning: No Wound Description Classification: Partial Thickness Foul Odo Wound Margin: Flat and Intact Slough/F Exudate Amount: None Present r After Cleansing: No ibrino No Wound Bed Granulation Amount: None Present (0%) Exposed Structure Necrotic Amount: Large (67-100%) Fascia Exposed: No Necrotic Quality: Eschar Fat Layer (Subcutaneous Tissue) Exposed: No Tendon Exposed: No Muscle Exposed: No Joint Exposed: No Bone Exposed: No Limited to Skin Breakdown Periwound Skin Texture Texture Color No Abnormalities Noted: No No Abnormalities Noted: No Filion,  SHERRICE CREEKMORE ([id number]) Callus: No Atrophie Blanche: No Crepitus: No Cyanosis: No Excoriation: No Ecchymosis: No Induration: No Erythema: No Rash: No Hemosiderin Staining: No Scarring: No Mottled: No Pallor: No Moisture Rubor: No No Abnormalities Noted: No Dry / Scaly: No  Temperature / Pain Maceration: No Temperature: No Abnormality Tenderness on Palpation: Yes Wound Preparation Ulcer Cleansing: Rinsed/Irrigated with Saline Topical Anesthetic Applied: Other: lidocaine 4%, Electronic Signature(s) Signed: 01/14/2019 4:18:28 PM By: Army Melia Entered By: Army Melia on 01/14/2019 10:50:45 [name] ([id number]) -------------------------------------------------------------------------------- Vitals Details [name]. Date of Service: 01/14/2019 10:15 AM Medical Record Number: [id number] Patient Account Number: [id number] Date of Birth/Sex: 1925/11/13 (85 y.o. F) Treating RN: Army Melia Primary Care George Haggart: Derinda Late Other Clinician: Referring Baldomero Mirarchi: Referral, Self Treating Cambren Helm/Extender: STONE III, HOYT Weeks in Treatment: 0 Vital Signs Time Taken: 10:34 Temperature (F): 97.6 Height (in): 64 Pulse (bpm): 90 Source: Stated Respiratory Rate (breaths/min): 16 Weight (lbs): 120 Blood Pressure (mmHg): 188/104 Source: Stated Reference Range: 80 - 120 mg / dl Body Mass Index (BMI): 20.6 Electronic Signature(s) Signed: 01/14/2019 4:18:28 PM By: Army Melia Entered By: Army Melia on 01/14/2019 10:35:26

## 2019-01-21 ENCOUNTER — Encounter: Payer: Medicare Other | Admitting: Physician Assistant

## 2019-01-21 ENCOUNTER — Other Ambulatory Visit: Payer: Self-pay

## 2019-01-21 DIAGNOSIS — E11622 Type 2 diabetes mellitus with other skin ulcer: Secondary | ICD-10-CM | POA: Diagnosis not present

## 2019-01-22 NOTE — Progress Notes (Signed)
NEVEYAH, GARZON ([id number]) Visit Report for 01/21/2019 Arrival Information Details Patient Name: IZADORA, ROEHR. Date of Service: 01/21/2019 10:00 AM Medical Record Number: [id number] Patient Account Number: [id number] Date of Birth/Sex: March 25, 1926 (85 y.o. F) Treating RN: Harold Barban Primary Care Vinette Crites: BABAOFF, MARCUS Other Clinician: Referring Maricel Swartzendruber: BABAOFF, MARCUS Treating Sigifredo Pignato/Extender: STONE III, HOYT Weeks in Treatment: 1 Visit Information History Since Last Visit Added or deleted any medications: No Patient Arrived: Ambulatory Any new allergies or adverse reactions: No Arrival Time: 10:14 Had a fall or experienced change in No Accompanied By: husband activities of daily living that may affect Transfer Assistance: None risk of falls: Patient Identification Verified: Yes Signs or symptoms of abuse/neglect since last visito No Secondary Verification Process Yes Hospitalized since last visit: No Completed: Has Dressing in Place as Prescribed: Yes Patient Has Alerts: Yes Pain Present Now: Yes Patient Alerts: NEED ABI - TOO PAINFUL Electronic Signature(s) Signed: 01/21/2019 4:25:13 PM By: Montey Hora Entered By: Montey Hora on 01/21/2019 10:35:11 [name] ([id number]) -------------------------------------------------------------------------------- Encounter Discharge Information Details Patient Name: [name]. Date of Service: 01/21/2019 10:00 AM Medical Record Number: [id number] Patient Account Number: [id number] Date of Birth/Sex: 11/24/1925 (85 y.o. F) Treating RN: Harold Barban Primary Care Zianna Dercole: Derinda Late Other Clinician: Referring Lavera Vandermeer: BABAOFF, MARCUS Treating Shay Bartoli/Extender: STONE III, HOYT Weeks in Treatment: 1 Encounter Discharge Information Items Post Procedure Vitals Discharge Condition: Stable Temperature (F): 98.6 Ambulatory Status: Ambulatory Pulse (bpm): 72 Discharge Destination:  Home Respiratory Rate (breaths/min): 18 Transportation: Private Auto Blood Pressure (mmHg): 145/85 Accompanied By: husband Schedule Follow-up Appointment: Yes Clinical Summary of Care: Electronic Signature(s) Signed: 01/21/2019 3:46:03 PM By: Harold Barban Entered By: Harold Barban on 01/21/2019 11:04:45 Michelin, [name] ([id number]) -------------------------------------------------------------------------------- Lower Extremity Assessment Details Patient Name: [name]. Date of Service: 01/21/2019 10:00 AM Medical Record Number: [id number] Patient Account Number: [id number] Date of Birth/Sex: June 14, 1926 (85 y.o. F) Treating RN: Harold Barban Primary Care Zacharias Ridling: Derinda Late Other Clinician: Referring Stephania Macfarlane: BABAOFF, MARCUS Treating Milayna Rotenberg/Extender: STONE III, HOYT Weeks in Treatment: 1 Vascular Assessment Pulses: Dorsalis Pedis Palpable: [Left:Yes] Posterior Tibial Palpable: [Left:Yes] Extremity colors, hair growth, and conditions: Extremity Color: [Left:Red] Hair Growth on Extremity: [Left:Yes] Temperature of Extremity: [Left:Warm < 3 seconds] Electronic Signature(s) Signed: 01/21/2019 3:46:03 PM By: Harold Barban Entered By: Harold Barban on 01/21/2019 10:23:44 Schoffstall, [name] ([id number]) -------------------------------------------------------------------------------- Multi Wound Chart Details Patient Name: [name]. Date of Service: 01/21/2019 10:00 AM Medical Record Number: [id number] Patient Account Number: [id number] Date of Birth/Sex: 1925/12/04 (85 y.o. F) Treating RN: Montey Hora Primary Care Danisa Kopec: BABAOFF, MARCUS Other Clinician: Referring Jacayla Nordell: BABAOFF, MARCUS Treating Francile Woolford/Extender: STONE III, HOYT Weeks in Treatment: 1 Vital Signs Height(in): 64 Pulse(bpm): 72 Weight(lbs): 120 Blood Pressure(mmHg): 145/85 Body Mass Index(BMI): 21 Temperature(F): 98.6 Respiratory Rate 18 (breaths/min): Photos:  [N/A:N/A] Wound Location: Left Lower Leg - Anterior N/A N/A Wounding Event: Trauma N/A N/A Primary Etiology: Trauma, Other N/A N/A Comorbid History: Cataracts, Hypertension, N/A N/A Osteoarthritis, Dementia Date Acquired: 01/07/2019 N/A N/A Weeks of Treatment: 1 N/A N/A Wound Status: Open N/A N/A Measurements L x W x D 2.2x2x0.1 N/A N/A (cm) Area (cm) : 3.456 N/A N/A Volume (cm) : 0.346 N/A N/A % Reduction in Area: 8.30% N/A N/A % Reduction in Volume: 8.20% N/A N/A Classification: Partial Thickness N/A N/A Exudate Amount: None Present N/A N/A Wound Margin: Flat and Intact N/A N/A Granulation Amount: None Present (0%) N/A N/A Necrotic Amount: Large (67-100%) N/A N/A Necrotic Tissue: Eschar N/A N/A Exposed Structures: Fascia:  No N/A N/A Fat Layer (Subcutaneous Tissue) Exposed: No Tendon: No Muscle: No Joint: No Bone: No Limited to Skin Breakdown Periwound Skin Texture: N/A N/A Cliff, Keena C. ([id number]) Excoriation: No Induration: No Callus: No Crepitus: No Rash: No Scarring: No Periwound Skin Moisture: Maceration: No N/A N/A Dry/Scaly: No Periwound Skin Color: Atrophie Blanche: No N/A N/A Cyanosis: No Ecchymosis: No Erythema: No Hemosiderin Staining: No Mottled: No Pallor: No Rubor: No Temperature: No Abnormality N/A N/A Tenderness on Palpation: Yes N/A N/A Wound Preparation: Ulcer Cleansing: N/A N/A Rinsed/Irrigated with Saline Topical Anesthetic Applied: Other: lidocaine 4% Treatment Notes Electronic Signature(s) Signed: 01/21/2019 4:25:13 PM By: Montey Hora Entered By: Montey Hora on 01/21/2019 10:33:58 [name] ([id number]) -------------------------------------------------------------------------------- Multi-Disciplinary Care Plan Details Patient Name: JHANVI, DRAKEFORD. Date of Service: 01/21/2019 10:00 AM Medical Record Number: [id number] Patient Account Number: [id number] Date of Birth/Sex: 1925/12/29 (85 y.o. F) Treating  RN: Montey Hora Primary Care Arnesia Vincelette: Derinda Late Other Clinician: Referring Wanza Szumski: BABAOFF, MARCUS Treating Caydin Yeatts/Extender: STONE III, HOYT Weeks in Treatment: 1 Active Inactive Abuse / Safety / Falls / Self Care Management Nursing Diagnoses: Potential for falls Goals: Patient will remain injury free related to falls Date Initiated: 01/14/2019 Target Resolution Date: 03/29/2019 Goal Status: Active Interventions: Assess fall risk on admission and as needed Notes: Necrotic Tissue Nursing Diagnoses: Impaired tissue integrity related to necrotic/devitalized tissue Goals: Patient/caregiver will verbalize understanding of reason and process for debridement of necrotic tissue Date Initiated: 01/21/2019 Target Resolution Date: 03/29/2019 Goal Status: Active Interventions: Provide education on necrotic tissue and debridement process Notes: Orientation to the Wound Care Program Nursing Diagnoses: Knowledge deficit related to the wound healing center program Goals: Patient/caregiver will verbalize understanding of the [facility] Date Initiated: 01/14/2019 Target Resolution Date: 03/29/2019 Goal Status: Active Interventions: Provide education on orientation to the wound center ALVIE, SPELTZ. ([id number]) Notes: Pain, Acute or Chronic Nursing Diagnoses: Pain, acute or chronic: actual or potential Goals: Patient will verbalize adequate pain control and receive pain control interventions during procedures as needed Date Initiated: 01/21/2019 Target Resolution Date: 03/29/2019 Goal Status: Active Interventions: Complete pain assessment as per visit requirements Notes: Wound/Skin Impairment Nursing Diagnoses: Impaired tissue integrity Goals: Ulcer/skin breakdown will heal within 14 weeks Date Initiated: 01/14/2019 Target Resolution Date: 03/29/2019 Goal Status: Active Interventions: Assess patient/caregiver ability to obtain necessary supplies Assess  patient/caregiver ability to perform ulcer/skin care regimen upon admission and as needed Assess ulceration(s) every visit Notes: Electronic Signature(s) Signed: 01/21/2019 4:25:13 PM By: Montey Hora Entered By: Montey Hora on 01/21/2019 10:33:48 Milius, [name] ([id number]) -------------------------------------------------------------------------------- Pain Assessment Details Patient Name: [name]. Date of Service: 01/21/2019 10:00 AM Medical Record Number: [id number] Patient Account Number: [id number] Date of Birth/Sex: [birth year] (85 y.o. F) Treating RN: Harold Barban Primary Care Shauntavia Brackin: Derinda Late Other Clinician: Referring Piccola Arico: BABAOFF, MARCUS Treating Nilson Tabora/Extender: STONE III, HOYT Weeks in Treatment: 1 Active Problems Location of Pain Severity and Description of Pain Patient Has Paino Yes Site Locations Pain Location: Pain in Ulcers Rate the pain. Current Pain Level: 5 Pain Management and Medication Current Pain Management: Electronic Signature(s) Signed: 01/21/2019 3:46:03 PM By: Harold Barban Entered By: Harold Barban on 01/21/2019 10:15:51 [name] ([id number]) -------------------------------------------------------------------------------- Patient/Caregiver Education Details Patient Name: [name] Date of Service: 01/21/2019 10:00 AM Medical Record Number: [id number] Patient Account Number: [id number] Date of Birth/Gender: 04-Apr-1926 (85 y.o. F) Treating RN: Montey Hora Primary Care Physician: Derinda Late Other Clinician: Referring Physician: BABAOFF, MARCUS Treating Physician/Extender: STONE III, HOYT Weeks  in Treatment: 1 Education Assessment Education Provided To: Patient and Caregiver Education Topics Provided Wound/Skin Impairment: Handouts: Other: wound care as ordered Methods: Demonstration, Explain/Verbal Responses: State content correctly Electronic Signature(s) Signed: 01/21/2019  4:25:13 PM By: Montey Hora Entered By: Montey Hora on 01/21/2019 10:42:51 Raggio, [name] ([id number]) -------------------------------------------------------------------------------- Wound Assessment Details Patient Name: Thomes Lolling C. Date of Service: 01/21/2019 10:00 AM Medical Record Number: [id number] Patient Account Number: [id number] Date of Birth/Sex: Jul 31, 1926 (85 y.o. F) Treating RN: Harold Barban Primary Care Gustavus Haskin: Derinda Late Other Clinician: Referring Boyde Grieco: BABAOFF, MARCUS Treating Yu Peggs/Extender: STONE III, HOYT Weeks in Treatment: 1 Wound Status Wound Number: 2 Primary Trauma, Other Etiology: Wound Location: Left Lower Leg - Anterior Wound Status: Open Wounding Event: Trauma Comorbid Cataracts, Hypertension, Osteoarthritis, Date Acquired: 01/07/2019 History: Dementia Weeks Of Treatment: 1 Clustered Wound: No Photos Wound Measurements Length: (cm) 2.2 Width: (cm) 2 Depth: (cm) 0.1 Area: (cm) 3.456 Volume: (cm) 0.346 % Reduction in Area: 8.3% % Reduction in Volume: 8.2% Tunneling: No Undermining: No Wound Description Classification: Partial Thickness Foul Odo Wound Margin: Flat and Intact Slough/F Exudate Amount: None Present r After Cleansing: No ibrino No Wound Bed Granulation Amount: None Present (0%) Exposed Structure Necrotic Amount: Large (67-100%) Fascia Exposed: No Necrotic Quality: Eschar Fat Layer (Subcutaneous Tissue) Exposed: No Tendon Exposed: No Muscle Exposed: No Joint Exposed: No Bone Exposed: No Limited to Skin Breakdown Periwound Skin Texture Texture Color No Abnormalities Noted: No No Abnormalities Noted: No Carbonell, Sharena C. ([id number]) Callus: No Atrophie Blanche: No Crepitus: No Cyanosis: No Excoriation: No Ecchymosis: No Induration: No Erythema: No Rash: No Hemosiderin Staining: No Scarring: No Mottled: No Pallor: No Moisture Rubor: No No Abnormalities Noted: No Dry / Scaly:  No Temperature / Pain Maceration: No Temperature: No Abnormality Tenderness on Palpation: Yes Wound Preparation Ulcer Cleansing: Rinsed/Irrigated with Saline Topical Anesthetic Applied: Other: lidocaine 4%, Treatment Notes Wound #2 (Left, Anterior Lower Leg) Notes santyl, mepitel, ABD, conform, tubi grip Electronic Signature(s) Signed: 01/21/2019 3:46:03 PM By: Harold Barban Entered By: Harold Barban on 01/21/2019 10:22:41 Aaron, [name] ([id number]) -------------------------------------------------------------------------------- Vitals Details Patient Name: [name]. Date of Service: 01/21/2019 10:00 AM Medical Record Number: [id number] Patient Account Number: [id number] Date of Birth/Sex: 1926-01-23 (85 y.o. F) Treating RN: Harold Barban Primary Care Jennise Both: BABAOFF, MARCUS Other Clinician: Referring Sheleen Conchas: BABAOFF, MARCUS Treating Britany Callicott/Extender: STONE III, HOYT Weeks in Treatment: 1 Vital Signs Time Taken: 10:15 Temperature (F): 98.6 Height (in): 64 Pulse (bpm): 72 Weight (lbs): 120 Respiratory Rate (breaths/min): 18 Body Mass Index (BMI): 20.6 Blood Pressure (mmHg): 145/85 Reference Range: 80 - 120 mg / dl Electronic Signature(s) Signed: 01/21/2019 3:46:03 PM By: Harold Barban Entered By: Harold Barban on 01/21/2019 10:16:10

## 2019-01-22 NOTE — Progress Notes (Signed)
[name], [name] ([id number]) Visit Report for 01/21/2019 Chief Complaint Document Details [name]. Date of Service: 01/21/2019 10:00 AM Medical Record Number: [id number] Patient Account Number: [id number] Date of Birth/Sex: [birth year] (85 y.o. F) Treating RN: Montey Hora Primary Care Provider: Derinda Late Other Clinician: Referring Provider: BABAOFF, MARCUS Treating Provider/Extender: Melburn Hake, Obelia Bonello Weeks in Treatment: 1 Information Obtained from: Patient Chief Complaint Left anterior shin ulcer Electronic Signature(s) Signed: 01/21/2019 8:26:01 PM By: Worthy Keeler PA-C Entered By: Worthy Keeler on 01/21/2019 10:12:37 [name] ([id number]) -------------------------------------------------------------------------------- Debridement Details [name]. Date of Service: 01/21/2019 10:00 AM Medical Record Number: [id number] Patient Account Number: [id number] Date of Birth/Sex: [birth year] (85 y.o. F) Treating RN: Montey Hora Primary Care Provider: BABAOFF, MARCUS Other Clinician: Referring Provider: BABAOFF, MARCUS Treating Provider/Extender: STONE III, Nikki Rusnak Weeks in Treatment: 1 Debridement Performed for Wound #2 Left,Anterior Lower Leg Assessment: Performed By: Clinician Montey Hora, RN Debridement Type: Chemical/Enzymatic/Mechanical Agent Used: Santyl Level of Consciousness (Pre- Awake and Alert procedure): Pre-procedure Verification/Time Yes - 10:41 Out Taken: Start Time: 10:41 Pain Control: Lidocaine 4% Topical Solution Instrument: Other : tongue depressor Bleeding: None End Time: 10:42 Procedural Pain: 0 Post Procedural Pain: 0 Response to Treatment: Procedure was tolerated well Level of Consciousness Awake and Alert (Post-procedure): Post Debridement Measurements of Total Wound Length: (cm) 2.2 Width: (cm) 2 Depth: (cm) 0.1 Volume: (cm) 0.346 Character of Wound/Ulcer Post Debridement:  Requires Further Debridement Post Procedure Diagnosis Same as Pre-procedure Electronic Signature(s) Signed: 01/21/2019 4:25:13 PM By: Montey Hora Signed: 01/21/2019 8:26:01 PM By: Worthy Keeler PA-C Entered By: Montey Hora on 01/21/2019 10:42:27 [name] ([id number]) -------------------------------------------------------------------------------- HPI Details [name]. Date of Service: 01/21/2019 10:00 AM Medical Record Number: [id number] Patient Account Number: [id number] Date of Birth/Sex: Aug 06, 1926 (85 y.o. F) Treating RN: Montey Hora Primary Care Provider: Derinda Late Other Clinician: Referring Provider: BABAOFF, MARCUS Treating Provider/Extender: Melburn Hake, Barbaraann Avans Weeks in Treatment: 1 History of Present Illness HPI Description: 06/06/17 on evaluation today patient presents for evaluation concerning an ulcer which she initially had arise as a result of striking her left lower extremity money bed frame. With that being said this was roughly one month ago and unfortunately the wound despite two courses of Keflex have not really made a dramatic improvement. This has continued to drain as well as being exquisitely tender at times. Even to the point that she is having a difficult time walking. Patient does have chronic atrial fibrillation which subsequently leads to her being on long-term anticoagulant therapy and this causes ED bruising which may have contributed to this entry and where things are at this point. She does have valvular heart disease as well as hypertension and her last INR was 2.7. Currently Neosporin, Gauls, and an ace wrap has been utilized. Keflex is the antibiotic that she has been on. Patient has not had a culture up to this point and she states that her blood pressure at the primary care office yesterday was 138/72. Her pain is significant related to be an eight out of 10. No fevers, chills, nausea, or vomiting noted at this  time. 06/20/17; patient was admitted here 2 weeks ago. She had a traumatic wound on her left lateral lower leg probably in the setting of some degree of venous insufficiency with surrounding cellulitis. She had been on Keflex, after we saw her she was put on doxycycline which she apparently did not tolerate. Culture that was done at the  time showed Morganella which was resistant to Keflex. Apparently the patient continued to take Keflex after the appointment. She is also on Coumadin. I had tried to change her antibiotic when the Morganella culture was brought to my attention however apparently our staff could not reach the patient to inform them of the antibiotic change 06/26/17; patient has wound on her left lateral lower extremity in the setting of some degree of venous insufficiency. I gave her cefdinir last week and she is completing this. There is no evidence of surrounding infection. Aggressive debridement last week, wound bed looks healthier. We've been using Aquacel. They're traveling in the [place] next week we'll see her back in 2 weeks unless there are problems. 07/10/17; the patient is been using Aquacel Ag her husband is changing this with Kerlix and conform. She still complains of a lot of pain. Apparently with the antibiotics. We gave 2 or 3 weeks ago. Her INR went up to 6, this was managed by her primary physician 07/17/17; she is using Aquacel Ag and a border foam. Still discomfort although dimensions are better. 07/24/17; patient or for review of a trauma wound on her left anterior leg in the setting of some degree of chronic venous insufficiency she has been using Aquacel Ag and a border foam and making progress. 07/31/17; only a small open area of this original significant trauma is still open. Her husband is changing this every second day [Aquacel Ag] Readmission: 01/14/19 on evaluation today patient is that she seen for initial inspection during the office visit today concerning a  trauma/skin tear to the left anterior lower extremity. This is roughly the same region where she was previously treated and years past when she was here in the clinic. Nonetheless upon evaluation today the patient's wound actually showed signs of decent granulation around the edge although unfortunately the skin flap that was torn back did fold under on itself and the flap itself is dying. She does have some eschar surrounded the edges of the wound at this point. Nonetheless this also was very tender for her. The patient does have a history of hypertension, atrial fibrillation, and long-term use anticoagulant therapy. 01/21/19 on evaluation today patient's wound on the anterior portion of her lower extremity appears to still be necrotic as far as the surface of the wound is concerned. Unfortunately there is no significant improvement at this point compared to last week they were not able to get the Santyl that are using Medihoney and maybe one reason why. The other is I think this may be stained to drive. Possibly adding something such as mepitel over top of the Medihoney may help to loosen this up quite a bit which I think would be in her best interest. Also she still has a lot of erythema and is very tender to touch I'm afraid that we may need to switch to a different antibiotic to see if this will be of benefit there still really nothing that I can culture to get a better picture of what's going on and what we need to do from the standpoint of antibiotics. I do believe the patient needs a referral Talmuds me to vascular in order to evaluate her blood flow based on what I'm seeing they will be able to perform TBI Scherr, Rajah C. ([id number]) testing if nothing else along with the vascular evaluation to see if there's any evidence for limited blood flow to this lower extremity. Electronic Signature(s) Signed: 01/21/2019 8:26:01 PM By: Worthy Keeler  PA-C Entered By: Worthy Keeler on 01/21/2019  10:45:38 [name] ([id number]) -------------------------------------------------------------------------------- Physical Exam Details Patient Name: MADALEINE, SIMMON. Date of Service: 01/21/2019 10:00 AM Medical Record Number: [id number] Patient Account Number: [id number] Date of Birth/Sex: Aug 15, 1926 (85 y.o. F) Treating RN: Montey Hora Primary Care Provider: Derinda Late Other Clinician: Referring Provider: BABAOFF, MARCUS Treating Provider/Extender: STONE III, Danielle Lento Weeks in Treatment: 1 Constitutional Well-nourished and well-hydrated in no acute distress. Respiratory normal breathing without difficulty. clear to auscultation bilaterally. Cardiovascular regular rate and rhythm with normal S1, S2. Psychiatric this patient is able to make decisions and demonstrates good insight into disease process. Alert and Oriented x 3. pleasant and cooperative. Notes Upon inspection today patient's wound bed is completely eschar covering doesn't really seem to have loosened up much since last week. Unfortunately she is having significant discomfort as well she does have some edema I am concerned that this is not seeming to show signs of improvement at this point. Overall I think we need to try to do something to help to keep this area more moist so that the Medihoney has any chance at all it hoping to loosen this up. Electronic Signature(s) Signed: 01/21/2019 8:26:01 PM By: Worthy Keeler PA-C Entered By: Worthy Keeler on 01/21/2019 10:46:28 [name] ([id number]) -------------------------------------------------------------------------------- Physician Orders Details [name] Date of Service: 01/21/2019 10:00 AM Medical Record Number: [id number] Patient Account Number: [id number] Date of Birth/Sex: [birth year] (85 y.o. F) Treating RN: Montey Hora Primary Care Provider: BABAOFF, MARCUS Other Clinician: Referring Provider: BABAOFF,  MARCUS Treating Provider/Extender: STONE III, Deigo Alonso Weeks in Treatment: 1 Verbal / Phone Orders: No Diagnosis Coding ICD-10 Coding Code Description S81.802A Unspecified open wound, left lower leg, initial encounter L97.822 Non-pressure chronic ulcer of other part of left lower leg with fat layer exposed I48.0 Paroxysmal atrial fibrillation Z79.01 Long term (current) use of anticoagulants I10 Essential (primary) hypertension Wound Cleansing Wound #2 Left,Anterior Lower Leg o Clean wound with Normal Saline. o Cleanse wound with mild soap and water o May Shower, gently pat wound dry prior to applying new dressing. Anesthetic (add to Medication List) Wound #2 Left,Anterior Lower Leg o Topical Lidocaine 4% cream applied to wound bed prior to debridement (In Clinic Only). Primary Wound Dressing Wound #2 Left,Anterior Lower Leg o Mepitel One Contact layer o Santyl Ointment - in clinic o Medihoney gel - at home Secondary Dressing Wound #2 Left,Anterior Lower Leg o ABD and Kerlix/Conform Dressing Change Frequency Wound #2 Left,Anterior Lower Leg o Change dressing every day. Follow-up Appointments Wound #2 Left,Anterior Lower Leg o Return Appointment in 1 week. Edema Control Wound #2 Left,Anterior Lower Leg o Elevate legs to the level of the heart and pump ankles as often as possible o Other: - Tubigrip F Hemann, Langley C. ([id number]) Medications-please add to medication list. Wound #2 Left,Anterior Lower Leg o Santyl Enzymatic Ointment Services and Therapies o Arterial Studies- Bilateral Patient Medications Allergies: No Known Drug Allergies Notifications Medication Indication Start End Bactrim DS 01/21/2019 DOSE 1 - oral 800 mg-160 mg tablet - 1 tablet oral taken 2 times a day for 7 days. Please stop taking the Cephalexin Electronic Signature(s) Signed: 01/21/2019 11:25:44 AM By: Worthy Keeler PA-C Entered By: Worthy Keeler on 01/21/2019  11:25:43 Sitzman, Lawerance [name] ([id number]) -------------------------------------------------------------------------------- Problem List Details Patient Name: DAYLEN, HACK. Date of Service: 01/21/2019 10:00 AM Medical Record Number: [id number] Patient Account Number: [id number] Date of Birth/Sex: November 13, 1925 (85 y.o. F) Treating RN: Montey Hora  Primary Care Provider: BABAOFF, MARCUS Other Clinician: Referring Provider: BABAOFF, MARCUS Treating Provider/Extender: STONE III, Jerol Rufener Weeks in Treatment: 1 Active Problems ICD-10 Evaluated Encounter Code Description Active Date Today Diagnosis S81.802A Unspecified open wound, left lower leg, initial encounter 01/14/2019 No Yes L97.822 Non-pressure chronic ulcer of other part of left lower leg with 01/14/2019 No Yes fat layer exposed I48.0 Paroxysmal atrial fibrillation 01/14/2019 No Yes Z79.01 Long term (current) use of anticoagulants 01/14/2019 No Yes I10 Essential (primary) hypertension 01/14/2019 No Yes Inactive Problems Resolved Problems Electronic Signature(s) Signed: 01/21/2019 8:26:01 PM By: Worthy Keeler PA-C Entered By: Worthy Keeler on 01/21/2019 10:12:32 [name] ([id number]) -------------------------------------------------------------------------------- Progress Note Details [name]. Date of Service: 01/21/2019 10:00 AM Medical Record Number: [id number] Patient Account Number: [id number] Date of Birth/Sex: 1925/12/30 (85 y.o. F) Treating RN: Montey Hora Primary Care Provider: Derinda Late Other Clinician: Referring Provider: BABAOFF, MARCUS Treating Provider/Extender: Melburn Hake, Pellegrino Kennard Weeks in Treatment: 1 Subjective Chief Complaint Information obtained from Patient Left anterior shin ulcer History of Present Illness (HPI) 06/06/17 on evaluation today patient presents for evaluation concerning an ulcer which she initially had arise as a result of striking her left lower extremity  money bed frame. With that being said this was roughly one month ago and unfortunately the wound despite two courses of Keflex have not really made a dramatic improvement. This has continued to drain as well as being exquisitely tender at times. Even to the point that she is having a difficult time walking. Patient does have chronic atrial fibrillation which subsequently leads to her being on long-term anticoagulant therapy and this causes ED bruising which may have contributed to this entry and where things are at this point. She does have valvular heart disease as well as hypertension and her last INR was 2.7. Currently Neosporin, Gauls, and an ace wrap has been utilized. Keflex is the antibiotic that she has been on. Patient has not had a culture up to this point and she states that her blood pressure at the primary care office yesterday was 138/72. Her pain is significant related to be an eight out of 10. No fevers, chills, nausea, or vomiting noted at this time. 06/20/17; patient was admitted here 2 weeks ago. She had a traumatic wound on her left lateral lower leg probably in the setting of some degree of venous insufficiency with surrounding cellulitis. She had been on Keflex, after we saw her she was put on doxycycline which she apparently did not tolerate. Culture that was done at the time showed Morganella which was resistant to Keflex. Apparently the patient continued to take Keflex after the appointment. She is also on Coumadin. I had tried to change her antibiotic when the Morganella culture was brought to my attention however apparently our staff could not reach the patient to inform them of the antibiotic change 06/26/17; patient has wound on her left lateral lower extremity in the setting of some degree of venous insufficiency. I gave her cefdinir last week and she is completing this. There is no evidence of surrounding infection. Aggressive debridement last week, wound bed looks  healthier. We've been using Aquacel. They're traveling in the [place] next week we'll see her back in 2 weeks unless there are problems. 07/10/17; the patient is been using Aquacel Ag her husband is changing this with Kerlix and conform. She still complains of a lot of pain. Apparently with the antibiotics. We gave 2 or 3 weeks ago. Her INR went up  to 6, this was managed by her primary physician 07/17/17; she is using Aquacel Ag and a border foam. Still discomfort although dimensions are better. 07/24/17; patient or for review of a trauma wound on her left anterior leg in the setting of some degree of chronic venous insufficiency she has been using Aquacel Ag and a border foam and making progress. 07/31/17; only a small open area of this original significant trauma is still open. Her husband is changing this every second day [Aquacel Ag] Readmission: 01/14/19 on evaluation today patient is that she seen for initial inspection during the office visit today concerning a trauma/skin tear to the left anterior lower extremity. This is roughly the same region where she was previously treated and years past when she was here in the clinic. Nonetheless upon evaluation today the patient's wound actually showed signs of decent granulation around the edge although unfortunately the skin flap that was torn back did fold under on itself and the flap itself is dying. She does have some eschar surrounded the edges of the wound at this point. Nonetheless this also was very tender for her. The patient does have a history of hypertension, atrial fibrillation, and long-term use anticoagulant therapy. 01/21/19 on evaluation today patient's wound on the anterior portion of her lower extremity appears to still be necrotic as far as the surface of the wound is concerned. Unfortunately there is no significant improvement at this point compared to last week they were not able to get the Santyl that are using Medihoney and  maybe one reason why. The other is I think this may be Killion, Lala C. ([id number]) stained to drive. Possibly adding something such as mepitel over top of the Medihoney may help to loosen this up quite a bit which I think would be in her best interest. Also she still has a lot of erythema and is very tender to touch I'm afraid that we may need to switch to a different antibiotic to see if this will be of benefit there still really nothing that I can culture to get a better picture of what's going on and what we need to do from the standpoint of antibiotics. I do believe the patient needs a referral Talmuds me to vascular in order to evaluate her blood flow based on what I'm seeing they will be able to perform TBI testing if nothing else along with the vascular evaluation to see if there's any evidence for limited blood flow to this lower extremity. Patient History Information obtained from Patient. Family History Cancer - Siblings, Hypertension - Father,Siblings, Stroke - Father, No family history of Diabetes, Heart Disease, Kidney Disease, Lung Disease, Seizures, Thyroid Problems, Tuberculosis. Social History Former smoker, Marital Status - Married, Alcohol Use - Daily, Drug Use - No History, Caffeine Use - Never. Medical History Eyes Patient has history of Cataracts - removed Denies history of Glaucoma, Optic Neuritis Ear/Nose/Mouth/Throat Denies history of Chronic sinus problems/congestion Hematologic/Lymphatic Denies history of Anemia, Hemophilia, Human Immunodeficiency Virus, Lymphedema, Sickle Cell Disease Respiratory Denies history of Aspiration, Asthma, Chronic Obstructive Pulmonary Disease (COPD), Pneumothorax, Sleep Apnea, Tuberculosis Cardiovascular Patient has history of Hypertension Denies history of Angina, Arrhythmia, Congestive Heart Failure, Coronary Artery Disease, Deep Vein Thrombosis, Hypotension, Myocardial Infarction, Peripheral Arterial Disease, Peripheral  Venous Disease, Phlebitis, Vasculitis Gastrointestinal Denies history of Cirrhosis , Colitis, Crohn s, Hepatitis A, Hepatitis B, Hepatitis C Endocrine Denies history of Type I Diabetes, Type II Diabetes Genitourinary Denies history of End Stage Renal Disease Immunological Denies history  of Lupus Erythematosus, Raynaud s, Scleroderma Integumentary (Skin) Denies history of History of Burn, History of pressure wounds Musculoskeletal Patient has history of Osteoarthritis - hands Denies history of Gout, Rheumatoid Arthritis, Osteomyelitis Neurologic Patient has history of Dementia Denies history of Neuropathy, Quadriplegia, Paraplegia, Seizure Disorder Oncologic Denies history of Received Chemotherapy, Received Radiation Medical And Surgical History Notes Constitutional Symptoms (General Health) A-Fib; HTN; Valvular heart disease Review of Systems (ROS) Constitutional Symptoms (General Health) Denies complaints or symptoms of Fever, Chills. EVAGELIA, KNACK ([id number]) Respiratory The patient has no complaints or symptoms. Cardiovascular Complains or has symptoms of LE edema. Psychiatric The patient has no complaints or symptoms. Objective Constitutional Well-nourished and well-hydrated in no acute distress. Vitals Time Taken: 10:15 AM, Height: 64 in, Weight: 120 lbs, BMI: 20.6, Temperature: 98.6 F, Pulse: 72 bpm, Respiratory Rate: 18 breaths/min, Blood Pressure: 145/85 mmHg. Respiratory normal breathing without difficulty. clear to auscultation bilaterally. Cardiovascular regular rate and rhythm with normal S1, S2. Psychiatric this patient is able to make decisions and demonstrates good insight into disease process. Alert and Oriented x 3. pleasant and cooperative. General Notes: Upon inspection today patient's wound bed is completely eschar covering doesn't really seem to have loosened up much since last week. Unfortunately she is having significant discomfort as well  she does have some edema I am concerned that this is not seeming to show signs of improvement at this point. Overall I think we need to try to do something to help to keep this area more moist so that the Medihoney has any chance at all it hoping to loosen this up. Integumentary (Hair, Skin) Wound #2 status is Open. Original cause of wound was Trauma. The wound is located on the Left,Anterior Lower Leg. The wound measures 2.2cm length x 2cm width x 0.1cm depth; 3.456cm^2 area and 0.346cm^3 volume. The wound is limited to skin breakdown. There is no tunneling or undermining noted. There is a none present amount of drainage noted. The wound margin is flat and intact. There is no granulation within the wound bed. There is a large (67-100%) amount of necrotic tissue within the wound bed including Eschar. The periwound skin appearance did not exhibit: Callus, Crepitus, Excoriation, Induration, Rash, Scarring, Dry/Scaly, Maceration, Atrophie Blanche, Cyanosis, Ecchymosis, Hemosiderin Staining, Mottled, Pallor, Rubor, Erythema. Periwound temperature was noted as No Abnormality. The periwound has tenderness on palpation. Assessment Active Problems ICD-10 Unspecified open wound, left lower leg, initial encounter Non-pressure chronic ulcer of other part of left lower leg with fat layer exposed Leisner, Kayelee C. ([id number]) Paroxysmal atrial fibrillation Long term (current) use of anticoagulants Essential (primary) hypertension Procedures Wound #2 Pre-procedure diagnosis of Wound #2 is a Trauma, Other located on the Left,Anterior Lower Leg . There was a Chemical/Enzymatic/Mechanical debridement performed by Montey Hora, RN. With the following instrument(s): tongue depressor after achieving pain control using Lidocaine 4% Topical Solution. Agent used was [employer]. A time out was conducted at 10:41, prior to the start of the procedure. There was no bleeding. The procedure was tolerated well with a  pain level of 0 throughout and a pain level of 0 following the procedure. Post Debridement Measurements: 2.2cm length x 2cm width x 0.1cm depth; 0.346cm^3 volume. Character of Wound/Ulcer Post Debridement requires further debridement. Post procedure Diagnosis Wound #2: Same as Pre-Procedure Plan Wound Cleansing: Wound #2 Left,Anterior Lower Leg: Clean wound with Normal Saline. Cleanse wound with mild soap and water May Shower, gently pat wound dry prior to applying new dressing. Anesthetic (add to Medication List):  Wound #2 Left,Anterior Lower Leg: Topical Lidocaine 4% cream applied to wound bed prior to debridement (In Clinic Only). Primary Wound Dressing: Wound #2 Left,Anterior Lower Leg: Mepitel One Contact layer Santyl Ointment - in clinic Medihoney gel - at home Secondary Dressing: Wound #2 Left,Anterior Lower Leg: ABD and Kerlix/Conform Dressing Change Frequency: Wound #2 Left,Anterior Lower Leg: Change dressing every day. Follow-up Appointments: Wound #2 Left,Anterior Lower Leg: Return Appointment in 1 week. Edema Control: Wound #2 Left,Anterior Lower Leg: Elevate legs to the level of the heart and pump ankles as often as possible Other: - Tubigrip F Medications-please add to medication list.: Wound #2 Left,Anterior Lower Leg: Santyl Enzymatic Ointment Services and Therapies ordered were: DONATA, REDDICK ([id number]) Arterial Studies- Bilateral The following medication(s) was prescribed: Bactrim DS oral 800 mg-160 mg tablet 1 1 tablet oral taken 2 times a day for 7 days. Please stop taking the Cephalexin starting 01/21/2019 At this time my suggestion is gonna be that we continue with the Medihoney since the Santyl was going to cost of $250. They are in agreement with this plan. We will add applying mepitel over top of the Medihoney in order to help keep the moisture in place to hopefully loosen up the eschar so that any debridement necessary in the future will be  much easier to perform. The patient and her husband are in agreement with this plan. Subsequently I'm also going to referred the patient for vascular evaluation this would be arterial studies bilaterally with ABI and TBI for further evaluation to ensure that she has good blood flow sufficient to heal this area and sufficient to tolerate significant debridement. Lastly I am going to suggest as well that we schedule the patient for Dr. Dellia Nims next week on Wednesday as the patient and her husband have commented several times that they preferred seeing a physician over a 17 assistant. I am definitely okay with that and I have no worries with having her see Dr. Janalyn Rouse evaluation and to see if he has any other ideas or plans as well. We will otherwise see were things stand at follow-up. If anything changes or worsens in the meantime the patient will let me know otherwise Dr. Dellia Nims will see her next week. Please see above for specific wound care orders. We will see patient for re-evaluation in 1 week(s) here in the clinic. If anything worsens or changes patient will contact our office for additional recommendations. Electronic Signature(s) Signed: 01/21/2019 8:26:01 PM By: Worthy Keeler PA-C Entered By: Worthy Keeler on 01/21/2019 11:25:59 [name] ([id number]) -------------------------------------------------------------------------------- ROS/PFSH Details [name] Date of Service: 01/21/2019 10:00 AM Medical Record Number: [id number] Patient Account Number: [id number] Date of Birth/Sex: [birth year] (85 y.o. F) Treating RN: Montey Hora Primary Care Provider: BABAOFF, MARCUS Other Clinician: Referring Provider: BABAOFF, MARCUS Treating Provider/Extender: STONE III, Emanii Bugbee Weeks in Treatment: 1 Information Obtained From Patient Wound History Do you currently have one or more open woundso Yes How many open wounds do you currently haveo  1 Approximately how long have you had your woundso 1 How have you been treating your wound(s) until nowo gauze Has your wound(s) ever healed and then re-openedo No Have you had any lab work done in the past montho No Have you tested positive for osteomyelitis (bone infection)o No Have you had any tests for circulation on your legso No Constitutional Symptoms (General Health) Complaints and Symptoms: Negative for: Fever; Chills Medical History: Past Medical History Notes: A-Fib; HTN; Valvular heart  disease Cardiovascular Complaints and Symptoms: Positive for: LE edema Medical History: Positive for: Hypertension Negative for: Angina; Arrhythmia; Congestive Heart Failure; Coronary Artery Disease; Deep Vein Thrombosis; Hypotension; Myocardial Infarction; Peripheral Arterial Disease; Peripheral Venous Disease; Phlebitis; Vasculitis Eyes Medical History: Positive for: Cataracts - removed Negative for: Glaucoma; Optic Neuritis Ear/Nose/Mouth/Throat Medical History: Negative for: Chronic sinus problems/congestion Hematologic/Lymphatic Medical History: Negative for: Anemia; Hemophilia; Human Immunodeficiency Virus; Lymphedema; Sickle Cell Disease Respiratory Garland, Haylie C. ([id number]) Complaints and Symptoms: No Complaints or Symptoms Medical History: Negative for: Aspiration; Asthma; Chronic Obstructive Pulmonary Disease (COPD); Pneumothorax; Sleep Apnea; Tuberculosis Gastrointestinal Medical History: Negative for: Cirrhosis ; Colitis; Crohnos; Hepatitis A; Hepatitis B; Hepatitis C Endocrine Medical History: Negative for: Type I Diabetes; Type II Diabetes Genitourinary Medical History: Negative for: End Stage Renal Disease Immunological Medical History: Negative for: Lupus Erythematosus; Raynaudos; Scleroderma Integumentary (Skin) Medical History: Negative for: History of Burn; History of pressure wounds Musculoskeletal Medical History: Positive for: Osteoarthritis -  hands Negative for: Gout; Rheumatoid Arthritis; Osteomyelitis Neurologic Medical History: Positive for: Dementia Negative for: Neuropathy; Quadriplegia; Paraplegia; Seizure Disorder Oncologic Medical History: Negative for: Received Chemotherapy; Received Radiation Psychiatric Complaints and Symptoms: No Complaints or Symptoms HBO Extended History Items Eyes: Cataracts Immunizations HERAN, CAMPAU ([id number]) Pneumococcal Vaccine: Received Pneumococcal Vaccination: Yes Implantable Devices None Family and Social History Cancer: Yes - Siblings; Diabetes: No; Heart Disease: No; Hypertension: Yes - Father,Siblings; Kidney Disease: No; Lung Disease: No; Seizures: No; Stroke: Yes - Father; Thyroid Problems: No; Tuberculosis: No; Former smoker; Marital Status - Married; Alcohol Use: Daily; Drug Use: No History; Caffeine Use: Never; Financial Concerns: No; Food, Clothing or Shelter Needs: No; Support System Lacking: No; Transportation Concerns: No; Advanced Directives: Yes (Not Provided); Patient does not want information on Advanced Directives; Living Will: Yes (Copy provided); Medical Power of Attorney: Yes (Not Provided) Physician Affirmation I have reviewed and agree with the above information. Electronic Signature(s) Signed: 01/21/2019 4:25:13 PM By: Montey Hora Signed: 01/21/2019 8:26:01 PM By: Worthy Keeler PA-C Entered By: Worthy Keeler on 01/21/2019 10:45:54 Dodds, Lawerance [name] ([id number]) -------------------------------------------------------------------------------- SuperBill Details [name] Date of Service: 01/21/2019 Medical Record Number: [id number] Patient Account Number: [id number] Date of Birth/Sex: [birth year] (85 y.o. F) Treating RN: Montey Hora Primary Care Provider: BABAOFF, MARCUS Other Clinician: Referring Provider: BABAOFF, MARCUS Treating Provider/Extender: STONE III, Pernie Grosso Weeks in Treatment: 1 Diagnosis Coding ICD-10  Codes Code Description S81.802A Unspecified open wound, left lower leg, initial encounter L97.822 Non-pressure chronic ulcer of other part of left lower leg with fat layer exposed I48.0 Paroxysmal atrial fibrillation Z79.01 Long term (current) use of anticoagulants I10 Essential (primary) hypertension Facility Procedures CPT4 Code: 64403474 Description: [phone] - DEBRIDE W/O ANES NON SELECT Modifier: Quantity: 1 Physician Procedures CPT4 Code Description: [id number] - WC PHYS LEVEL 4 - EST PT ICD-10 Diagnosis Description S81.802A Unspecified open wound, left lower leg, initial encounter L97.822 Non-pressure chronic ulcer of other part of left lower leg wit I48.0 Paroxysmal atrial  fibrillation Z79.01 Long term (current) use of anticoagulants Modifier: h fat layer expos Quantity: 1 ed Electronic Signature(s) Signed: 01/21/2019 8:26:01 PM By: Worthy Keeler PA-C Entered By: Worthy Keeler on 01/21/2019 10:48:39

## 2019-01-23 ENCOUNTER — Ambulatory Visit (INDEPENDENT_AMBULATORY_CARE_PROVIDER_SITE_OTHER): Payer: Medicare Other

## 2019-01-23 ENCOUNTER — Other Ambulatory Visit (INDEPENDENT_AMBULATORY_CARE_PROVIDER_SITE_OTHER): Payer: Self-pay | Admitting: Physician Assistant

## 2019-01-23 ENCOUNTER — Other Ambulatory Visit (INDEPENDENT_AMBULATORY_CARE_PROVIDER_SITE_OTHER): Payer: Self-pay | Admitting: Vascular Surgery

## 2019-01-23 ENCOUNTER — Other Ambulatory Visit: Payer: Self-pay

## 2019-01-23 DIAGNOSIS — S91009A Unspecified open wound, unspecified ankle, initial encounter: Secondary | ICD-10-CM

## 2019-01-23 DIAGNOSIS — L97309 Non-pressure chronic ulcer of unspecified ankle with unspecified severity: Secondary | ICD-10-CM | POA: Diagnosis not present

## 2019-01-28 ENCOUNTER — Ambulatory Visit: Payer: Medicare Other | Admitting: Physician Assistant

## 2019-01-29 ENCOUNTER — Encounter: Payer: Medicare Other | Attending: Internal Medicine | Admitting: Internal Medicine

## 2019-01-29 ENCOUNTER — Other Ambulatory Visit: Payer: Self-pay

## 2019-01-29 DIAGNOSIS — Z7901 Long term (current) use of anticoagulants: Secondary | ICD-10-CM | POA: Diagnosis not present

## 2019-01-29 DIAGNOSIS — L97822 Non-pressure chronic ulcer of other part of left lower leg with fat layer exposed: Secondary | ICD-10-CM | POA: Diagnosis not present

## 2019-01-29 DIAGNOSIS — I10 Essential (primary) hypertension: Secondary | ICD-10-CM | POA: Diagnosis not present

## 2019-01-29 DIAGNOSIS — I482 Chronic atrial fibrillation, unspecified: Secondary | ICD-10-CM | POA: Insufficient documentation

## 2019-01-29 DIAGNOSIS — I872 Venous insufficiency (chronic) (peripheral): Secondary | ICD-10-CM | POA: Insufficient documentation

## 2019-01-29 NOTE — Progress Notes (Signed)
JAZZALYNN, RHUDY ([id number]) Visit Report for 01/29/2019 Arrival Information Details [name], [name]. Date of Service: 01/29/2019 1:30 PM Medical Record Number: [id number] Patient Account Number: [id number] Date of Birth/Sex: 09-27-1926 (85 y.o. F) Treating RN: Montey Hora Primary Care Reise Gladney: BABAOFF, MARCUS Other Clinician: Referring Marlise Fahr: BABAOFF, MARCUS Treating Annamae Shivley/Extender: Tito Dine in Treatment: 2 Visit Information History Since Last Visit Added or deleted any medications: No Patient Arrived: Ambulatory Any new allergies or adverse reactions: No Arrival Time: 13:41 Had a fall or experienced change in No Accompanied By: self activities of daily living that may affect Transfer Assistance: None risk of falls: Patient Identification Verified: Yes Signs or symptoms of abuse/neglect since last visito No Secondary Verification Process Yes Hospitalized since last visit: No Completed: Implantable device outside of the clinic excluding No Patient Has Alerts: Yes cellular tissue based products placed in the center Patient Alerts: NEED ABI - TOO since last visit: PAINFUL Has Dressing in Place as Prescribed: Yes Pain Present Now: No Electronic Signature(s) Signed: 01/29/2019 2:47:29 PM By: Montey Hora Entered By: Montey Hora on 01/29/2019 13:41:54 Spofford, [name] ([id number]) -------------------------------------------------------------------------------- Encounter Discharge Information Details Patient Name: [name]. Date of Service: 01/29/2019 1:30 PM Medical Record Number: [id number] Patient Account Number: [id number] Date of Birth/Sex: [birth year] (85 y.o. F) Treating RN: Cornell Barman Primary Care Ernestina Joe: Derinda Late Other Clinician: Referring Fe Okubo: BABAOFF, MARCUS Treating Zyere Jiminez/Extender: Tito Dine in Treatment: 2 Encounter Discharge Information Items Post Procedure Vitals Discharge  Condition: Stable Temperature (F): 98.3 Ambulatory Status: Ambulatory Pulse (bpm): 85 Discharge Destination: Home Respiratory Rate (breaths/min): 16 Transportation: Private Auto Blood Pressure (mmHg): 171/99 Accompanied By: husband Schedule Follow-up Appointment: Yes Clinical Summary of Care: Electronic Signature(s) Signed: 01/29/2019 4:47:28 PM By: Gretta Cool, BSN, RN, [name], Kim RN, BSN Entered By: Gretta Cool, BSN, RN, [name], Kim on 01/29/2019 14:30:42 [name] ([id number]) -------------------------------------------------------------------------------- Lower Extremity Assessment Details Patient Name: DEMEKA, SUTTER C. Date of Service: 01/29/2019 1:30 PM Medical Record Number: [id number] Patient Account Number: [id number] Date of Birth/Sex: [birth year] (85 y.o. F) Treating RN: Montey Hora Primary Care Cristine Daw: Derinda Late Other Clinician: Referring Zyniah Ferraiolo: BABAOFF, MARCUS Treating Alcide Memoli/Extender: Ricard Dillon Weeks in Treatment: 2 Edema Assessment Assessed: [Left: No] [Right: No] Edema: [Left: N] [Right: o] Vascular Assessment Pulses: Dorsalis Pedis Palpable: [Left:Yes] Electronic Signature(s) Signed: 01/29/2019 2:47:29 PM By: Montey Hora Entered By: Montey Hora on 01/29/2019 13:45:36 Shiraishi, [name] ([id number]) -------------------------------------------------------------------------------- Multi Wound Chart Details Patient Name: [name]. Date of Service: 01/29/2019 1:30 PM Medical Record Number: [id number] Patient Account Number: [id number] Date of Birth/Sex: 1926-06-10 (85 y.o. F) Treating RN: Cornell Barman Primary Care Shaquella Stamant: BABAOFF, MARCUS Other Clinician: Referring Domnick Chervenak: BABAOFF, MARCUS Treating Dharma Pare/Extender: Ricard Dillon Weeks in Treatment: 2 Vital Signs Height(in): 64 Pulse(bpm): 85 Weight(lbs): 120 Blood Pressure(mmHg): 171/99 Body Mass Index(BMI): 21 Temperature(F): 98.3 Respiratory  Rate 18 (breaths/min): Photos: [N/A:N/A] Wound Location: Left Lower Leg - Anterior N/A N/A Wounding Event: Trauma N/A N/A Primary Etiology: Trauma, Other N/A N/A Comorbid History: Cataracts, Hypertension, N/A N/A Osteoarthritis, Dementia Date Acquired: 01/07/2019 N/A N/A Weeks of Treatment: 2 N/A N/A Wound Status: Open N/A N/A Measurements L x W x D 2.1x1.9x0.1 N/A N/A (cm) Area (cm) : 3.134 N/A N/A Volume (cm) : 0.313 N/A N/A % Reduction in Area: 16.90% N/A N/A % Reduction in Volume: 17.00% N/A N/A Classification: Partial Thickness N/A N/A Exudate Amount: Small N/A N/A Exudate Type: Sanguinous N/A N/A Exudate Color: red N/A N/A Wound Margin: Flat and Intact  N/A N/A Granulation Amount: None Present (0%) N/A N/A Necrotic Amount: Large (67-100%) N/A N/A Necrotic Tissue: Eschar N/A N/A Exposed Structures: Fascia: No N/A N/A Fat Layer (Subcutaneous Tissue) Exposed: No Tendon: No Muscle: No Joint: No Veselka, Jahlisa C. ([id number]) Bone: No Limited to Skin Breakdown Epithelialization: None N/A N/A Debridement: Debridement - Excisional N/A N/A Pre-procedure 14:10 N/A N/A Verification/Time Out Taken: Pain Control: Lidocaine N/A N/A Tissue Debrided: Subcutaneous, Slough N/A N/A Level: Skin/Subcutaneous Tissue N/A N/A Debridement Area (sq cm): 3.99 N/A N/A Instrument: Curette N/A N/A Bleeding: Moderate N/A N/A Hemostasis Achieved: Pressure N/A N/A Debridement Treatment Procedure was tolerated well N/A N/A Response: Post Debridement 2.1x1.9x0.3 N/A N/A Measurements L x W x D (cm) Post Debridement Volume: 0.94 N/A N/A (cm) Periwound Skin Texture: Excoriation: No N/A N/A Induration: No Callus: No Crepitus: No Rash: No Scarring: No Periwound Skin Moisture: Maceration: No N/A N/A Dry/Scaly: No Periwound Skin Color: Atrophie Blanche: No N/A N/A Cyanosis: No Ecchymosis: No Erythema: No Hemosiderin Staining: No Mottled: No Pallor: No Rubor: No Temperature:  No Abnormality N/A N/A Tenderness on Palpation: Yes N/A N/A Procedures Performed: Debridement N/A N/A Treatment Notes Wound #2 (Left, Anterior Lower Leg) Notes santyl, mepitel, ABD, conform, tubi grip Electronic Signature(s) Signed: 01/29/2019 3:44:34 PM By: Linton Ham MD Entered By: Linton Ham on 01/29/2019 15:27:27 [name] ([id number]) -------------------------------------------------------------------------------- Multi-Disciplinary Care Plan Details Patient Name: [name]. Date of Service: 01/29/2019 1:30 PM Medical Record Number: [id number] Patient Account Number: [id number] Date of Birth/Sex: 1926-03-30 (85 y.o. F) Treating RN: Cornell Barman Primary Care Walther Sanagustin: Derinda Late Other Clinician: Referring Gean Larose: BABAOFF, MARCUS Treating Tranisha Tissue/Extender: Tito Dine in Treatment: 2 Active Inactive Abuse / Safety / Falls / Self Care Management Nursing Diagnoses: Potential for falls Goals: Patient will remain injury free related to falls Date Initiated: 01/14/2019 Target Resolution Date: 03/29/2019 Goal Status: Active Interventions: Assess fall risk on admission and as needed Notes: Necrotic Tissue Nursing Diagnoses: Impaired tissue integrity related to necrotic/devitalized tissue Goals: Patient/caregiver will verbalize understanding of reason and process for debridement of necrotic tissue Date Initiated: 01/21/2019 Target Resolution Date: 03/29/2019 Goal Status: Active Interventions: Provide education on necrotic tissue and debridement process Notes: Orientation to the Wound Care Program Nursing Diagnoses: Knowledge deficit related to the wound healing center program Goals: Patient/caregiver will verbalize understanding of the [facility] Date Initiated: 01/14/2019 Target Resolution Date: 03/29/2019 Goal Status: Active Interventions: Provide education on orientation to the wound center ROSALENA, MCCORRY.  ([id number]) Notes: Pain, Acute or Chronic Nursing Diagnoses: Pain, acute or chronic: actual or potential Goals: Patient will verbalize adequate pain control and receive pain control interventions during procedures as needed Date Initiated: 01/21/2019 Target Resolution Date: 03/29/2019 Goal Status: Active Interventions: Complete pain assessment as per visit requirements Notes: Wound/Skin Impairment Nursing Diagnoses: Impaired tissue integrity Goals: Ulcer/skin breakdown will heal within 14 weeks Date Initiated: 01/14/2019 Target Resolution Date: 03/29/2019 Goal Status: Active Interventions: Assess patient/caregiver ability to obtain necessary supplies Assess patient/caregiver ability to perform ulcer/skin care regimen upon admission and as needed Assess ulceration(s) every visit Notes: Electronic Signature(s) Signed: 01/29/2019 4:47:28 PM By: Gretta Cool, BSN, RN, [name], Kim RN, BSN Entered By: Gretta Cool, BSN, RN, [name], Kim on 01/29/2019 14:10:01 [name] ([id number]) -------------------------------------------------------------------------------- Pain Assessment Details Patient Name: [name]. Date of Service: 01/29/2019 1:30 PM Medical Record Number: [id number] Patient Account Number: [id number] Date of Birth/Sex: Feb 10, 1926 (85 y.o. F) Treating RN: Montey Hora Primary Care Raylinn Kosar: Derinda Late Other Clinician: Referring Miko Markwood: BABAOFF,  MARCUS Treating Keo Schirmer/Extender: Ricard Dillon Weeks in Treatment: 2 Active Problems Location of Pain Severity and Description of Pain Patient Has Paino Yes Site Locations Pain Location: Pain in Ulcers With Dressing Change: Yes Duration of the Pain. Constant / Intermittento Constant Pain Management and Medication Current Pain Management: Electronic Signature(s) Signed: 01/29/2019 2:47:29 PM By: Montey Hora Entered By: Montey Hora on 01/29/2019 13:42:06 [name]  ([id number]) -------------------------------------------------------------------------------- Patient/Caregiver Education Details Patient Name: [name] Date of Service: 01/29/2019 1:30 PM Medical Record Number: [id number] Patient Account Number: [id number] Date of Birth/Gender: 02/24/1926 (85 y.o. F) Treating RN: Cornell Barman Primary Care Physician: Derinda Late Other Clinician: Referring Physician: BABAOFF, MARCUS Treating Physician/Extender: Tito Dine in Treatment: 2 Education Assessment Education Provided To: Patient Education Topics Provided Wound Debridement: Handouts: Wound Debridement Methods: Demonstration, Explain/Verbal Responses: State content correctly Wound/Skin Impairment: Handouts: Caring for Your Ulcer Methods: Demonstration Responses: State content correctly Electronic Signature(s) Signed: 01/29/2019 4:47:28 PM By: Gretta Cool, BSN, RN, [name], Kim RN, BSN Entered By: Gretta Cool, BSN, RN, [name], Kim on 01/29/2019 14:15:48 [name] ([id number]) -------------------------------------------------------------------------------- Wound Assessment Details Patient Name: JOANELL, CRESSLER. Date of Service: 01/29/2019 1:30 PM Medical Record Number: [id number] Patient Account Number: [id number] Date of Birth/Sex: [birth year] (85 y.o. F) Treating RN: Montey Hora Primary Care Tamiya Colello: BABAOFF, MARCUS Other Clinician: Referring Jaelene Garciagarcia: BABAOFF, MARCUS Treating Brazos Sandoval/Extender: Ricard Dillon Weeks in Treatment: 2 Wound Status Wound Number: 2 Primary Trauma, Other Etiology: Wound Location: Left Lower Leg - Anterior Wound Status: Open Wounding Event: Trauma Comorbid Cataracts, Hypertension, Osteoarthritis, Date Acquired: 01/07/2019 History: Dementia Weeks Of Treatment: 2 Clustered Wound: No Photos Wound Measurements Length: (cm) 2.1 % Reduction Width: (cm) 1.9 % Reduction Depth: (cm) 0.1 Epithelializ Area: (cm) 3.134 Tunneling: Volume:  (cm) 0.313 Undermining in Area: 16.9% in Volume: 17% ation: None No : No Wound Description Classification: Partial Thickness Foul Odor A Wound Margin: Flat and Intact Slough/Fibr Exudate Amount: Small Exudate Type: Sanguinous Exudate Color: red fter Cleansing: No ino No Wound Bed Granulation Amount: None Present (0%) Exposed Structure Necrotic Amount: Large (67-100%) Fascia Exposed: No Necrotic Quality: Eschar Fat Layer (Subcutaneous Tissue) Exposed: No Tendon Exposed: No Muscle Exposed: No Joint Exposed: No Bone Exposed: No Limited to Skin Breakdown Periwound Skin Texture Davia, Miliana C. ([id number]) Texture Color No Abnormalities Noted: No No Abnormalities Noted: No Callus: No Atrophie Blanche: No Crepitus: No Cyanosis: No Excoriation: No Ecchymosis: No Induration: No Erythema: No Rash: No Hemosiderin Staining: No Scarring: No Mottled: No Pallor: No Moisture Rubor: No No Abnormalities Noted: No Dry / Scaly: No Temperature / Pain Maceration: No Temperature: No Abnormality Tenderness on Palpation: Yes Treatment Notes Wound #2 (Left, Anterior Lower Leg) Notes santyl, mepitel, ABD, conform, tubi grip Electronic Signature(s) Signed: 01/29/2019 2:47:29 PM By: Montey Hora Entered By: Montey Hora on 01/29/2019 13:46:47 [place]keby, [name] ([id number]) -------------------------------------------------------------------------------- Vitals Details Patient Name: [name]. Date of Service: 01/29/2019 1:30 PM Medical Record Number: [id number] Patient Account Number: [id number] Date of Birth/Sex: [birth year] (85 y.o. F) Treating RN: Montey Hora Primary Care Kallan Merrick: BABAOFF, MARCUS Other Clinician: Referring Lindon Kiel: BABAOFF, MARCUS Treating Ceniyah Thorp/Extender: Ricard Dillon Weeks in Treatment: 2 Vital Signs Time Taken: 13:42 Temperature (F): 98.3 Height (in): 64 Pulse (bpm): 85 Weight (lbs): 120 Respiratory Rate (breaths/min):  18 Body Mass Index (BMI): 20.6 Blood Pressure (mmHg): 171/99 Reference Range: 80 - 120 mg / dl Electronic Signature(s) Signed: 01/29/2019 2:47:29 PM By: Montey Hora Entered By: Montey Hora on 01/29/2019 13:42:26

## 2019-01-29 NOTE — Progress Notes (Signed)
AMELI, SANGIOVANNI ([id number]) Visit Report for 01/29/2019 Debridement Details [name], [name]. Date of Service: 01/29/2019 1:30 PM Medical Record Number: [id number] Patient Account Number: [id number] Date of Birth/Sex: Aug 14, 1926 (85 y.o. F) Treating RN: Cornell Barman Primary Care Provider: Derinda Late Other Clinician: Referring Provider: BABAOFF, MARCUS Treating Provider/Extender: Tito Dine in Treatment: 2 Debridement Performed for Wound #2 Left,Anterior Lower Leg Assessment: Performed By: Physician Ricard Dillon, MD Debridement Type: Debridement Level of Consciousness (Pre- Awake and Alert procedure): Pre-procedure Verification/Time Yes - 14:10 Out Taken: Start Time: 14:10 Pain Control: Lidocaine Total Area Debrided (L x W): 2.1 (cm) x 1.9 (cm) = 3.99 (cm) Tissue and other material Viable, Slough, Subcutaneous, Slough debrided: Level: Skin/Subcutaneous Tissue Debridement Description: Excisional Instrument: Curette Bleeding: Moderate Hemostasis Achieved: Pressure End Time: 14:13 Response to Treatment: Procedure was tolerated well Level of Consciousness Awake and Alert (Post-procedure): Post Debridement Measurements of Total Wound Length: (cm) 2.1 Width: (cm) 1.9 Depth: (cm) 0.3 Volume: (cm) 0.94 Character of Wound/Ulcer Post Debridement: Stable Post Procedure Diagnosis Same as Pre-procedure Electronic Signature(s) Signed: 01/29/2019 3:44:34 PM By: Linton Ham MD Signed: 01/29/2019 4:47:28 PM By: Gretta Cool, BSN, RN, [name], Kim RN, BSN Entered By: Linton Ham on 01/29/2019 15:27:39 Hallam, [name] ([id number]) -------------------------------------------------------------------------------- HPI Details [name]. Date of Service: 01/29/2019 1:30 PM Medical Record Number: [id number] Patient Account Number: [id number] Date of Birth/Sex: 26-Aug-1926 (85 y.o. F) Treating RN: Cornell Barman Primary Care Provider:  Derinda Late Other Clinician: Referring Provider: BABAOFF, MARCUS Treating Provider/Extender: Tito Dine in Treatment: 2 History of Present Illness HPI Description: 06/06/17 on evaluation today patient presents for evaluation concerning an ulcer which she initially had arise as a result of striking her left lower extremity money bed frame. With that being said this was roughly one month ago and unfortunately the wound despite two courses of Keflex have not really made a dramatic improvement. This has continued to drain as well as being exquisitely tender at times. Even to the point that she is having a difficult time walking. Patient does have chronic atrial fibrillation which subsequently leads to her being on long-term anticoagulant therapy and this causes ED bruising which may have contributed to this entry and where things are at this point. She does have valvular heart disease as well as hypertension and her last INR was 2.7. Currently Neosporin, Gauls, and an ace wrap has been utilized. Keflex is the antibiotic that she has been on. Patient has not had a culture up to this point and she states that her blood pressure at the primary care office yesterday was 138/72. Her pain is significant related to be an eight out of 10. No fevers, chills, nausea, or vomiting noted at this time. 06/20/17; patient was admitted here 2 weeks ago. She had a traumatic wound on her left lateral lower leg probably in the setting of some degree of venous insufficiency with surrounding cellulitis. She had been on Keflex, after we saw her she was put on doxycycline which she apparently did not tolerate. Culture that was done at the time showed Morganella which was resistant to Keflex. Apparently the patient continued to take Keflex after the appointment. She is also on Coumadin. I had tried to change her antibiotic when the Morganella culture was brought to my attention however apparently our staff  could not reach the patient to inform them of the antibiotic change 06/26/17; patient has wound on her left lateral lower extremity in the setting of  some degree of venous insufficiency. I gave her cefdinir last week and she is completing this. There is no evidence of surrounding infection. Aggressive debridement last week, wound bed looks healthier. We've been using Aquacel. They're traveling in the [place] next week we'll see her back in 2 weeks unless there are problems. 07/10/17; the patient is been using Aquacel Ag her husband is changing this with Kerlix and conform. She still complains of a lot of pain. Apparently with the antibiotics. We gave 2 or 3 weeks ago. Her INR went up to 6, this was managed by her primary physician 07/17/17; she is using Aquacel Ag and a border foam. Still discomfort although dimensions are better. 07/24/17; patient or for review of a trauma wound on her left anterior leg in the setting of some degree of chronic venous insufficiency she has been using Aquacel Ag and a border foam and making progress. 07/31/17; only a small open area of this original significant trauma is still open. Her husband is changing this every second day [Aquacel Ag] Readmission: 01/14/19 on evaluation today patient is that she seen for initial inspection during the office visit today concerning a trauma/skin tear to the left anterior lower extremity. This is roughly the same region where she was previously treated and years past when she was here in the clinic. Nonetheless upon evaluation today the patient's wound actually showed signs of decent granulation around the edge although unfortunately the skin flap that was torn back did fold under on itself and the flap itself is dying. She does have some eschar surrounded the edges of the wound at this point. Nonetheless this also was very tender for her. The patient does have a history of hypertension, atrial fibrillation, and long-term use  anticoagulant therapy. 01/21/19 on evaluation today patient's wound on the anterior portion of her lower extremity appears to still be necrotic as far as the surface of the wound is concerned. Unfortunately there is no significant improvement at this point compared to last week they were not able to get the Santyl that are using Medihoney and maybe one reason why. The other is I think this may be stained to drive. Possibly adding something such as mepitel over top of the Medihoney may help to loosen this up quite a bit which I think would be in her best interest. Also she still has a lot of erythema and is very tender to touch I'm afraid that we may need to switch to a different antibiotic to see if this will be of benefit there still really nothing that I can culture to get a better picture of what's going on and what we need to do from the standpoint of antibiotics. I do believe the patient needs a referral Talmuds me to vascular in order to evaluate her blood flow based on what I'm seeing they will be able to perform TBI testing if nothing else along with the vascular evaluation to see if there's any evidence for limited blood flow to this lower Pedley, Latonyia C. ([id number]) extremity. 4/8; I have reviewed the patient's arterial studies and they are really quite good. There should be no arterial impediments to healing the wound on the right anterior tibial area. Electronic Signature(s) Signed: 01/29/2019 3:44:34 PM By: Linton Ham MD Entered By: Linton Ham on 01/29/2019 15:28:29 [name] ([id number]) -------------------------------------------------------------------------------- Physical Exam Details [name], [name]. Date of Service: 01/29/2019 1:30 PM Medical Record Number: [id number] Patient Account Number: [id number] Date of Birth/Sex: 02-15-1926 (85  y.o. F) Treating RN: Cornell Barman Primary Care Provider: Derinda Late Other Clinician: Referring Provider:  BABAOFF, MARCUS Treating Provider/Extender: Ricard Dillon Weeks in Treatment: 2 Constitutional Patient is hypertensive.. Pulse regular and within target range for patient.Marland Kitchen Respirations regular, non-labored and within target range.. Temperature is normal and within the target range for the patient.Marland Kitchen appears in no distress. Notes Wound examination; the wound is completely eschar covered. We used copious amounts of benzocaine with a #5 curette I was able to remove most of this. She did not tolerate this very well. There is no evidence of surrounding infection Electronic Signature(s) Signed: 01/29/2019 3:44:34 PM By: Linton Ham MD Entered By: Linton Ham on 01/29/2019 15:30:50 [name] ([id number]) -------------------------------------------------------------------------------- Physician Orders Details [name] Date of Service: 01/29/2019 1:30 PM Medical Record Number: [id number] Patient Account Number: [id number] Date of Birth/Sex: January 22, 1926 (85 y.o. F) Treating RN: Cornell Barman Primary Care Provider: Derinda Late Other Clinician: Referring Provider: BABAOFF, MARCUS Treating Provider/Extender: Tito Dine in Treatment: 2 Verbal / Phone Orders: No Diagnosis Coding Wound Cleansing Wound #2 Left,Anterior Lower Leg o Clean wound with Normal Saline. o Cleanse wound with mild soap and water o May Shower, gently pat wound dry prior to applying new dressing. Anesthetic (add to Medication List) Wound #2 Left,Anterior Lower Leg o Topical Lidocaine 4% cream applied to wound bed prior to debridement (In Clinic Only). Primary Wound Dressing Wound #2 Left,Anterior Lower Leg o Mepitel One Contact layer o Santyl Ointment - in clinic o Medihoney gel - at home Secondary Dressing Wound #2 Left,Anterior Lower Leg o ABD and Kerlix/Conform Dressing Change Frequency Wound #2 Left,Anterior Lower Leg o Change dressing every  day. Follow-up Appointments Wound #2 Left,Anterior Lower Leg o Return Appointment in 1 week. Edema Control Wound #2 Left,Anterior Lower Leg o Elevate legs to the level of the heart and pump ankles as often as possible o Other: - Tubigrip F Electronic Signature(s) Signed: 01/29/2019 3:44:34 PM By: Linton Ham MD Signed: 01/29/2019 4:47:28 PM By: Gretta Cool, BSN, RN, [name], Kim RN, BSN Entered By: Gretta Cool, BSN, RN, [name], Kim on 01/29/2019 14:19:48 [name] ([id number]) -------------------------------------------------------------------------------- Problem List Details [name], [name]. Date of Service: 01/29/2019 1:30 PM Medical Record Number: [id number] Patient Account Number: [id number] Date of Birth/Sex: 04-Mar-1926 (85 y.o. F) Treating RN: Cornell Barman Primary Care Provider: Derinda Late Other Clinician: Referring Provider: BABAOFF, MARCUS Treating Provider/Extender: Tito Dine in Treatment: 2 Active Problems ICD-10 Evaluated Encounter Code Description Active Date Today Diagnosis S81.802A Unspecified open wound, left lower leg, initial encounter 01/14/2019 No Yes L97.822 Non-pressure chronic ulcer of other part of left lower leg with 01/14/2019 No Yes fat layer exposed I48.0 Paroxysmal atrial fibrillation 01/14/2019 No Yes Z79.01 Long term (current) use of anticoagulants 01/14/2019 No Yes I10 Essential (primary) hypertension 01/14/2019 No Yes Inactive Problems Resolved Problems Electronic Signature(s) Signed: 01/29/2019 3:44:34 PM By: Linton Ham MD Entered By: Linton Ham on 01/29/2019 15:26:55 Beale, [name] ([id number]) -------------------------------------------------------------------------------- Progress Note Details [name]. Date of Service: 01/29/2019 1:30 PM Medical Record Number: [id number] Patient Account Number: [id number] Date of Birth/Sex: June 06, 1926 (85 y.o. F) Treating RN: Cornell Barman Primary Care  Provider: Derinda Late Other Clinician: Referring Provider: BABAOFF, MARCUS Treating Provider/Extender: Ricard Dillon Weeks in Treatment: 2 Subjective History of Present Illness (HPI) 06/06/17 on evaluation today patient presents for evaluation concerning an ulcer which she initially had arise as a result of striking her left lower extremity money  bed frame. With that being said this was roughly one month ago and unfortunately the wound despite two courses of Keflex have not really made a dramatic improvement. This has continued to drain as well as being exquisitely tender at times. Even to the point that she is having a difficult time walking. Patient does have chronic atrial fibrillation which subsequently leads to her being on long-term anticoagulant therapy and this causes ED bruising which may have contributed to this entry and where things are at this point. She does have valvular heart disease as well as hypertension and her last INR was 2.7. Currently Neosporin, Gauls, and an ace wrap has been utilized. Keflex is the antibiotic that she has been on. Patient has not had a culture up to this point and she states that her blood pressure at the primary care office yesterday was 138/72. Her pain is significant related to be an eight out of 10. No fevers, chills, nausea, or vomiting noted at this time. 06/20/17; patient was admitted here 2 weeks ago. She had a traumatic wound on her left lateral lower leg probably in the setting of some degree of venous insufficiency with surrounding cellulitis. She had been on Keflex, after we saw her she was put on doxycycline which she apparently did not tolerate. Culture that was done at the time showed Morganella which was resistant to Keflex. Apparently the patient continued to take Keflex after the appointment. She is also on Coumadin. I had tried to change her antibiotic when the Morganella culture was brought to my attention however apparently our  staff could not reach the patient to inform them of the antibiotic change 06/26/17; patient has wound on her left lateral lower extremity in the setting of some degree of venous insufficiency. I gave her cefdinir last week and she is completing this. There is no evidence of surrounding infection. Aggressive debridement last week, wound bed looks healthier. We've been using Aquacel. They're traveling in the [place] next week we'll see her back in 2 weeks unless there are problems. 07/10/17; the patient is been using Aquacel Ag her husband is changing this with Kerlix and conform. She still complains of a lot of pain. Apparently with the antibiotics. We gave 2 or 3 weeks ago. Her INR went up to 6, this was managed by her primary physician 07/17/17; she is using Aquacel Ag and a border foam. Still discomfort although dimensions are better. 07/24/17; patient or for review of a trauma wound on her left anterior leg in the setting of some degree of chronic venous insufficiency she has been using Aquacel Ag and a border foam and making progress. 07/31/17; only a small open area of this original significant trauma is still open. Her husband is changing this every second day [Aquacel Ag] Readmission: 01/14/19 on evaluation today patient is that she seen for initial inspection during the office visit today concerning a trauma/skin tear to the left anterior lower extremity. This is roughly the same region where she was previously treated and years past when she was here in the clinic. Nonetheless upon evaluation today the patient's wound actually showed signs of decent granulation around the edge although unfortunately the skin flap that was torn back did fold under on itself and the flap itself is dying. She does have some eschar surrounded the edges of the wound at this point. Nonetheless this also was very tender for her. The patient does have a history of hypertension, atrial fibrillation, and long-term use  anticoagulant therapy.  01/21/19 on evaluation today patient's wound on the anterior portion of her lower extremity appears to still be necrotic as far as the surface of the wound is concerned. Unfortunately there is no significant improvement at this point compared to last week they were not able to get the Santyl that are using Medihoney and maybe one reason why. The other is I think this may be stained to drive. Possibly adding something such as mepitel over top of the Medihoney may help to loosen this up quite a bit which I think would be in her best interest. Also she still has a lot of erythema and is very tender to touch I'm afraid that we may need to switch to a different antibiotic to see if this will be of benefit there still really nothing that I can culture to get a better picture of what's going on and what we need to do from the standpoint of antibiotics. I do believe the patient needs a referral Talmuds me to vascular in order to evaluate her blood flow based on what I'm seeing they will be able to perform TBI Angerer, Kimarie C. ([id number]) testing if nothing else along with the vascular evaluation to see if there's any evidence for limited blood flow to this lower extremity. 4/8; I have reviewed the patient's arterial studies and they are really quite good. There should be no arterial impediments to healing the wound on the right anterior tibial area. Objective Constitutional Patient is hypertensive.. Pulse regular and within target range for patient.Marland Kitchen Respirations regular, non-labored and within target range.. Temperature is normal and within the target range for the patient.Marland Kitchen appears in no distress. Vitals Time Taken: 1:42 PM, Height: 64 in, Weight: 120 lbs, BMI: 20.6, Temperature: 98.3 F, Pulse: 85 bpm, Respiratory Rate: 18 breaths/min, Blood Pressure: 171/99 mmHg. General Notes: Wound examination; the wound is completely eschar covered. We used copious amounts of benzocaine  with a #5 curette I was able to remove most of this. She did not tolerate this very well. There is no evidence of surrounding infection Integumentary (Hair, Skin) Wound #2 status is Open. Original cause of wound was Trauma. The wound is located on the Left,Anterior Lower Leg. The wound measures 2.1cm length x 1.9cm width x 0.1cm depth; 3.134cm^2 area and 0.313cm^3 volume. The wound is limited to skin breakdown. There is no tunneling or undermining noted. There is a small amount of sanguinous drainage noted. The wound margin is flat and intact. There is no granulation within the wound bed. There is a large (67-100%) amount of necrotic tissue within the wound bed including Eschar. The periwound skin appearance did not exhibit: Callus, Crepitus, Excoriation, Induration, Rash, Scarring, Dry/Scaly, Maceration, Atrophie Blanche, Cyanosis, Ecchymosis, Hemosiderin Staining, Mottled, Pallor, Rubor, Erythema. Periwound temperature was noted as No Abnormality. The periwound has tenderness on palpation. Assessment Active Problems ICD-10 Unspecified open wound, left lower leg, initial encounter Non-pressure chronic ulcer of other part of left lower leg with fat layer exposed Paroxysmal atrial fibrillation Long term (current) use of anticoagulants Essential (primary) hypertension Procedures Huard, Kiyomi C. ([id number]) Wound #2 Pre-procedure diagnosis of Wound #2 is a Trauma, Other located on the Left,Anterior Lower Leg . There was a Excisional Skin/Subcutaneous Tissue Debridement with a total area of 3.99 sq cm performed by Ricard Dillon, MD. With the following instrument(s): Curette to remove Viable tissue/material. Material removed includes Subcutaneous Tissue and Slough and after achieving pain control using Lidocaine. No specimens were taken. A time out was conducted at 14:10,  prior to the start of the procedure. A Moderate amount of bleeding was controlled with Pressure. The procedure was  tolerated well. Post Debridement Measurements: 2.1cm length x 1.9cm width x 0.3cm depth; 0.94cm^3 volume. Character of Wound/Ulcer Post Debridement is stable. Post procedure Diagnosis Wound #2: Same as Pre-Procedure Plan Wound Cleansing: Wound #2 Left,Anterior Lower Leg: Clean wound with Normal Saline. Cleanse wound with mild soap and water May Shower, gently pat wound dry prior to applying new dressing. Anesthetic (add to Medication List): Wound #2 Left,Anterior Lower Leg: Topical Lidocaine 4% cream applied to wound bed prior to debridement (In Clinic Only). Primary Wound Dressing: Wound #2 Left,Anterior Lower Leg: Mepitel One Contact layer Santyl Ointment - in clinic Medihoney gel - at home Secondary Dressing: Wound #2 Left,Anterior Lower Leg: ABD and Kerlix/Conform Dressing Change Frequency: Wound #2 Left,Anterior Lower Leg: Change dressing every day. Follow-up Appointments: Wound #2 Left,Anterior Lower Leg: Return Appointment in 1 week. Edema Control: Wound #2 Left,Anterior Lower Leg: Elevate legs to the level of the heart and pump ankles as often as possible Other: - Tubigrip F 1. Debridement today. 2. Continue Santyl when she is in our clinic and Medihoney at home. Her husband seems adamant about dressing this every day. I would like to see if I can get a Kerlix Coban compression on this. He claims to be familiar with doing this. 3. Her arterial studies showed an ABI of 0.93 and a TBI of 0.81 on the left. Her waveforms were triphasic Electronic Signature(s) Signed: 01/29/2019 3:44:34 PM By: Linton Ham MD Entered By: Linton Ham on 01/29/2019 15:31:59 LADEJA, PELHAM ([id number]) FALEN, LEHRMANN ([id number]) -------------------------------------------------------------------------------- SuperBill Details [name] Date of Service: 01/29/2019 Medical Record Number: [id number] Patient Account Number: [id number] Date of Birth/Sex:  07-29-1926 (85 y.o. F) Treating RN: Cornell Barman Primary Care Provider: Derinda Late Other Clinician: Referring Provider: BABAOFF, MARCUS Treating Provider/Extender: Ricard Dillon Weeks in Treatment: 2 Diagnosis Coding ICD-10 Codes Code Description S81.802A Unspecified open wound, left lower leg, initial encounter L97.822 Non-pressure chronic ulcer of other part of left lower leg with fat layer exposed I48.0 Paroxysmal atrial fibrillation Z79.01 Long term (current) use of anticoagulants I10 Essential (primary) hypertension Facility Procedures CPT4 Code Description: [id number] 11042 - DEB SUBQ TISSUE 20 SQ CM/< ICD-10 Diagnosis Description L97.822 Non-pressure chronic ulcer of other part of left lower leg wit Modifier: h fat layer expos Quantity: 1 ed Physician Procedures CPT4 Code Description: [id number] 11042 - WC PHYS SUBQ TISS 20 SQ CM ICD-10 Diagnosis Description L97.822 Non-pressure chronic ulcer of other part of left lower leg wit Modifier: h fat layer expos Quantity: 1 ed Electronic Signature(s) Signed: 01/29/2019 3:44:34 PM By: Linton Ham MD Entered By: Linton Ham on 01/29/2019 15:32:23

## 2019-02-05 ENCOUNTER — Other Ambulatory Visit: Payer: Self-pay

## 2019-02-05 ENCOUNTER — Encounter: Payer: Medicare Other | Admitting: Internal Medicine

## 2019-02-05 DIAGNOSIS — L97822 Non-pressure chronic ulcer of other part of left lower leg with fat layer exposed: Secondary | ICD-10-CM | POA: Diagnosis not present

## 2019-02-05 NOTE — Progress Notes (Signed)
CYNTHA, BRICKMAN ([id number]) Visit Report for 02/05/2019 Debridement Details Patient Name: KAITELYN, JAMISON. Date of Service: 02/05/2019 9:15 AM Medical Record Number: [id number] Patient Account Number: [id number] Date of Birth/Sex: July 17, 1926 (85 y.o. F) Treating RN: Cornell Barman Primary Care Provider: BABAOFF, MARCUS Other Clinician: Referring Provider: BABAOFF, MARCUS Treating Provider/Extender: Ricard Dillon Weeks in Treatment: 3 Debridement Performed for Wound #2 Left,Anterior Lower Leg Assessment: Performed By: Physician Ricard Dillon, MD Debridement Type: Debridement Level of Consciousness (Pre- Awake and Alert procedure): Pre-procedure Verification/Time Yes - 10:00 Out Taken: Start Time: 10:00 Pain Control: Lidocaine Total Area Debrided (L x W): 1.9 (cm) x 1.7 (cm) = 3.23 (cm) Tissue and other material Viable, Non-Viable, Eschar, Slough, Subcutaneous, Slough debrided: Level: Skin/Subcutaneous Tissue Debridement Description: Excisional Instrument: Curette Bleeding: Minimum Hemostasis Achieved: Pressure End Time: 10:03 Response to Treatment: Procedure was tolerated well Level of Consciousness Awake and Alert (Post-procedure): Post Debridement Measurements of Total Wound Length: (cm) 1.9 Width: (cm) 1.7 Depth: (cm) 0.2 Volume: (cm) 0.507 Character of Wound/Ulcer Post Debridement: Stable Post Procedure Diagnosis Same as Pre-procedure Electronic Signature(s) Signed: 02/05/2019 2:01:47 PM By: Linton Ham MD Signed: 02/05/2019 3:17:52 PM By: Gretta Cool, BSN, RN, [name], Kim RN, BSN Entered By: Linton Ham on 02/05/2019 10:25:14 Egger, [name] ([id number]) -------------------------------------------------------------------------------- HPI Details Patient Name: [name]. Date of Service: 02/05/2019 9:15 AM Medical Record Number: [id number] Patient Account Number: [id number] Date of Birth/Sex: 05-19-1926 (85 y.o. F) Treating RN: Cornell Barman Primary Care Provider: Derinda Late Other Clinician: Referring Provider: BABAOFF, MARCUS Treating Provider/Extender: Tito Dine in Treatment: 3 History of Present Illness HPI Description: 06/06/17 on evaluation today patient presents for evaluation concerning an ulcer which she initially had arise as a result of striking her left lower extremity money bed frame. With that being said this was roughly one month ago and unfortunately the wound despite two courses of Keflex have not really made a dramatic improvement. This has continued to drain as well as being exquisitely tender at times. Even to the point that she is having a difficult time walking. Patient does have chronic atrial fibrillation which subsequently leads to her being on long-term anticoagulant therapy and this causes ED bruising which may have contributed to this entry and where things are at this point. She does have valvular heart disease as well as hypertension and her last INR was 2.7. Currently Neosporin, Gauls, and an ace wrap has been utilized. Keflex is the antibiotic that she has been on. Patient has not had a culture up to this point and she states that her blood pressure at the primary care office yesterday was 138/72. Her pain is significant related to be an eight out of 10. No fevers, chills, nausea, or vomiting noted at this time. 06/20/17; patient was admitted here 2 weeks ago. She had a traumatic wound on her left lateral lower leg probably in the setting of some degree of venous insufficiency with surrounding cellulitis. She had been on Keflex, after we saw her she was put on doxycycline which she apparently did not tolerate. Culture that was done at the time showed Morganella which was resistant to Keflex. Apparently the patient continued to take Keflex after the appointment. She is also on Coumadin. I had tried to change her antibiotic when the Morganella culture was brought to my attention  however apparently our staff could not reach the patient to inform them of the antibiotic change 06/26/17; patient has wound on her left lateral lower extremity in the  setting of some degree of venous insufficiency. I gave her cefdinir last week and she is completing this. There is no evidence of surrounding infection. Aggressive debridement last week, wound bed looks healthier. We've been using Aquacel. They're traveling in the [place] next week we'll see her back in 2 weeks unless there are problems. 07/10/17; the patient is been using Aquacel Ag her husband is changing this with Kerlix and conform. She still complains of a lot of pain. Apparently with the antibiotics. We gave 2 or 3 weeks ago. Her INR went up to 6, this was managed by her primary physician 07/17/17; she is using Aquacel Ag and a border foam. Still discomfort although dimensions are better. 07/24/17; patient or for review of a trauma wound on her left anterior leg in the setting of some degree of chronic venous insufficiency she has been using Aquacel Ag and a border foam and making progress. 07/31/17; only a small open area of this original significant trauma is still open. Her husband is changing this every second day [Aquacel Ag] Readmission: 01/14/19 on evaluation today patient is that she seen for initial inspection during the office visit today concerning a trauma/skin tear to the left anterior lower extremity. This is roughly the same region where she was previously treated and years past when she was here in the clinic. Nonetheless upon evaluation today the patient's wound actually showed signs of decent granulation around the edge although unfortunately the skin flap that was torn back did fold under on itself and the flap itself is dying. She does have some eschar surrounded the edges of the wound at this point. Nonetheless this also was very tender for her. The patient does have a history of hypertension, atrial  fibrillation, and long-term use anticoagulant therapy. 01/21/19 on evaluation today patient's wound on the anterior portion of her lower extremity appears to still be necrotic as far as the surface of the wound is concerned. Unfortunately there is no significant improvement at this point compared to last week they were not able to get the Santyl that are using Medihoney and maybe one reason why. The other is I think this may be stained to drive. Possibly adding something such as mepitel over top of the Medihoney may help to loosen this up quite a bit which I think would be in her best interest. Also she still has a lot of erythema and is very tender to touch I'm afraid that we may need to switch to a different antibiotic to see if this will be of benefit there still really nothing that I can culture to get a better picture of what's going on and what we need to do from the standpoint of antibiotics. I do believe the patient needs a referral Talmuds me to vascular in order to evaluate her blood flow based on what I'm seeing they will be able to perform TBI testing if nothing else along with the vascular evaluation to see if there's any evidence for limited blood flow to this lower Frenz, Emili C. ([id number]) extremity. 4/8; I have reviewed the patient's arterial studies and they are really quite good. There should be no arterial impediments to healing the wound on the right anterior tibial area. 4/15; patient has a small open area over the left mid tibial area. Still requiring debridement we are using Santyl here and Medihoney at home [Santyl unaffordable] Electronic Signature(s) Signed: 02/05/2019 2:01:47 PM By: Linton Ham MD Entered By: Linton Ham on 02/05/2019 10:28:10 Mullinax, Michol C. (  [id number]) -------------------------------------------------------------------------------- Physical Exam Details Patient Name: KATHELEEN, STELLA. Date of Service: 02/05/2019 9:15 AM Medical  Record Number: [id number] Patient Account Number: [id number] Date of Birth/Sex: [birth year] (85 y.o. F) Treating RN: Cornell Barman Primary Care Provider: Derinda Late Other Clinician: Referring Provider: BABAOFF, MARCUS Treating Provider/Extender: Ricard Dillon Weeks in Treatment: 3 Constitutional Patient is hypertensive.. Pulse regular and within target range for patient.Marland Kitchen Respirations regular, non-labored and within target range.. Temperature is normal and within the target range for the patient.Marland Kitchen appears in no distress. Cardiovascular Pedal pulses are palpable. Notes Wound examination the wound is present on the left mid tibia area. Circular wound still some remanence of eschar from the extensive debridement last time. We use lidocaine and benzocaine and a #5 curette I removed most of the remanence of the surface eschar. She tolerates this reasonably poorly. Hopefully we will not have to do this again. There is no evidence of surrounding infection Electronic Signature(s) Signed: 02/05/2019 2:01:47 PM By: Linton Ham MD Entered By: Linton Ham on 02/05/2019 10:30:28 [name] ([id number]) -------------------------------------------------------------------------------- Physician Orders Details Patient Name: [name] Date of Service: 02/05/2019 9:15 AM Medical Record Number: [id number] Patient Account Number: [id number] Date of Birth/Sex: Jun 02, 1926 (85 y.o. F) Treating RN: Cornell Barman Primary Care Provider: Derinda Late Other Clinician: Referring Provider: BABAOFF, MARCUS Treating Provider/Extender: Tito Dine in Treatment: 3 Verbal / Phone Orders: No Diagnosis Coding Wound Cleansing Wound #2 Left,Anterior Lower Leg o Clean wound with Normal Saline. o Cleanse wound with mild soap and water o May Shower, gently pat wound dry prior to applying new dressing. Anesthetic (add to Medication List) Wound #2 Left,Anterior Lower Leg o  Topical Lidocaine 4% cream applied to wound bed prior to debridement (In Clinic Only). o Benzocaine Topical Anesthetic Spray applied to wound bed prior to debridement (In Clinic Only). Primary Wound Dressing Wound #2 Left,Anterior Lower Leg o Mepitel One Contact layer o Santyl Ointment - in clinic o Medihoney gel - at home Secondary Dressing Wound #2 Left,Anterior Lower Leg o ABD and Kerlix/Conform Dressing Change Frequency Wound #2 Left,Anterior Lower Leg o Change dressing every day. Follow-up Appointments Wound #2 Left,Anterior Lower Leg o Return Appointment in 1 week. Edema Control Wound #2 Left,Anterior Lower Leg o Elevate legs to the level of the heart and pump ankles as often as possible o Other: - Tubigrip F Electronic Signature(s) Signed: 02/05/2019 2:01:47 PM By: Linton Ham MD Signed: 02/05/2019 3:17:52 PM By: Gretta Cool, BSN, RN, [name], Kim RN, BSN Entered By: Gretta Cool, BSN, RN, [name], Kim on 02/05/2019 10:04:16 [name] ([id number]) -------------------------------------------------------------------------------- Problem List Details Patient Name: HANSINI, CLODFELTER. Date of Service: 02/05/2019 9:15 AM Medical Record Number: [id number] Patient Account Number: [id number] Date of Birth/Sex: 1926/01/13 (85 y.o. F) Treating RN: Cornell Barman Primary Care Provider: Derinda Late Other Clinician: Referring Provider: BABAOFF, MARCUS Treating Provider/Extender: Tito Dine in Treatment: 3 Active Problems ICD-10 Evaluated Encounter Code Description Active Date Today Diagnosis S81.802A Unspecified open wound, left lower leg, initial encounter 01/14/2019 No Yes L97.822 Non-pressure chronic ulcer of other part of left lower leg with 01/14/2019 No Yes fat layer exposed I48.0 Paroxysmal atrial fibrillation 01/14/2019 No Yes Z79.01 Long term (current) use of anticoagulants 01/14/2019 No Yes I10 Essential (primary) hypertension 01/14/2019 No Yes Inactive  Problems Resolved Problems Electronic Signature(s) Signed: 02/05/2019 2:01:47 PM By: Linton Ham MD Entered By: Linton Ham on 02/05/2019 10:24:54 [name] ([id number]) -------------------------------------------------------------------------------- Progress Note Details Patient Name: [name]. Date of Service:  02/05/2019 9:15 AM Medical Record Number: [id number] Patient Account Number: [id number] Date of Birth/Sex: 04/27/1926 (85 y.o. F) Treating RN: Cornell Barman Primary Care Provider: Derinda Late Other Clinician: Referring Provider: BABAOFF, MARCUS Treating Provider/Extender: Ricard Dillon Weeks in Treatment: 3 Subjective History of Present Illness (HPI) 06/06/17 on evaluation today patient presents for evaluation concerning an ulcer which she initially had arise as a result of striking her left lower extremity money bed frame. With that being said this was roughly one month ago and unfortunately the wound despite two courses of Keflex have not really made a dramatic improvement. This has continued to drain as well as being exquisitely tender at times. Even to the point that she is having a difficult time walking. Patient does have chronic atrial fibrillation which subsequently leads to her being on long-term anticoagulant therapy and this causes ED bruising which may have contributed to this entry and where things are at this point. She does have valvular heart disease as well as hypertension and her last INR was 2.7. Currently Neosporin, Gauls, and an ace wrap has been utilized. Keflex is the antibiotic that she has been on. Patient has not had a culture up to this point and she states that her blood pressure at the primary care office yesterday was 138/72. Her pain is significant related to be an eight out of 10. No fevers, chills, nausea, or vomiting noted at this time. 06/20/17; patient was admitted here 2 weeks ago. She had a traumatic wound on her left  lateral lower leg probably in the setting of some degree of venous insufficiency with surrounding cellulitis. She had been on Keflex, after we saw her she was put on doxycycline which she apparently did not tolerate. Culture that was done at the time showed Morganella which was resistant to Keflex. Apparently the patient continued to take Keflex after the appointment. She is also on Coumadin. I had tried to change her antibiotic when the Morganella culture was brought to my attention however apparently our staff could not reach the patient to inform them of the antibiotic change 06/26/17; patient has wound on her left lateral lower extremity in the setting of some degree of venous insufficiency. I gave her cefdinir last week and she is completing this. There is no evidence of surrounding infection. Aggressive debridement last week, wound bed looks healthier. We've been using Aquacel. They're traveling in the [place] next week we'll see her back in 2 weeks unless there are problems. 07/10/17; the patient is been using Aquacel Ag her husband is changing this with Kerlix and conform. She still complains of a lot of pain. Apparently with the antibiotics. We gave 2 or 3 weeks ago. Her INR went up to 6, this was managed by her primary physician 07/17/17; she is using Aquacel Ag and a border foam. Still discomfort although dimensions are better. 07/24/17; patient or for review of a trauma wound on her left anterior leg in the setting of some degree of chronic venous insufficiency she has been using Aquacel Ag and a border foam and making progress. 07/31/17; only a small open area of this original significant trauma is still open. Her husband is changing this every second day [Aquacel Ag] Readmission: 01/14/19 on evaluation today patient is that she seen for initial inspection during the office visit today concerning a trauma/skin tear to the left anterior lower extremity. This is roughly the same region  where she was previously treated and years past when she was here in the  clinic. Nonetheless upon evaluation today the patient's wound actually showed signs of decent granulation around the edge although unfortunately the skin flap that was torn back did fold under on itself and the flap itself is dying. She does have some eschar surrounded the edges of the wound at this point. Nonetheless this also was very tender for her. The patient does have a history of hypertension, atrial fibrillation, and long-term use anticoagulant therapy. 01/21/19 on evaluation today patient's wound on the anterior portion of her lower extremity appears to still be necrotic as far as the surface of the wound is concerned. Unfortunately there is no significant improvement at this point compared to last week they were not able to get the Santyl that are using Medihoney and maybe one reason why. The other is I think this may be stained to drive. Possibly adding something such as mepitel over top of the Medihoney may help to loosen this up quite a bit which I think would be in her best interest. Also she still has a lot of erythema and is very tender to touch I'm afraid that we may need to switch to a different antibiotic to see if this will be of benefit there still really nothing that I can culture to get a better picture of what's going on and what we need to do from the standpoint of antibiotics. I do believe the patient needs a referral Talmuds me to vascular in order to evaluate her blood flow based on what I'm seeing they will be able to perform TBI Brick, Tanashia C. ([id number]) testing if nothing else along with the vascular evaluation to see if there's any evidence for limited blood flow to this lower extremity. 4/8; I have reviewed the patient's arterial studies and they are really quite good. There should be no arterial impediments to healing the wound on the right anterior tibial area. 4/15; patient has a small  open area over the left mid tibial area. Still requiring debridement we are using Santyl here and Medihoney at home [Santyl unaffordable] Objective Constitutional Patient is hypertensive.. Pulse regular and within target range for patient.Marland Kitchen Respirations regular, non-labored and within target range.. Temperature is normal and within the target range for the patient.Marland Kitchen appears in no distress. Vitals Time Taken: 9:27 AM, Height: 64 in, Weight: 120 lbs, BMI: 20.6, Temperature: 98.3 F, Pulse: 92 bpm, Respiratory Rate: 18 breaths/min, Blood Pressure: 164/99 mmHg. Cardiovascular Pedal pulses are palpable. General Notes: Wound examination the wound is present on the left mid tibia area. Circular wound still some remanence of eschar from the extensive debridement last time. We use lidocaine and benzocaine and a #5 curette I removed most of the remanence of the surface eschar. She tolerates this reasonably poorly. Hopefully we will not have to do this again. There is no evidence of surrounding infection Integumentary (Hair, Skin) Wound #2 status is Open. Original cause of wound was Trauma. The wound is located on the Left,Anterior Lower Leg. The wound measures 1.9cm length x 1.7cm width x 0.1cm depth; 2.537cm^2 area and 0.254cm^3 volume. There is Fat Layer (Subcutaneous Tissue) Exposed exposed. There is no tunneling or undermining noted. There is a medium amount of sanguinous drainage noted. The wound margin is flat and intact. There is small (1-33%) red granulation within the wound bed. There is a large (67-100%) amount of necrotic tissue within the wound bed including Eschar and Adherent Slough. The periwound skin appearance exhibited: Hemosiderin Staining. The periwound skin appearance did not exhibit: Callus, Crepitus, Excoriation,  Induration, Rash, Scarring, Dry/Scaly, Maceration, Atrophie Blanche, Cyanosis, Ecchymosis, Mottled, Pallor, Rubor, Erythema. Periwound temperature was noted as No  Abnormality. The periwound has tenderness on palpation. Assessment Active Problems ICD-10 Unspecified open wound, left lower leg, initial encounter Non-pressure chronic ulcer of other part of left lower leg with fat layer exposed Paroxysmal atrial fibrillation Long term (current) use of anticoagulants Essential (primary) hypertension Trueba, Moriah C. ([id number]) Procedures Wound #2 Pre-procedure diagnosis of Wound #2 is a Trauma, Other located on the Left,Anterior Lower Leg . There was a Excisional Skin/Subcutaneous Tissue Debridement with a total area of 3.23 sq cm performed by Ricard Dillon, MD. With the following instrument(s): Curette to remove Viable and Non-Viable tissue/material. Material removed includes Eschar, Subcutaneous Tissue, and Slough after achieving pain control using Lidocaine. No specimens were taken. A time out was conducted at 10:00, prior to the start of the procedure. A Minimum amount of bleeding was controlled with Pressure. The procedure was tolerated well. Post Debridement Measurements: 1.9cm length x 1.7cm width x 0.2cm depth; 0.507cm^3 volume. Character of Wound/Ulcer Post Debridement is stable. Post procedure Diagnosis Wound #2: Same as Pre-Procedure Plan Wound Cleansing: Wound #2 Left,Anterior Lower Leg: Clean wound with Normal Saline. Cleanse wound with mild soap and water May Shower, gently pat wound dry prior to applying new dressing. Anesthetic (add to Medication List): Wound #2 Left,Anterior Lower Leg: Topical Lidocaine 4% cream applied to wound bed prior to debridement (In Clinic Only). Benzocaine Topical Anesthetic Spray applied to wound bed prior to debridement (In Clinic Only). Primary Wound Dressing: Wound #2 Left,Anterior Lower Leg: Mepitel One Contact layer Santyl Ointment - in clinic Medihoney gel - at home Secondary Dressing: Wound #2 Left,Anterior Lower Leg: ABD and Kerlix/Conform Dressing Change Frequency: Wound #2  Left,Anterior Lower Leg: Change dressing every day. Follow-up Appointments: Wound #2 Left,Anterior Lower Leg: Return Appointment in 1 week. Edema Control: Wound #2 Left,Anterior Lower Leg: Elevate legs to the level of the heart and pump ankles as often as possible Other: - Tubigrip F Coste, Shakari C. ([id number]) 1. Continue Santyl/meta honey at home 2. ABD/Kerlix/conform 3. May be able to change to silver collagen next week Electronic Signature(s) Signed: 02/05/2019 2:01:47 PM By: Linton Ham MD Entered By: Linton Ham on 02/05/2019 10:31:24 [name] ([id number]) -------------------------------------------------------------------------------- SuperBill Details Patient Name: [name]. Date of Service: 02/05/2019 Medical Record Number: [id number] Patient Account Number: [id number] Date of Birth/Sex: 09-06-1926 (85 y.o. F) Treating RN: Cornell Barman Primary Care Provider: Derinda Late Other Clinician: Referring Provider: BABAOFF, MARCUS Treating Provider/Extender: Tito Dine in Treatment: 3 Diagnosis Coding ICD-10 Codes Code Description S81.802A Unspecified open wound, left lower leg, initial encounter L97.822 Non-pressure chronic ulcer of other part of left lower leg with fat layer exposed I48.0 Paroxysmal atrial fibrillation Z79.01 Long term (current) use of anticoagulants I10 Essential (primary) hypertension Facility Procedures CPT4 Code Description: [id number] 11042 - DEB SUBQ TISSUE 20 SQ CM/< ICD-10 Diagnosis Description L97.822 Non-pressure chronic ulcer of other part of left lower leg wit Modifier: h fat layer expos Quantity: 1 ed Physician Procedures CPT4 Code Description: [id number] 11042 - WC PHYS SUBQ TISS 20 SQ CM ICD-10 Diagnosis Description L97.822 Non-pressure chronic ulcer of other part of left lower leg wit Modifier: h fat layer expos Quantity: 1 ed Electronic Signature(s) Signed: 02/05/2019 2:01:47 PM By: Linton Ham  MD Entered By: Linton Ham on 02/05/2019 10:31:50

## 2019-02-05 NOTE — Progress Notes (Signed)
[name] ([id number]) Visit Report for 02/05/2019 Arrival Information Details [name], [name]. Date of Service: 02/05/2019 9:15 AM Medical Record Number: [id number] Patient Account Number: [id number] Date of Birth/Sex: Aug 23, 1926 (85 y.o. F) Treating RN: Cornell Barman Primary Care Armanda Forand: Derinda Late Other Clinician: Referring Dena Esperanza: BABAOFF, MARCUS Treating Yan Okray/Extender: Tito Dine in Treatment: 3 Visit Information History Since Last Visit Added or deleted any medications: No Patient Arrived: Ambulatory Any new allergies or adverse reactions: No Arrival Time: 09:26 Had a fall or experienced change in No Accompanied By: husband activities of daily living that may affect Transfer Assistance: None risk of falls: Patient Identification Verified: Yes Signs or symptoms of abuse/neglect since last visito No Secondary Verification Process Yes Hospitalized since last visit: No Completed: Implantable device outside of the clinic excluding No Patient Has Alerts: Yes cellular tissue based products placed in the center Patient Alerts: NEED ABI - TOO since last visit: PAINFUL Has Dressing in Place as Prescribed: Yes Pain Present Now: Yes Electronic Signature(s) Signed: 02/05/2019 12:16:24 PM By: Lorine Bears RCP, RRT, CHT Entered By: Lorine Bears on 02/05/2019 09:31:30 [name], [name] ([id number]) -------------------------------------------------------------------------------- Encounter Discharge Information Details [name]. Date of Service: 02/05/2019 9:15 AM Medical Record Number: [id number] Patient Account Number: [id number] Date of Birth/Sex: 1926/04/22 (85 y.o. F) Treating RN: Montey Hora Primary Care Othell Jaime: Derinda Late Other Clinician: Referring Seline Enzor: BABAOFF, MARCUS Treating Deandrae Wajda/Extender: Tito Dine in Treatment: 3 Encounter Discharge  Information Items Post Procedure Vitals Discharge Condition: Stable Temperature (F): 98.3 Ambulatory Status: Ambulatory Pulse (bpm): 92 Discharge Destination: Home Respiratory Rate (breaths/min): 16 Transportation: Private Auto Blood Pressure (mmHg): 164/99 Accompanied By: spouse Schedule Follow-up Appointment: Yes Clinical Summary of Care: Electronic Signature(s) Signed: 02/05/2019 10:41:19 AM By: Montey Hora Entered By: Montey Hora on 02/05/2019 10:41:19 [name] ([id number]) -------------------------------------------------------------------------------- Lower Extremity Assessment Details [name]. Date of Service: 02/05/2019 9:15 AM Medical Record Number: [id number] Patient Account Number: [id number] Date of Birth/Sex: 05-31-1926 (85 y.o. F) Treating RN: Montey Hora Primary Care Dekisha Mesmer: Derinda Late Other Clinician: Referring Julizza Sassone: BABAOFF, MARCUS Treating Lamone Ferrelli/Extender: Ricard Dillon Weeks in Treatment: 3 Edema Assessment Assessed: [Left: No] [Right: No] Edema: [Left: N] [Right: o] Vascular Assessment Pulses: Dorsalis Pedis Palpable: [Left:Yes] Electronic Signature(s) Signed: 02/05/2019 1:17:32 PM By: Montey Hora Entered By: Montey Hora on 02/05/2019 09:37:23 [name], [name] ([id number]) -------------------------------------------------------------------------------- Multi Wound Chart Details [name]. Date of Service: 02/05/2019 9:15 AM Medical Record Number: [id number] Patient Account Number: [id number] Date of Birth/Sex: 1926/02/09 (85 y.o. F) Treating RN: Cornell Barman Primary Care Daylen Hack: BABAOFF, MARCUS Other Clinician: Referring Annye Forrey: BABAOFF, MARCUS Treating Saliyah Gillin/Extender: Ricard Dillon Weeks in Treatment: 3 Vital Signs Height(in): 64 Pulse(bpm): 92 Weight(lbs): 120 Blood Pressure(mmHg): 164/99 Body Mass Index(BMI): 21 Temperature(F): 98.3 Respiratory  Rate 18 (breaths/min): Photos: [N/A:N/A] Wound Location: Left Lower Leg - Anterior N/A N/A Wounding Event: Trauma N/A N/A Primary Etiology: Trauma, Other N/A N/A Comorbid History: Cataracts, Hypertension, N/A N/A Osteoarthritis, Dementia Date Acquired: 01/07/2019 N/A N/A Weeks of Treatment: 3 N/A N/A Wound Status: Open N/A N/A Measurements L x W x D 1.9x1.7x0.1 N/A N/A (cm) Area (cm) : 2.537 N/A N/A Volume (cm) : 0.254 N/A N/A % Reduction in Area: 32.70% N/A N/A % Reduction in Volume: 32.60% N/A N/A Classification: Partial Thickness N/A N/A Exudate Amount: Medium N/A N/A Exudate Type: Sanguinous N/A N/A Exudate Color: red N/A N/A Wound Margin: Flat and Intact N/A N/A Granulation  Amount: Small (1-33%) N/A N/A Granulation Quality: Red N/A N/A Necrotic Amount: Large (67-100%) N/A N/A Necrotic Tissue: Eschar, Adherent Slough N/A N/A Exposed Structures: Fat Layer (Subcutaneous N/A N/A Tissue) Exposed: Yes Fascia: No Tendon: No Muscle: No [name], [name] ([id number]) Joint: No Bone: No Epithelialization: None N/A N/A Debridement: Debridement - Excisional N/A N/A Pre-procedure 10:00 N/A N/A Verification/Time Out Taken: Pain Control: Lidocaine N/A N/A Tissue Debrided: Necrotic/Eschar, N/A N/A Subcutaneous, Slough Level: Skin/Subcutaneous Tissue N/A N/A Debridement Area (sq cm): 3.23 N/A N/A Instrument: Curette N/A N/A Bleeding: Minimum N/A N/A Hemostasis Achieved: Pressure N/A N/A Debridement Treatment Procedure was tolerated well N/A N/A Response: Post Debridement 1.9x1.7x0.2 N/A N/A Measurements L x W x D (cm) Post Debridement Volume: 0.507 N/A N/A (cm) Periwound Skin Texture: Excoriation: No N/A N/A Induration: No Callus: No Crepitus: No Rash: No Scarring: No Periwound Skin Moisture: Maceration: No N/A N/A Dry/Scaly: No Periwound Skin Color: Hemosiderin Staining: Yes N/A N/A Atrophie Blanche: No Cyanosis: No Ecchymosis: No Erythema: No Mottled:  No Pallor: No Rubor: No Temperature: No Abnormality N/A N/A Tenderness on Palpation: Yes N/A N/A Procedures Performed: Debridement N/A N/A Treatment Notes Electronic Signature(s) Signed: 02/05/2019 2:01:47 PM By: Linton Ham MD Entered By: Linton Ham on 02/05/2019 10:25:03 [name] ([id number]) -------------------------------------------------------------------------------- Multi-Disciplinary Care Plan Details [name]. Date of Service: 02/05/2019 9:15 AM Medical Record Number: [id number] Patient Account Number: [id number] Date of Birth/Sex: 1925/12/29 (85 y.o. F) Treating RN: Cornell Barman Primary Care Maleigha Colvard: Derinda Late Other Clinician: Referring Coben Godshall: BABAOFF, MARCUS Treating Garrett Bowring/Extender: Tito Dine in Treatment: 3 Active Inactive Abuse / Safety / Falls / Self Care Management Nursing Diagnoses: Potential for falls Goals: Patient will remain injury free related to falls Date Initiated: 01/14/2019 Target Resolution Date: 03/29/2019 Goal Status: Active Interventions: Assess fall risk on admission and as needed Notes: Necrotic Tissue Nursing Diagnoses: Impaired tissue integrity related to necrotic/devitalized tissue Goals: Patient/caregiver will verbalize understanding of reason and process for debridement of necrotic tissue Date Initiated: 01/21/2019 Target Resolution Date: 03/29/2019 Goal Status: Active Interventions: Provide education on necrotic tissue and debridement process Notes: Orientation to the Wound Care Program Nursing Diagnoses: Knowledge deficit related to the wound healing center program Goals: Patient/caregiver will verbalize understanding of the [facility] Date Initiated: 01/14/2019 Target Resolution Date: 03/29/2019 Goal Status: Active Interventions: Provide education on orientation to the wound center [name], [name]. ([id number]) Notes: Pain, Acute or Chronic Nursing  Diagnoses: Pain, acute or chronic: actual or potential Goals: Patient will verbalize adequate pain control and receive pain control interventions during procedures as needed Date Initiated: 01/21/2019 Target Resolution Date: 03/29/2019 Goal Status: Active Interventions: Complete pain assessment as per visit requirements Notes: Wound/Skin Impairment Nursing Diagnoses: Impaired tissue integrity Goals: Ulcer/skin breakdown will heal within 14 weeks Date Initiated: 01/14/2019 Target Resolution Date: 03/29/2019 Goal Status: Active Interventions: Assess patient/caregiver ability to obtain necessary supplies Assess patient/caregiver ability to perform ulcer/skin care regimen upon admission and as needed Assess ulceration(s) every visit Notes: Electronic Signature(s) Signed: 02/05/2019 3:17:52 PM By: Gretta Cool, BSN, RN, [name], Kim RN, BSN Entered By: Gretta Cool, BSN, RN, [name], Kim on 02/05/2019 10:02:36 [name] ([id number]) -------------------------------------------------------------------------------- Pain Assessment Details [name]. Date of Service: 02/05/2019 9:15 AM Medical Record Number: [id number] Patient Account Number: [id number] Date of Birth/Sex: 04-28-1926 (85 y.o. F) Treating RN: Cornell Barman Primary Care Julissa Browning: Derinda Late Other Clinician: Referring Dream Nodal: BABAOFF, MARCUS Treating Srinidhi Landers/Extender: Ricard Dillon Weeks in Treatment: 3 Active Problems Location  of Pain Severity and Description of Pain Patient Has Paino Yes Site Locations Rate the pain. Current Pain Level: 6 Pain Management and Medication Current Pain Management: Electronic Signature(s) Signed: 02/05/2019 12:16:24 PM By: Lorine Bears RCP, RRT, CHT Signed: 02/05/2019 3:17:52 PM By: Gretta Cool, BSN, RN, [name], Kim RN, BSN Entered By: Lorine Bears on 02/05/2019 09:31:41 [name]  ([id number]) -------------------------------------------------------------------------------- Patient/Caregiver Education Details [name] Date of Service: 02/05/2019 9:15 AM Medical Record Number: [id number] Patient Account Number: [id number] Date of Birth/Gender: 1926-02-13 (85 y.o. F) Treating RN: Cornell Barman Primary Care Physician: Derinda Late Other Clinician: Referring Physician: BABAOFF, MARCUS Treating Physician/Extender: Tito Dine in Treatment: 3 Education Assessment Education Provided To: Patient Education Topics Provided Wound Debridement: Handouts: Wound Debridement Methods: Demonstration, Explain/Verbal Responses: State content correctly Wound/Skin Impairment: Handouts: Caring for Your Ulcer Methods: Demonstration, Explain/Verbal Responses: State content correctly Electronic Signature(s) Signed: 02/05/2019 3:17:52 PM By: Gretta Cool, BSN, RN, [name], Kim RN, BSN Entered By: Gretta Cool, BSN, RN, [name], Kim on 02/05/2019 10:04:46 [name] ([id number]) -------------------------------------------------------------------------------- Wound Assessment Details [name], [name] Date of Service: 02/05/2019 9:15 AM Medical Record Number: [id number] Patient Account Number: [id number] Date of Birth/Sex: 04-08-1926 (85 y.o. F) Treating RN: Montey Hora Primary Care Alexx Giambra: BABAOFF, MARCUS Other Clinician: Referring Sharise Lippy: BABAOFF, MARCUS Treating Gloriajean Okun/Extender: Ricard Dillon Weeks in Treatment: 3 Wound Status Wound Number: 2 Primary Trauma, Other Etiology: Wound Location: Left Lower Leg - Anterior Wound Status: Open Wounding Event: Trauma Comorbid Cataracts, Hypertension, Osteoarthritis, Date Acquired: 01/07/2019 History: Dementia Weeks Of Treatment: 3 Clustered Wound: No Photos Wound Measurements Length: (cm) 1.9 % Reductio Width: (cm) 1.7 % Reductio Depth: (cm) 0.1 Epithelial Area: (cm) 2.537  Tunneling Volume: (cm) 0.254 Undermini n in Area: 32.7% n in Volume: 32.6% ization: None : No ng: No Wound Description Classification: Partial Thickness Foul Odor Wound Margin: Flat and Intact Slough/Fi Exudate Amount: Medium Exudate Type: Sanguinous Exudate Color: red After Cleansing: No brino No Wound Bed Granulation Amount: Small (1-33%) Exposed Structure Granulation Quality: Red Fascia Exposed: No Necrotic Amount: Large (67-100%) Fat Layer (Subcutaneous Tissue) Exposed: Yes Necrotic Quality: Eschar, Adherent Slough Tendon Exposed: No Muscle Exposed: No Joint Exposed: No Bone Exposed: No Periwound Skin Texture Texture Color [name], [name] ([id number]) No Abnormalities Noted: No No Abnormalities Noted: No Callus: No Atrophie Blanche: No Crepitus: No Cyanosis: No Excoriation: No Ecchymosis: No Induration: No Erythema: No Rash: No Hemosiderin Staining: Yes Scarring: No Mottled: No Pallor: No Moisture Rubor: No No Abnormalities Noted: No Dry / Scaly: No Temperature / Pain Maceration: No Temperature: No Abnormality Tenderness on Palpation: Yes Treatment Notes Wound #2 (Left, Anterior Lower Leg) Notes santyl, mepitel, ABD, conform, tubi grip Electronic Signature(s) Signed: 02/05/2019 1:17:32 PM By: Montey Hora Entered By: Montey Hora on 02/05/2019 09:40:08 [name] ([id number]) -------------------------------------------------------------------------------- Vitals Details [name]. Date of Service: 02/05/2019 9:15 AM Medical Record Number: [id number] Patient Account Number: [id number] Date of Birth/Sex: 01-Sep-1926 (85 y.o. F) Treating RN: Cornell Barman Primary Care Stefano Trulson: BABAOFF, MARCUS Other Clinician: Referring Neesa Knapik: BABAOFF, MARCUS Treating Donal Lynam/Extender: Ricard Dillon Weeks in Treatment: 3 Vital Signs Time Taken: 09:27 Temperature (F): 98.3 Height (in): 64 Pulse (bpm): 92 Weight (lbs):  120 Respiratory Rate (breaths/min): 18 Body Mass Index (BMI): 20.6 Blood Pressure (mmHg): 164/99 Reference Range: 80 - 120 mg / dl Electronic Signature(s) Signed: 02/05/2019 12:16:24 PM By: Lorine Bears RCP, RRT, CHT Entered By: Lorine Bears on 02/05/2019 09:31:11

## 2019-02-12 ENCOUNTER — Encounter: Payer: Medicare Other | Admitting: Internal Medicine

## 2019-02-12 ENCOUNTER — Other Ambulatory Visit: Payer: Self-pay

## 2019-02-12 DIAGNOSIS — L97822 Non-pressure chronic ulcer of other part of left lower leg with fat layer exposed: Secondary | ICD-10-CM | POA: Diagnosis not present

## 2019-02-12 NOTE — Progress Notes (Signed)
IRETHA, KIRLEY ([id number]) Visit Report for 02/12/2019 Debridement Details [name], [name]. Date of Service: 02/12/2019 9:00 AM Medical Record Number: [id number] Patient Account Number: [id number] Date of Birth/Sex: Jul 31, 1926 (85 y.o. F) Treating RN: Cornell Barman Primary Care Provider: Derinda Late Other Clinician: Referring Provider: BABAOFF, MARCUS Treating Provider/Extender: Tito Dine in Treatment: 4 Debridement Performed for Wound #2 Left,Anterior Lower Leg Assessment: Performed By: Physician Ricard Dillon, MD Debridement Type: Debridement Level of Consciousness (Pre- Awake and Alert procedure): Pre-procedure Verification/Time Yes - 09:31 Out Taken: Start Time: 09:31 Pain Control: Lidocaine Total Area Debrided (L x W): 1.5 (cm) x 1.6 (cm) = 2.4 (cm) Tissue and other material Viable, Non-Viable, Eschar, Slough, Subcutaneous, Slough debrided: Level: Skin/Subcutaneous Tissue Debridement Description: Excisional Instrument: Curette Bleeding: Minimum Hemostasis Achieved: Pressure End Time: 09:32 Response to Treatment: Procedure was tolerated well Level of Consciousness Awake and Alert (Post-procedure): Post Debridement Measurements of Total Wound Length: (cm) 1.5 Width: (cm) 1.6 Depth: (cm) 0.2 Volume: (cm) 0.377 Character of Wound/Ulcer Post Debridement: Stable Post Procedure Diagnosis Same as Pre-procedure Electronic Signature(s) Signed: 02/12/2019 3:44:07 PM By: Linton Ham MD Signed: 02/12/2019 4:31:15 PM By: Gretta Cool, BSN, RN, [name], Kim RN, BSN Entered By: Linton Ham on 02/12/2019 09:49:01 Cangemi, [name] ([id number]) -------------------------------------------------------------------------------- HPI Details [name]. Date of Service: 02/12/2019 9:00 AM Medical Record Number: [id number] Patient Account Number: [id number] Date of Birth/Sex: [birth year] (85 y.o. F) Treating RN: Cornell Barman Primary Care Provider: Derinda Late Other Clinician: Referring Provider: BABAOFF, MARCUS Treating Provider/Extender: Tito Dine in Treatment: 4 History of Present Illness HPI Description: 06/06/17 on evaluation today patient presents for evaluation concerning an ulcer which she initially had arise as a result of striking her left lower extremity money bed frame. With that being said this was roughly one month ago and unfortunately the wound despite two courses of Keflex have not really made a dramatic improvement. This has continued to drain as well as being exquisitely tender at times. Even to the point that she is having a difficult time walking. Patient does have chronic atrial fibrillation which subsequently leads to her being on long-term anticoagulant therapy and this causes ED bruising which may have contributed to this entry and where things are at this point. She does have valvular heart disease as well as hypertension and her last INR was 2.7. Currently Neosporin, Gauls, and an ace wrap has been utilized. Keflex is the antibiotic that she has been on. Patient has not had a culture up to this point and she states that her blood pressure at the primary care office yesterday was 138/72. Her pain is significant related to be an eight out of 10. No fevers, chills, nausea, or vomiting noted at this time. 06/20/17; patient was admitted here 2 weeks ago. She had a traumatic wound on her left lateral lower leg probably in the setting of some degree of venous insufficiency with surrounding cellulitis. She had been on Keflex, after we saw her she was put on doxycycline which she apparently did not tolerate. Culture that was done at the time showed Morganella which was resistant to Keflex. Apparently the patient continued to take Keflex after the appointment. She is also on Coumadin. I had tried to change her antibiotic when the Morganella culture was brought to my attention  however apparently our staff could not reach the patient to inform them of the antibiotic change 06/26/17; patient has wound on her left lateral lower extremity in the  setting of some degree of venous insufficiency. I gave her cefdinir last week and she is completing this. There is no evidence of surrounding infection. Aggressive debridement last week, wound bed looks healthier. We've been using Aquacel. They're traveling in the [place] next week we'll see her back in 2 weeks unless there are problems. 07/10/17; the patient is been using Aquacel Ag her husband is changing this with Kerlix and conform. She still complains of a lot of pain. Apparently with the antibiotics. We gave 2 or 3 weeks ago. Her INR went up to 6, this was managed by her primary physician 07/17/17; she is using Aquacel Ag and a border foam. Still discomfort although dimensions are better. 07/24/17; patient or for review of a trauma wound on her left anterior leg in the setting of some degree of chronic venous insufficiency she has been using Aquacel Ag and a border foam and making progress. 07/31/17; only a small open area of this original significant trauma is still open. Her husband is changing this every second day [Aquacel Ag] Readmission: 01/14/19 on evaluation today patient is that she seen for initial inspection during the office visit today concerning a trauma/skin tear to the left anterior lower extremity. This is roughly the same region where she was previously treated and years past when she was here in the clinic. Nonetheless upon evaluation today the patient's wound actually showed signs of decent granulation around the edge although unfortunately the skin flap that was torn back did fold under on itself and the flap itself is dying. She does have some eschar surrounded the edges of the wound at this point. Nonetheless this also was very tender for her. The patient does have a history of hypertension, atrial  fibrillation, and long-term use anticoagulant therapy. 01/21/19 on evaluation today patient's wound on the anterior portion of her lower extremity appears to still be necrotic as far as the surface of the wound is concerned. Unfortunately there is no significant improvement at this point compared to last week they were not able to get the Santyl that are using Medihoney and maybe one reason why. The other is I think this may be stained to drive. Possibly adding something such as mepitel over top of the Medihoney may help to loosen this up quite a bit which I think would be in her best interest. Also she still has a lot of erythema and is very tender to touch I'm afraid that we may need to switch to a different antibiotic to see if this will be of benefit there still really nothing that I can culture to get a better picture of what's going on and what we need to do from the standpoint of antibiotics. I do believe the patient needs a referral Talmuds me to vascular in order to evaluate her blood flow based on what I'm seeing they will be able to perform TBI testing if nothing else along with the vascular evaluation to see if there's any evidence for limited blood flow to this lower Hotz, Corianna C. ([id number]) extremity. 4/8; I have reviewed the patient's arterial studies and they are really quite good. There should be no arterial impediments to healing the wound on the right anterior tibial area. 4/15; patient has a small open area over the left mid tibial area. Still requiring debridement we are using Santyl here and Medihoney at home [Santyl unaffordable] 4/22; difficult, presumably traumatic area over the left mid tibial area in the setting of chronic venous insufficiency. We have  been using Santyl here and meta honey at home. The wound surface is getting gradually better and slight improvement in dimensions. Her husband is changing this daily Electronic Signature(s) Signed: 02/12/2019 3:44:07  PM By: Linton Ham MD Entered By: Linton Ham on 02/12/2019 09:49:57 [name] ([id number]) -------------------------------------------------------------------------------- Physical Exam Details [name], [name]. Date of Service: 02/12/2019 9:00 AM Medical Record Number: [id number] Patient Account Number: [id number] Date of Birth/Sex: 1926/06/25 (85 y.o. F) Treating RN: Cornell Barman Primary Care Provider: Derinda Late Other Clinician: Referring Provider: BABAOFF, MARCUS Treating Provider/Extender: Ricard Dillon Weeks in Treatment: 4 Constitutional Patient is hypertensive.. Pulse regular and within target range for patient.Marland Kitchen Respirations regular, non-labored and within target range.. Temperature is normal and within the target range for the patient.. Eyes Conjunctivae clear. No discharge. Notes Wound examination wound is present on the left mid tibia. Circular dime sized wound slightly down in terms of surface area per our measurements. Still requiring debridement but the debridement is easier from my point of view although the patient still complains of a lot of pain. There is no evidence of surrounding infection. Hemostasis with direct pressure Electronic Signature(s) Signed: 02/12/2019 3:44:07 PM By: Linton Ham MD Entered By: Linton Ham on 02/12/2019 09:51:07 Manfredi, [name] ([id number]) -------------------------------------------------------------------------------- Physician Orders Details [name] Date of Service: 02/12/2019 9:00 AM Medical Record Number: [id number] Patient Account Number: [id number] Date of Birth/Sex: [birth year] (85 y.o. F) Treating RN: Cornell Barman Primary Care Provider: Derinda Late Other Clinician: Referring Provider: BABAOFF, MARCUS Treating Provider/Extender: Tito Dine in Treatment: 4 Verbal / Phone Orders: No Diagnosis Coding Wound Cleansing Wound #2 Left,Anterior  Lower Leg o Clean wound with Normal Saline. o Cleanse wound with mild soap and water o May Shower, gently pat wound dry prior to applying new dressing. Anesthetic (add to Medication List) Wound #2 Left,Anterior Lower Leg o Topical Lidocaine 4% cream applied to wound bed prior to debridement (In Clinic Only). o Benzocaine Topical Anesthetic Spray applied to wound bed prior to debridement (In Clinic Only). Primary Wound Dressing Wound #2 Left,Anterior Lower Leg o Mepitel One Contact layer o Santyl Ointment - in clinic o Medihoney gel - at home Secondary Dressing Wound #2 Left,Anterior Lower Leg o ABD and Kerlix/Conform Dressing Change Frequency Wound #2 Left,Anterior Lower Leg o Change dressing every day. Follow-up Appointments Wound #2 Left,Anterior Lower Leg o Return Appointment in 1 week. Edema Control Wound #2 Left,Anterior Lower Leg o Elevate legs to the level of the heart and pump ankles as often as possible o Other: - Tubigrip F Electronic Signature(s) Signed: 02/12/2019 3:44:07 PM By: Linton Ham MD Signed: 02/12/2019 4:31:15 PM By: Gretta Cool, BSN, RN, [name], Kim RN, BSN Entered By: Gretta Cool, BSN, RN, [name], Kim on 02/12/2019 09:33:22 [name] ([id number]) -------------------------------------------------------------------------------- Problem List Details Patient Name: FALON, HUESCA. Date of Service: 02/12/2019 9:00 AM Medical Record Number: [id number] Patient Account Number: [id number] Date of Birth/Sex: [birth year] (85 y.o. F) Treating RN: Cornell Barman Primary Care Provider: BABAOFF, MARCUS Other Clinician: Referring Provider: BABAOFF, MARCUS Treating Provider/Extender: Tito Dine in Treatment: 4 Active Problems ICD-10 Evaluated Encounter Code Description Active Date Today Diagnosis S81.802A Unspecified open wound, left lower leg, initial encounter 01/14/2019 No Yes L97.822 Non-pressure chronic ulcer of other part of left  lower leg with 01/14/2019 No Yes fat layer exposed I48.0 Paroxysmal atrial fibrillation 01/14/2019 No Yes Z79.01 Long term (current) use of anticoagulants 01/14/2019 No Yes I10 Essential (primary) hypertension 01/14/2019 No Yes Inactive  Problems Resolved Problems Electronic Signature(s) Signed: 02/12/2019 3:44:07 PM By: Linton Ham MD Entered By: Linton Ham on 02/12/2019 09:48:33 Radick, [name] ([id number]) -------------------------------------------------------------------------------- Progress Note Details [name]. Date of Service: 02/12/2019 9:00 AM Medical Record Number: [id number] Patient Account Number: [id number] Date of Birth/Sex: [birth year] (85 y.o. F) Treating RN: Cornell Barman Primary Care Provider: Derinda Late Other Clinician: Referring Provider: BABAOFF, MARCUS Treating Provider/Extender: Ricard Dillon Weeks in Treatment: 4 Subjective History of Present Illness (HPI) 06/06/17 on evaluation today patient presents for evaluation concerning an ulcer which she initially had arise as a result of striking her left lower extremity money bed frame. With that being said this was roughly one month ago and unfortunately the wound despite two courses of Keflex have not really made a dramatic improvement. This has continued to drain as well as being exquisitely tender at times. Even to the point that she is having a difficult time walking. Patient does have chronic atrial fibrillation which subsequently leads to her being on long-term anticoagulant therapy and this causes ED bruising which may have contributed to this entry and where things are at this point. She does have valvular heart disease as well as hypertension and her last INR was 2.7. Currently Neosporin, Gauls, and an ace wrap has been utilized. Keflex is the antibiotic that she has been on. Patient has not had a culture up to this point and she states that her blood pressure at the primary  care office yesterday was 138/72. Her pain is significant related to be an eight out of 10. No fevers, chills, nausea, or vomiting noted at this time. 06/20/17; patient was admitted here 2 weeks ago. She had a traumatic wound on her left lateral lower leg probably in the setting of some degree of venous insufficiency with surrounding cellulitis. She had been on Keflex, after we saw her she was put on doxycycline which she apparently did not tolerate. Culture that was done at the time showed Morganella which was resistant to Keflex. Apparently the patient continued to take Keflex after the appointment. She is also on Coumadin. I had tried to change her antibiotic when the Morganella culture was brought to my attention however apparently our staff could not reach the patient to inform them of the antibiotic change 06/26/17; patient has wound on her left lateral lower extremity in the setting of some degree of venous insufficiency. I gave her cefdinir last week and she is completing this. There is no evidence of surrounding infection. Aggressive debridement last week, wound bed looks healthier. We've been using Aquacel. They're traveling in the [place] next week we'll see her back in 2 weeks unless there are problems. 07/10/17; the patient is been using Aquacel Ag her husband is changing this with Kerlix and conform. She still complains of a lot of pain. Apparently with the antibiotics. We gave 2 or 3 weeks ago. Her INR went up to 6, this was managed by her primary physician 07/17/17; she is using Aquacel Ag and a border foam. Still discomfort although dimensions are better. 07/24/17; patient or for review of a trauma wound on her left anterior leg in the setting of some degree of chronic venous insufficiency she has been using Aquacel Ag and a border foam and making progress. 07/31/17; only a small open area of this original significant trauma is still open. Her husband is changing this every second  day [Aquacel Ag] Readmission: 01/14/19 on evaluation today patient is that she seen for initial  inspection during the office visit today concerning a trauma/skin tear to the left anterior lower extremity. This is roughly the same region where she was previously treated and years past when she was here in the clinic. Nonetheless upon evaluation today the patient's wound actually showed signs of decent granulation around the edge although unfortunately the skin flap that was torn back did fold under on itself and the flap itself is dying. She does have some eschar surrounded the edges of the wound at this point. Nonetheless this also was very tender for her. The patient does have a history of hypertension, atrial fibrillation, and long-term use anticoagulant therapy. 01/21/19 on evaluation today patient's wound on the anterior portion of her lower extremity appears to still be necrotic as far as the surface of the wound is concerned. Unfortunately there is no significant improvement at this point compared to last week they were not able to get the Santyl that are using Medihoney and maybe one reason why. The other is I think this may be stained to drive. Possibly adding something such as mepitel over top of the Medihoney may help to loosen this up quite a bit which I think would be in her best interest. Also she still has a lot of erythema and is very tender to touch I'm afraid that we may need to switch to a different antibiotic to see if this will be of benefit there still really nothing that I can culture to get a better picture of what's going on and what we need to do from the standpoint of antibiotics. I do believe the patient needs a referral Talmuds me to vascular in order to evaluate her blood flow based on what I'm seeing they will be able to perform TBI Baade, [name] ([id number]) testing if nothing else along with the vascular evaluation to see if there's any evidence for limited blood  flow to this lower extremity. 4/8; I have reviewed the patient's arterial studies and they are really quite good. There should be no arterial impediments to healing the wound on the right anterior tibial area. 4/15; patient has a small open area over the left mid tibial area. Still requiring debridement we are using Santyl here and Medihoney at home [Santyl unaffordable] 4/22; difficult, presumably traumatic area over the left mid tibial area in the setting of chronic venous insufficiency. We have been using Santyl here and meta honey at home. The wound surface is getting gradually better and slight improvement in dimensions. Her husband is changing this daily Objective Constitutional Patient is hypertensive.. Pulse regular and within target range for patient.Marland Kitchen Respirations regular, non-labored and within target range.. Temperature is normal and within the target range for the patient.. Vitals Time Taken: 9:09 AM, Height: 64 in, Weight: 120 lbs, BMI: 20.6, Temperature: 97.8 F, Pulse: 93 bpm, Respiratory Rate: 18 breaths/min, Blood Pressure: 181/84 mmHg. Eyes Conjunctivae clear. No discharge. General Notes: Wound examination wound is present on the left mid tibia. Circular dime sized wound slightly down in terms of surface area per our measurements. Still requiring debridement but the debridement is easier from my point of view although the patient still complains of a lot of pain. There is no evidence of surrounding infection. Hemostasis with direct pressure Integumentary (Hair, Skin) Wound #2 status is Open. Original cause of wound was Trauma. The wound is located on the Left,Anterior Lower Leg. The wound measures 1.5cm length x 1.6cm width x 0.1cm depth; 1.885cm^2 area and 0.188cm^3 volume. There is Fat Layer (  Subcutaneous Tissue) Exposed exposed. There is no tunneling or undermining noted. There is a medium amount of sanguinous drainage noted. The wound margin is flat and intact. There  is small (1-33%) red granulation within the wound bed. There is a large (67-100%) amount of necrotic tissue within the wound bed including Eschar and Adherent Slough. The periwound skin appearance exhibited: Hemosiderin Staining. The periwound skin appearance did not exhibit: Callus, Crepitus, Excoriation, Induration, Rash, Scarring, Dry/Scaly, Maceration, Atrophie Blanche, Cyanosis, Ecchymosis, Mottled, Pallor, Rubor, Erythema. Periwound temperature was noted as No Abnormality. The periwound has tenderness on palpation. Assessment Active Problems ICD-10 Unspecified open wound, left lower leg, initial encounter Non-pressure chronic ulcer of other part of left lower leg with fat layer exposed Paroxysmal atrial fibrillation Long term (current) use of anticoagulants [name], [name] ([id number]) Essential (primary) hypertension Procedures Wound #2 Pre-procedure diagnosis of Wound #2 is a Trauma, Other located on the Left,Anterior Lower Leg . There was a Excisional Skin/Subcutaneous Tissue Debridement with a total area of 2.4 sq cm performed by Ricard Dillon, MD. With the following instrument(s): Curette to remove Viable and Non-Viable tissue/material. Material removed includes Eschar, Subcutaneous Tissue, and Slough after achieving pain control using Lidocaine. No specimens were taken. A time out was conducted at 09:31, prior to the start of the procedure. A Minimum amount of bleeding was controlled with Pressure. The procedure was tolerated well. Post Debridement Measurements: 1.5cm length x 1.6cm width x 0.2cm depth; 0.377cm^3 volume. Character of Wound/Ulcer Post Debridement is stable. Post procedure Diagnosis Wound #2: Same as Pre-Procedure Plan Wound Cleansing: Wound #2 Left,Anterior Lower Leg: Clean wound with Normal Saline. Cleanse wound with mild soap and water May Shower, gently pat wound dry prior to applying new dressing. Anesthetic (add to Medication List): Wound #2  Left,Anterior Lower Leg: Topical Lidocaine 4% cream applied to wound bed prior to debridement (In Clinic Only). Benzocaine Topical Anesthetic Spray applied to wound bed prior to debridement (In Clinic Only). Primary Wound Dressing: Wound #2 Left,Anterior Lower Leg: Mepitel One Contact layer Santyl Ointment - in clinic Medihoney gel - at home Secondary Dressing: Wound #2 Left,Anterior Lower Leg: ABD and Kerlix/Conform Dressing Change Frequency: Wound #2 Left,Anterior Lower Leg: Change dressing every day. Follow-up Appointments: Wound #2 Left,Anterior Lower Leg: Return Appointment in 1 week. Edema Control: Wound #2 Left,Anterior Lower Leg: Elevate legs to the level of the heart and pump ankles as often as possible Other: - Tubigrip F [name], Sirena C. ([id number]) 1. I am continue with Santyl and meta honey. 2. May change to [facility] and leave under compression. Surface is close to allowing this Electronic Signature(s) Signed: 02/12/2019 3:44:07 PM By: Linton Ham MD Entered By: Linton Ham on 02/12/2019 09:52:11 Aderman, [name] ([id number]) -------------------------------------------------------------------------------- SuperBill Details [name] Date of Service: 02/12/2019 Medical Record Number: [id number] Patient Account Number: [id number] Date of Birth/Sex: 1925/11/15 (85 y.o. F) Treating RN: Cornell Barman Primary Care Provider: Derinda Late Other Clinician: Referring Provider: BABAOFF, MARCUS Treating Provider/Extender: Ricard Dillon Weeks in Treatment: 4 Diagnosis Coding ICD-10 Codes Code Description S81.802A Unspecified open wound, left lower leg, initial encounter L97.822 Non-pressure chronic ulcer of other part of left lower leg with fat layer exposed I48.0 Paroxysmal atrial fibrillation Z79.01 Long term (current) use of anticoagulants I10 Essential (primary) hypertension Facility Procedures CPT4 Code Description: [id number]  11042 - DEB SUBQ TISSUE 20 SQ CM/< ICD-10 Diagnosis Description L97.822 Non-pressure chronic ulcer of other part of left lower leg wit Modifier: h fat layer expos Quantity:  1 ed Physician Procedures CPT4 Code Description: [id number] 11042 - WC PHYS SUBQ TISS 20 SQ CM ICD-10 Diagnosis Description L97.822 Non-pressure chronic ulcer of other part of left lower leg wit Modifier: h fat layer expos Quantity: 1 ed Electronic Signature(s) Signed: 02/12/2019 3:44:07 PM By: Linton Ham MD Entered By: Linton Ham on 02/12/2019 09:52:55

## 2019-02-17 NOTE — Progress Notes (Signed)
SARAJEAN, DESSERT ([id number]) Visit Report for 02/12/2019 Arrival Information Details [name], [name]. Date of Service: 02/12/2019 9:00 AM Medical Record Number: [id number] Patient Account Number: [id number] Date of Birth/Sex: Oct 06, 1926 (85 y.o. F) Treating RN: Cornell Barman Primary Care Maham Quintin: Derinda Late Other Clinician: Referring Bates Collington: BABAOFF, MARCUS Treating Casidy Alberta/Extender: Tito Dine in Treatment: 4 Visit Information History Since Last Visit Added or deleted any medications: No Patient Arrived: Ambulatory Any new allergies or adverse reactions: No Arrival Time: 09:06 Had a fall or experienced change in No Accompanied By: husband activities of daily living that may affect Transfer Assistance: None risk of falls: Patient Identification Verified: Yes Signs or symptoms of abuse/neglect since last visito No Secondary Verification Process Yes Hospitalized since last visit: No Completed: Implantable device outside of the clinic excluding No Patient Has Alerts: Yes cellular tissue based products placed in the center Patient Alerts: NEED ABI - TOO since last visit: PAINFUL Has Dressing in Place as Prescribed: Yes Pain Present Now: Yes Electronic Signature(s) Signed: 02/17/2019 3:31:41 PM By: Lorine Bears RCP, RRT, CHT Entered By: Lorine Bears on 02/12/2019 09:08:56 [name] ([id number]) -------------------------------------------------------------------------------- Encounter Discharge Information Details [name]. Date of Service: 02/12/2019 9:00 AM Medical Record Number: [id number] Patient Account Number: [id number] Date of Birth/Sex: [birth year] (85 y.o. F) Treating RN: Harold Barban Primary Care Erskine Steinfeldt: Derinda Late Other Clinician: Referring Supreme Rybarczyk: BABAOFF, MARCUS Treating Kourtni Stineman/Extender: Tito Dine in Treatment: 4 Encounter Discharge  Information Items Post Procedure Vitals Discharge Condition: Stable Temperature (F): 97.8 Ambulatory Status: Ambulatory Pulse (bpm): 93 Discharge Destination: Home Respiratory Rate (breaths/min): 18 Transportation: Private Auto Blood Pressure (mmHg): 181/84 Accompanied By: family Schedule Follow-up Appointment: Yes Clinical Summary of Care: Electronic Signature(s) Signed: 02/12/2019 9:45:20 AM By: Harold Barban Entered By: Harold Barban on 02/12/2019 09:45:20 Hufford, [name] ([id number]) -------------------------------------------------------------------------------- Lower Extremity Assessment Details [name]. Date of Service: 02/12/2019 9:00 AM Medical Record Number: [id number] Patient Account Number: [id number] Date of Birth/Sex: 08-27-1926 (85 y.o. F) Treating RN: Montey Hora Primary Care Cassi Jenne: Derinda Late Other Clinician: Referring Linlee Cromie: BABAOFF, MARCUS Treating Cherie Lasalle/Extender: Ricard Dillon Weeks in Treatment: 4 Edema Assessment Assessed: [Left: No] [Right: No] Edema: [Left: N] [Right: o] Vascular Assessment Pulses: Dorsalis Pedis Palpable: [Left:Yes] Electronic Signature(s) Signed: 02/12/2019 2:28:39 PM By: Montey Hora Entered By: Montey Hora on 02/12/2019 09:22:17 Stallsmith, [name] ([id number]) -------------------------------------------------------------------------------- Multi Wound Chart Details [name]. Date of Service: 02/12/2019 9:00 AM Medical Record Number: [id number] Patient Account Number: [id number] Date of Birth/Sex: 10/28/1925 (85 y.o. F) Treating RN: Cornell Barman Primary Care Valeree Leidy: BABAOFF, MARCUS Other Clinician: Referring Hisashi Amadon: BABAOFF, MARCUS Treating Dantonio Justen/Extender: Ricard Dillon Weeks in Treatment: 4 Vital Signs Height(in): 64 Pulse(bpm): 93 Weight(lbs): 120 Blood Pressure(mmHg): 181/84 Body Mass Index(BMI): 21 Temperature(F): 97.8 Respiratory  Rate 18 (breaths/min): Photos: [N/A:N/A] Wound Location: Left Lower Leg - Anterior N/A N/A Wounding Event: Trauma N/A N/A Primary Etiology: Trauma, Other N/A N/A Comorbid History: Cataracts, Hypertension, N/A N/A Osteoarthritis, Dementia Date Acquired: 01/07/2019 N/A N/A Weeks of Treatment: 4 N/A N/A Wound Status: Open N/A N/A Measurements L x W x D 1.5x1.6x0.1 N/A N/A (cm) Area (cm) : 1.885 N/A N/A Volume (cm) : 0.188 N/A N/A % Reduction in Area: 50.00% N/A N/A % Reduction in Volume: 50.10% N/A N/A Classification: Partial Thickness N/A N/A Exudate Amount: Medium N/A N/A Exudate Type: Sanguinous N/A N/A Exudate Color: red N/A N/A Wound Margin: Flat and Intact N/A N/A Granulation  Amount: Small (1-33%) N/A N/A Granulation Quality: Red N/A N/A Necrotic Amount: Large (67-100%) N/A N/A Necrotic Tissue: Eschar, Adherent Slough N/A N/A Exposed Structures: Fat Layer (Subcutaneous N/A N/A Tissue) Exposed: Yes Fascia: No Tendon: No Muscle: No Alabi, Cassadie C. ([id number]) Joint: No Bone: No Epithelialization: None N/A N/A Debridement: Debridement - Excisional N/A N/A Pre-procedure 09:31 N/A N/A Verification/Time Out Taken: Pain Control: Lidocaine N/A N/A Tissue Debrided: Necrotic/Eschar, N/A N/A Subcutaneous, Slough Level: Skin/Subcutaneous Tissue N/A N/A Debridement Area (sq cm): 2.4 N/A N/A Instrument: Curette N/A N/A Bleeding: Minimum N/A N/A Hemostasis Achieved: Pressure N/A N/A Debridement Treatment Procedure was tolerated well N/A N/A Response: Post Debridement 1.5x1.6x0.2 N/A N/A Measurements L x W x D (cm) Post Debridement Volume: 0.377 N/A N/A (cm) Periwound Skin Texture: Excoriation: No N/A N/A Induration: No Callus: No Crepitus: No Rash: No Scarring: No Periwound Skin Moisture: Maceration: No N/A N/A Dry/Scaly: No Periwound Skin Color: Hemosiderin Staining: Yes N/A N/A Atrophie Blanche: No Cyanosis: No Ecchymosis: No Erythema: No Mottled:  No Pallor: No Rubor: No Temperature: No Abnormality N/A N/A Tenderness on Palpation: Yes N/A N/A Procedures Performed: Debridement N/A N/A Treatment Notes Wound #2 (Left, Anterior Lower Leg) Notes santyl, mepitel, ABD, conform, tubi grip Electronic Signature(s) Signed: 02/12/2019 3:44:07 PM By: Linton Ham MD Entered By: Linton Ham on 02/12/2019 09:48:52 Mccrae, [name] ([id number]) -------------------------------------------------------------------------------- Multi-Disciplinary Care Plan Details [name]. Date of Service: 02/12/2019 9:00 AM Medical Record Number: [id number] Patient Account Number: [id number] Date of Birth/Sex: [birth year] (85 y.o. F) Treating RN: Cornell Barman Primary Care Donnica Jarnagin: Derinda Late Other Clinician: Referring Amore Grater: BABAOFF, MARCUS Treating Mildreth Reek/Extender: Tito Dine in Treatment: 4 Active Inactive Abuse / Safety / Falls / Self Care Management Nursing Diagnoses: Potential for falls Goals: Patient will remain injury free related to falls Date Initiated: 01/14/2019 Target Resolution Date: 03/29/2019 Goal Status: Active Interventions: Assess fall risk on admission and as needed Notes: Necrotic Tissue Nursing Diagnoses: Impaired tissue integrity related to necrotic/devitalized tissue Goals: Patient/caregiver will verbalize understanding of reason and process for debridement of necrotic tissue Date Initiated: 01/21/2019 Target Resolution Date: 03/29/2019 Goal Status: Active Interventions: Provide education on necrotic tissue and debridement process Notes: Orientation to the Wound Care Program Nursing Diagnoses: Knowledge deficit related to the wound healing center program Goals: Patient/caregiver will verbalize understanding of the [facility] Date Initiated: 01/14/2019 Target Resolution Date: 03/29/2019 Goal Status: Active Interventions: Provide education on orientation to the  wound center JOVANNI, ECKHART. ([id number]) Notes: Pain, Acute or Chronic Nursing Diagnoses: Pain, acute or chronic: actual or potential Goals: Patient will verbalize adequate pain control and receive pain control interventions during procedures as needed Date Initiated: 01/21/2019 Target Resolution Date: 03/29/2019 Goal Status: Active Interventions: Complete pain assessment as per visit requirements Notes: Wound/Skin Impairment Nursing Diagnoses: Impaired tissue integrity Goals: Ulcer/skin breakdown will heal within 14 weeks Date Initiated: 01/14/2019 Target Resolution Date: 03/29/2019 Goal Status: Active Interventions: Assess patient/caregiver ability to obtain necessary supplies Assess patient/caregiver ability to perform ulcer/skin care regimen upon admission and as needed Assess ulceration(s) every visit Notes: Electronic Signature(s) Signed: 02/12/2019 4:31:15 PM By: Gretta Cool, BSN, RN, [name], Kim RN, BSN Entered By: Gretta Cool, BSN, RN, [name], Kim on 02/12/2019 09:29:33 [name] ([id number]) -------------------------------------------------------------------------------- Pain Assessment Details [name]. Date of Service: 02/12/2019 9:00 AM Medical Record Number: [id number] Patient Account Number: [id number] Date of Birth/Sex: 08/27/1926 (85 y.o. F) Treating RN: Cornell Barman Primary Care Kynisha Memon: Derinda Late Other Clinician: Referring Shreyas Piatkowski: BABAOFF,  MARCUS Treating Emanuelle Hammerstrom/Extender: Ricard Dillon Weeks in Treatment: 4 Active Problems Location of Pain Severity and Description of Pain Patient Has Paino Yes Site Locations Rate the pain. Current Pain Level: 10 Pain Management and Medication Current Pain Management: Electronic Signature(s) Signed: 02/12/2019 4:31:15 PM By: Gretta Cool, BSN, RN, [name], Kim RN, BSN Signed: 02/17/2019 3:31:41 PM By: Lorine Bears RCP, RRT, CHT Entered By: Lorine Bears on 02/12/2019  09:09:16 [name] ([id number]) -------------------------------------------------------------------------------- Patient/Caregiver Education Details [name] Date of Service: 02/12/2019 9:00 AM Medical Record Number: [id number] Patient Account Number: [id number] Date of Birth/Gender: 1926-09-14 (85 y.o. F) Treating RN: Cornell Barman Primary Care Physician: Derinda Late Other Clinician: Referring Physician: BABAOFF, MARCUS Treating Physician/Extender: Tito Dine in Treatment: 4 Education Assessment Education Provided To: Patient Education Topics Provided Wound/Skin Impairment: Handouts: Caring for Your Ulcer, Other: continue wound care as prescribed Methods: Demonstration Responses: State content correctly Electronic Signature(s) Signed: 02/12/2019 4:31:15 PM By: Gretta Cool, BSN, RN, [name], Kim RN, BSN Entered By: Gretta Cool, BSN, RN, [name], Kim on 02/12/2019 09:34:04 [name] ([id number]) -------------------------------------------------------------------------------- Wound Assessment Details Patient Name: CARNESHA, MARAVILLA. Date of Service: 02/12/2019 9:00 AM Medical Record Number: [id number] Patient Account Number: [id number] Date of Birth/Sex: 1926/07/07 (85 y.o. F) Treating RN: Montey Hora Primary Care Akeila Lana: BABAOFF, MARCUS Other Clinician: Referring Naser Schuld: BABAOFF, MARCUS Treating Dorthey Depace/Extender: Ricard Dillon Weeks in Treatment: 4 Wound Status Wound Number: 2 Primary Trauma, Other Etiology: Wound Location: Left Lower Leg - Anterior Wound Status: Open Wounding Event: Trauma Comorbid Cataracts, Hypertension, Osteoarthritis, Date Acquired: 01/07/2019 History: Dementia Weeks Of Treatment: 4 Clustered Wound: No Photos Wound Measurements Length: (cm) 1.5 % Reductio Width: (cm) 1.6 % Reductio Depth: (cm) 0.1 Epithelial Area: (cm) 1.885 Tunneling Volume: (cm) 0.188 Undermini n in Area: 50% n in Volume:  50.1% ization: None : No ng: No Wound Description Classification: Partial Thickness Foul Odor Wound Margin: Flat and Intact Slough/Fi Exudate Amount: Medium Exudate Type: Sanguinous Exudate Color: red After Cleansing: No brino No Wound Bed Granulation Amount: Small (1-33%) Exposed Structure Granulation Quality: Red Fascia Exposed: No Necrotic Amount: Large (67-100%) Fat Layer (Subcutaneous Tissue) Exposed: Yes Necrotic Quality: Eschar, Adherent Slough Tendon Exposed: No Muscle Exposed: No Joint Exposed: No Bone Exposed: No Periwound Skin Texture Texture Color Freas, Jermisha C. ([id number]) No Abnormalities Noted: No No Abnormalities Noted: No Callus: No Atrophie Blanche: No Crepitus: No Cyanosis: No Excoriation: No Ecchymosis: No Induration: No Erythema: No Rash: No Hemosiderin Staining: Yes Scarring: No Mottled: No Pallor: No Moisture Rubor: No No Abnormalities Noted: No Dry / Scaly: No Temperature / Pain Maceration: No Temperature: No Abnormality Tenderness on Palpation: Yes Treatment Notes Wound #2 (Left, Anterior Lower Leg) Notes santyl, mepitel, ABD, conform, tubi grip Electronic Signature(s) Signed: 02/12/2019 2:28:39 PM By: Montey Hora Entered By: Montey Hora on 02/12/2019 09:21:34 Wynn, [name] ([id number]) -------------------------------------------------------------------------------- Vitals Details [name]. Date of Service: 02/12/2019 9:00 AM Medical Record Number: [id number] Patient Account Number: [id number] Date of Birth/Sex: 1926-01-16 (85 y.o. F) Treating RN: Cornell Barman Primary Care Jaclene Bartelt: BABAOFF, MARCUS Other Clinician: Referring Cliford Sequeira: BABAOFF, MARCUS Treating Tremont Gavitt/Extender: Ricard Dillon Weeks in Treatment: 4 Vital Signs Time Taken: 09:09 Temperature (F): 97.8 Height (in): 64 Pulse (bpm): 93 Weight (lbs): 120 Respiratory Rate (breaths/min): 18 Body Mass Index (BMI): 20.6 Blood  Pressure (mmHg): 181/84 Reference Range: 80 - 120 mg / dl Electronic Signature(s) Signed: 02/17/2019 3:31:41 PM By: Becky Sax, Sallie RCP, RRT, CHT Entered By: Becky Sax,  Sallie on 02/12/2019 09:12:35

## 2019-02-19 ENCOUNTER — Other Ambulatory Visit: Payer: Self-pay

## 2019-02-19 ENCOUNTER — Encounter: Payer: Medicare Other | Admitting: Internal Medicine

## 2019-02-19 DIAGNOSIS — L97822 Non-pressure chronic ulcer of other part of left lower leg with fat layer exposed: Secondary | ICD-10-CM | POA: Diagnosis not present

## 2019-02-20 NOTE — Progress Notes (Signed)
[name] ([id number]) Visit Report for 02/19/2019 HPI Details [name], [name]. Date of Service: 02/19/2019 1:45 PM Medical Record Number: [id number] Patient Account Number: [id number] Date of Birth/Sex: Jul 31, 1926 (85 y.o. F) Treating RN: Harold Barban Primary Care Provider: Derinda Late Other Clinician: Referring Provider: BABAOFF, MARCUS Treating Provider/Extender: Tito Dine in Treatment: 5 History of Present Illness HPI Description: 06/06/17 on evaluation today patient presents for evaluation concerning an ulcer which she initially had arise as a result of striking her left lower extremity money bed frame. With that being said this was roughly one month ago and unfortunately the wound despite two courses of Keflex have not really made a dramatic improvement. This has continued to drain as well as being exquisitely tender at times. Even to the point that she is having a difficult time walking. Patient does have chronic atrial fibrillation which subsequently leads to her being on long-term anticoagulant therapy and this causes ED bruising which may have contributed to this entry and where things are at this point. She does have valvular heart disease as well as hypertension and her last INR was 2.7. Currently Neosporin, Gauls, and an ace wrap has been utilized. Keflex is the antibiotic that she has been on. Patient has not had a culture up to this point and she states that her blood pressure at the primary care office yesterday was 138/72. Her pain is significant related to be an eight out of 10. No fevers, chills, nausea, or vomiting noted at this time. 06/20/17; patient was admitted here 2 weeks ago. She had a traumatic wound on her left lateral lower leg probably in the setting of some degree of venous insufficiency with surrounding cellulitis. She had been on Keflex, after we saw her she was put on doxycycline which she apparently did not tolerate.  Culture that was done at the time showed Morganella which was resistant to Keflex. Apparently the patient continued to take Keflex after the appointment. She is also on Coumadin. I had tried to change her antibiotic when the Morganella culture was brought to my attention however apparently our staff could not reach the patient to inform them of the antibiotic change 06/26/17; patient has wound on her left lateral lower extremity in the setting of some degree of venous insufficiency. I gave her cefdinir last week and she is completing this. There is no evidence of surrounding infection. Aggressive debridement last week, wound bed looks healthier. We've been using Aquacel. They're traveling in the [place] next week we'll see her back in 2 weeks unless there are problems. 07/10/17; the patient is been using Aquacel Ag her husband is changing this with Kerlix and conform. She still complains of a lot of pain. Apparently with the antibiotics. We gave 2 or 3 weeks ago. Her INR went up to 6, this was managed by her primary physician 07/17/17; she is using Aquacel Ag and a border foam. Still discomfort although dimensions are better. 07/24/17; patient or for review of a trauma wound on her left anterior leg in the setting of some degree of chronic venous insufficiency she has been using Aquacel Ag and a border foam and making progress. 07/31/17; only a small open area of this original significant trauma is still open. Her husband is changing this every second day [Aquacel Ag] Readmission: 01/14/19 on evaluation today patient is that she seen for initial inspection during the office visit today concerning a trauma/skin tear to the left anterior lower extremity. This is roughly  the same region where she was previously treated and years past when she was here in the clinic. Nonetheless upon evaluation today the patient's wound actually showed signs of decent granulation around the edge although unfortunately the  skin flap that was torn back did fold under on itself and the flap itself is dying. She does have some eschar surrounded the edges of the wound at this point. Nonetheless this also was very tender for her. The patient does have a history of hypertension, atrial fibrillation, and long-term use anticoagulant therapy. 01/21/19 on evaluation today patient's wound on the anterior portion of her lower extremity appears to still be necrotic as far as the surface of the wound is concerned. Unfortunately there is no significant improvement at this point compared to last week they were not able to get the Santyl that are using Medihoney and maybe one reason why. The other is I think this may be stained to drive. Possibly adding something such as mepitel over top of the Medihoney may help to loosen this up quite a bit which I think would be in her best interest. Also she still has a lot of erythema and is very tender to touch I'm afraid that we DEEDRA, PRO. ([id number]) may need to switch to a different antibiotic to see if this will be of benefit there still really nothing that I can culture to get a better picture of what's going on and what we need to do from the standpoint of antibiotics. I do believe the patient needs a referral Talmuds me to vascular in order to evaluate her blood flow based on what I'm seeing they will be able to perform TBI testing if nothing else along with the vascular evaluation to see if there's any evidence for limited blood flow to this lower extremity. 4/8; I have reviewed the patient's arterial studies and they are really quite good. There should be no arterial impediments to healing the wound on the right anterior tibial area. 4/15; patient has a small open area over the left mid tibial area. Still requiring debridement we are using Santyl here and Medihoney at home [Santyl unaffordable] 4/22; difficult, presumably traumatic area over the left mid tibial area in the  setting of chronic venous insufficiency. We have been using Santyl here and meta honey at home. The wound surface is getting gradually better and slight improvement in dimensions. Her husband is changing this daily 4/29; left mid tibia presumably traumatic wound in the setting of chronic venous insufficiency. We have been using Santyl and a combination of Medihoney at home. Arrives today with a much better looking healthy granulated surface. We will change the primary dressing to [facility] Electronic Signature(s) Signed: 02/20/2019 7:17:18 AM By: Linton Ham MD Entered By: Linton Ham on 02/19/2019 14:49:19 Rando, [name] ([id number]) -------------------------------------------------------------------------------- Physical Exam Details Patient Name: MILLIANI, HERRADA C. Date of Service: 02/19/2019 1:45 PM Medical Record Number: [id number] Patient Account Number: [id number] Date of Birth/Sex: 10-11-1926 (85 y.o. F) Treating RN: Harold Barban Primary Care Provider: Derinda Late Other Clinician: Referring Provider: BABAOFF, MARCUS Treating Provider/Extender: Ricard Dillon Weeks in Treatment: 5 Constitutional Patient is hypertensive.. Pulse regular and within target range for patient.Marland Kitchen Respirations regular, non-labored and within target range.. Temperature is normal and within the target range for the patient.Marland Kitchen appears in no distress. Eyes Conjunctivae clear. No discharge. Respiratory Respiratory effort is easy and symmetric bilaterally. Rate is normal at rest and on room air.. Cardiovascular Pedal pulses palpable. Lymphatic None palpable in  the popliteal area bilaterally. Integumentary (Hair, Skin) Skin changes of chronic venous insufficiency. Psychiatric No evidence of depression, anxiety, or agitation. Calm, cooperative, and communicative. Appropriate interactions and affect.. Notes Wound examination; the wound is present on the left mid tibia. Circular wound  much better looking surface this week. Even under illumination this looks healthy no debridement is required. She has changes of chronic venous insufficiency with hemosiderin deposition however her edema is well controlled, pedal pulses are palpable Electronic Signature(s) Signed: 02/20/2019 7:17:18 AM By: Linton Ham MD Entered By: Linton Ham on 02/19/2019 14:51:08 [name] ([id number]) -------------------------------------------------------------------------------- Physician Orders Details Patient Name: [name]. Date of Service: 02/19/2019 1:45 PM Medical Record Number: [id number] Patient Account Number: [id number] Date of Birth/Sex: 1926-01-28 (85 y.o. F) Treating RN: Harold Barban Primary Care Provider: Derinda Late Other Clinician: Referring Provider: BABAOFF, MARCUS Treating Provider/Extender: Tito Dine in Treatment: 5 Verbal / Phone Orders: No Diagnosis Coding Wound Cleansing Wound #2 Left,Anterior Lower Leg o Clean wound with Normal Saline. o Cleanse wound with mild soap and water o May Shower, gently pat wound dry prior to applying new dressing. Anesthetic (add to Medication List) Wound #2 Left,Anterior Lower Leg o Topical Lidocaine 4% cream applied to wound bed prior to debridement (In Clinic Only). o Benzocaine Topical Anesthetic Spray applied to wound bed prior to debridement (In Clinic Only). Primary Wound Dressing Wound #2 Left,Anterior Lower Leg o Hydrafera Blue Ready Transfer Secondary Dressing Wound #2 Left,Anterior Lower Leg o ABD and Kerlix/Conform Dressing Change Frequency Wound #2 Left,Anterior Lower Leg o Change dressing every other day. Follow-up Appointments Wound #2 Left,Anterior Lower Leg o Return Appointment in 1 week. Edema Control Wound #2 Left,Anterior Lower Leg o Elevate legs to the level of the heart and pump ankles as often as possible o Other: - Tubigrip F Electronic  Signature(s) Signed: 02/19/2019 4:45:14 PM By: Harold Barban Signed: 02/20/2019 7:17:18 AM By: Linton Ham MD Entered By: Harold Barban on 02/19/2019 14:34:34 Bardin, [name] ([id number]) -------------------------------------------------------------------------------- Problem List Details Patient Name: CHANON, LONEY C. Date of Service: 02/19/2019 1:45 PM Medical Record Number: [id number] Patient Account Number: [id number] Date of Birth/Sex: Jun 18, 1926 (85 y.o. F) Treating RN: Harold Barban Primary Care Provider: Derinda Late Other Clinician: Referring Provider: BABAOFF, MARCUS Treating Provider/Extender: Tito Dine in Treatment: 5 Active Problems ICD-10 Evaluated Encounter Code Description Active Date Today Diagnosis S81.802A Unspecified open wound, left lower leg, initial encounter 01/14/2019 No Yes L97.822 Non-pressure chronic ulcer of other part of left lower leg with 01/14/2019 No Yes fat layer exposed I48.0 Paroxysmal atrial fibrillation 01/14/2019 No Yes Z79.01 Long term (current) use of anticoagulants 01/14/2019 No Yes I10 Essential (primary) hypertension 01/14/2019 No Yes Inactive Problems Resolved Problems Electronic Signature(s) Signed: 02/20/2019 7:17:18 AM By: Linton Ham MD Entered By: Linton Ham on 02/19/2019 14:48:29 Smigel, [name] ([id number]) -------------------------------------------------------------------------------- Progress Note Details Patient Name: [name]. Date of Service: 02/19/2019 1:45 PM Medical Record Number: [id number] Patient Account Number: [id number] Date of Birth/Sex: 09-11-1926 (85 y.o. F) Treating RN: Harold Barban Primary Care Provider: Derinda Late Other Clinician: Referring Provider: BABAOFF, MARCUS Treating Provider/Extender: Ricard Dillon Weeks in Treatment: 5 Subjective History of Present Illness (HPI) 06/06/17 on evaluation today patient presents for evaluation concerning an ulcer  which she initially had arise as a result of striking her left lower extremity money bed frame. With that being said this was roughly one month ago and unfortunately the wound despite two courses of Keflex have not really made a  dramatic improvement. This has continued to drain as well as being exquisitely tender at times. Even to the point that she is having a difficult time walking. Patient does have chronic atrial fibrillation which subsequently leads to her being on long-term anticoagulant therapy and this causes ED bruising which may have contributed to this entry and where things are at this point. She does have valvular heart disease as well as hypertension and her last INR was 2.7. Currently Neosporin, Gauls, and an ace wrap has been utilized. Keflex is the antibiotic that she has been on. Patient has not had a culture up to this point and she states that her blood pressure at the primary care office yesterday was 138/72. Her pain is significant related to be an eight out of 10. No fevers, chills, nausea, or vomiting noted at this time. 06/20/17; patient was admitted here 2 weeks ago. She had a traumatic wound on her left lateral lower leg probably in the setting of some degree of venous insufficiency with surrounding cellulitis. She had been on Keflex, after we saw her she was put on doxycycline which she apparently did not tolerate. Culture that was done at the time showed Morganella which was resistant to Keflex. Apparently the patient continued to take Keflex after the appointment. She is also on Coumadin. I had tried to change her antibiotic when the Morganella culture was brought to my attention however apparently our staff could not reach the patient to inform them of the antibiotic change 06/26/17; patient has wound on her left lateral lower extremity in the setting of some degree of venous insufficiency. I gave her cefdinir last week and she is completing this. There is no evidence of  surrounding infection. Aggressive debridement last week, wound bed looks healthier. We've been using Aquacel. They're traveling in the [place] next week we'll see her back in 2 weeks unless there are problems. 07/10/17; the patient is been using Aquacel Ag her husband is changing this with Kerlix and conform. She still complains of a lot of pain. Apparently with the antibiotics. We gave 2 or 3 weeks ago. Her INR went up to 6, this was managed by her primary physician 07/17/17; she is using Aquacel Ag and a border foam. Still discomfort although dimensions are better. 07/24/17; patient or for review of a trauma wound on her left anterior leg in the setting of some degree of chronic venous insufficiency she has been using Aquacel Ag and a border foam and making progress. 07/31/17; only a small open area of this original significant trauma is still open. Her husband is changing this every second day [Aquacel Ag] Readmission: 01/14/19 on evaluation today patient is that she seen for initial inspection during the office visit today concerning a trauma/skin tear to the left anterior lower extremity. This is roughly the same region where she was previously treated and years past when she was here in the clinic. Nonetheless upon evaluation today the patient's wound actually showed signs of decent granulation around the edge although unfortunately the skin flap that was torn back did fold under on itself and the flap itself is dying. She does have some eschar surrounded the edges of the wound at this point. Nonetheless this also was very tender for her. The patient does have a history of hypertension, atrial fibrillation, and long-term use anticoagulant therapy. 01/21/19 on evaluation today patient's wound on the anterior portion of her lower extremity appears to still be necrotic as far as the surface of the wound  is concerned. Unfortunately there is no significant improvement at this point compared to last  week they were not able to get the Santyl that are using Medihoney and maybe one reason why. The other is I think this may be stained to drive. Possibly adding something such as mepitel over top of the Medihoney may help to loosen this up quite a bit which I think would be in her best interest. Also she still has a lot of erythema and is very tender to touch I'm afraid that we may need to switch to a different antibiotic to see if this will be of benefit there still really nothing that I can culture to get a better picture of what's going on and what we need to do from the standpoint of antibiotics. I do believe the patient needs a referral Talmuds me to vascular in order to evaluate her blood flow based on what I'm seeing they will be able to perform TBI Pelfrey, Carlota C. ([id number]) testing if nothing else along with the vascular evaluation to see if there's any evidence for limited blood flow to this lower extremity. 4/8; I have reviewed the patient's arterial studies and they are really quite good. There should be no arterial impediments to healing the wound on the right anterior tibial area. 4/15; patient has a small open area over the left mid tibial area. Still requiring debridement we are using Santyl here and Medihoney at home [Santyl unaffordable] 4/22; difficult, presumably traumatic area over the left mid tibial area in the setting of chronic venous insufficiency. We have been using Santyl here and meta honey at home. The wound surface is getting gradually better and slight improvement in dimensions. Her husband is changing this daily 4/29; left mid tibia presumably traumatic wound in the setting of chronic venous insufficiency. We have been using Santyl and a combination of Medihoney at home. Arrives today with a much better looking healthy granulated surface. We will change the primary dressing to Hydrofera Blue Objective Constitutional Patient is hypertensive.. Pulse regular and  within target range for patient.Marland Kitchen Respirations regular, non-labored and within target range.. Temperature is normal and within the target range for the patient.Marland Kitchen appears in no distress. Vitals Time Taken: 1:55 PM, Height: 64 in, Weight: 120 lbs, BMI: 20.6, Temperature: 97.9 F, Pulse: 78 bpm, Respiratory Rate: 16 breaths/min, Blood Pressure: 163/70 mmHg. Eyes Conjunctivae clear. No discharge. Respiratory Respiratory effort is easy and symmetric bilaterally. Rate is normal at rest and on room air.. Cardiovascular Pedal pulses palpable. Lymphatic None palpable in the popliteal area bilaterally. Psychiatric No evidence of depression, anxiety, or agitation. Calm, cooperative, and communicative. Appropriate interactions and affect.. General Notes: Wound examination; the wound is present on the left mid tibia. Circular wound much better looking surface this week. Even under illumination this looks healthy no debridement is required. She has changes of chronic venous insufficiency with hemosiderin deposition however her edema is well controlled, pedal pulses are palpable Integumentary (Hair, Skin) Skin changes of chronic venous insufficiency. Wound #2 status is Open. Original cause of wound was Trauma. The wound is located on the Left,Anterior Lower Leg. The wound measures 1.2cm length x 1.6cm width x 0.1cm depth; 1.508cm^2 area and 0.151cm^3 volume. There is Fat Layer (Subcutaneous Tissue) Exposed exposed. There is no tunneling or undermining noted. There is a medium amount of sanguinous drainage noted. The wound margin is flat and intact. There is large (67-100%) red granulation within the wound bed. There is a small (1-33%) amount of necrotic  tissue within the wound bed including Adherent Slough. The periwound skin Sol, Atianna C. ([id number]) appearance exhibited: Hemosiderin Staining. The periwound skin appearance did not exhibit: Callus, Crepitus, Excoriation, Induration, Rash, Scarring,  Dry/Scaly, Maceration, Atrophie Blanche, Cyanosis, Ecchymosis, Mottled, Pallor, Rubor, Erythema. Periwound temperature was noted as No Abnormality. The periwound has tenderness on palpation. Assessment Active Problems ICD-10 Unspecified open wound, left lower leg, initial encounter Non-pressure chronic ulcer of other part of left lower leg with fat layer exposed Paroxysmal atrial fibrillation Long term (current) use of anticoagulants Essential (primary) hypertension Plan Wound Cleansing: Wound #2 Left,Anterior Lower Leg: Clean wound with Normal Saline. Cleanse wound with mild soap and water May Shower, gently pat wound dry prior to applying new dressing. Anesthetic (add to Medication List): Wound #2 Left,Anterior Lower Leg: Topical Lidocaine 4% cream applied to wound bed prior to debridement (In Clinic Only). Benzocaine Topical Anesthetic Spray applied to wound bed prior to debridement (In Clinic Only). Primary Wound Dressing: Wound #2 Left,Anterior Lower Leg: Hydrafera Blue Ready Transfer Secondary Dressing: Wound #2 Left,Anterior Lower Leg: ABD and Kerlix/Conform Dressing Change Frequency: Wound #2 Left,Anterior Lower Leg: Change dressing every other day. Follow-up Appointments: Wound #2 Left,Anterior Lower Leg: Return Appointment in 1 week. Edema Control: Wound #2 Left,Anterior Lower Leg: Elevate legs to the level of the heart and pump ankles as often as possible Other: - Tubigrip F 1. I change the primary dressing to [facility] will change this every second day. Same external Kerlix and conform Electronic Signature(s) NATALLIA, STELLMACH ([id number]) Signed: 02/20/2019 7:17:18 AM By: Linton Ham MD Entered By: Linton Ham on 02/19/2019 14:51:49 [name] ([id number]) -------------------------------------------------------------------------------- SuperBill Details Patient Name: [name] Date of Service: 02/19/2019 Medical Record Number:  [id number] Patient Account Number: [id number] Date of Birth/Sex: [birth year] (85 y.o. F) Treating RN: Harold Barban Primary Care Provider: BABAOFF, MARCUS Other Clinician: Referring Provider: BABAOFF, MARCUS Treating Provider/Extender: Ricard Dillon Weeks in Treatment: 5 Diagnosis Coding ICD-10 Codes Code Description S81.802A Unspecified open wound, left lower leg, initial encounter L97.822 Non-pressure chronic ulcer of other part of left lower leg with fat layer exposed I48.0 Paroxysmal atrial fibrillation Z79.01 Long term (current) use of anticoagulants I10 Essential (primary) hypertension Facility Procedures CPT4 Code: [id number] Description: [phone] - WOUND CARE VISIT-LEV 2 EST PT Modifier: Quantity: 1 Physician Procedures CPT4 Code Description: [id number] - WC PHYS LEVEL 3 - EST PT ICD-10 Diagnosis Description S81.802A Unspecified open wound, left lower leg, initial encounter L97.822 Non-pressure chronic ulcer of other part of left lower leg wit Modifier: h fat layer expos Quantity: 1 ed Electronic Signature(s) Signed: 02/20/2019 7:17:18 AM By: Linton Ham MD Entered By: Linton Ham on 02/19/2019 14:52:09

## 2019-02-20 NOTE — Progress Notes (Signed)
RONIA, HAZELETT ([id number]) Visit Report for 02/19/2019 Arrival Information Details Patient Name: JACQUITA, MULHEARN. Date of Service: 02/19/2019 1:45 PM Medical Record Number: [id number] Patient Account Number: [id number] Date of Birth/Sex: 1926-06-27 (85 y.o. F) Treating RN: Harold Barban Primary Care Artur Winningham: BABAOFF, MARCUS Other Clinician: Referring Kahliyah Dick: BABAOFF, MARCUS Treating Analia Zuk/Extender: Tito Dine in Treatment: 5 Visit Information History Since Last Visit Added or deleted any medications: No Patient Arrived: Ambulatory Any new allergies or adverse reactions: No Arrival Time: 13:53 Had a fall or experienced change in No Accompanied By: husband activities of daily living that may affect Transfer Assistance: None risk of falls: Patient Identification Verified: Yes Signs or symptoms of abuse/neglect since last visito No Secondary Verification Process Yes Hospitalized since last visit: No Completed: Implantable device outside of the clinic excluding No Patient Has Alerts: Yes cellular tissue based products placed in the center Patient Alerts: NEED ABI - TOO since last visit: PAINFUL Has Dressing in Place as Prescribed: Yes Pain Present Now: Yes Electronic Signature(s) Signed: 02/19/2019 2:30:22 PM By: Lorine Bears RCP, RRT, CHT Entered By: Lorine Bears on 02/19/2019 13:55:06 Aro, [name] ([id number]) -------------------------------------------------------------------------------- Clinic Level of Care Assessment Details Patient Name: [name]. Date of Service: 02/19/2019 1:45 PM Medical Record Number: [id number] Patient Account Number: [id number] Date of Birth/Sex: [birth year] (85 y.o. F) Treating RN: Harold Barban Primary Care Marlowe Cinquemani: BABAOFF, MARCUS Other Clinician: Referring Tayllor Breitenstein: BABAOFF, MARCUS Treating Kushal Saunders/Extender: Tito Dine in Treatment: 5 Clinic Level of Care  Assessment Items TOOL 4 Quantity Score []  - Use when only an EandM is performed on FOLLOW-UP visit 0 ASSESSMENTS - Nursing Assessment / Reassessment X - Reassessment of Co-morbidities (includes updates in patient status) 1 10 X- 1 5 Reassessment of Adherence to Treatment Plan ASSESSMENTS - Wound and Skin Assessment / Reassessment X - Simple Wound Assessment / Reassessment - one wound 1 5 []  - 0 Complex Wound Assessment / Reassessment - multiple wounds []  - 0 Dermatologic / Skin Assessment (not related to wound area) ASSESSMENTS - Focused Assessment []  - Circumferential Edema Measurements - multi extremities 0 []  - 0 Nutritional Assessment / Counseling / Intervention []  - 0 Lower Extremity Assessment (monofilament, tuning fork, pulses) []  - 0 Peripheral Arterial Disease Assessment (using hand held doppler) ASSESSMENTS - Ostomy and/or Continence Assessment and Care []  - Incontinence Assessment and Management 0 []  - 0 Ostomy Care Assessment and Management (repouching, etc.) PROCESS - Coordination of Care X - Simple Patient / Family Education for ongoing care 1 15 []  - 0 Complex (extensive) Patient / Family Education for ongoing care []  - 0 Staff obtains Programmer, systems, Records, Test Results / Process Orders []  - 0 Staff telephones HHA, Nursing Homes / Clarify orders / etc []  - 0 Routine Transfer to another Facility (non-emergent condition) []  - 0 Routine Hospital Admission (non-emergent condition) []  - 0 New Admissions / Biomedical engineer / Ordering NPWT, Apligraf, etc. []  - 0 Emergency Hospital Admission (emergent condition) X- 1 10 Simple Discharge Coordination BRITLEE, SKOLNIK. ([id number]) []  - 0 Complex (extensive) Discharge Coordination PROCESS - Special Needs []  - Pediatric / Minor Patient Management 0 []  - 0 Isolation Patient Management []  - 0 Hearing / Language / Visual special needs []  - 0 Assessment of Community assistance (transportation, D/C planning,  etc.) []  - 0 Additional assistance / Altered mentation []  - 0 Support Surface(s) Assessment (bed, cushion, seat, etc.) INTERVENTIONS - Wound Cleansing / Measurement X - Simple Wound Cleansing - one wound  1 5 []  - 0 Complex Wound Cleansing - multiple wounds X- 1 5 Wound Imaging (photographs - any number of wounds) []  - 0 Wound Tracing (instead of photographs) X- 1 5 Simple Wound Measurement - one wound []  - 0 Complex Wound Measurement - multiple wounds INTERVENTIONS - Wound Dressings X - Small Wound Dressing one or multiple wounds 1 10 []  - 0 Medium Wound Dressing one or multiple wounds []  - 0 Large Wound Dressing one or multiple wounds []  - 0 Application of Medications - topical []  - 0 Application of Medications - injection INTERVENTIONS - Miscellaneous []  - External ear exam 0 []  - 0 Specimen Collection (cultures, biopsies, blood, body fluids, etc.) []  - 0 Specimen(s) / Culture(s) sent or taken to Lab for analysis []  - 0 Patient Transfer (multiple staff / Civil Service fast streamer / Similar devices) []  - 0 Simple Staple / Suture removal (25 or less) []  - 0 Complex Staple / Suture removal (26 or more) []  - 0 Hypo / Hyperglycemic Management (close monitor of Blood Glucose) []  - 0 Ankle / Brachial Index (ABI) - do not check if billed separately X- 1 5 Vital Signs Liddell, Dena C. ([id number]) Has the patient been seen at the hospital within the last three years: Yes Total Score: 75 Level Of Care: New/Established - Level 2 Electronic Signature(s) Signed: 02/19/2019 4:45:14 PM By: Harold Barban Entered By: Harold Barban on 02/19/2019 14:35:05 Staiger, [name] ([id number]) -------------------------------------------------------------------------------- Encounter Discharge Information Details Patient Name: [name]. Date of Service: 02/19/2019 1:45 PM Medical Record Number: [id number] Patient Account Number: [id number] Date of Birth/Sex: 12-30-1925 (85 y.o.  F) Treating RN: Harold Barban Primary Care Tallia Moehring: Derinda Late Other Clinician: Referring Alfrieda Tarry: BABAOFF, MARCUS Treating Callahan Peddie/Extender: Tito Dine in Treatment: 5 Encounter Discharge Information Items Discharge Condition: Stable Ambulatory Status: Ambulatory Discharge Destination: Home Transportation: Private Auto Accompanied By: husband Schedule Follow-up Appointment: Yes Clinical Summary of Care: Electronic Signature(s) Signed: 02/19/2019 4:45:14 PM By: Harold Barban Entered By: Harold Barban on 02/19/2019 14:42:02 Ahlgrim, [name] ([id number]) -------------------------------------------------------------------------------- Lower Extremity Assessment Details Patient Name: [name]. Date of Service: 02/19/2019 1:45 PM Medical Record Number: [id number] Patient Account Number: [id number] Date of Birth/Sex: Mar 06, 1926 (85 y.o. F) Treating RN: Montey Hora Primary Care Shyler Hamill: Derinda Late Other Clinician: Referring Tyton Abdallah: BABAOFF, MARCUS Treating Mekaylah Klich/Extender: Ricard Dillon Weeks in Treatment: 5 Edema Assessment Assessed: [Left: No] [Right: No] Edema: [Left: N] [Right: o] Vascular Assessment Pulses: Dorsalis Pedis Palpable: [Left:Yes] Electronic Signature(s) Signed: 02/19/2019 4:42:28 PM By: Montey Hora Entered By: Montey Hora on 02/19/2019 14:07:25 Sladek, [name] ([id number]) -------------------------------------------------------------------------------- Multi Wound Chart Details Patient Name: [name]. Date of Service: 02/19/2019 1:45 PM Medical Record Number: [id number] Patient Account Number: [id number] Date of Birth/Sex: 01/20/1926 (85 y.o. F) Treating RN: Harold Barban Primary Care Keelyn Fjelstad: BABAOFF, MARCUS Other Clinician: Referring Dimitry Holsworth: BABAOFF, MARCUS Treating Diera Wirkkala/Extender: Ricard Dillon Weeks in Treatment: 5 Vital Signs Height(in): 64 Pulse(bpm): 78 Weight(lbs):  120 Blood Pressure(mmHg): 163/70 Body Mass Index(BMI): 21 Temperature(F): 97.9 Respiratory Rate 16 (breaths/min): Photos: [N/A:N/A] Wound Location: Left Lower Leg - Anterior N/A N/A Wounding Event: Trauma N/A N/A Primary Etiology: Trauma, Other N/A N/A Comorbid History: Cataracts, Hypertension, N/A N/A Osteoarthritis, Dementia Date Acquired: 01/07/2019 N/A N/A Weeks of Treatment: 5 N/A N/A Wound Status: Open N/A N/A Measurements L x W x D 1.2x1.6x0.1 N/A N/A (cm) Area (cm) : 1.508 N/A N/A Volume (cm) : 0.151 N/A N/A % Reduction in Area: 60.00% N/A N/A % Reduction in Volume:  59.90% N/A N/A Classification: Partial Thickness N/A N/A Exudate Amount: Medium N/A N/A Exudate Type: Sanguinous N/A N/A Exudate Color: red N/A N/A Wound Margin: Flat and Intact N/A N/A Granulation Amount: Large (67-100%) N/A N/A Granulation Quality: Red N/A N/A Necrotic Amount: Small (1-33%) N/A N/A Exposed Structures: Fat Layer (Subcutaneous N/A N/A Tissue) Exposed: Yes Fascia: No Tendon: No Muscle: No Joint: No Bone: No Monnig, Auriella C. ([id number]) Epithelialization: Small (1-33%) N/A N/A Periwound Skin Texture: Excoriation: No N/A N/A Induration: No Callus: No Crepitus: No Rash: No Scarring: No Periwound Skin Moisture: Maceration: No N/A N/A Dry/Scaly: No Periwound Skin Color: Hemosiderin Staining: Yes N/A N/A Atrophie Blanche: No Cyanosis: No Ecchymosis: No Erythema: No Mottled: No Pallor: No Rubor: No Temperature: No Abnormality N/A N/A Tenderness on Palpation: Yes N/A N/A Treatment Notes Wound #2 (Left, Anterior Lower Leg) Notes Hydrfera blue ABD, conform, tubi grip Electronic Signature(s) Signed: 02/20/2019 7:17:18 AM By: Linton Ham MD Entered By: Linton Ham on 02/19/2019 14:48:35 Broadnax, [name] ([id number]) -------------------------------------------------------------------------------- Multi-Disciplinary Care Plan Details Patient Name: [name]. Date of Service: 02/19/2019 1:45 PM Medical Record Number: [id number] Patient Account Number: [id number] Date of Birth/Sex: [birth year] (85 y.o. F) Treating RN: Harold Barban Primary Care Chas Axel: Derinda Late Other Clinician: Referring Makila Colombe: BABAOFF, MARCUS Treating Loredana Medellin/Extender: Tito Dine in Treatment: 5 Active Inactive Abuse / Safety / Falls / Self Care Management Nursing Diagnoses: Potential for falls Goals: Patient will remain injury free related to falls Date Initiated: 01/14/2019 Target Resolution Date: 03/29/2019 Goal Status: Active Interventions: Assess fall risk on admission and as needed Notes: Necrotic Tissue Nursing Diagnoses: Impaired tissue integrity related to necrotic/devitalized tissue Goals: Patient/caregiver will verbalize understanding of reason and process for debridement of necrotic tissue Date Initiated: 01/21/2019 Target Resolution Date: 03/29/2019 Goal Status: Active Interventions: Provide education on necrotic tissue and debridement process Notes: Orientation to the Wound Care Program Nursing Diagnoses: Knowledge deficit related to the wound healing center program Goals: Patient/caregiver will verbalize understanding of the [place] Program Date Initiated: 01/14/2019 Target Resolution Date: 03/29/2019 Goal Status: Active Interventions: Provide education on orientation to the wound center AUBRII, SHARPLESS. ([id number]) Notes: Pain, Acute or Chronic Nursing Diagnoses: Pain, acute or chronic: actual or potential Goals: Patient will verbalize adequate pain control and receive pain control interventions during procedures as needed Date Initiated: 01/21/2019 Target Resolution Date: 03/29/2019 Goal Status: Active Interventions: Complete pain assessment as per visit requirements Notes: Wound/Skin Impairment Nursing Diagnoses: Impaired tissue integrity Goals: Ulcer/skin breakdown will heal within 14  weeks Date Initiated: 01/14/2019 Target Resolution Date: 03/29/2019 Goal Status: Active Interventions: Assess patient/caregiver ability to obtain necessary supplies Assess patient/caregiver ability to perform ulcer/skin care regimen upon admission and as needed Assess ulceration(s) every visit Notes: Electronic Signature(s) Signed: 02/19/2019 4:45:14 PM By: Harold Barban Entered By: Harold Barban on 02/19/2019 14:32:52 Stepien, [name] ([id number]) -------------------------------------------------------------------------------- Pain Assessment Details Patient Name: [name]. Date of Service: 02/19/2019 1:45 PM Medical Record Number: [id number] Patient Account Number: [id number] Date of Birth/Sex: June 30, 1926 (85 y.o. F) Treating RN: Harold Barban Primary Care Jaydence Arnesen: Derinda Late Other Clinician: Referring Sofija Antwi: BABAOFF, MARCUS Treating Sheran Newstrom/Extender: Ricard Dillon Weeks in Treatment: 5 Active Problems Location of Pain Severity and Description of Pain Patient Has Paino Yes Site Locations Rate the pain. Current Pain Level: 5 Pain Management and Medication Current Pain Management: Electronic Signature(s) Signed: 02/19/2019 2:30:22 PM By: Paulla Fore, RRT, CHT Signed: 02/19/2019 4:45:14 PM By: Harold Barban Entered By: Lorine Bears on  02/19/2019 13:55:33 NAEVIA, UNTERREINER ([id number]) -------------------------------------------------------------------------------- Patient/Caregiver Education Details Patient Name: KAMALJIT, HIZER. Date of Service: 02/19/2019 1:45 PM Medical Record Number: [id number] Patient Account Number: [id number] Date of Birth/Gender: 12-28-1925 (85 y.o. F) Treating RN: Harold Barban Primary Care Physician: Derinda Late Other Clinician: Referring Physician: BABAOFF, MARCUS Treating Physician/Extender: Tito Dine in Treatment: 5 Education Assessment Education Provided  To: Patient Education Topics Provided Wound/Skin Impairment: Handouts: Caring for Your Ulcer Methods: Demonstration, Explain/Verbal Responses: State content correctly Electronic Signature(s) Signed: 02/19/2019 4:45:14 PM By: Harold Barban Entered By: Harold Barban on 02/19/2019 14:33:15 Levey, [name] ([id number]) -------------------------------------------------------------------------------- Wound Assessment Details Patient Name: Thomes Lolling C. Date of Service: 02/19/2019 1:45 PM Medical Record Number: [id number] Patient Account Number: [id number] Date of Birth/Sex: August 29, 1926 (85 y.o. F) Treating RN: Montey Hora Primary Care Jnai Snellgrove: BABAOFF, MARCUS Other Clinician: Referring Leanne Sisler: BABAOFF, MARCUS Treating Ryenn Howeth/Extender: Ricard Dillon Weeks in Treatment: 5 Wound Status Wound Number: 2 Primary Trauma, Other Etiology: Wound Location: Left Lower Leg - Anterior Wound Status: Open Wounding Event: Trauma Comorbid Cataracts, Hypertension, Osteoarthritis, Date Acquired: 01/07/2019 History: Dementia Weeks Of Treatment: 5 Clustered Wound: No Photos Wound Measurements Length: (cm) 1.2 % Reduction Width: (cm) 1.6 % Reduction Depth: (cm) 0.1 Epithelializ Area: (cm) 1.508 Tunneling: Volume: (cm) 0.151 Undermining in Area: 60% in Volume: 59.9% ation: Small (1-33%) No : No Wound Description Classification: Partial Thickness Foul Odor A Wound Margin: Flat and Intact Slough/Fibr Exudate Amount: Medium Exudate Type: Sanguinous Exudate Color: red fter Cleansing: No ino No Wound Bed Granulation Amount: Large (67-100%) Exposed Structure Granulation Quality: Red Fascia Exposed: No Necrotic Amount: Small (1-33%) Fat Layer (Subcutaneous Tissue) Exposed: Yes Necrotic Quality: Adherent Slough Tendon Exposed: No Muscle Exposed: No Joint Exposed: No Bone Exposed: No Periwound Skin Texture Texture Color Formisano, Meryn C. ([id number]) No Abnormalities  Noted: No No Abnormalities Noted: No Callus: No Atrophie Blanche: No Crepitus: No Cyanosis: No Excoriation: No Ecchymosis: No Induration: No Erythema: No Rash: No Hemosiderin Staining: Yes Scarring: No Mottled: No Pallor: No Moisture Rubor: No No Abnormalities Noted: No Dry / Scaly: No Temperature / Pain Maceration: No Temperature: No Abnormality Tenderness on Palpation: Yes Treatment Notes Wound #2 (Left, Anterior Lower Leg) Notes Hydrfera blue ABD, conform, tubi grip Electronic Signature(s) Signed: 02/19/2019 4:42:28 PM By: Montey Hora Entered By: Montey Hora on 02/19/2019 14:07:09 Feliciano, [name] ([id number]) -------------------------------------------------------------------------------- Vitals Details Patient Name: [name]. Date of Service: 02/19/2019 1:45 PM Medical Record Number: [id number] Patient Account Number: [id number] Date of Birth/Sex: 1926/02/25 (85 y.o. F) Treating RN: Harold Barban Primary Care Jalin Alicea: BABAOFF, MARCUS Other Clinician: Referring Earvin Blazier: BABAOFF, MARCUS Treating Dicy Smigel/Extender: Ricard Dillon Weeks in Treatment: 5 Vital Signs Time Taken: 13:55 Temperature (F): 97.9 Height (in): 64 Pulse (bpm): 78 Weight (lbs): 120 Respiratory Rate (breaths/min): 16 Body Mass Index (BMI): 20.6 Blood Pressure (mmHg): 163/70 Reference Range: 80 - 120 mg / dl Electronic Signature(s) Signed: 02/19/2019 2:30:22 PM By: Lorine Bears RCP, RRT, CHT Entered By: Lorine Bears on 02/19/2019 14:00:13

## 2019-02-26 ENCOUNTER — Other Ambulatory Visit: Payer: Self-pay

## 2019-02-26 ENCOUNTER — Encounter: Payer: Medicare Other | Attending: Internal Medicine | Admitting: Internal Medicine

## 2019-02-26 DIAGNOSIS — L97822 Non-pressure chronic ulcer of other part of left lower leg with fat layer exposed: Secondary | ICD-10-CM | POA: Diagnosis not present

## 2019-02-26 DIAGNOSIS — S81802D Unspecified open wound, left lower leg, subsequent encounter: Secondary | ICD-10-CM | POA: Diagnosis present

## 2019-02-26 DIAGNOSIS — W2203XD Walked into furniture, subsequent encounter: Secondary | ICD-10-CM | POA: Diagnosis not present

## 2019-02-26 DIAGNOSIS — I480 Paroxysmal atrial fibrillation: Secondary | ICD-10-CM | POA: Insufficient documentation

## 2019-02-26 DIAGNOSIS — Z7901 Long term (current) use of anticoagulants: Secondary | ICD-10-CM | POA: Diagnosis not present

## 2019-02-26 DIAGNOSIS — I872 Venous insufficiency (chronic) (peripheral): Secondary | ICD-10-CM | POA: Insufficient documentation

## 2019-02-26 DIAGNOSIS — I10 Essential (primary) hypertension: Secondary | ICD-10-CM | POA: Diagnosis not present

## 2019-02-27 NOTE — Progress Notes (Signed)
Brittany, [name] ([id number]) Visit Report for 02/26/2019 Arrival Information Details [name], [name]. Date of Service: 02/26/2019 1:45 PM Medical Record Number: [id number] Patient Account Number: [id number] Date of Birth/Sex: 22-Jan-1926 (85 y.o. F) Treating RN: Harold Barban Primary Care Siddarth Hsiung: BABAOFF, MARCUS Other Clinician: Referring Ren Grasse: BABAOFF, MARCUS Treating Erie Sica/Extender: Tito Dine in Treatment: 6 Visit Information History Since Last Visit Added or deleted any medications: No Patient Arrived: Ambulatory Any new allergies or adverse reactions: No Arrival Time: 13:38 Had a fall or experienced change in No Accompanied By: husband activities of daily living that may affect Transfer Assistance: None risk of falls: Patient Identification Verified: Yes Signs or symptoms of abuse/neglect since last visito No Secondary Verification Process Yes Hospitalized since last visit: No Completed: Has Dressing in Place as Prescribed: Yes Patient Has Alerts: Yes Pain Present Now: No Patient Alerts: NEED ABI - TOO PAINFUL Electronic Signature(s) Signed: 02/27/2019 8:05:03 AM By: Harold Barban Entered By: Harold Barban on 02/26/2019 13:38:57 Loll, [name] ([id number]) -------------------------------------------------------------------------------- Clinic Level of Care Assessment Details [name] Date of Service: 02/26/2019 1:45 PM Medical Record Number: [id number] Patient Account Number: [id number] Date of Birth/Sex: June 20, 1926 (85 y.o. F) Treating RN: Cornell Barman Primary Care Terra Aveni: BABAOFF, MARCUS Other Clinician: Referring Clemencia Helzer: BABAOFF, MARCUS Treating Graciela Plato/Extender: Tito Dine in Treatment: 6 Clinic Level of Care Assessment Items TOOL 4 Quantity Score []  - Use when only an EandM is performed on FOLLOW-UP visit 0 ASSESSMENTS - Nursing Assessment / Reassessment []  - Reassessment of  Co-morbidities (includes updates in patient status) 0 X- 1 5 Reassessment of Adherence to Treatment Plan ASSESSMENTS - Wound and Skin Assessment / Reassessment X - Simple Wound Assessment / Reassessment - one wound 1 5 []  - 0 Complex Wound Assessment / Reassessment - multiple wounds []  - 0 Dermatologic / Skin Assessment (not related to wound area) ASSESSMENTS - Focused Assessment []  - Circumferential Edema Measurements - multi extremities 0 []  - 0 Nutritional Assessment / Counseling / Intervention []  - 0 Lower Extremity Assessment (monofilament, tuning fork, pulses) []  - 0 Peripheral Arterial Disease Assessment (using hand held doppler) ASSESSMENTS - Ostomy and/or Continence Assessment and Care []  - Incontinence Assessment and Management 0 []  - 0 Ostomy Care Assessment and Management (repouching, etc.) PROCESS - Coordination of Care X - Simple Patient / Family Education for ongoing care 1 15 []  - 0 Complex (extensive) Patient / Family Education for ongoing care []  - 0 Staff obtains Programmer, systems, Records, Test Results / Process Orders []  - 0 Staff telephones HHA, Nursing Homes / Clarify orders / etc []  - 0 Routine Transfer to another Facility (non-emergent condition) []  - 0 Routine Hospital Admission (non-emergent condition) []  - 0 New Admissions / Biomedical engineer / Ordering NPWT, Apligraf, etc. []  - 0 Emergency Hospital Admission (emergent condition) X- 1 10 Simple Discharge Coordination GELENE, RECKTENWALD. ([id number]) []  - 0 Complex (extensive) Discharge Coordination PROCESS - Special Needs []  - Pediatric / Minor Patient Management 0 []  - 0 Isolation Patient Management []  - 0 Hearing / Language / Visual special needs []  - 0 Assessment of Community assistance (transportation, D/C planning, etc.) []  - 0 Additional assistance / Altered mentation []  - 0 Support Surface(s) Assessment (bed, cushion, seat, etc.) INTERVENTIONS - Wound Cleansing / Measurement X -  Simple Wound Cleansing - one wound 1 5 []  - 0 Complex Wound Cleansing - multiple wounds X- 1 5 Wound Imaging (photographs - any number of wounds) []  - 0  Wound Tracing (instead of photographs) X- 1 5 Simple Wound Measurement - one wound []  - 0 Complex Wound Measurement - multiple wounds INTERVENTIONS - Wound Dressings []  - Small Wound Dressing one or multiple wounds 0 X- 1 15 Medium Wound Dressing one or multiple wounds []  - 0 Large Wound Dressing one or multiple wounds []  - 0 Application of Medications - topical []  - 0 Application of Medications - injection INTERVENTIONS - Miscellaneous []  - External ear exam 0 []  - 0 Specimen Collection (cultures, biopsies, blood, body fluids, etc.) []  - 0 Specimen(s) / Culture(s) sent or taken to Lab for analysis []  - 0 Patient Transfer (multiple staff / Civil Service fast streamer / Similar devices) []  - 0 Simple Staple / Suture removal (25 or less) []  - 0 Complex Staple / Suture removal (26 or more) []  - 0 Hypo / Hyperglycemic Management (close monitor of Blood Glucose) []  - 0 Ankle / Brachial Index (ABI) - do not check if billed separately X- 1 5 Vital Signs Josten, Ainslee C. ([id number]) Has the patient been seen at the hospital within the last three years: Yes Total Score: 70 Level Of Care: New/Established - Level 2 Electronic Signature(s) Signed: 02/26/2019 5:13:19 PM By: Gretta Cool, BSN, RN, [name], Kim RN, BSN Entered By: Gretta Cool, BSN, RN, [name], Kim on 02/26/2019 13:58:21 [name] ([id number]) -------------------------------------------------------------------------------- Encounter Discharge Information Details [name]. Date of Service: 02/26/2019 1:45 PM Medical Record Number: [id number] Patient Account Number: [id number] Date of Birth/Sex: [birth year] (85 y.o. F) Treating RN: Cornell Barman Primary Care Fiorela Pelzer: Derinda Late Other Clinician: Referring Luzelena Heeg: BABAOFF, MARCUS Treating Dalyn Becker/Extender: Tito Dine in Treatment: 6 Encounter Discharge Information Items Discharge Condition: Stable Ambulatory Status: Ambulatory Discharge Destination: Home Transportation: Private Auto Accompanied By: self Schedule Follow-up Appointment: Yes Clinical Summary of Care: Electronic Signature(s) Signed: 02/26/2019 5:13:19 PM By: Gretta Cool, BSN, RN, [name], Kim RN, BSN Entered By: Gretta Cool, BSN, RN, [name], Kim on 02/26/2019 13:59:02 [name] ([id number]) -------------------------------------------------------------------------------- Lower Extremity Assessment Details Patient Name: MEHGAN, SANTMYER. Date of Service: 02/26/2019 1:45 PM Medical Record Number: [id number] Patient Account Number: [id number] Date of Birth/Sex: 08-26-1926 (85 y.o. F) Treating RN: Harold Barban Primary Care Jazz Biddy: Derinda Late Other Clinician: Referring Aliceson Dolbow: BABAOFF, MARCUS Treating Biddie Sebek/Extender: Ricard Dillon Weeks in Treatment: 6 Vascular Assessment Pulses: Dorsalis Pedis Palpable: [Left:Yes] Posterior Tibial Palpable: [Left:Yes] Electronic Signature(s) Signed: 02/27/2019 8:05:03 AM By: Harold Barban Entered By: Harold Barban on 02/26/2019 13:46:26 Kimberley, [name] ([id number]) -------------------------------------------------------------------------------- Multi Wound Chart Details [name]. Date of Service: 02/26/2019 1:45 PM Medical Record Number: [id number] Patient Account Number: [id number] Date of Birth/Sex: [birth year] (85 y.o. F) Treating RN: Cornell Barman Primary Care Alvah Gilder: BABAOFF, MARCUS Other Clinician: Referring Leara Rawl: BABAOFF, MARCUS Treating Nakia Koble/Extender: Ricard Dillon Weeks in Treatment: 6 Vital Signs Height(in): 64 Pulse(bpm): 80 Weight(lbs): 120 Blood Pressure(mmHg): 157/73 Body Mass Index(BMI): 21 Temperature(F): 98.8 Respiratory Rate 16 (breaths/min): Photos: [N/A:N/A] Wound Location: Left Lower Leg - Anterior N/A  N/A Wounding Event: Trauma N/A N/A Primary Etiology: Trauma, Other N/A N/A Comorbid History: Cataracts, Hypertension, N/A N/A Osteoarthritis, Dementia Date Acquired: 01/07/2019 N/A N/A Weeks of Treatment: 6 N/A N/A Wound Status: Open N/A N/A Measurements L x W x D 1x1.4x0.1 N/A N/A (cm) Area (cm) : 1.1 N/A N/A Volume (cm) : 0.11 N/A N/A % Reduction in Area: 70.80% N/A N/A % Reduction in Volume: 70.80% N/A N/A Classification: Partial Thickness N/A N/A Exudate Amount: Medium N/A N/A Exudate Type: Sanguinous N/A N/A  Exudate Color: red N/A N/A Wound Margin: Flat and Intact N/A N/A Granulation Amount: Large (67-100%) N/A N/A Granulation Quality: Red N/A N/A Necrotic Amount: Small (1-33%) N/A N/A Exposed Structures: Fat Layer (Subcutaneous N/A N/A Tissue) Exposed: Yes Fascia: No Tendon: No Muscle: No Joint: No Bone: No Nunn, Jamiracle C. ([id number]) Epithelialization: Small (1-33%) N/A N/A Periwound Skin Texture: Excoriation: No N/A N/A Induration: No Callus: No Crepitus: No Rash: No Scarring: No Periwound Skin Moisture: Maceration: No N/A N/A Dry/Scaly: No Periwound Skin Color: Hemosiderin Staining: Yes N/A N/A Atrophie Blanche: No Cyanosis: No Ecchymosis: No Erythema: No Mottled: No Pallor: No Rubor: No Temperature: No Abnormality N/A N/A Tenderness on Palpation: Yes N/A N/A Treatment Notes Wound #2 (Left, Anterior Lower Leg) Notes Hydrfera blue ABD, conform Electronic Signature(s) Signed: 02/26/2019 4:51:59 PM By: Linton Ham MD Entered By: Linton Ham on 02/26/2019 14:17:44 Norwood, [name] ([id number]) -------------------------------------------------------------------------------- Multi-Disciplinary Care Plan Details [name]. Date of Service: 02/26/2019 1:45 PM Medical Record Number: [id number] Patient Account Number: [id number] Date of Birth/Sex: Mar 18, 1926 (85 y.o. F) Treating RN: Cornell Barman Primary Care Lamoyne Palencia:  Derinda Late Other Clinician: Referring Antrice Pal: BABAOFF, MARCUS Treating Aneta Hendershott/Extender: Tito Dine in Treatment: 6 Active Inactive Abuse / Safety / Falls / Self Care Management Nursing Diagnoses: Potential for falls Goals: Patient will remain injury free related to falls Date Initiated: 01/14/2019 Target Resolution Date: 03/29/2019 Goal Status: Active Interventions: Assess fall risk on admission and as needed Notes: Necrotic Tissue Nursing Diagnoses: Impaired tissue integrity related to necrotic/devitalized tissue Goals: Patient/caregiver will verbalize understanding of reason and process for debridement of necrotic tissue Date Initiated: 01/21/2019 Target Resolution Date: 03/29/2019 Goal Status: Active Interventions: Provide education on necrotic tissue and debridement process Notes: Orientation to the Wound Care Program Nursing Diagnoses: Knowledge deficit related to the wound healing center program Goals: Patient/caregiver will verbalize understanding of the [facility] Date Initiated: 01/14/2019 Target Resolution Date: 03/29/2019 Goal Status: Active Interventions: Provide education on orientation to the wound center KRISTALYNN, CODDINGTON. ([id number]) Notes: Pain, Acute or Chronic Nursing Diagnoses: Pain, acute or chronic: actual or potential Goals: Patient will verbalize adequate pain control and receive pain control interventions during procedures as needed Date Initiated: 01/21/2019 Target Resolution Date: 03/29/2019 Goal Status: Active Interventions: Complete pain assessment as per visit requirements Notes: Wound/Skin Impairment Nursing Diagnoses: Impaired tissue integrity Goals: Ulcer/skin breakdown will heal within 14 weeks Date Initiated: 01/14/2019 Target Resolution Date: 03/29/2019 Goal Status: Active Interventions: Assess patient/caregiver ability to obtain necessary supplies Assess patient/caregiver ability to perform  ulcer/skin care regimen upon admission and as needed Assess ulceration(s) every visit Notes: Electronic Signature(s) Signed: 02/26/2019 5:13:19 PM By: Gretta Cool, BSN, RN, [name], Kim RN, BSN Entered By: Gretta Cool, BSN, RN, [name], Kim on 02/26/2019 13:52:52 Pink, [name] ([id number]) -------------------------------------------------------------------------------- Pain Assessment Details [name]. Date of Service: 02/26/2019 1:45 PM Medical Record Number: [id number] Patient Account Number: [id number] Date of Birth/Sex: 1926/07/22 (85 y.o. F) Treating RN: Harold Barban Primary Care Sharmaine Bain: Derinda Late Other Clinician: Referring Rechelle Niebla: BABAOFF, MARCUS Treating Khamora Karan/Extender: Ricard Dillon Weeks in Treatment: 6 Active Problems Location of Pain Severity and Description of Pain Patient Has Paino No Site Locations Pain Management and Medication Current Pain Management: Electronic Signature(s) Signed: 02/27/2019 8:05:03 AM By: Harold Barban Entered By: Harold Barban on 02/26/2019 13:39:03 Soliday, [name] ([id number]) -------------------------------------------------------------------------------- Wound Assessment Details [name]. Date of Service: 02/26/2019 1:45 PM Medical Record Number: [id number] Patient Account Number: [id number] Date of  Birth/Sex: [birth year] (85 y.o. F) Treating RN: Harold Barban Primary Care Brandis Matsuura: BABAOFF, MARCUS Other Clinician: Referring Ival Basquez: BABAOFF, MARCUS Treating Mady Oubre/Extender: Ricard Dillon Weeks in Treatment: 6 Wound Status Wound Number: 2 Primary Trauma, Other Etiology: Wound Location: Left Lower Leg - Anterior Wound Status: Open Wounding Event: Trauma Comorbid Cataracts, Hypertension, Osteoarthritis, Date Acquired: 01/07/2019 History: Dementia Weeks Of Treatment: 6 Clustered Wound: No Photos Wound Measurements Length: (cm) 1 % Reduct Width: (cm) 1.4 % Reduct Depth: (cm)  0.1 Epitheli Area: (cm) 1.1 Tunneli Volume: (cm) 0.11 Undermi ion in Area: 70.8% ion in Volume: 70.8% alization: Small (1-33%) ng: No ning: No Wound Description Classification: Partial Thickness Foul Od Wound Margin: Flat and Intact Slough/ Exudate Amount: Medium Exudate Type: Sanguinous Exudate Color: red or After Cleansing: No Fibrino No Wound Bed Granulation Amount: Large (67-100%) Exposed Structure Granulation Quality: Red Fascia Exposed: No Necrotic Amount: Small (1-33%) Fat Layer (Subcutaneous Tissue) Exposed: Yes Necrotic Quality: Adherent Slough Tendon Exposed: No Muscle Exposed: No Joint Exposed: No Bone Exposed: No Periwound Skin Texture Texture Color Nill, Arletha C. ([id number]) No Abnormalities Noted: No No Abnormalities Noted: No Callus: No Atrophie Blanche: No Crepitus: No Cyanosis: No Excoriation: No Ecchymosis: No Induration: No Erythema: No Rash: No Hemosiderin Staining: Yes Scarring: No Mottled: No Pallor: No Moisture Rubor: No No Abnormalities Noted: No Dry / Scaly: No Temperature / Pain Maceration: No Temperature: No Abnormality Tenderness on Palpation: Yes Treatment Notes Wound #2 (Left, Anterior Lower Leg) Notes Hydrfera blue ABD, conform Electronic Signature(s) Signed: 02/27/2019 8:05:03 AM By: Harold Barban Entered By: Harold Barban on 02/26/2019 13:45:11 Skop, [name] ([id number]) -------------------------------------------------------------------------------- Vitals Details [name]. Date of Service: 02/26/2019 1:45 PM Medical Record Number: [id number] Patient Account Number: [id number] Date of Birth/Sex: November 24, 1925 (85 y.o. F) Treating RN: Harold Barban Primary Care Bluma Buresh: BABAOFF, MARCUS Other Clinician: Referring Timberlyn Pickford: BABAOFF, MARCUS Treating Jemimah Cressy/Extender: Ricard Dillon Weeks in Treatment: 6 Vital Signs Time Taken: 13:39 Temperature (F): 98.8 Height (in):  64 Pulse (bpm): 80 Weight (lbs): 120 Respiratory Rate (breaths/min): 16 Body Mass Index (BMI): 20.6 Blood Pressure (mmHg): 157/73 Reference Range: 80 - 120 mg / dl Electronic Signature(s) Signed: 02/27/2019 8:05:03 AM By: Harold Barban Entered By: Harold Barban on 02/26/2019 13:40:43

## 2019-02-27 NOTE — Progress Notes (Signed)
[name], [name] ([id number]) Visit Report for 02/26/2019 HPI Details [name], [name]. Date of Service: 02/26/2019 1:45 PM Medical Record Number: [id number] Patient Account Number: [id number] Date of Birth/Sex: 06/04/1926 (85 y.o. F) Treating RN: Cornell Barman Primary Care Provider: Derinda Late Other Clinician: Referring Provider: BABAOFF, MARCUS Treating Provider/Extender: Tito Dine in Treatment: 6 History of Present Illness HPI Description: 06/06/17 on evaluation today patient presents for evaluation concerning an ulcer which she initially had arise as a result of striking her left lower extremity money bed frame. With that being said this was roughly one month ago and unfortunately the wound despite two courses of Keflex have not really made a dramatic improvement. This has continued to drain as well as being exquisitely tender at times. Even to the point that she is having a difficult time walking. Patient does have chronic atrial fibrillation which subsequently leads to her being on long-term anticoagulant therapy and this causes ED bruising which may have contributed to this entry and where things are at this point. She does have valvular heart disease as well as hypertension and her last INR was 2.7. Currently Neosporin, Gauls, and an ace wrap has been utilized. Keflex is the antibiotic that she has been on. Patient has not had a culture up to this point and she states that her blood pressure at the primary care office yesterday was 138/72. Her pain is significant related to be an eight out of 10. No fevers, chills, nausea, or vomiting noted at this time. 06/20/17; patient was admitted here 2 weeks ago. She had a traumatic wound on her left lateral lower leg probably in the setting of some degree of venous insufficiency with surrounding cellulitis. She had been on Keflex, after we saw her she was put on doxycycline which she apparently did not tolerate. Culture  that was done at the time showed Morganella which was resistant to Keflex. Apparently the patient continued to take Keflex after the appointment. She is also on Coumadin. I had tried to change her antibiotic when the Morganella culture was brought to my attention however apparently our staff could not reach the patient to inform them of the antibiotic change 06/26/17; patient has wound on her left lateral lower extremity in the setting of some degree of venous insufficiency. I gave her cefdinir last week and she is completing this. There is no evidence of surrounding infection. Aggressive debridement last week, wound bed looks healthier. We've been using Aquacel. They're traveling in the [place] next week we'll see her back in 2 weeks unless there are problems. 07/10/17; the patient is been using Aquacel Ag her husband is changing this with Kerlix and conform. She still complains of a lot of pain. Apparently with the antibiotics. We gave 2 or 3 weeks ago. Her INR went up to 6, this was managed by her primary physician 07/17/17; she is using Aquacel Ag and a border foam. Still discomfort although dimensions are better. 07/24/17; patient or for review of a trauma wound on her left anterior leg in the setting of some degree of chronic venous insufficiency she has been using Aquacel Ag and a border foam and making progress. 07/31/17; only a small open area of this original significant trauma is still open. Her husband is changing this every second day [Aquacel Ag] Readmission: 01/14/19 on evaluation today patient is that she seen for initial inspection during the office visit today concerning a trauma/skin tear to the left anterior lower extremity. This is roughly  the same region where she was previously treated and years past when she was here in the clinic. Nonetheless upon evaluation today the patient's wound actually showed signs of decent granulation around the edge although unfortunately the skin  flap that was torn back did fold under on itself and the flap itself is dying. She does have some eschar surrounded the edges of the wound at this point. Nonetheless this also was very tender for her. The patient does have a history of hypertension, atrial fibrillation, and long-term use anticoagulant therapy. 01/21/19 on evaluation today patient's wound on the anterior portion of her lower extremity appears to still be necrotic as far as the surface of the wound is concerned. Unfortunately there is no significant improvement at this point compared to last week they were not able to get the Santyl that are using Medihoney and maybe one reason why. The other is I think this may be stained to drive. Possibly adding something such as mepitel over top of the Medihoney may help to loosen this up quite a bit which I think would be in her best interest. Also she still has a lot of erythema and is very tender to touch I'm afraid that we [name], [name]. ([id number]) may need to switch to a different antibiotic to see if this will be of benefit there still really nothing that I can culture to get a better picture of what's going on and what we need to do from the standpoint of antibiotics. I do believe the patient needs a referral Talmuds me to vascular in order to evaluate her blood flow based on what I'm seeing they will be able to perform TBI testing if nothing else along with the vascular evaluation to see if there's any evidence for limited blood flow to this lower extremity. 4/8; I have reviewed the patient's arterial studies and they are really quite good. There should be no arterial impediments to healing the wound on the right anterior tibial area. 4/15; patient has a small open area over the left mid tibial area. Still requiring debridement we are using Santyl here and Medihoney at home [Santyl unaffordable] 4/22; difficult, presumably traumatic area over the left mid tibial area in the setting of  chronic venous insufficiency. We have been using Santyl here and meta honey at home. The wound surface is getting gradually better and slight improvement in dimensions. Her husband is changing this daily 4/29; left mid tibia presumably traumatic wound in the setting of chronic venous insufficiency. We have been using Santyl and a combination of Medihoney at home. Arrives today with a much better looking healthy granulated surface. We will change the primary dressing to [facility] 5/6; left mid tibia smaller reasonably healthy looking wound using Hydrofera Blue since last week Electronic Signature(s) Signed: 02/26/2019 4:51:59 PM By: Linton Ham MD Entered By: Linton Ham on 02/26/2019 14:21:50 [place], [name] ([id number]) -------------------------------------------------------------------------------- Physical Exam Details [name]. Date of Service: 02/26/2019 1:45 PM Medical Record Number: [id number] Patient Account Number: [id number] Date of Birth/Sex: [birth year] (85 y.o. F) Treating RN: Cornell Barman Primary Care Provider: Derinda Late Other Clinician: Referring Provider: BABAOFF, MARCUS Treating Provider/Extender: Ricard Dillon Weeks in Treatment: 6 Constitutional Patient is hypertensive.. Pulse regular and within target range for patient.Marland Kitchen Respirations regular, non-labored and within target range.. Temperature is normal and within the target range for the patient.Marland Kitchen appears in no distress. Eyes Conjunctivae clear. No discharge. Respiratory Respiratory effort is easy and symmetric bilaterally. Rate is  normal at rest and on room air.. Cardiovascular Femoral arteries palpable. Pedal pulses are palpable. Skin changes of chronic venous insufficiency but without edema. Lymphatic None palpable in either popliteal area. Integumentary (Hair, Skin) Skin changes of chronic venous insufficiency but without edema. Psychiatric No evidence of depression,  anxiety, or agitation. Calm, cooperative, and communicative. Appropriate interactions and affect.. Notes Wound exam; wound is on the left mid tibia. Circular wound which is gradually getting smaller. No debridement is required which is fortunate because the patient even tolerates cleaning the wound surface poorly. Electronic Signature(s) Signed: 02/26/2019 4:51:59 PM By: Linton Ham MD Entered By: Linton Ham on 02/26/2019 14:23:50 [name] ([id number]) -------------------------------------------------------------------------------- Physician Orders Details [name] Date of Service: 02/26/2019 1:45 PM Medical Record Number: [id number] Patient Account Number: [id number] Date of Birth/Sex: 1926-05-13 (85 y.o. F) Treating RN: Cornell Barman Primary Care Provider: Derinda Late Other Clinician: Referring Provider: BABAOFF, MARCUS Treating Provider/Extender: Tito Dine in Treatment: 6 Verbal / Phone Orders: No Diagnosis Coding Wound Cleansing Wound #2 Left,Anterior Lower Leg o Clean wound with Normal Saline. o Cleanse wound with mild soap and water o May Shower, gently pat wound dry prior to applying new dressing. Anesthetic (add to Medication List) Wound #2 Left,Anterior Lower Leg o Topical Lidocaine 4% cream applied to wound bed prior to debridement (In Clinic Only). Primary Wound Dressing Wound #2 Left,Anterior Lower Leg o Hydrafera Blue Ready Transfer Secondary Dressing Wound #2 Left,Anterior Lower Leg o ABD and Kerlix/Conform Dressing Change Frequency Wound #2 Left,Anterior Lower Leg o Change dressing every other day. Follow-up Appointments Wound #2 Left,Anterior Lower Leg o Return Appointment in 1 week. Edema Control Wound #2 Left,Anterior Lower Leg o Elevate legs to the level of the heart and pump ankles as often as possible o Other: - Tubigrip F Electronic Signature(s) Signed: 02/26/2019 4:51:59 PM By:  Linton Ham MD Signed: 02/26/2019 5:13:19 PM By: Gretta Cool, BSN, RN, [name], Kim RN, BSN Entered By: Gretta Cool, BSN, RN, [name], Kim on 02/26/2019 13:57:47 [name] ([id number]) -------------------------------------------------------------------------------- Problem List Details [name], [name]. Date of Service: 02/26/2019 1:45 PM Medical Record Number: [id number] Patient Account Number: [id number] Date of Birth/Sex: 1925/12/17 (85 y.o. F) Treating RN: Cornell Barman Primary Care Provider: Derinda Late Other Clinician: Referring Provider: BABAOFF, MARCUS Treating Provider/Extender: Tito Dine in Treatment: 6 Active Problems ICD-10 Evaluated Encounter Code Description Active Date Today Diagnosis S81.802A Unspecified open wound, left lower leg, initial encounter 01/14/2019 No Yes L97.822 Non-pressure chronic ulcer of other part of left lower leg with 01/14/2019 No Yes fat layer exposed I48.0 Paroxysmal atrial fibrillation 01/14/2019 No Yes Z79.01 Long term (current) use of anticoagulants 01/14/2019 No Yes I10 Essential (primary) hypertension 01/14/2019 No Yes Inactive Problems Resolved Problems Electronic Signature(s) Signed: 02/26/2019 4:51:59 PM By: Linton Ham MD Entered By: Linton Ham on 02/26/2019 14:17:36 [name], [name] ([id number]) -------------------------------------------------------------------------------- Progress Note Details [name]. Date of Service: 02/26/2019 1:45 PM Medical Record Number: [id number] Patient Account Number: [id number] Date of Birth/Sex: 06-17-1926 (85 y.o. F) Treating RN: Cornell Barman Primary Care Provider: Derinda Late Other Clinician: Referring Provider: BABAOFF, MARCUS Treating Provider/Extender: Ricard Dillon Weeks in Treatment: 6 Subjective History of Present Illness (HPI) 06/06/17 on evaluation today patient presents for evaluation concerning an ulcer which she initially had arise as a  result of striking her left lower extremity money bed frame. With that being said this was roughly one month ago and unfortunately the wound despite two courses  of Keflex have not really made a dramatic improvement. This has continued to drain as well as being exquisitely tender at times. Even to the point that she is having a difficult time walking. Patient does have chronic atrial fibrillation which subsequently leads to her being on long-term anticoagulant therapy and this causes ED bruising which may have contributed to this entry and where things are at this point. She does have valvular heart disease as well as hypertension and her last INR was 2.7. Currently Neosporin, Gauls, and an ace wrap has been utilized. Keflex is the antibiotic that she has been on. Patient has not had a culture up to this point and she states that her blood pressure at the primary care office yesterday was 138/72. Her pain is significant related to be an eight out of 10. No fevers, chills, nausea, or vomiting noted at this time. 06/20/17; patient was admitted here 2 weeks ago. She had a traumatic wound on her left lateral lower leg probably in the setting of some degree of venous insufficiency with surrounding cellulitis. She had been on Keflex, after we saw her she was put on doxycycline which she apparently did not tolerate. Culture that was done at the time showed Morganella which was resistant to Keflex. Apparently the patient continued to take Keflex after the appointment. She is also on Coumadin. I had tried to change her antibiotic when the Morganella culture was brought to my attention however apparently our staff could not reach the patient to inform them of the antibiotic change 06/26/17; patient has wound on her left lateral lower extremity in the setting of some degree of venous insufficiency. I gave her cefdinir last week and she is completing this. There is no evidence of surrounding infection. Aggressive  debridement last week, wound bed looks healthier. We've been using Aquacel. They're traveling in the [place] next week we'll see her back in 2 weeks unless there are problems. 07/10/17; the patient is been using Aquacel Ag her husband is changing this with Kerlix and conform. She still complains of a lot of pain. Apparently with the antibiotics. We gave 2 or 3 weeks ago. Her INR went up to 6, this was managed by her primary physician 07/17/17; she is using Aquacel Ag and a border foam. Still discomfort although dimensions are better. 07/24/17; patient or for review of a trauma wound on her left anterior leg in the setting of some degree of chronic venous insufficiency she has been using Aquacel Ag and a border foam and making progress. 07/31/17; only a small open area of this original significant trauma is still open. Her husband is changing this every second day [Aquacel Ag] Readmission: 01/14/19 on evaluation today patient is that she seen for initial inspection during the office visit today concerning a trauma/skin tear to the left anterior lower extremity. This is roughly the same region where she was previously treated and years past when she was here in the clinic. Nonetheless upon evaluation today the patient's wound actually showed signs of decent granulation around the edge although unfortunately the skin flap that was torn back did fold under on itself and the flap itself is dying. She does have some eschar surrounded the edges of the wound at this point. Nonetheless this also was very tender for her. The patient does have a history of hypertension, atrial fibrillation, and long-term use anticoagulant therapy. 01/21/19 on evaluation today patient's wound on the anterior portion of her lower extremity appears to still be necrotic as  far as the surface of the wound is concerned. Unfortunately there is no significant improvement at this point compared to last week they were not able to get the  Santyl that are using Medihoney and maybe one reason why. The other is I think this may be stained to drive. Possibly adding something such as mepitel over top of the Medihoney may help to loosen this up quite a bit which I think would be in her best interest. Also she still has a lot of erythema and is very tender to touch I'm afraid that we may need to switch to a different antibiotic to see if this will be of benefit there still really nothing that I can culture to get a better picture of what's going on and what we need to do from the standpoint of antibiotics. I do believe the patient needs a referral Talmuds me to vascular in order to evaluate her blood flow based on what I'm seeing they will be able to perform TBI [name], [name] ([id number]) testing if nothing else along with the vascular evaluation to see if there's any evidence for limited blood flow to this lower extremity. 4/8; I have reviewed the patient's arterial studies and they are really quite good. There should be no arterial impediments to healing the wound on the right anterior tibial area. 4/15; patient has a small open area over the left mid tibial area. Still requiring debridement we are using Santyl here and Medihoney at home [Santyl unaffordable] 4/22; difficult, presumably traumatic area over the left mid tibial area in the setting of chronic venous insufficiency. We have been using Santyl here and meta honey at home. The wound surface is getting gradually better and slight improvement in dimensions. Her husband is changing this daily 4/29; left mid tibia presumably traumatic wound in the setting of chronic venous insufficiency. We have been using Santyl and a combination of Medihoney at home. Arrives today with a much better looking healthy granulated surface. We will change the primary dressing to [facility] 5/6; left mid tibia smaller reasonably healthy looking wound using Hydrofera Blue since last  week Objective Constitutional Patient is hypertensive.. Pulse regular and within target range for patient.Marland Kitchen Respirations regular, non-labored and within target range.. Temperature is normal and within the target range for the patient.Marland Kitchen appears in no distress. Vitals Time Taken: 1:39 PM, Height: 64 in, Weight: 120 lbs, BMI: 20.6, Temperature: 98.8 F, Pulse: 80 bpm, Respiratory Rate: 16 breaths/min, Blood Pressure: 157/73 mmHg. Eyes Conjunctivae clear. No discharge. Respiratory Respiratory effort is easy and symmetric bilaterally. Rate is normal at rest and on room air.. Cardiovascular Femoral arteries palpable. Pedal pulses are palpable. Skin changes of chronic venous insufficiency but without edema. Lymphatic None palpable in either popliteal area. Psychiatric No evidence of depression, anxiety, or agitation. Calm, cooperative, and communicative. Appropriate interactions and affect.. General Notes: Wound exam; wound is on the left mid tibia. Circular wound which is gradually getting smaller. No debridement is required which is fortunate because the patient even tolerates cleaning the wound surface poorly. Integumentary (Hair, Skin) Skin changes of chronic venous insufficiency but without edema. Wound #2 status is Open. Original cause of wound was Trauma. The wound is located on the Left,Anterior Lower Leg. The wound measures 1cm length x 1.4cm width x 0.1cm depth; 1.1cm^2 area and 0.11cm^3 volume. There is Fat Layer (Subcutaneous Tissue) Exposed exposed. There is no tunneling or undermining noted. There is a medium amount of sanguinous drainage noted. The wound margin is  flat and intact. There is large (67-100%) red granulation within the wound bed. There is a small (1-33%) amount of necrotic tissue within the wound bed including Adherent Slough. The periwound skin appearance exhibited: Hemosiderin Staining. The periwound skin appearance did not exhibit: Callus, Crepitus,  Excoriation, [place], [name] ([id number]) Induration, Rash, Scarring, Dry/Scaly, Maceration, Atrophie Blanche, Cyanosis, Ecchymosis, Mottled, Pallor, Rubor, Erythema. Periwound temperature was noted as No Abnormality. The periwound has tenderness on palpation. Assessment Active Problems ICD-10 Unspecified open wound, left lower leg, initial encounter Non-pressure chronic ulcer of other part of left lower leg with fat layer exposed Paroxysmal atrial fibrillation Long term (current) use of anticoagulants Essential (primary) hypertension Diagnoses ICD-10 S81.802A: Unspecified open wound, left lower leg, initial encounter G89.169: Non-pressure chronic ulcer of other part of left lower leg with fat layer exposed I48.0: Paroxysmal atrial fibrillation Z79.01: Long term (current) use of anticoagulants I10: Essential (primary) hypertension Plan Wound Cleansing: Wound #2 Left,Anterior Lower Leg: Clean wound with Normal Saline. Cleanse wound with mild soap and water May Shower, gently pat wound dry prior to applying new dressing. Anesthetic (add to Medication List): Wound #2 Left,Anterior Lower Leg: Topical Lidocaine 4% cream applied to wound bed prior to debridement (In Clinic Only). Primary Wound Dressing: Wound #2 Left,Anterior Lower Leg: Hydrafera Blue Ready Transfer Secondary Dressing: Wound #2 Left,Anterior Lower Leg: ABD and Kerlix/Conform Dressing Change Frequency: Wound #2 Left,Anterior Lower Leg: Change dressing every other day. Follow-up Appointments: Wound #2 Left,Anterior Lower Leg: Return Appointment in 1 week. Edema Control: Wound #2 Left,Anterior Lower Leg: Elevate legs to the level of the heart and pump ankles as often as possible Other: - Tubigrip F [name], [name] ([id number]) 1. Hydrofera Blue to continue/ABDs/Kerlix and conform wound 2. She did not tolerate more aggressive compression, her husband is changing the dressing Electronic Signature(s) Signed:  02/26/2019 2:27:36 PM By: Linton Ham MD Entered By: Linton Ham on 02/26/2019 14:27:35 [name], [name] ([id number]) -------------------------------------------------------------------------------- SuperBill Details [name]. Date of Service: 02/26/2019 Medical Record Number: [id number] Patient Account Number: [id number] Date of Birth/Sex: [birth year] (85 y.o. F) Treating RN: Cornell Barman Primary Care Provider: Derinda Late Other Clinician: Referring Provider: BABAOFF, MARCUS Treating Provider/Extender: Tito Dine in Treatment: 6 Diagnosis Coding ICD-10 Codes Code Description S81.802A Unspecified open wound, left lower leg, initial encounter L97.822 Non-pressure chronic ulcer of other part of left lower leg with fat layer exposed I48.0 Paroxysmal atrial fibrillation Z79.01 Long term (current) use of anticoagulants I10 Essential (primary) hypertension Facility Procedures CPT4 Code: [id number] Description: [phone] - WOUND CARE VISIT-LEV 2 EST PT Modifier: Quantity: 1 Physician Procedures CPT4 Code Description: [id number] - WC PHYS LEVEL 3 - EST PT ICD-10 Diagnosis Description L97.822 Non-pressure chronic ulcer of other part of left lower leg wit Modifier: h fat layer expos Quantity: 1 ed Electronic Signature(s) Signed: 02/26/2019 4:51:59 PM By: Linton Ham MD Entered By: Linton Ham on 02/26/2019 14:28:00

## 2019-03-12 ENCOUNTER — Encounter: Payer: Medicare Other | Admitting: Internal Medicine

## 2019-03-12 ENCOUNTER — Other Ambulatory Visit: Payer: Self-pay

## 2019-03-12 DIAGNOSIS — L97822 Non-pressure chronic ulcer of other part of left lower leg with fat layer exposed: Secondary | ICD-10-CM | POA: Diagnosis not present

## 2019-03-13 NOTE — Progress Notes (Signed)
[name] ([id number]) Visit Report for 03/12/2019 Arrival Information Details [name], [name]. Date of Service: 03/12/2019 2:45 PM Medical Record Number: [id number] Patient Account Number: [id number] Date of Birth/Sex: 1925/12/02 (85 y.o. F) Treating RN: Harold Barban Primary Care Taejah Ohalloran: Derinda Late Other Clinician: Referring Radley Barto: BABAOFF, MARCUS Treating Lynde Ludwig/Extender: Beverly Gust in Treatment: 8 Visit Information History Since Last Visit Added or deleted any medications: No Patient Arrived: Ambulatory Any new allergies or adverse reactions: No Arrival Time: 15:10 Had a fall or experienced change in No Accompanied By: husband activities of daily living that may affect Transfer Assistance: None risk of falls: Patient Identification Verified: Yes Signs or symptoms of abuse/neglect since last visito No Secondary Verification Process Yes Hospitalized since last visit: No Completed: Has Dressing in Place as Prescribed: Yes Patient Has Alerts: Yes Pain Present Now: No Patient Alerts: NEED ABI - TOO PAINFUL Electronic Signature(s) Signed: 03/12/2019 4:44:13 PM By: Harold Barban Entered By: Harold Barban on 03/12/2019 15:11:37 [name] ([id number]) -------------------------------------------------------------------------------- Clinic Level of Care Assessment Details [name] Date of Service: 03/12/2019 2:45 PM Medical Record Number: [id number] Patient Account Number: [id number] Date of Birth/Sex: 10/14/1926 (85 y.o. F) Treating RN: Cornell Barman Primary Care Dawnisha Marquina: Derinda Late Other Clinician: Referring Elmon Shader: BABAOFF, MARCUS Treating Tyriana Helmkamp/Extender: Beverly Gust in Treatment: 8 Clinic Level of Care Assessment Items TOOL 4 Quantity Score []  - Use when only an EandM is performed on FOLLOW-UP visit 0 ASSESSMENTS - Nursing Assessment / Reassessment X - Reassessment of  Co-morbidities (includes updates in patient status) 1 10 X- 1 5 Reassessment of Adherence to Treatment Plan ASSESSMENTS - Wound and Skin Assessment / Reassessment X - Simple Wound Assessment / Reassessment - one wound 1 5 []  - 0 Complex Wound Assessment / Reassessment - multiple wounds []  - 0 Dermatologic / Skin Assessment (not related to wound area) ASSESSMENTS - Focused Assessment []  - Circumferential Edema Measurements - multi extremities 0 []  - 0 Nutritional Assessment / Counseling / Intervention []  - 0 Lower Extremity Assessment (monofilament, tuning fork, pulses) []  - 0 Peripheral Arterial Disease Assessment (using hand held doppler) ASSESSMENTS - Ostomy and/or Continence Assessment and Care []  - Incontinence Assessment and Management 0 []  - 0 Ostomy Care Assessment and Management (repouching, etc.) PROCESS - Coordination of Care X - Simple Patient / Family Education for ongoing care 1 15 []  - 0 Complex (extensive) Patient / Family Education for ongoing care []  - 0 Staff obtains Programmer, systems, Records, Test Results / Process Orders []  - 0 Staff telephones HHA, Nursing Homes / Clarify orders / etc []  - 0 Routine Transfer to another Facility (non-emergent condition) []  - 0 Routine Hospital Admission (non-emergent condition) []  - 0 New Admissions / Biomedical engineer / Ordering NPWT, Apligraf, etc. []  - 0 Emergency Hospital Admission (emergent condition) X- 1 10 Simple Discharge Coordination []  - 0 Complex (extensive) Discharge Coordination PROCESS - Special Needs []  - Pediatric / Minor Patient Management 0 []  - 0 Isolation Patient Management [name]. ([id number]) []  - 0 Hearing / Language / Visual special needs []  - 0 Assessment of Community assistance (transportation, D/C planning, etc.) []  - 0 Additional assistance / Altered mentation []  - 0 Support Surface(s) Assessment (bed, cushion, seat, etc.) INTERVENTIONS - Wound Cleansing / Measurement X -  Simple Wound Cleansing - one wound 1 5 []  - 0 Complex Wound Cleansing - multiple wounds X- 1 5 Wound Imaging (photographs - any number of wounds) []  - 0 Wound  Tracing (instead of photographs) X- 1 5 Simple Wound Measurement - one wound []  - 0 Complex Wound Measurement - multiple wounds INTERVENTIONS - Wound Dressings []  - Small Wound Dressing one or multiple wounds 0 X- 1 15 Medium Wound Dressing one or multiple wounds []  - 0 Large Wound Dressing one or multiple wounds []  - 0 Application of Medications - topical []  - 0 Application of Medications - injection INTERVENTIONS - Miscellaneous []  - External ear exam 0 []  - 0 Specimen Collection (cultures, biopsies, blood, body fluids, etc.) []  - 0 Specimen(s) / Culture(s) sent or taken to Lab for analysis []  - 0 Patient Transfer (multiple staff / Civil Service fast streamer / Similar devices) []  - 0 Simple Staple / Suture removal (25 or less) []  - 0 Complex Staple / Suture removal (26 or more) []  - 0 Hypo / Hyperglycemic Management (close monitor of Blood Glucose) []  - 0 Ankle / Brachial Index (ABI) - do not check if billed separately X- 1 5 Vital Signs Has the patient been seen at the hospital within the last three years: Yes Total Score: 80 Level Of Care: New/Established - Level 3 Electronic Signature(s) Signed: 03/12/2019 5:05:53 PM By: [name] Entered By: Brittany Cool, BSN, RN, [name], Kim on 03/12/2019 15:32:11 [name] ([id number]) -------------------------------------------------------------------------------- Encounter Discharge Information Details [name]. Date of Service: 03/12/2019 2:45 PM Medical Record Number: [id number] Patient Account Number: [id number] Date of Birth/Sex: [birth year] (85 y.o. F) Treating RN: Cornell Barman Primary Care Merna Baldi: Derinda Late Other Clinician: Referring Gurjit Loconte: Derinda Late Treating Lissandro Dilorenzo/Extender: Beverly Gust in Treatment:  8 Encounter Discharge Information Items Discharge Condition: Stable Ambulatory Status: Ambulatory Discharge Destination: Home Transportation: Private Auto Accompanied By: self Schedule Follow-up Appointment: Yes Clinical Summary of Care: Electronic Signature(s) Signed: 03/12/2019 5:05:53 PM By: [name] Entered By: Brittany Cool, BSN, RN, [name], Kim on 03/12/2019 15:33:02 [name] ([id number]) -------------------------------------------------------------------------------- Lower Extremity Assessment Details Patient Name: ANELLE, PARLOW. Date of Service: 03/12/2019 2:45 PM Medical Record Number: [id number] Patient Account Number: [id number] Date of Birth/Sex: 11-01-1925 (85 y.o. F) Treating RN: Harold Barban Primary Care Alyxis Grippi: Derinda Late Other Clinician: Referring Hollynn Garno: BABAOFF, MARCUS Treating Anjelika Ausburn/Extender: Beverly Gust in Treatment: 8 Vascular Assessment Pulses: Dorsalis Pedis Palpable: [Left:Yes] Posterior Tibial Palpable: [Left:Yes] Electronic Signature(s) Signed: 03/12/2019 4:44:13 PM By: Harold Barban Entered By: Harold Barban on 03/12/2019 15:17:25 Franqui, [name] ([id number]) -------------------------------------------------------------------------------- Multi Wound Chart Details [name]. Date of Service: 03/12/2019 2:45 PM Medical Record Number: [id number] Patient Account Number: [id number] Date of Birth/Sex: 1926-08-24 (85 y.o. F) Treating RN: Cornell Barman Primary Care Knolan Simien: Derinda Late Other Clinician: Referring Jasa Dundon: BABAOFF, MARCUS Treating Nobel Brar/Extender: Beverly Gust in Treatment: 8 Vital Signs Height(in): 64 Pulse(bpm): 81 Weight(lbs): 120 Blood Pressure(mmHg): 179/87 Body Mass Index(BMI): 21 Temperature(F): 98.1 Respiratory Rate 16 (breaths/min): Photos: [N/A:N/A] Wound Location: Left Lower Leg - Anterior N/A N/A Wounding Event: Trauma N/A  N/A Primary Etiology: Trauma, Other N/A N/A Comorbid History: Cataracts, Hypertension, N/A N/A Osteoarthritis, Dementia Date Acquired: 01/07/2019 N/A N/A Weeks of Treatment: 8 N/A N/A Wound Status: Open N/A N/A Measurements L x W x D 0.2x0.2x0.1 N/A N/A (cm) Area (cm) : 0.031 N/A N/A Volume (cm) : 0.003 N/A N/A % Reduction in Area: 99.20% N/A N/A % Reduction in Volume: 99.20% N/A N/A Classification: Partial Thickness N/A N/A Exudate Amount: None Present N/A N/A Wound Margin: Flat and Intact N/A N/A Granulation Amount: Large (67-100%) N/A  N/A Granulation Quality: Red N/A N/A Necrotic Amount: Small (1-33%) N/A N/A Exposed Structures: Fat Layer (Subcutaneous N/A N/A Tissue) Exposed: Yes Fascia: No Tendon: No Muscle: No Joint: No Bone: No Epithelialization: Small (1-33%) N/A N/A Treatment Notes Electronic Signature(s) Signed: 03/12/2019 5:05:53 PM By: [name] Entered By: Brittany Cool, BSN, RN, [name], Kim on 03/12/2019 15:24:13 [name], [name] ([id number]) [name], [name] ([id number]) -------------------------------------------------------------------------------- Multi-Disciplinary Care Plan Details [name], [name]. Date of Service: 03/12/2019 2:45 PM Medical Record Number: [id number] Patient Account Number: [id number] Date of Birth/Sex: 1925/10/27 (85 y.o. F) Treating RN: Cornell Barman Primary Care Jameson Morrow: Derinda Late Other Clinician: Referring Janasha Barkalow: Derinda Late Treating Jojuan Champney/Extender: Beverly Gust in Treatment: 8 Active Inactive Abuse / Safety / Falls / Self Care Management Nursing Diagnoses: Potential for falls Goals: Patient will remain injury free related to falls Date Initiated: 01/14/2019 Target Resolution Date: 03/29/2019 Goal Status: Active Interventions: Assess fall risk on admission and as needed Notes: Necrotic Tissue Nursing Diagnoses: Impaired tissue integrity related to necrotic/devitalized  tissue Goals: Patient/caregiver will verbalize understanding of reason and process for debridement of necrotic tissue Date Initiated: 01/21/2019 Target Resolution Date: 03/29/2019 Goal Status: Active Interventions: Provide education on necrotic tissue and debridement process Notes: Orientation to the Wound Care Program Nursing Diagnoses: Knowledge deficit related to the wound healing center program Goals: Patient/caregiver will verbalize understanding of the [place] Program Date Initiated: 01/14/2019 Target Resolution Date: 03/29/2019 Goal Status: Active Interventions: Provide education on orientation to the wound center Notes: Pain, Acute or Chronic Nursing Diagnoses: Pain, acute or chronic: actual or potential [name], [name] ([id number]) Goals: Patient will verbalize adequate pain control and receive pain control interventions during procedures as needed Date Initiated: 01/21/2019 Target Resolution Date: 03/29/2019 Goal Status: Active Interventions: Complete pain assessment as per visit requirements Notes: Wound/Skin Impairment Nursing Diagnoses: Impaired tissue integrity Goals: Ulcer/skin breakdown will heal within 14 weeks Date Initiated: 01/14/2019 Target Resolution Date: 03/29/2019 Goal Status: Active Interventions: Assess patient/caregiver ability to obtain necessary supplies Assess patient/caregiver ability to perform ulcer/skin care regimen upon admission and as needed Assess ulceration(s) every visit Notes: Electronic Signature(s) Signed: 03/12/2019 5:05:53 PM By: [name] Entered By: Brittany Cool, BSN, RN, [name], Kim on 03/12/2019 15:24:02 [name] ([id number]) -------------------------------------------------------------------------------- Pain Assessment Details [name]. Date of Service: 03/12/2019 2:45 PM Medical Record Number: [id number] Patient Account Number: [id number] Date of Birth/Sex: 1926-07-16  (85 y.o. F) Treating RN: Harold Barban Primary Care Christean Silvestri: Derinda Late Other Clinician: Referring Nohealani Medinger: Derinda Late Treating Anyela Napierkowski/Extender: Beverly Gust in Treatment: 8 Active Problems Location of Pain Severity and Description of Pain Patient Has Paino No Site Locations Pain Management and Medication Current Pain Management: Electronic Signature(s) Signed: 03/12/2019 4:44:13 PM By: Harold Barban Entered By: Harold Barban on 03/12/2019 15:11:43 [name], [name] ([id number]) -------------------------------------------------------------------------------- Patient/Caregiver Education Details [name]. Date of Service: 03/12/2019 2:45 PM Medical Record Number: [id number] Patient Account Number: [id number] Date of Birth/Gender: 11-Mar-1926 (85 y.o. F) Treating RN: Cornell Barman Primary Care Physician: Derinda Late Other Clinician: Referring Physician: Derinda Late Treating Physician/Extender: Beverly Gust in Treatment: 8 Education Assessment Education Provided To: Patient Education Topics Provided Wound/Skin Impairment: Handouts: Caring for Your Ulcer Methods: Demonstration, Explain/Verbal Responses: State content correctly Electronic Signature(s) Signed: 03/12/2019 5:05:53 PM By: [name] Entered By: Brittany Cool, BSN, RN, [name], Kim on 03/12/2019 15:32:26 [name] ([id number]) -------------------------------------------------------------------------------- Wound Assessment Details  [name], [name]. Date of Service: 03/12/2019 2:45 PM Medical Record Number: [id number] Patient Account Number: [id number] Date of Birth/Sex: 11-11-1925 (85 y.o. F) Treating RN: Harold Barban Primary Care Dyllan Hughett: Derinda Late Other Clinician: Referring Akeelah Seppala: BABAOFF, MARCUS Treating Kelsye Loomer/Extender: Beverly Gust in Treatment: 8 Wound Status Wound Number: 2 Primary Trauma,  Other Etiology: Wound Location: Left Lower Leg - Anterior Wound Status: Open Wounding Event: Trauma Comorbid Cataracts, Hypertension, Osteoarthritis, Date Acquired: 01/07/2019 History: Dementia Weeks Of Treatment: 8 Clustered Wound: No Photos Wound Measurements Length: (cm) 0.2 Width: (cm) 0.2 Depth: (cm) 0.1 Area: (cm) 0.031 Volume: (cm) 0.003 % Reduction in Area: 99.2% % Reduction in Volume: 99.2% Epithelialization: Small (1-33%) Tunneling: No Undermining: No Wound Description Classification: Partial Thickness Wound Margin: Flat and Intact Exudate Amount: None Present Foul Odor After Cleansing: No Slough/Fibrino Yes Wound Bed Granulation Amount: Large (67-100%) Exposed Structure Granulation Quality: Red Fascia Exposed: No Necrotic Amount: Small (1-33%) Fat Layer (Subcutaneous Tissue) Exposed: Yes Necrotic Quality: Adherent Slough Tendon Exposed: No Muscle Exposed: No Joint Exposed: No Bone Exposed: No Treatment Notes Wound #2 (Left, Anterior Lower Leg) Notes Hydrfera blue ABD, conform Electronic Signature(s) Signed: 03/12/2019 4:44:13 PM By: Harold Barban Entered By: Harold Barban on 03/12/2019 15:16:30 Zaragoza, [name] ([id number]) -------------------------------------------------------------------------------- Vitals Details [name]. Date of Service: 03/12/2019 2:45 PM Medical Record Number: [id number] Patient Account Number: [id number] Date of Birth/Sex: 02/03/1926 (85 y.o. F) Treating RN: Harold Barban Primary Care Brealynn Contino: Derinda Late Other Clinician: Referring Estelene Carmack: BABAOFF, MARCUS Treating Terresa Marlett/Extender: Beverly Gust in Treatment: 8 Vital Signs Time Taken: 15:10 Temperature (F): 98.1 Height (in): 64 Pulse (bpm): 81 Weight (lbs): 120 Respiratory Rate (breaths/min): 16 Body Mass Index (BMI): 20.6 Blood Pressure (mmHg): 179/87 Reference Range: 80 - 120 mg / dl Electronic  Signature(s) Signed: 03/12/2019 4:44:13 PM By: Harold Barban Entered By: Harold Barban on 03/12/2019 15:12:47

## 2019-03-13 NOTE — Progress Notes (Signed)
LEKIA, NIER ([id number]) Visit Report for 03/12/2019 HPI Details [name], [name]. Date of Service: 03/12/2019 2:45 PM Medical Record Number: [id number] Patient Account Number: [id number] Date of Birth/Sex: 1926-10-21 (85 y.o. F) Treating RN: Cornell Barman Primary Care Provider: Derinda Late Other Clinician: Referring Provider: Derinda Late Treating Provider/Extender: Beverly Gust in Treatment: 8 History of Present Illness HPI Description: 06/06/17 on evaluation today patient presents for evaluation concerning an ulcer which she initially had arise as a result of striking her left lower extremity money bed frame. With that being said this was roughly one month ago and unfortunately the wound despite two courses of Keflex have not really made a dramatic improvement. This has continued to drain as well as being exquisitely tender at times. Even to the point that she is having a difficult time walking. Patient does have chronic atrial fibrillation which subsequently leads to her being on long-term anticoagulant therapy and this causes ED bruising which may have contributed to this entry and where things are at this point. She does have valvular heart disease as well as hypertension and her last INR was 2.7. Currently Neosporin, Gauls, and an ace wrap has been utilized. Keflex is the antibiotic that she has been on. Patient has not had a culture up to this point and she states that her blood pressure at the primary care office yesterday was 138/72. Her pain is significant related to be an eight out of 10. No fevers, chills, nausea, or vomiting noted at this time. 06/20/17; patient was admitted here 2 weeks ago. She had a traumatic wound on her left lateral lower leg probably in the setting of some degree of venous insufficiency with surrounding cellulitis. She had been on Keflex, after we saw her she was put on doxycycline which she apparently did not tolerate. Culture  that was done at the time showed Morganella which was resistant to Keflex. Apparently the patient continued to take Keflex after the appointment. She is also on Coumadin. I had tried to change her antibiotic when the Morganella culture was brought to my attention however apparently our staff could not reach the patient to inform them of the antibiotic change 06/26/17; patient has wound on her left lateral lower extremity in the setting of some degree of venous insufficiency. I gave her cefdinir last week and she is completing this. There is no evidence of surrounding infection. Aggressive debridement last week, wound bed looks healthier. We've been using Aquacel. They're traveling in the [place] next week we'll see her back in 2 weeks unless there are problems. 07/10/17; the patient is been using Aquacel Ag her husband is changing this with Kerlix and conform. She still complains of a lot of pain. Apparently with the antibiotics. We gave 2 or 3 weeks ago. Her INR went up to 6, this was managed by her primary physician 07/17/17; she is using Aquacel Ag and a border foam. Still discomfort although dimensions are better. 07/24/17; patient or for review of a trauma wound on her left anterior leg in the setting of some degree of chronic venous insufficiency she has been using Aquacel Ag and a border foam and making progress. 07/31/17; only a small open area of this original significant trauma is still open. Her husband is changing this every second day [Aquacel Ag] Readmission: 01/14/19 on evaluation today patient is that she seen for initial inspection during the office visit today concerning a trauma/skin tear to the left anterior lower extremity. This is roughly the  same region where she was previously treated and years past when she was here in the clinic. Nonetheless upon evaluation today the patient's wound actually showed signs of decent granulation around the edge although unfortunately the skin  flap that was torn back did fold under on itself and the flap itself is dying. She does have some eschar surrounded the edges of the wound at this point. Nonetheless this also was very tender for her. The patient does have a history of hypertension, atrial fibrillation, and long-term use anticoagulant therapy. 01/21/19 on evaluation today patient's wound on the anterior portion of her lower extremity appears to still be necrotic as far as the surface of the wound is concerned. Unfortunately there is no significant improvement at this point compared to last week they were not able to get the Santyl that are using Medihoney and maybe one reason why. The other is I think this may be stained to drive. Possibly adding something such as mepitel over top of the Medihoney may help to loosen this up quite a bit which I think would be in her best interest. Also she still has a lot of erythema and is very tender to touch I'm afraid that we may need to switch to a different antibiotic to see if this will be of benefit there still really nothing that I can culture to get a better picture of what's going on and what we need to do from the standpoint of antibiotics. I do believe the patient needs a referral Talmuds me to vascular in order to evaluate her blood flow based on what I'm seeing they will be able to perform TBI testing if nothing else along with the vascular evaluation to see if there's any evidence for limited blood flow to this lower extremity. 4/8; I have reviewed the patient's arterial studies and they are really quite good. There should be no arterial impediments to healing the wound on the right anterior tibial area. 4/15; patient has a small open area over the left mid tibial area. Still requiring debridement we are using Santyl here and Constable, Phung C. ([id number]) Medihoney at home [Santyl unaffordable] 4/22; difficult, presumably traumatic area over the left mid tibial area in the setting of  chronic venous insufficiency. We have been using Santyl here and meta honey at home. The wound surface is getting gradually better and slight improvement in dimensions. Her husband is changing this daily 4/29; left mid tibia presumably traumatic wound in the setting of chronic venous insufficiency. We have been using Santyl and a combination of Medihoney at home. Arrives today with a much better looking healthy granulated surface. We will change the primary dressing to [facility] 5/6; left mid tibia smaller reasonably healthy looking wound using Hydrofera Blue since last week 5/20-Patient returns to clinic with a left mid tibial wound looking better, we have been using Hydrofera Blue since the previous week Electronic Signature(s) Signed: 03/12/2019 3:29:27 PM By: Tobi Bastos Entered By: Tobi Bastos on 03/12/2019 15:29:27 Bartmess, [name] ([id number]) -------------------------------------------------------------------------------- Physical Exam Details Patient Name: [name]. Date of Service: 03/12/2019 2:45 PM Medical Record Number: [id number] Patient Account Number: [id number] Date of Birth/Sex: 10-Oct-1926 (85 y.o. F) Treating RN: Cornell Barman Primary Care Provider: Derinda Late Other Clinician: Referring Provider: BABAOFF, MARCUS Treating Provider/Extender: Beverly Gust in Treatment: 8 Constitutional alert and oriented x 3. sitting or standing blood pressure is within target range for patient.. supine blood pressure is within target range for patient.. pulse regular and within  target range for patient.Marland Kitchen respirations regular, non-labored and within target range for patient.Marland Kitchen temperature within target range for patient.. . . Well-nourished and well-hydrated in no acute distress. Notes The left mid tibial wound appears smaller, the surface appears healthy with no slough I described the rim with gauze and saline Electronic Signature(s) Signed: 03/12/2019  3:30:10 PM By: Tobi Bastos Entered By: Tobi Bastos on 03/12/2019 15:30:10 [name] ([id number]) -------------------------------------------------------------------------------- Physician Orders Details Patient Name: [name]. Date of Service: 03/12/2019 2:45 PM Medical Record Number: [id number] Patient Account Number: [id number] Date of Birth/Sex: 1926-09-17 (85 y.o. F) Treating RN: Cornell Barman Primary Care Provider: Derinda Late Other Clinician: Referring Provider: Derinda Late Treating Provider/Extender: Beverly Gust in Treatment: 8 Verbal / Phone Orders: No Diagnosis Coding Wound Cleansing Wound #2 Left,Anterior Lower Leg o Clean wound with Normal Saline. o Cleanse wound with mild soap and water o May Shower, gently pat wound dry prior to applying new dressing. Anesthetic (add to Medication List) Wound #2 Left,Anterior Lower Leg o Topical Lidocaine 4% cream applied to wound bed prior to debridement (In Clinic Only). Primary Wound Dressing Wound #2 Left,Anterior Lower Leg o Hydrafera Blue Ready Transfer Secondary Dressing Wound #2 Left,Anterior Lower Leg o ABD and Kerlix/Conform Dressing Change Frequency Wound #2 Left,Anterior Lower Leg o Change dressing every other day. Follow-up Appointments Wound #2 Left,Anterior Lower Leg o Return Appointment in 1 week. Edema Control Wound #2 Left,Anterior Lower Leg o Elevate legs to the level of the heart and pump ankles as often as possible Electronic Signature(s) Signed: 03/12/2019 4:16:13 PM By: Tobi Bastos Signed: 03/12/2019 5:05:53 PM By: Gretta Cool, BSN, RN, [name], Kim RN, BSN Entered By: Gretta Cool, BSN, RN, [name], Kim on 03/12/2019 15:24:49 [name] ([id number]) -------------------------------------------------------------------------------- Progress Note Details Patient Name: EVALETTE, MONTROSE. Date of Service: 03/12/2019 2:45 PM Medical Record Number:  [id number] Patient Account Number: [id number] Date of Birth/Sex: 1926-03-03 (85 y.o. F) Treating RN: Cornell Barman Primary Care Provider: Derinda Late Other Clinician: Referring Provider: Derinda Late Treating Provider/Extender: Beverly Gust in Treatment: 8 Subjective History of Present Illness (HPI) 06/06/17 on evaluation today patient presents for evaluation concerning an ulcer which she initially had arise as a result of striking her left lower extremity money bed frame. With that being said this was roughly one month ago and unfortunately the wound despite two courses of Keflex have not really made a dramatic improvement. This has continued to drain as well as being exquisitely tender at times. Even to the point that she is having a difficult time walking. Patient does have chronic atrial fibrillation which subsequently leads to her being on long-term anticoagulant therapy and this causes ED bruising which may have contributed to this entry and where things are at this point. She does have valvular heart disease as well as hypertension and her last INR was 2.7. Currently Neosporin, Gauls, and an ace wrap has been utilized. Keflex is the antibiotic that she has been on. Patient has not had a culture up to this point and she states that her blood pressure at the primary care office yesterday was 138/72. Her pain is significant related to be an eight out of 10. No fevers, chills, nausea, or vomiting noted at this time. 06/20/17; patient was admitted here 2 weeks ago. She had a traumatic wound on her left lateral lower leg probably in the setting of some degree of venous insufficiency with surrounding cellulitis. She had been on Keflex, after we saw her she was put on  doxycycline which she apparently did not tolerate. Culture that was done at the time showed Morganella which was resistant to Keflex. Apparently the patient continued to take Keflex after the appointment. She is also on  Coumadin. I had tried to change her antibiotic when the Morganella culture was brought to my attention however apparently our staff could not reach the patient to inform them of the antibiotic change 06/26/17; patient has wound on her left lateral lower extremity in the setting of some degree of venous insufficiency. I gave her cefdinir last week and she is completing this. There is no evidence of surrounding infection. Aggressive debridement last week, wound bed looks healthier. We've been using Aquacel. They're traveling in the [place] next week we'll see her back in 2 weeks unless there are problems. 07/10/17; the patient is been using Aquacel Ag her husband is changing this with Kerlix and conform. She still complains of a lot of pain. Apparently with the antibiotics. We gave 2 or 3 weeks ago. Her INR went up to 6, this was managed by her primary physician 07/17/17; she is using Aquacel Ag and a border foam. Still discomfort although dimensions are better. 07/24/17; patient or for review of a trauma wound on her left anterior leg in the setting of some degree of chronic venous insufficiency she has been using Aquacel Ag and a border foam and making progress. 07/31/17; only a small open area of this original significant trauma is still open. Her husband is changing this every second day [Aquacel Ag] Readmission: 01/14/19 on evaluation today patient is that she seen for initial inspection during the office visit today concerning a trauma/skin tear to the left anterior lower extremity. This is roughly the same region where she was previously treated and years past when she was here in the clinic. Nonetheless upon evaluation today the patient's wound actually showed signs of decent granulation around the edge although unfortunately the skin flap that was torn back did fold under on itself and the flap itself is dying. She does have some eschar surrounded the edges of the wound at this point.  Nonetheless this also was very tender for her. The patient does have a history of hypertension, atrial fibrillation, and long-term use anticoagulant therapy. 01/21/19 on evaluation today patient's wound on the anterior portion of her lower extremity appears to still be necrotic as far as the surface of the wound is concerned. Unfortunately there is no significant improvement at this point compared to last week they were not able to get the Santyl that are using Medihoney and maybe one reason why. The other is I think this may be stained to drive. Possibly adding something such as mepitel over top of the Medihoney may help to loosen this up quite a bit which I think would be in her best interest. Also she still has a lot of erythema and is very tender to touch I'm afraid that we may need to switch to a different antibiotic to see if this will be of benefit there still really nothing that I can culture to get a better picture of what's going on and what we need to do from the standpoint of antibiotics. I do believe the patient needs a referral Talmuds me to vascular in order to evaluate her blood flow based on what I'm seeing they will be able to perform TBI testing if nothing else along with the vascular evaluation to see if there's any evidence for limited blood flow to this lower extremity.  4/8; I have reviewed the patient's arterial studies and they are really quite good. There should be no arterial impediments to healing the wound on the right anterior tibial area. 4/15; patient has a small open area over the left mid tibial area. Still requiring debridement we are using Santyl here and Medihoney at home [Santyl unaffordable] 4/22; difficult, presumably traumatic area over the left mid tibial area in the setting of chronic venous insufficiency. We have been using Santyl here and meta honey at home. The wound surface is getting gradually better and slight improvement in dimensions. Her husband is  changing this daily AVILENE, MARRIN ([id number]) 4/29; left mid tibia presumably traumatic wound in the setting of chronic venous insufficiency. We have been using Santyl and a combination of Medihoney at home. Arrives today with a much better looking healthy granulated surface. We will change the primary dressing to [facility] 5/6; left mid tibia smaller reasonably healthy looking wound using Hydrofera Blue since last week 5/20-Patient returns to clinic with a left mid tibial wound looking better, we have been using Hydrofera Blue since the previous week Objective Constitutional alert and oriented x 3. sitting or standing blood pressure is within target range for patient.. supine blood pressure is within target range for patient.. pulse regular and within target range for patient.Marland Kitchen respirations regular, non-labored and within target range for patient.Marland Kitchen temperature within target range for patient.. Well-nourished and well-hydrated in no acute distress. Vitals Time Taken: 3:10 PM, Height: 64 in, Weight: 120 lbs, BMI: 20.6, Temperature: 98.1 F, Pulse: 81 bpm, Respiratory Rate: 16 breaths/min, Blood Pressure: 179/87 mmHg. General Notes: The left mid tibial wound appears smaller, the surface appears healthy with no slough I described the rim with gauze and saline Integumentary (Hair, Skin) Wound #2 status is Open. Original cause of wound was Trauma. The wound is located on the Left,Anterior Lower Leg. The wound measures 0.2cm length x 0.2cm width x 0.1cm depth; 0.031cm^2 area and 0.003cm^3 volume. There is Fat Layer (Subcutaneous Tissue) Exposed exposed. There is no tunneling or undermining noted. There is a none present amount of drainage noted. The wound margin is flat and intact. There is large (67-100%) red granulation within the wound bed. There is a small (1-33%) amount of necrotic tissue within the wound bed including Adherent Slough. Plan Wound Cleansing: Wound #2 Left,Anterior  Lower Leg: Clean wound with Normal Saline. Cleanse wound with mild soap and water May Shower, gently pat wound dry prior to applying new dressing. Anesthetic (add to Medication List): Wound #2 Left,Anterior Lower Leg: Topical Lidocaine 4% cream applied to wound bed prior to debridement (In Clinic Only). Primary Wound Dressing: Wound #2 Left,Anterior Lower Leg: Hydrafera Blue Ready Transfer Secondary Dressing: Wound #2 Left,Anterior Lower Leg: ABD and Kerlix/Conform Dressing Change Frequency: Wound #2 Left,Anterior Lower Leg: Change dressing every other day. Follow-up Appointments: Wound #2 Left,Anterior Lower Leg: Return Appointment in 1 week. Edema Control: Wound #2 Left,Anterior Lower Leg: Elevate legs to the level of the heart and pump ankles as often as possible Pablo, Anairis C. ([id number]) 1. Continue Hydrofera Blue to the left tibial wound with dressing changes every other day 2. Patient to return prior to her trip to the [place] may be in 10 days Electronic Signature(s) Signed: 03/12/2019 3:30:45 PM By: Tobi Bastos Entered By: Tobi Bastos on 03/12/2019 15:30:44 Isabell, [name] ([id number]) -------------------------------------------------------------------------------- SuperBill Details Patient Name: [name]. Date of Service: 03/12/2019 Medical Record Number: [id number] Patient Account Number: [id number] Date of Birth/Sex: 1926-06-18 (85  y.o. F) Treating RN: Cornell Barman Primary Care Provider: Derinda Late Other Clinician: Referring Provider: Derinda Late Treating Provider/Extender: Beverly Gust in Treatment: 8 Diagnosis Coding ICD-10 Codes Code Description S81.802A Unspecified open wound, left lower leg, initial encounter L97.822 Non-pressure chronic ulcer of other part of left lower leg with fat layer exposed I48.0 Paroxysmal atrial fibrillation Z79.01 Long term (current) use of anticoagulants I10 Essential (primary)  hypertension Facility Procedures CPT4 Code: [id number] Description: 99213 - WOUND CARE VISIT-LEV 3 EST PT Modifier: Quantity: 1 Physician Procedures CPT4 Code: [id number] Description: 35456 - WC PHYS LEVEL 3 - EST PT ICD-10 Diagnosis Description S81.802A Unspecified open wound, left lower leg, initial encounter Modifier: Quantity: 1 Electronic Signature(s) Signed: 03/12/2019 5:15:30 PM By: Gretta Cool, BSN, RN, [name], Kim RN, BSN Previous Signature: 03/12/2019 3:31:03 PM Version By: Tobi Bastos Entered By: Gretta Cool, BSN, RN, [name], Kim on 03/12/2019 17:15:30

## 2019-03-19 ENCOUNTER — Encounter: Payer: Medicare Other | Admitting: Internal Medicine

## 2019-03-19 ENCOUNTER — Other Ambulatory Visit: Payer: Self-pay

## 2019-03-19 DIAGNOSIS — L97822 Non-pressure chronic ulcer of other part of left lower leg with fat layer exposed: Secondary | ICD-10-CM | POA: Diagnosis not present

## 2019-03-26 NOTE — Progress Notes (Signed)
[name], [name] ([id number]) Visit Report for 03/19/2019 Arrival Information Details Patient Name: MILIYAH, [name]. Date of Service: 03/19/2019 2:00 PM Medical Record Number: [id number] Patient Account Number: [id number] Date of Birth/Sex: 15-Feb-1926 (85 y.o. F) Treating RN: [name] Primary Care [name]: [name], Brittany Other Clinician: Referring [name]: [name], Brittany Treating [name]/Extender: [name] in Treatment: 9 Visit Information History Since Last Visit Added or deleted any medications: No Patient Arrived: Ambulatory Any new allergies or adverse reactions: No Arrival Time: 13:57 Had a fall or experienced change in No Accompanied By: husband activities of daily living that may affect Transfer Assistance: None risk of falls: Patient Identification Verified: Yes Signs or symptoms of abuse/neglect since last visito No Secondary Verification Process Yes Hospitalized since last visit: No Completed: Has Dressing in Place as Prescribed: Yes Patient Has Alerts: Yes Pain Present Now: No Patient Alerts: NEED ABI - TOO PAINFUL Electronic Signature(s) Signed: 03/19/2019 4:36:37 PM By: [name] Entered By: [name] on 03/19/2019 13:57:50 [name], [name] ([id number]) -------------------------------------------------------------------------------- Encounter Discharge Information Details [name]. Date of Service: 03/19/2019 2:00 PM Medical Record Number: [id number] Patient Account Number: [id number] Date of Birth/Sex: [birth year] (85 y.o. F) Treating RN: [name] Primary Care Anny Sayler: [name] Other Clinician: Referring Kie Calvin: [name], Brittany Treating Laryah Neuser/Extender: [name] in Treatment: 9 Encounter Discharge Information Items Post Procedure Vitals Discharge Condition: Stable Temperature (F): 97.7 Ambulatory Status: Ambulatory Pulse (bpm): 84 Discharge Destination:  Home Respiratory Rate (breaths/min): 18 Transportation: Private Auto Blood Pressure (mmHg): 164/92 Accompanied By: husband Schedule Follow-up Appointment: Yes Clinical Summary of Care: Notes PCP changed patients BP medications this morning. Electronic Signature(s) Signed: 03/25/2019 5:54:26 PM By: [name], BSN, RN, [name], Kim RN, BSN Entered By: [name] on 03/19/2019 14:19:08 [name] ([id number]) -------------------------------------------------------------------------------- Lower Extremity Assessment Details [name], [name]. Date of Service: 03/19/2019 2:00 PM Medical Record Number: [id number] Patient Account Number: [id number] Date of Birth/Sex: 1926-01-28 (85 y.o. F) Treating RN: [name] Primary Care Basia Mcginty: [name] Other Clinician: Referring Treyvonne Tata: [name], Brittany Treating Jaxton Casale/Extender: [name] in Treatment: 9 Vascular Assessment Pulses: Dorsalis Pedis Palpable: [Left:Yes] Electronic Signature(s) Signed: 03/19/2019 4:36:37 PM By: [name] Entered By: [name] on 03/19/2019 14:02:30 [name], [name] ([id number]) -------------------------------------------------------------------------------- Multi Wound Chart Details [name]. Date of Service: 03/19/2019 2:00 PM Medical Record Number: [id number] Patient Account Number: [id number] Date of Birth/Sex: 06-May-1926 (85 y.o. F) Treating RN: [name] Primary Care Andra Heslin: [name], Brittany Other Clinician: Referring Wardell Pokorski: [name], Brittany Treating [name]/Extender: [name] in Treatment: 9 Vital Signs Height(in): 64 Pulse(bpm): 84 Weight(lbs): 120 Blood Pressure(mmHg): 157/108 Body Mass Index(BMI): 21 Temperature(F): 97.7 Respiratory Rate 16 (breaths/min): Photos: [N/A:N/A] Wound Location: Left Lower Leg - Anterior N/A N/A Wounding Event: Trauma N/A N/A Primary Etiology: Trauma, Other  N/A N/A Comorbid History: Cataracts, Hypertension, N/A N/A Osteoarthritis, Dementia Date Acquired: 01/07/2019 N/A N/A [name] of Treatment: 9 N/A N/A Wound Status: Open N/A N/A Measurements L x W x D 0.2x0.2x0.1 N/A N/A (cm) Area (cm) : 0.031 N/A N/A Volume (cm) : 0.003 N/A N/A % Reduction in Area: 99.20% N/A N/A % Reduction in Volume: 99.20% N/A N/A Classification: Partial Thickness N/A N/A Exudate Amount: None Present N/A N/A Wound Margin: Flat and Intact N/A N/A Granulation Amount: None Present (0%) N/A N/A Necrotic Amount: Large (67-100%) N/A N/A Necrotic Tissue: Eschar N/A N/A Exposed Structures: Fat Layer (Subcutaneous N/A N/A Tissue) Exposed: Yes Fascia: No  Tendon: No Muscle: No Joint: No Bone: No Epithelialization: Medium (34-66%) N/A N/A Debridement: Debridement - Excisional N/A N/A [name], [name] ([id number]) Pre-procedure 14:11 N/A N/A Verification/Time Out Taken: Pain Control: Lidocaine N/A N/A Tissue Debrided: Subcutaneous N/A N/A Level: Skin/Subcutaneous Tissue N/A N/A Debridement Area (sq cm): 0.04 N/A N/A Instrument: Curette N/A N/A Bleeding: None N/A N/A Debridement Treatment Procedure was tolerated well N/A N/A Response: Post Debridement 0.2x0.2x0.1 N/A N/A Measurements L x W x D (cm) Post Debridement Volume: 0.003 N/A N/A (cm) Procedures Performed: Debridement N/A N/A Treatment Notes Wound #2 (Left, Anterior Lower Leg) Notes Hydrfera blue and coverlet Electronic Signature(s) Signed: 03/19/2019 5:01:45 PM By: Linton Ham MD Entered By: Linton Ham on 03/19/2019 14:45:11 [name], [name] ([id number]) -------------------------------------------------------------------------------- Multi-Disciplinary Care Plan Details [name]. Date of Service: 03/19/2019 2:00 PM Medical Record Number: [id number] Patient Account Number: [id number] Date of Birth/Sex: October 26, 1925 (85 y.o. F) Treating RN: [name] Primary Care  Somer Trotter: [name] Other Clinician: Referring [name]: [name] Treating Fantasha Daniele/Extender: [name] in Treatment: 9 Active Inactive Electronic Signature(s) Signed: 03/19/2019 2:46:13 PM By: [name], BSN, RN, [name], Kim RN, BSN Entered By: [name] on 03/19/2019 14:46:12 [name] ([id number]) -------------------------------------------------------------------------------- Pain Assessment Details [name], [name]. Date of Service: 03/19/2019 2:00 PM Medical Record Number: [id number] Patient Account Number: [id number] Date of Birth/Sex: 1926/07/30 (85 y.o. F) Treating RN: [name] Primary Care Marckus Hanover: [name] Other Clinician: Referring Shantaya Bluestone: [name], Brittany Treating Oseph Imburgia/Extender: [name] in Treatment: 9 Active Problems Location of Pain Severity and Description of Pain Patient Has Paino No Site Locations Pain Management and Medication Current Pain Management: Electronic Signature(s) Signed: 03/19/2019 4:36:37 PM By: [name] Entered By: [name] on 03/19/2019 13:57:57 Helle, [name] ([id number]) -------------------------------------------------------------------------------- Patient/Caregiver Education Details [name]. Date of Service: 03/19/2019 2:00 PM Medical Record Number: [id number] Patient Account Number: [id number] Date of Birth/Gender: 1926/06/11 (85 y.o. F) Treating RN: [name] Primary Care Physician: [name] Other Clinician: Referring Physician: BABAOFF, Brittany Treating Physician/Extender: [name] in Treatment: 9 Education Assessment Education Provided To: Patient Education Topics Provided Wound/Skin Impairment: Handouts: Caring for Your Ulcer, Other: continue wound care until healed Methods: Demonstration, Explain/Verbal Responses: State content correctly Electronic Signature(s) Signed:  03/25/2019 5:54:26 PM By: [name], BSN, RN, [name], Kim RN, BSN Entered By: [name] on 03/19/2019 14:17:38 [name] ([id number]) -------------------------------------------------------------------------------- Wound Assessment Details [name], [name]. Date of Service: 03/19/2019 2:00 PM Medical Record Number: [id number] Patient Account Number: [id number] Date of Birth/Sex: [birth year] (85 y.o. F) Treating RN: [name] Primary Care Devante Capano: [name], Brittany Other Clinician: Referring Lateisha Thurlow: [name], Brittany Treating Chael Urenda/Extender: [name] in Treatment: 9 Wound Status Wound Number: 2 Primary Trauma, Other Etiology: Wound Location: Left Lower Leg - Anterior Wound Status: Open Wounding Event: Trauma Comorbid Cataracts, Hypertension, Osteoarthritis, Date Acquired: 01/07/2019 History: Dementia [name] Of Treatment: 9 Clustered Wound: No Photos Wound Measurements Length: (cm) 0.2 Width: (cm) 0.2 Depth: (cm) 0.1 Area: (cm) 0.031 Volume: (cm) 0.003 % Reduction in Area: 99.2% % Reduction in Volume: 99.2% Epithelialization: Medium (34-66%) Tunneling: No Undermining: No Wound Description Classification: Partial Thickness Wound Margin: Flat and Intact Exudate Amount: None Present Foul Odor After Cleansing: No Slough/Fibrino Yes Wound Bed Granulation Amount: None Present (0%) Exposed Structure Necrotic Amount: Large (67-100%) Fascia Exposed: No Necrotic Quality: Eschar Fat Layer (Subcutaneous Tissue) Exposed: Yes Tendon Exposed: No Muscle Exposed:  No Joint Exposed: No Bone Exposed: No Electronic Signature(s) Signed: 03/19/2019 4:36:37 PM By: [name] Entered By: [name] on 03/19/2019 14:02:15 [name], [name] ([id number]) [address], [name] ([id number]) -------------------------------------------------------------------------------- Vitals Details [name]. Date of Service:  03/19/2019 2:00 PM Medical Record Number: [id number] Patient Account Number: [id number] Date of Birth/Sex: 10/12/1926 (85 y.o. F) Treating RN: [name] Primary Care Rodnisha Blomgren: [name], Brittany Other Clinician: Referring Kyriaki Moder: [name], Brittany Treating Zakery Normington/Extender: [name] in Treatment: 9 Vital Signs Time Taken: 13:55 Temperature (F): 97.7 Height (in): 64 Pulse (bpm): 84 Weight (lbs): 120 Respiratory Rate (breaths/min): 16 Body Mass Index (BMI): 20.6 Blood Pressure (mmHg): 157/108 Reference Range: 80 - 120 mg / dl Notes manual BP 164/92 - MD notified Electronic Signature(s) Signed: 03/19/2019 4:36:37 PM By: [name] Entered By: [name] on 03/19/2019 14:01:35

## 2019-03-26 NOTE — Progress Notes (Signed)
CHANTRELL, APSEY ([id number]) Visit Report for 03/19/2019 Debridement Details [name], [name]. Date of Service: 03/19/2019 2:00 PM Medical Record Number: [id number] Patient Account Number: [id number] Date of Birth/Sex: [birth year] (85 y.o. F) Treating RN: Cornell Barman Primary Care Provider: BABAOFF, MARCUS Other Clinician: Referring Provider: BABAOFF, MARCUS Treating Provider/Extender: Tito Dine in Treatment: 9 Debridement Performed for Wound #2 Left,Anterior Lower Leg Assessment: Performed By: Physician Ricard Dillon, MD Debridement Type: Debridement Level of Consciousness (Pre- Awake and Alert procedure): Pre-procedure Verification/Time Yes - 14:11 Out Taken: Start Time: 14:11 Pain Control: Lidocaine Total Area Debrided (L x W): 0.2 (cm) x 0.2 (cm) = 0.04 (cm) Tissue and other material Viable, Subcutaneous, Skin: Epidermis debrided: Level: Skin/Subcutaneous Tissue Debridement Description: Excisional Instrument: Curette Bleeding: None End Time: 14:12 Response to Treatment: Procedure was tolerated well Level of Consciousness Awake and Alert (Post-procedure): Post Debridement Measurements of Total Wound Length: (cm) 0.2 Width: (cm) 0.2 Depth: (cm) 0.1 Volume: (cm) 0.003 Character of Wound/Ulcer Post Debridement: Requires Further Debridement Post Procedure Diagnosis Same as Pre-procedure Electronic Signature(s) Signed: 03/19/2019 5:01:45 PM By: Linton Ham MD Signed: 03/25/2019 5:54:26 PM By: Gretta Cool, BSN, RN, [name], Kim RN, BSN Entered By: Linton Ham on 03/19/2019 14:45:25 Kren, [name] ([id number]) -------------------------------------------------------------------------------- HPI Details [name]. Date of Service: 03/19/2019 2:00 PM Medical Record Number: [id number] Patient Account Number: [id number] Date of Birth/Sex: 08/25/1926 (85 y.o. F) Treating RN: Cornell Barman Primary Care Provider: Derinda Late Other Clinician: Referring Provider: BABAOFF, MARCUS Treating Provider/Extender: Tito Dine in Treatment: 9 History of Present Illness HPI Description: 06/06/17 on evaluation today patient presents for evaluation concerning an ulcer which she initially had arise as a result of striking her left lower extremity money bed frame. With that being said this was roughly one month ago and unfortunately the wound despite two courses of Keflex have not really made a dramatic improvement. This has continued to drain as well as being exquisitely tender at times. Even to the point that she is having a difficult time walking. Patient does have chronic atrial fibrillation which subsequently leads to her being on long-term anticoagulant therapy and this causes ED bruising which may have contributed to this entry and where things are at this point. She does have valvular heart disease as well as hypertension and her last INR was 2.7. Currently Neosporin, Gauls, and an ace wrap has been utilized. Keflex is the antibiotic that she has been on. Patient has not had a culture up to this point and she states that her blood pressure at the primary care office yesterday was 138/72. Her pain is significant related to be an eight out of 10. No fevers, chills, nausea, or vomiting noted at this time. 06/20/17; patient was admitted here 2 weeks ago. She had a traumatic wound on her left lateral lower leg probably in the setting of some degree of venous insufficiency with surrounding cellulitis. She had been on Keflex, after we saw her she was put on doxycycline which she apparently did not tolerate. Culture that was done at the time showed Morganella which was resistant to Keflex. Apparently the patient continued to take Keflex after the appointment. She is also on Coumadin. I had tried to change her antibiotic when the Morganella culture was brought to my attention however apparently our staff could  not reach the patient to inform them of the antibiotic change 06/26/17; patient has wound on her left lateral lower extremity in the setting of some  degree of venous insufficiency. I gave her cefdinir last week and she is completing this. There is no evidence of surrounding infection. Aggressive debridement last week, wound bed looks healthier. We've been using Aquacel. They're traveling in the [place] next week we'll see her back in 2 weeks unless there are problems. 07/10/17; the patient is been using Aquacel Ag her husband is changing this with Kerlix and conform. She still complains of a lot of pain. Apparently with the antibiotics. We gave 2 or 3 weeks ago. Her INR went up to 6, this was managed by her primary physician 07/17/17; she is using Aquacel Ag and a border foam. Still discomfort although dimensions are better. 07/24/17; patient or for review of a trauma wound on her left anterior leg in the setting of some degree of chronic venous insufficiency she has been using Aquacel Ag and a border foam and making progress. 07/31/17; only a small open area of this original significant trauma is still open. Her husband is changing this every second day [Aquacel Ag] Readmission: 01/14/19 on evaluation today patient is that she seen for initial inspection during the office visit today concerning a trauma/skin tear to the left anterior lower extremity. This is roughly the same region where she was previously treated and years past when she was here in the clinic. Nonetheless upon evaluation today the patient's wound actually showed signs of decent granulation around the edge although unfortunately the skin flap that was torn back did fold under on itself and the flap itself is dying. She does have some eschar surrounded the edges of the wound at this point. Nonetheless this also was very tender for her. The patient does have a history of hypertension, atrial fibrillation, and long-term use  anticoagulant therapy. 01/21/19 on evaluation today patient's wound on the anterior portion of her lower extremity appears to still be necrotic as far as the surface of the wound is concerned. Unfortunately there is no significant improvement at this point compared to last week they were not able to get the Santyl that are using Medihoney and maybe one reason why. The other is I think this may be stained to drive. Possibly adding something such as mepitel over top of the Medihoney may help to loosen this up quite a bit which I think would be in her best interest. Also she still has a lot of erythema and is very tender to touch I'm afraid that we may need to switch to a different antibiotic to see if this will be of benefit there still really nothing that I can culture to get a better picture of what's going on and what we need to do from the standpoint of antibiotics. I do believe the patient needs a referral Talmuds me to vascular in order to evaluate her blood flow based on what I'm seeing they will be able to perform TBI testing if nothing else along with the vascular evaluation to see if there's any evidence for limited blood flow to this lower Klippel, Hend C. ([id number]) extremity. 4/8; I have reviewed the patient's arterial studies and they are really quite good. There should be no arterial impediments to healing the wound on the right anterior tibial area. 4/15; patient has a small open area over the left mid tibial area. Still requiring debridement we are using Santyl here and Medihoney at home [Santyl unaffordable] 4/22; difficult, presumably traumatic area over the left mid tibial area in the setting of chronic venous insufficiency. We have been using Santyl  here and meta honey at home. The wound surface is getting gradually better and slight improvement in dimensions. Her husband is changing this daily 4/29; left mid tibia presumably traumatic wound in the setting of chronic venous  insufficiency. We have been using Santyl and a combination of Medihoney at home. Arrives today with a much better looking healthy granulated surface. We will change the primary dressing to [facility] 5/6; left mid tibia smaller reasonably healthy looking wound using Hydrofera Blue since last week 5/20-Patient returns to clinic with a left mid tibial wound looking better, we have been using Hydrofera Blue since the previous week 5/27; patient returns to clinic with a left mid tibial wound covered in raised eschar. We have been using Hydrofera Blue her husband changes the dressings using a border foam cover Electronic Signature(s) Signed: 03/19/2019 5:01:45 PM By: Linton Ham MD Entered By: Linton Ham on 03/19/2019 14:46:17 [name], [name] ([id number]) -------------------------------------------------------------------------------- Physical Exam Details [name], [name] Date of Service: 03/19/2019 2:00 PM Medical Record Number: [id number] Patient Account Number: [id number] Date of Birth/Sex: 10-28-1925 (85 y.o. F) Treating RN: Cornell Barman Primary Care Provider: Derinda Late Other Clinician: Referring Provider: BABAOFF, MARCUS Treating Provider/Extender: Ricard Dillon Weeks in Treatment: 9 Constitutional Patient is hypertensive. Very anxious. Pulse regular and within target range for patient.Marland Kitchen Respirations regular, non-labored and within target range.Marland Kitchen appears in no distress. Respiratory Respiratory effort is easy and symmetric bilaterally. Rate is normal at rest and on room air.. Cardiovascular Pedal pulses palpable.. Notes Wound exam; left mid tibia wound. Using a #3 curette I remove the surface eschar some skin from around the circumference and necrotic debris from the wound surface. This cleans up quite nicely and the surface of it looks superficial with healthy granulation. Electronic Signature(s) Signed: 03/19/2019 5:01:45 PM By: Linton Ham  MD Entered By: Linton Ham on 03/19/2019 14:47:48 [name] ([id number]) -------------------------------------------------------------------------------- Physician Orders Details [name] Date of Service: 03/19/2019 2:00 PM Medical Record Number: [id number] Patient Account Number: [id number] Date of Birth/Sex: [birth year] (85 y.o. F) Treating RN: Cornell Barman Primary Care Provider: Derinda Late Other Clinician: Referring Provider: BABAOFF, MARCUS Treating Provider/Extender: Tito Dine in Treatment: 9 Verbal / Phone Orders: Yes Clinician: Cornell Barman Read Back and Verified: No Diagnosis Coding Wound Cleansing Wound #2 Left,Anterior Lower Leg o Clean wound with Normal Saline. o Cleanse wound with mild soap and water o May Shower, gently pat wound dry prior to applying new dressing. Anesthetic (add to Medication List) Wound #2 Left,Anterior Lower Leg o Topical Lidocaine 4% cream applied to wound bed prior to debridement (In Clinic Only). Primary Wound Dressing Wound #2 Left,Anterior Lower Leg o Hydrafera Blue Ready Transfer Secondary Dressing Wound #2 Left,Anterior Lower Leg o Other - coverlet Dressing Change Frequency Wound #2 Left,Anterior Lower Leg o Change dressing every other day. Follow-up Appointments Wound #2 Left,Anterior Lower Leg o Other: - as needed. Edema Control Wound #2 Left,Anterior Lower Leg o Elevate legs to the level of the heart and pump ankles as often as possible Electronic Signature(s) Signed: 03/19/2019 5:01:45 PM By: Linton Ham MD Signed: 03/25/2019 5:54:26 PM By: Gretta Cool, BSN, RN, [name], Kim RN, BSN Entered By: Gretta Cool, BSN, RN, [name], Kim on 03/19/2019 14:13:50 [name] ([id number]) -------------------------------------------------------------------------------- Problem List Details [name], ALL. Date of Service: 03/19/2019 2:00 PM Medical Record Number:  [id number] Patient Account Number: [id number] Date of Birth/Sex: 1926-05-02 (85 y.o. 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 Date of Service: 03/19/2019 2:00 PM Medical Record Number: [id number] Patient Account Number: [id number] Date of Birth/Sex: Jul 01, 1926 (85 y.o. F) Treating RN: Cornell Barman Primary Care Provider: Derinda Late Other Clinician: Referring Provider: BABAOFF, MARCUS Treating Provider/Extender: Ricard Dillon Weeks in Treatment: 9 Subjective History of Present Illness (HPI) 06/06/17 on evaluation today patient presents for evaluation concerning an ulcer which she initially had arise as a result of striking her left lower extremity money bed frame. With that being said this was roughly one month ago and unfortunately the wound despite two courses of Keflex have not really made a dramatic improvement. This has continued to drain as well as being exquisitely tender at times. Even to the point that she is having a difficult time walking. Patient does  have chronic atrial fibrillation which subsequently leads to her being on long-term anticoagulant therapy and this causes ED bruising which may have contributed to this entry and where things are at this point. She does have valvular heart disease as well as hypertension and her last INR was 2.7. Currently Neosporin, Gauls, and an ace wrap has been utilized. Keflex is the antibiotic that she has been on. Patient has not had a culture up to this point and she states that her blood pressure at the primary care office yesterday was 138/72. Her pain is significant related to be an eight out of 10. No fevers, chills, nausea, or vomiting noted at this time. 06/20/17; patient was admitted here 2 weeks ago. She had a traumatic wound on her left lateral lower leg probably in the setting of some degree of venous insufficiency with surrounding cellulitis. She had been on Keflex, after we saw her she was put on doxycycline which she apparently did not tolerate. Culture that was done at the time showed Morganella which was resistant to Keflex. Apparently the patient continued to take Keflex after the appointment. She is also on Coumadin. I had tried to change her antibiotic when the Morganella culture was brought to my attention however apparently our staff could not reach the patient to inform them of the antibiotic change 06/26/17; patient has wound on her left lateral lower extremity in the setting of some degree of venous insufficiency. I gave her cefdinir last week and she is completing this. There is no evidence of surrounding infection. Aggressive debridement last week, wound bed looks healthier. We've been using Aquacel. They're traveling in the [place] next week we'll see her back in 2 weeks unless there are problems. 07/10/17; the patient is been using Aquacel Ag her husband is changing this with Kerlix and conform. She still complains of a lot of pain. Apparently with the antibiotics. We gave 2 or 3  weeks ago. Her INR went up to 6, this was managed by her primary physician 07/17/17; she is using Aquacel Ag and  a border foam. Still discomfort although dimensions are better. 07/24/17; patient or for review of a trauma wound on her left anterior leg in the setting of some degree of chronic venous insufficiency she has been using Aquacel Ag and a border foam and making progress. 07/31/17; only a small open area of this original significant trauma is still open. Her husband is changing this every second day [Aquacel Ag] Readmission: 01/14/19 on evaluation today patient is that she seen for initial inspection during the office visit today concerning a trauma/skin tear to the left anterior lower extremity. This is roughly the same region where she was previously treated and years past when she was here in the clinic. Nonetheless upon evaluation today the patient's wound actually showed signs of decent granulation around the edge although unfortunately the skin flap that was torn back did fold under on itself and the flap itself is dying. She does have some eschar surrounded the edges of the wound at this point. Nonetheless this also was very tender for her. The patient does have a history of hypertension, atrial fibrillation, and long-term use anticoagulant therapy. 01/21/19 on evaluation today patient's wound on the anterior portion of her lower extremity appears to still be necrotic as far as the surface of the wound is concerned. Unfortunately there is no significant improvement at this point compared to last week they were not able to get the Santyl that are using Medihoney and maybe one reason why. The other is I think this may be stained to drive. Possibly adding something such as mepitel over top of the Medihoney may help to loosen this up quite a bit which I think would be in her best interest. Also she still has a lot of erythema and is very tender to touch I'm afraid that we may need to switch  to a different antibiotic to see if this will be of benefit there still really nothing that I can culture to get a better picture of what's going on and what we need to do from the standpoint of antibiotics. I do believe the patient needs a referral Talmuds me to vascular in order to evaluate her blood flow based on what I'm seeing they will be able to perform TBI [name], [name] ([id number]) testing if nothing else along with the vascular evaluation to see if there's any evidence for limited blood flow to this lower extremity. 4/8; I have reviewed the patient's arterial studies and they are really quite good. There should be no arterial impediments to healing the wound on the right anterior tibial area. 4/15; patient has a small open area over the left mid tibial area. Still requiring debridement we are using Santyl here and Medihoney at home [Santyl unaffordable] 4/22; difficult, presumably traumatic area over the left mid tibial area in the setting of chronic venous insufficiency. We have been using Santyl here and meta honey at home. The wound surface is getting gradually better and slight improvement in dimensions. Her husband is changing this daily 4/29; left mid tibia presumably traumatic wound in the setting of chronic venous insufficiency. We have been using Santyl and a combination of Medihoney at home. Arrives today with a much better looking healthy granulated surface. We will change the primary dressing to [facility] 5/6; left mid tibia smaller reasonably healthy looking wound using Hydrofera Blue since last week 5/20-Patient returns to clinic with a left mid tibial wound looking better, we have been using Hydrofera Blue since the previous week 5/27; patient  returns to clinic with a left mid tibial wound covered in raised eschar. We have been using Hydrofera Blue her husband changes the dressings using a border foam cover Objective Constitutional Patient is hypertensive.  Very anxious. Pulse regular and within target range for patient.Marland Kitchen Respirations regular, non-labored and within target range.Marland Kitchen appears in no distress. Vitals Time Taken: 1:55 PM, Height: 64 in, Weight: 120 lbs, BMI: 20.6, Temperature: 97.7 F, Pulse: 84 bpm, Respiratory Rate: 16 breaths/min, Blood Pressure: 157/108 mmHg. General Notes: manual BP 164/92 - MD notified Respiratory Respiratory effort is easy and symmetric bilaterally. Rate is normal at rest and on room air.. Cardiovascular Pedal pulses palpable.. General Notes: Wound exam; left mid tibia wound. Using a #3 curette I remove the surface eschar some skin from around the circumference and necrotic debris from the wound surface. This cleans up quite nicely and the surface of it looks superficial with healthy granulation. Integumentary (Hair, Skin) Wound #2 status is Open. Original cause of wound was Trauma. The wound is located on the Left,Anterior Lower Leg. The wound measures 0.2cm length x 0.2cm width x 0.1cm depth; 0.031cm^2 area and 0.003cm^3 volume. There is Fat Layer (Subcutaneous Tissue) Exposed exposed. There is no tunneling or undermining noted. There is a none present amount of drainage noted. The wound margin is flat and intact. There is no granulation within the wound bed. There is a large (67-100%) amount of necrotic tissue within the wound bed including Eschar. [name], [name] ([id number]) Assessment Active Problems ICD-10 Unspecified open wound, left lower leg, initial encounter Non-pressure chronic ulcer of other part of left lower leg with fat layer exposed Paroxysmal atrial fibrillation Long term (current) use of anticoagulants Essential (primary) hypertension Diagnoses ICD-10 S81.802A: Unspecified open wound, left lower leg, initial encounter L97.822: Non-pressure chronic ulcer of other part of left lower leg with fat layer exposed I48.0: Paroxysmal atrial fibrillation Z79.01: Long term (current) use of  anticoagulants I10: Essential (primary) hypertension Procedures Wound #2 Pre-procedure diagnosis of Wound #2 is a Trauma, Other located on the Left,Anterior Lower Leg . There was a Excisional Skin/Subcutaneous Tissue Debridement with a total area of 0.04 sq cm performed by Ricard Dillon, MD. With the following instrument(s): Curette to remove Viable tissue/material. Material removed includes Subcutaneous Tissue and Skin: Epidermis and after achieving pain control using Lidocaine. No specimens were taken. A time out was conducted at 14:11, prior to the start of the procedure. There was no bleeding. The procedure was tolerated well. Post Debridement Measurements: 0.2cm length x 0.2cm width x 0.1cm depth; 0.003cm^3 volume. Character of Wound/Ulcer Post Debridement requires further debridement. Post procedure Diagnosis Wound #2: Same as Pre-Procedure Plan Wound Cleansing: Wound #2 Left,Anterior Lower Leg: Clean wound with Normal Saline. Cleanse wound with mild soap and water May Shower, gently pat wound dry prior to applying new dressing. Anesthetic (add to Medication List): Wound #2 Left,Anterior Lower Leg: Topical Lidocaine 4% cream applied to wound bed prior to debridement (In Clinic Only). Primary Wound Dressing: Wound #2 Left,Anterior Lower Leg: Hydrafera Blue Ready Transfer Secondary Dressing: Wound #2 Left,Anterior Lower Leg: Other - coverlet Dressing Change Frequency: [name], [name]. ([id number]) Wound #2 Left,Anterior Lower Leg: Change dressing every other day. Follow-up Appointments: Wound #2 Left,Anterior Lower Leg: Other: - as needed. Edema Control: Wound #2 Left,Anterior Lower Leg: Elevate legs to the level of the heart and pump ankles as often as possible 1 continue with Hydrofera Blue and the border foam cover. This looks like it is on its way to  healing. 2. [place] are [place]. They will not be back. I think this should  close in a week or 2 and I told her husband this. Electronic Signature(s) Signed: 03/19/2019 5:01:45 PM By: Linton Ham MD Entered By: Linton Ham on 03/19/2019 14:51:46 [name], [name] ([id number]) -------------------------------------------------------------------------------- SuperBill Details [name] Date of Service: 03/19/2019 Medical Record Number: [id number] Patient Account Number: [id number] Date of Birth/Sex: October 02, 1926 (85 y.o. F) Treating RN: Cornell Barman Primary Care Provider: Derinda Late Other Clinician: Referring Provider: BABAOFF, MARCUS Treating Provider/Extender: Tito Dine in Treatment: 9 Diagnosis Coding ICD-10 Codes Code Description S81.802A Unspecified open wound, left lower leg, initial encounter L97.822 Non-pressure chronic ulcer of other part of left lower leg with fat layer exposed I48.0 Paroxysmal atrial fibrillation Z79.01 Long term (current) use of anticoagulants I10 Essential (primary) hypertension Facility Procedures CPT4 Code: 32355732 Description: 20254 - DEB SUBQ TISSUE 20 SQ CM/< ICD-10 Diagnosis Description S81.802A Unspecified open wound, left lower leg, initial encounter Modifier: Quantity: 1 Physician Procedures CPT4 Code: 2706237 Description: 62831 - WC PHYS SUBQ TISS 20 SQ CM ICD-10 Diagnosis Description S81.802A Unspecified open wound, left lower leg, initial encounter Modifier: Quantity: 1 Electronic Signature(s) Signed: 03/19/2019 5:01:45 PM By: Linton Ham MD Entered By: Linton Ham on 03/19/2019 14:52:04

## 2019-07-25 ENCOUNTER — Other Ambulatory Visit: Payer: Self-pay | Admitting: Neurology

## 2019-07-25 DIAGNOSIS — G3184 Mild cognitive impairment, so stated: Secondary | ICD-10-CM

## 2019-08-06 ENCOUNTER — Other Ambulatory Visit: Payer: Self-pay

## 2019-08-06 ENCOUNTER — Ambulatory Visit
Admission: RE | Admit: 2019-08-06 | Discharge: 2019-08-06 | Disposition: A | Discharge status: Home / Self Care | Payer: Medicare Other | Source: Ambulatory Visit | Attending: Neurology | Admitting: Neurology

## 2019-08-06 DIAGNOSIS — G3184 Mild cognitive impairment, so stated: Secondary | ICD-10-CM | POA: Diagnosis not present

## 2020-01-06 ENCOUNTER — Other Ambulatory Visit: Payer: Self-pay | Admitting: Orthopedic Surgery

## 2020-01-06 ENCOUNTER — Other Ambulatory Visit: Payer: Self-pay

## 2020-01-06 ENCOUNTER — Ambulatory Visit
Admission: RE | Admit: 2020-01-06 | Discharge: 2020-01-06 | Disposition: A | Discharge status: Home / Self Care | Payer: Medicare Other | Source: Ambulatory Visit | Attending: Orthopedic Surgery | Admitting: Orthopedic Surgery

## 2020-01-06 DIAGNOSIS — S22070A Wedge compression fracture of T9-T10 vertebra, initial encounter for closed fracture: Secondary | ICD-10-CM | POA: Diagnosis not present

## 2020-01-12 ENCOUNTER — Other Ambulatory Visit: Payer: Self-pay | Admitting: Orthopedic Surgery

## 2020-01-15 ENCOUNTER — Encounter
Admission: RE | Admit: 2020-01-15 | Discharge: 2020-01-15 | Disposition: A | Discharge status: Home / Self Care | Payer: Medicare Other | Source: Ambulatory Visit | Attending: Orthopedic Surgery | Admitting: Orthopedic Surgery

## 2020-01-15 ENCOUNTER — Other Ambulatory Visit: Payer: Self-pay

## 2020-01-15 DIAGNOSIS — Z01818 Encounter for other preprocedural examination: Secondary | ICD-10-CM | POA: Insufficient documentation

## 2020-01-15 NOTE — Patient Instructions (Addendum)
Your procedure is scheduled on: Tuesday January 20, 2020 Report to Day Surgery. To find out your arrival time please call [phone] between 1PM - 3PM on Monday January 19, 2020.  Remember: Instructions that are not followed completely may result in serious medical risk,  up to and including death, or upon the discretion of your surgeon and anesthesiologist your  surgery may need to be rescheduled.     _X__ 1. Do not eat food after midnight the night before your procedure.                 No gum chewing or hard candies. You may drink clear liquids up to 2 hours                 before you are scheduled to arrive for your surgery- DO not drink clear                 liquids within 2 hours of the start of your surgery.                 Clear Liquids include:  water, apple juice without pulp, clear Gatorade, G2 or                  Gatorade Zero (avoid Red/Purple/Blue), Black Coffee or Tea (Do not add                 anything to coffee or tea).  __x__2.   Complete the carbohydrate drink provided to you, 2 hours before arrival.  __X__3.  On the morning of surgery brush your teeth with toothpaste and water, you                may rinse your mouth with mouthwash if you wish.  Do not swallow any toothpaste of mouthwash.     _X__ 4.  No Alcohol for 24 hours before or after surgery.   _X__ 5.  Do Not Smoke or use e-cigarettes For 24 Hours Prior to Your Surgery.                 Do not use any chewable tobacco products for at least 6 hours prior to                 surgery.  __x__ 6.  Notify your doctor if there is any change in your medical condition      (cold, fever, infections).     Do not wear jewelry, make-up, hairpins, clips or nail polish. Do not wear lotions, powders, or perfumes. You may wear deodorant. Do not shave 48 hours prior to surgery. Men may shave face and neck. Do not bring valuables to the hospital.    [facility] is not responsible for any belongings or  valuables.  Contacts, dentures or bridgework may not be worn into surgery. Leave your suitcase in the car. After surgery it may be brought to your room. For patients admitted to the hospital, discharge time is determined by your treatment team.   Patients discharged the day of surgery will not be allowed to drive home.   Make arrangements for someone to be with you for the first 24 hours of your Same Day Discharge.   __x__ Take these medicines the morning of surgery with A SIP OF WATER:    1. acetaminophen (TYLENOL) 500 MG  2. amLODipine (NORVASC) 5  __x__ Use CHG (or wipes) as directed  __x__ Stop Coumadin on 01/13/2020  __x__ Stop Anti-inflammatories such  as ibuprofen, Aleve, naproxen, aspirin and or BC Powders.    __x__ Stop supplements until after surgery.    __x__ Do not start any herbal supplements before your surgery.

## 2020-01-16 ENCOUNTER — Other Ambulatory Visit: Admission: RE | Admit: 2020-01-16 | Payer: Medicare Other | Source: Ambulatory Visit

## 2020-01-16 ENCOUNTER — Other Ambulatory Visit: Payer: Medicare Other

## 2020-01-19 ENCOUNTER — Other Ambulatory Visit: Payer: Self-pay

## 2020-01-19 ENCOUNTER — Encounter
Admission: RE | Admit: 2020-01-19 | Discharge: 2020-01-19 | Disposition: A | Discharge status: Home / Self Care | Payer: Medicare Other | Source: Ambulatory Visit | Attending: Orthopedic Surgery | Admitting: Orthopedic Surgery

## 2020-01-19 DIAGNOSIS — Z01818 Encounter for other preprocedural examination: Secondary | ICD-10-CM | POA: Insufficient documentation

## 2020-01-19 DIAGNOSIS — Z20822 Contact with and (suspected) exposure to covid-19: Secondary | ICD-10-CM | POA: Diagnosis not present

## 2020-01-19 DIAGNOSIS — I10 Essential (primary) hypertension: Secondary | ICD-10-CM | POA: Insufficient documentation

## 2020-01-19 LAB — BASIC METABOLIC PANEL
Anion gap: 15 (ref 5–15)
BUN: 8 mg/dL (ref 8–23)
CO2: 27 mmol/L (ref 22–32)
Calcium: 9.2 mg/dL (ref 8.9–10.3)
Chloride: 97 mmol/L — ABNORMAL LOW (ref 98–111)
Creatinine, Ser: 0.44 mg/dL (ref 0.44–1.00)
GFR calc Af Amer: >60 mL/min (ref >60)
GFR calc non Af Amer: >60 mL/min (ref >60)
Glucose, Bld: 90 mg/dL (ref 70–99)
Potassium: 3.3 mmol/L — ABNORMAL LOW (ref 3.5–5.1)
Sodium: 139 mmol/L (ref 135–145)

## 2020-01-19 LAB — CBC
HCT: 49 % — ABNORMAL HIGH (ref 36.0–46.0)
Hemoglobin: 17 g/dL — ABNORMAL HIGH (ref 12.0–15.0)
MCH: 32.6 pg (ref 26.0–34.0)
MCHC: 34.7 g/dL (ref 30.0–36.0)
MCV: 94 fL (ref 80.0–100.0)
Platelets: 249 10*3/uL (ref 150–400)
RBC: 5.21 MIL/uL — ABNORMAL HIGH (ref 3.87–5.11)
RDW: 12.9 % (ref 11.5–15.5)
WBC: 5 10*3/uL (ref 4.0–10.5)
nRBC: 0 % (ref 0.0–0.2)

## 2020-01-19 LAB — SARS CORONAVIRUS 2 (TAT 6-24 HRS): SARS Coronavirus 2: NEGATIVE

## 2020-01-19 NOTE — Pre-Procedure Instructions (Signed)
Potassium 3.3, faxed results to Dr. Rudene Christians and notified anesthesia.  Will recheck in a.m.

## 2020-01-19 NOTE — Pre-Procedure Instructions (Addendum)
Pre-Admit Testing Provider Communication Note  Provider: Dr. Nehemiah Massed (patient's Cardiologist)  Notification Mode: Secure Chat  Reason: Abnormal EKG  Response:   Additional Information: Printed and placed on chart. Noted on Pre-Admit worksheet.  Signed: Beulah Gandy, RN

## 2020-01-20 ENCOUNTER — Encounter
Admission: RE | Disposition: A | Discharge status: Home / Self Care | Payer: Self-pay | Source: Home / Self Care | Attending: Orthopedic Surgery

## 2020-01-20 ENCOUNTER — Ambulatory Visit: Payer: Medicare Other | Admitting: Anesthesiology

## 2020-01-20 ENCOUNTER — Other Ambulatory Visit: Payer: Self-pay

## 2020-01-20 ENCOUNTER — Ambulatory Visit: Payer: Medicare Other

## 2020-01-20 ENCOUNTER — Ambulatory Visit
Admission: RE | Admit: 2020-01-20 | Discharge: 2020-01-20 | Disposition: A | Discharge status: Home / Self Care | Payer: Medicare Other | Attending: Orthopedic Surgery | Admitting: Orthopedic Surgery

## 2020-01-20 ENCOUNTER — Encounter: Payer: Self-pay | Admitting: Orthopedic Surgery

## 2020-01-20 DIAGNOSIS — I4891 Unspecified atrial fibrillation: Secondary | ICD-10-CM | POA: Insufficient documentation

## 2020-01-20 DIAGNOSIS — F0390 Unspecified dementia without behavioral disturbance: Secondary | ICD-10-CM | POA: Insufficient documentation

## 2020-01-20 DIAGNOSIS — T148XXA Other injury of unspecified body region, initial encounter: Secondary | ICD-10-CM

## 2020-01-20 DIAGNOSIS — S22070A Wedge compression fracture of T9-T10 vertebra, initial encounter for closed fracture: Secondary | ICD-10-CM | POA: Insufficient documentation

## 2020-01-20 DIAGNOSIS — Z85828 Personal history of other malignant neoplasm of skin: Secondary | ICD-10-CM | POA: Diagnosis not present

## 2020-01-20 DIAGNOSIS — X58XXXA Exposure to other specified factors, initial encounter: Secondary | ICD-10-CM | POA: Insufficient documentation

## 2020-01-20 DIAGNOSIS — Z87891 Personal history of nicotine dependence: Secondary | ICD-10-CM | POA: Diagnosis not present

## 2020-01-20 DIAGNOSIS — I10 Essential (primary) hypertension: Secondary | ICD-10-CM | POA: Insufficient documentation

## 2020-01-20 DIAGNOSIS — Z79899 Other long term (current) drug therapy: Secondary | ICD-10-CM | POA: Insufficient documentation

## 2020-01-20 DIAGNOSIS — Z7901 Long term (current) use of anticoagulants: Secondary | ICD-10-CM | POA: Insufficient documentation

## 2020-01-20 DIAGNOSIS — S22050A Wedge compression fracture of T5-T6 vertebra, initial encounter for closed fracture: Secondary | ICD-10-CM | POA: Diagnosis not present

## 2020-01-20 HISTORY — PX: KYPHOPLASTY: SHX5884

## 2020-01-20 LAB — POCT I-STAT, CHEM 8
BUN: 5 mg/dL — ABNORMAL LOW (ref 8–23)
Calcium, Ion: 1.04 mmol/L — ABNORMAL LOW (ref 1.15–1.40)
Chloride: 99 mmol/L (ref 98–111)
Creatinine, Ser: 0.4 mg/dL — ABNORMAL LOW (ref 0.44–1.00)
Glucose, Bld: 137 mg/dL — ABNORMAL HIGH (ref 70–99)
HCT: 47 % — ABNORMAL HIGH (ref 36.0–46.0)
Hemoglobin: 16 g/dL — ABNORMAL HIGH (ref 12.0–15.0)
Potassium: 3 mmol/L — ABNORMAL LOW (ref 3.5–5.1)
Sodium: 135 mmol/L (ref 135–145)
TCO2: 25 mmol/L (ref 22–32)

## 2020-01-20 LAB — PROTIME-INR
INR: 1 (ref 0.8–1.2)
Prothrombin Time: 13 seconds (ref 11.4–15.2)

## 2020-01-20 LAB — APTT: aPTT: 31 seconds (ref 24–36)

## 2020-01-20 SURGERY — KYPHOPLASTY
Anesthesia: General | Site: Back

## 2020-01-20 MED ORDER — FENTANYL CITRATE (PF) 100 MCG/2ML IJ SOLN
INTRAMUSCULAR | Status: AC
  Filled 2020-01-20: qty 2

## 2020-01-20 MED ORDER — PROPOFOL 500 MG/50ML IV EMUL
INTRAVENOUS | Status: DC | PRN
  Administered 2020-01-20: 40 ug/kg/min via INTRAVENOUS

## 2020-01-20 MED ORDER — ACETAMINOPHEN 10 MG/ML IV SOLN
INTRAVENOUS | Status: AC
  Filled 2020-01-20: qty 100

## 2020-01-20 MED ORDER — ACETAMINOPHEN 10 MG/ML IV SOLN
INTRAVENOUS | Status: DC | PRN
  Administered 2020-01-20: 1000 mg via INTRAVENOUS

## 2020-01-20 MED ORDER — IOHEXOL 180 MG/ML  SOLN
INTRAMUSCULAR | Status: DC | PRN
  Administered 2020-01-20: 20 mL

## 2020-01-20 MED ORDER — BUPIVACAINE-EPINEPHRINE (PF) 0.5% -1:200000 IJ SOLN
INTRAMUSCULAR | Status: DC | PRN
  Administered 2020-01-20: 20 mL via PERINEURAL

## 2020-01-20 MED ORDER — CEFAZOLIN SODIUM-DEXTROSE 2-4 GM/100ML-% IV SOLN
2.0000 g | INTRAVENOUS | Status: AC
  Administered 2020-01-20: 2 g via INTRAVENOUS

## 2020-01-20 MED ORDER — PROPOFOL 500 MG/50ML IV EMUL
INTRAVENOUS | Status: AC
  Filled 2020-01-20: qty 50

## 2020-01-20 MED ORDER — LIDOCAINE HCL 1 % IJ SOLN
INTRAMUSCULAR | Status: DC | PRN
  Administered 2020-01-20: 20 mL
  Administered 2020-01-20: 10 mL

## 2020-01-20 MED ORDER — BUPIVACAINE HCL (PF) 0.5 % IJ SOLN
INTRAMUSCULAR | Status: AC
  Filled 2020-01-20: qty 30

## 2020-01-20 MED ORDER — FAMOTIDINE 20 MG PO TABS
ORAL_TABLET | ORAL | Status: AC
  Filled 2020-01-20: qty 1

## 2020-01-20 MED ORDER — CEFAZOLIN SODIUM-DEXTROSE 2-4 GM/100ML-% IV SOLN
INTRAVENOUS | Status: AC
  Filled 2020-01-20: qty 100

## 2020-01-20 MED ORDER — FENTANYL CITRATE (PF) 100 MCG/2ML IJ SOLN
INTRAMUSCULAR | Status: DC | PRN
  Administered 2020-01-20: 25 ug via INTRAVENOUS

## 2020-01-20 MED ORDER — LIDOCAINE HCL (PF) 1 % IJ SOLN
INTRAMUSCULAR | Status: AC
  Filled 2020-01-20: qty 60

## 2020-01-20 MED ORDER — LACTATED RINGERS IV SOLN: INTRAVENOUS | Status: DC

## 2020-01-20 MED ORDER — EPINEPHRINE PF 1 MG/ML IJ SOLN
INTRAMUSCULAR | Status: AC
  Filled 2020-01-20: qty 1

## 2020-01-20 MED ORDER — FAMOTIDINE 20 MG PO TABS
20.0000 mg | ORAL_TABLET | Freq: Once | ORAL | Status: AC
  Administered 2020-01-20: 20 mg via ORAL

## 2020-01-20 MED ORDER — CHLORHEXIDINE GLUCONATE 4 % EX LIQD
60.0000 mL | Freq: Once | CUTANEOUS | Status: AC
  Administered 2020-01-20: 4 via TOPICAL

## 2020-01-20 MED ORDER — PROPOFOL 10 MG/ML IV BOLUS
INTRAVENOUS | Status: AC
  Filled 2020-01-20: qty 40

## 2020-01-20 SURGICAL SUPPLY — 24 items
ADH SKN CLS APL DERMABOND .7 (GAUZE/BANDAGES/DRESSINGS) ×1
BNDG ADH 2 X3.75 FABRIC TAN LF (GAUZE/BANDAGES/DRESSINGS) ×1 IMPLANT
BNDG ADH XL 3.75X2 STRCH LF (GAUZE/BANDAGES/DRESSINGS) ×1
CEMENT KYPHON CX01A KIT/MIXER (Cement) ×2 IMPLANT
COVER WAND RF STERILE (DRAPES) ×2 IMPLANT
DERMABOND ADVANCED (GAUZE/BANDAGES/DRESSINGS) ×1
DERMABOND ADVANCED .7 DNX12 (GAUZE/BANDAGES/DRESSINGS) ×1 IMPLANT
DEVICE BIOPSY BONE KYPH (INSTRUMENTS) ×1 IMPLANT
DEVICE BIOPSY BONE KYPHX (INSTRUMENTS) ×2 IMPLANT
DRAPE C-ARM XRAY 36X54 (DRAPES) ×2 IMPLANT
DURAPREP 26ML APPLICATOR (WOUND CARE) ×2 IMPLANT
FEE RENTAL RFA GENERATOR (MISCELLANEOUS) IMPLANT
GLOVE SURG SYN 9.0  PF PI (GLOVE) ×2
GLOVE SURG SYN 9.0 PF PI (GLOVE) ×1 IMPLANT
GOWN SRG 2XL LVL 4 RGLN SLV (GOWNS) ×1 IMPLANT
GOWN STRL NON-REIN 2XL LVL4 (GOWNS) ×2
GOWN STRL REUS W/ TWL LRG LVL3 (GOWN DISPOSABLE) ×1 IMPLANT
GOWN STRL REUS W/TWL LRG LVL3 (GOWN DISPOSABLE) ×2
PACK KYPHOPLASTY (MISCELLANEOUS) ×2 IMPLANT
RENTAL RFA GENERATOR (MISCELLANEOUS) IMPLANT
STRAP SAFETY 5IN WIDE (MISCELLANEOUS) ×2 IMPLANT
TRAY KYPHOPAK 15/2 EXPRESS (KITS) ×1 IMPLANT
TRAY KYPHOPAK 15/3 EXPRESS 1ST (MISCELLANEOUS) ×2 IMPLANT
TRAY KYPHOPAK 20/3 EXPRESS 1ST (MISCELLANEOUS) ×2 IMPLANT

## 2020-01-20 NOTE — Discharge Instructions (Addendum)
Take it easy today and tomorrow.  Remove Band-Aids on Thursday.  Try to avoid lifting over 5 pounds for the next 2 weeks. Call office if your pain starts increasing. Pain medicine as directed.   AMBULATORY SURGERY  DISCHARGE INSTRUCTIONS   1) The drugs that you were given will stay in your system until tomorrow so for the next 24 hours you should not:  A) Drive an automobile B) Make any legal decisions C) Drink any alcoholic beverage   2) You may resume regular meals tomorrow.  Today it is better to start with liquids and gradually work up to solid foods.  You may eat anything you prefer, but it is better to start with liquids, then soup and crackers, and gradually work up to solid foods.   3) Please notify your doctor immediately if you have any unusual bleeding, trouble breathing, redness and pain at the surgery site, drainage, fever, or pain not relieved by medication.    4) Additional Instructions:        Please contact your physician with any problems or Same Day Surgery at [phone], Monday through Friday 6 am to 4 pm, or  at [facility] number at [phone].

## 2020-01-20 NOTE — Op Note (Signed)
Date 01/20/2020  time 3:30 PM   PATIENT:  [name]   PRE-OPERATIVE DIAGNOSIS:  closed wedge compression fracture of T6 and T9   POST-OPERATIVE DIAGNOSIS:  closed wedge compression fracture of T6 and T9   PROCEDURE:  Procedure(s): KYPHOPLASTY T6 and T9  SURGEON: Laurene Footman, MD   ASSISTANTS: None   ANESTHESIA:   local and MAC   EBL:  No intake/output data recorded.   BLOOD ADMINISTERED:none   DRAINS: none    LOCAL MEDICATIONS USED:  MARCAINE    and XYLOCAINE    SPECIMEN:   T6 and T9 vertebral body biopsies   DISPOSITION OF SPECIMEN:  Pathology   COUNTS:  YES   TOURNIQUET:  * No tourniquets in log *   IMPLANTS: Bone cement   DICTATION: .Dragon Dictation  patient was brought to the operating room and after adequate anesthesia was obtained the patient was placed prone.  C arm was brought in in good visualization of the affected level obtained on both AP and lateral projections.  After patient identification and timeout procedures were completed, local anesthetic was infiltrated with 10 cc 1% Xylocaine infiltrated subcutaneously.  This is done the area on the right side of the planned approach at T6 and on the left side at T9.  The back was then prepped and draped in the usual sterile manner and repeat timeout procedure carried out.  A spinal needle was brought down to the pedicle on the right side of  T6 and left side of T9 and a 50-50 mix of 1% Xylocaine half percent Sensorcaine with epinephrine total of 20 cc injected.  After allowing this to set a small incision was made and the trocar was advanced into the vertebral body in an extrapedicular fashion.  Biopsy was obtained at each level Drilling was carried out balloon inserted with inflation to  1-1/2 cc at each level.  When the cement was appropriate consistency 2 cc were injected into the vertebral body without extravasation, good fill superior to inferior endplates and from right to left sides along the inferior  endplate.  After the cement had set the trochar was removed and permanent C-arm views obtained.  The wound was closed with Dermabond followed by Band-Aid   PLAN OF CARE: Discharge to home after PACU   PATIENT DISPOSITION:  PACU - hemodynamically stable.

## 2020-01-20 NOTE — Anesthesia Preprocedure Evaluation (Signed)
Anesthesia Evaluation  Patient identified by MRN, date of birth, ID band Patient confused  General Assessment Comment:Awake alert, but confused about certain things, does not know which procedure she's having  Reviewed: Allergy & Precautions, NPO status , Patient's Chart, lab work & pertinent test results  History of Anesthesia Complications Negative for: history of anesthetic complications  Airway Mallampati: II  TM Distance: >3 FB Neck ROM: Full    Dental no notable dental hx. (+) Teeth Intact   Pulmonary neg pulmonary ROS, neg sleep apnea, neg COPD, Patient abstained from smoking.Not current smoker, former smoker,    Pulmonary exam normal breath sounds clear to auscultation       Cardiovascular Exercise Tolerance: Good METShypertension, (-) CAD and (-) Past MI + dysrhythmias Atrial Fibrillation + Valvular Problems/Murmurs MR  Rhythm:Irregular Rate:Normal - Systolic murmurs TTE [id number]: NORMAL LEFT VENTRICULAR SYSTOLIC FUNCTION WITH AN ESTIMATED EF = 55 % NORMAL RIGHT VENTRICULAR SYSTOLIC FUNCTION SEVERE TRICUSPID AND MITRAL VALVE INSUFFICIENCY TRACE AORTIC VALVE INSUFFICIENCY NO VALVULAR STENOSIS SEVERE BIATRIAL ENLARGEMENT MODERATE RV ENLARGEMENT MILD LVH   Neuro/Psych PSYCHIATRIC DISORDERS Dementia negative neurological ROS     GI/Hepatic neg GERD  ,(+)     (-) substance abuse  ,   Endo/Other  neg diabetes  Renal/GU negative Renal ROS     Musculoskeletal   Abdominal   Peds  Hematology   Anesthesia Other Findings Past Medical History: No date: Atrial fibrillation ([place]) No date: Cancer ([place])     Comment:  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 No date: Hypertension No date: Irregular heart beat No date: Memory difficulty No date: Meniere's disease  Reproductive/Obstetrics                            Anesthesia Physical Anesthesia Plan  ASA: III  Anesthesia Plan: General   Post-op Pain  Management:    Induction: Intravenous  PONV Risk Score and Plan: 3 and Ondansetron, Dexamethasone, Propofol infusion and TIVA  Airway Management Planned: Natural Airway  Additional Equipment: None  Intra-op Plan:   Post-operative Plan: Extubation in OR  Informed Consent: I have reviewed the patients History and Physical, chart, labs and discussed the procedure including the risks, benefits and alternatives for the proposed anesthesia with the patient or authorized representative who has indicated his/her understanding and acceptance.     Dental advisory given and Consent reviewed with POA  Plan Discussed with: CRNA and Surgeon  Anesthesia Plan Comments: (Discussed risks of anesthesia with patient as well as her husband and daughter who were both at bedside, including PONV, difficulty with spontaneou ventilation necessitating airway intervention and its associated risks such as sore throat, lip/dental damage. Rare risks discussed as well, such as cardiorespiratory and neurological sequelae. Patient and family understands.)        Anesthesia Quick Evaluation

## 2020-01-20 NOTE — Transfer of Care (Signed)
Immediate Anesthesia Transfer of Care Note  Patient: [name]  Procedure(s) Performed: T9 AND T6 KYPHOPLASTY (N/A Back)  Patient Location: PACU  Anesthesia Type:General  Level of Consciousness: awake  Airway & Oxygen Therapy: Patient connected to nasal cannula oxygen  Post-op Assessment: Post -op Vital signs reviewed and stable  Post vital signs: stable  Last Vitals:  Vitals Value Taken Time  BP 85/54 01/20/20 1531  Temp    Pulse 85 01/20/20 1532  Resp 20 01/20/20 1532  SpO2 81 % 01/20/20 1532  Vitals shown include unvalidated device data.  Last Pain:  Vitals:   01/20/20 1315  TempSrc: Oral         Complications: No apparent anesthesia complications

## 2020-01-20 NOTE — Anesthesia Postprocedure Evaluation (Signed)
Anesthesia Post Note  Patient: [name]  Procedure(s) Performed: T9 AND T6 KYPHOPLASTY (N/A Back)  Patient location during evaluation: PACU Anesthesia Type: General Level of consciousness: awake and alert and confused (at baseline level of dementia) Pain management: pain level controlled Vital Signs Assessment: post-procedure vital signs reviewed and stable Respiratory status: spontaneous breathing, nonlabored ventilation, respiratory function stable and patient connected to nasal cannula oxygen Cardiovascular status: blood pressure returned to baseline and stable Postop Assessment: no apparent nausea or vomiting Anesthetic complications: no     Last Vitals:  Vitals:   01/20/20 1315 01/20/20 1531  BP: (!) 180/94 (!) 85/54  Pulse: 85 (!) 116  Resp: 16 20  Temp: 36.4 C (!) 36.1 C  SpO2: 98% 92%    Last Pain:  Vitals:   01/20/20 1531  TempSrc:   PainSc: 0-No pain                 Arita Miss

## 2020-01-20 NOTE — H&P (Signed)
Chief Complaint  Patient presents with  . Spine - Pain   [name] is a 86 y.o. female who presents today for evaluation of midline thoracic back pain. Patient has a history of L4-L5, T7-T8 kyphoplasty procedures performed in 2017 in [place] at [facility]. She did well with these procedures. 1 month ago developed some midthoracic back pain which is similar to her previous compression fracture pain. Pain has been moderate to severe. Taken Tylenol 2 tablets twice daily. No numbness tingling radicular symptoms. Pain is constant increased with activity. She enjoys exercising but has been limited due to the pain.  Past Medical History: Past Medical History:  Diagnosis Date  . Arthritis  . Atrial fibrillation (CMS-HCC)  . Bilateral cataracts  . Hyperlipidemia  . Hypertension  . Meniere's disease  Status post Menir's surgery with excellent results.  . Shingles  . Valvular heart disease   Past Surgical History: Past Surgical History:  Procedure Laterality Date  . Basal cell carcinoma resection from ear  . Menir's disease surgery  . Thyroglossal duct cyst removal   Past Family History: Family History  Problem Relation Age of Onset  . No Known Problems Mother  . Stroke Father  . Coronary Artery Disease (Blocked arteries around heart) Father  . Cancer Brother   Medications: Current Outpatient Medications Ordered in Epic  Medication Sig Dispense Refill  . amLODIPine (NORVASC) 5 MG tablet TAKE ONE TABLET BY MOUTH EVERY DAY 30 tablet 5  . losartan (COZAAR) 50 MG tablet Take 1 tablet (50 mg total) by mouth once daily 30 tablet 11  . memantine (NAMENDA) 5 MG tablet Take 1 tablet (5 mg total) by mouth 2 (two) times daily 180 tablet 1  . mirtazapine (REMERON) 15 MG tablet TAKE ONE TABLET ONE HOUR PRIOR TO BEDTIME 90 tablet 1  . warfarin (COUMADIN) 5 MG tablet TAKE ONE TABLET BY MOUTH EVERY EVENING 30 tablet 5   No current Epic-ordered facility-administered medications  on file.   Allergies: No Known Allergies   Review of Systems:  A comprehensive 14 point ROS was performed, reviewed by me today, and the pertinent orthopaedic findings are documented in the HPI.  Exam: BP (!) 194/102  Wt 47 kg (103 lb 9.6 oz)  BMI 20.23 kg/m  General:  Well developed, well nourished, no apparent distress, normal affect, pain with standing. Ambulates no assistive devices.  HEENT: Head normocephalic, atraumatic, PERRL.   Abdomen: Soft, non tender, non distended, Bowel sounds present.  Heart: Examination of the heart reveals regular, rate, and rhythm. There is no murmur noted on ascultation. There is a normal apical pulse.  Lungs: Lungs are clear to auscultation. There is no wheeze, rhonchi, or crackles. There is normal expansion of bilateral chest walls.   Thoracic spine: Thoracic spine shows spinous process tenderness just below the bra line along the mid thoracic spine. Mild grimacing with pain with percussion. Very little paravertebral muscle tenderness. Full range of motion of both hips with no discomfort. Neuro vas intact in bilateral lower extremities.  AP and lateral views of the thoracolumbar spine are ordered interpreted by me in the office today. Impression: Patient has significant facet arthritis, osteopenia. Evidence of methylmethacrylate present along the L4 and L5 vertebral bodies as well as T7 and T8 vertebral bodies. There is 60% loss of vertebral body height of T9 which appears to be new when compared to previous imaging. Midthoracic and thoracolumbar displaced degeneration.  CLINICAL DATA: Mid back pain. History of compression fractures with kyphoplasty  EXAM: MRI THORACIC SPINE WITHOUT CONTRAST  TECHNIQUE: Multiplanar, multisequence MR imaging of the thoracic spine was performed. No intravenous contrast was administered.  COMPARISON: 03/02/2016  FINDINGS: Alignment: Maintained thoracic kyphosis. No static listhesis.  Vertebrae:  Chronic mild superior endplate compression deformities of the T7 and T8 vertebral bodies status post cement augmentation. No residual marrow edema within these levels.  Acute compression fracture of the T6 vertebral body with approximately 60% vertebral body height loss. No bony retropulsion.  Acute compression fracture of the T9 vertebral body involving the inferior endplate with less than 10% vertebral body height loss. There is 3 mm bony retropulsion without evidence of cord compression.  There is mild marrow edema within the T3 vertebral body without vertebral body height loss.  No evidence of discitis. Degenerative endplate marrow changes throughout the thoracic spine.  Cord: Unchanged small syrinx extending from the T7-T10 levels.  Paraspinal and other soft tissues: Small right and trace left pleural effusions. Multiple cystic lesions within the visualized portion of the liver, grossly unchanged from prior, incompletely evaluated.  Disc levels:  Mild canal stenosis at the T9 level at site of compression fracture with bony retropulsion. No cord signal change or cord compression.  Similar degree of multilevel degenerative disc disease throughout the thoracic spine with small posterior disc bulges from T2-3 through T5-6. No high-grade foraminal or canal stenosis is seen throughout the thoracic spine.  Visualized upper lumbar spine demonstrates multilevel spondylosis including a central disc protrusion at L1-2 with 7 mm caudal extension of disc material.  IMPRESSION: 1. Acute compression fracture of the T6 vertebral body with approximately 60% vertebral body height loss. 2. Acute T9 inferior endplate compression fracture with less than 10% vertebral body height loss. There is mild bony retropulsion at the fracture site resulting in mild canal stenosis without evidence of cord compression. 3. Mild marrow edema within the T3 vertebral body without associated vertebral  body height loss. Developing compression fracture at this site not excluded. 4. Chronic mild T7 and T8 superior endplate compression deformities status post cement augmentation. 5. Stable small syrinx extending from T7-T10 levels. 6. Stable multilevel degenerative changes of the thoracic spine. 7. Small right and trace left pleural effusions.  These results will be called to the ordering clinician or representative by the Radiologist Assistant, and communication documented in the PACS or [employer].  Electronically Signed By: Davina Poke D.O. On: 01/06/2020 14:51  Impression: Closed wedge compression fracture of T9 vertebra, initial encounter (CMS-HCC) [S22.070A] Closed wedge compression fracture of T9 vertebra, initial encounter (CMS-HCC) (primary encounter diagnosis) Closed wedge compression fracture of T6 vertebra, initial encounter (CMS-HCC)  Plan:  75. 86 year old female with acute T9 and T6 compression fractures. Pain has been severe and limiting her ability to be active during the day. She will continue with Tylenol as needed for pain. Recommended topical Salonpas patches. Risks, benefits, complications of T9 and T6 kyphoplasty procedure have been discussed with patient and husband, patient has agreed and consented to procedure with Dr. Hessie Knows. Last INR was 2.8. Recommend holding Coumadin 6 days prior to kyphoplasty. This note was generated in part with voice recognition software and I apologize for any typographical errors that were not detected and corrected.  Feliberto Gottron MPA-C    Electronically signed by Feliberto Gottron, PA at 01/08/2020 12:10 PM EDT   Reviewed paper H+P, will be scanned into chart. No changes noted.

## 2020-01-22 LAB — SURGICAL PATHOLOGY

## 2020-01-28 ENCOUNTER — Encounter: Payer: Medicare Other | Attending: Internal Medicine | Admitting: Internal Medicine

## 2020-01-28 ENCOUNTER — Other Ambulatory Visit: Payer: Self-pay

## 2020-01-28 DIAGNOSIS — Z8249 Family history of ischemic heart disease and other diseases of the circulatory system: Secondary | ICD-10-CM | POA: Diagnosis not present

## 2020-01-28 DIAGNOSIS — L97812 Non-pressure chronic ulcer of other part of right lower leg with fat layer exposed: Secondary | ICD-10-CM | POA: Insufficient documentation

## 2020-01-28 DIAGNOSIS — Z823 Family history of stroke: Secondary | ICD-10-CM | POA: Insufficient documentation

## 2020-01-28 DIAGNOSIS — I872 Venous insufficiency (chronic) (peripheral): Secondary | ICD-10-CM | POA: Diagnosis not present

## 2020-01-28 DIAGNOSIS — Z86718 Personal history of other venous thrombosis and embolism: Secondary | ICD-10-CM | POA: Diagnosis not present

## 2020-01-28 DIAGNOSIS — I482 Chronic atrial fibrillation, unspecified: Secondary | ICD-10-CM | POA: Insufficient documentation

## 2020-01-28 DIAGNOSIS — F0280 Dementia in other diseases classified elsewhere without behavioral disturbance: Secondary | ICD-10-CM | POA: Diagnosis not present

## 2020-01-28 DIAGNOSIS — Z7901 Long term (current) use of anticoagulants: Secondary | ICD-10-CM | POA: Diagnosis not present

## 2020-01-28 DIAGNOSIS — G309 Alzheimer's disease, unspecified: Secondary | ICD-10-CM | POA: Diagnosis not present

## 2020-01-28 DIAGNOSIS — M1990 Unspecified osteoarthritis, unspecified site: Secondary | ICD-10-CM | POA: Insufficient documentation

## 2020-01-28 DIAGNOSIS — I10 Essential (primary) hypertension: Secondary | ICD-10-CM | POA: Insufficient documentation

## 2020-01-28 DIAGNOSIS — Z87891 Personal history of nicotine dependence: Secondary | ICD-10-CM | POA: Diagnosis not present

## 2020-01-29 NOTE — Progress Notes (Signed)
UMEKA, RAUBER ([id number]) Visit Report for 01/28/2020 Abuse/Suicide Risk Screen Details [name], [name]. Date of Service: 01/28/2020 1:00 PM Medical Record Number: [id number] Patient Account Number: [id number] Date of Birth/Sex: 10-Oct-1926 (86 y.o. F) Treating RN: Montey Hora Primary Care Demica Zook: Derinda Late Other Clinician: Referring Semisi Biela: Referral, Self Treating Kassidie Hendriks/Extender: Ricard Dillon Weeks in Treatment: 0 Abuse/Suicide Risk Screen Items Answer ABUSE RISK SCREEN: Has anyone close to you tried to hurt or harm you recentlyo No Do you feel uncomfortable with anyone in your familyo No Has anyone forced you do things that you didnot want to doo No Electronic Signature(s) Signed: 01/28/2020 4:17:41 PM By: Montey Hora Entered By: Montey Hora on 01/28/2020 13:05:33 Melendrez, [name] ([id number]) -------------------------------------------------------------------------------- Activities of Daily Living Details [name]. Date of Service: 01/28/2020 1:00 PM Medical Record Number: [id number] Patient Account Number: [id number] Date of Birth/Sex: May 11, 1926 (86 y.o. F) Treating RN: Montey Hora Primary Care Rise Traeger: Derinda Late Other Clinician: Referring Samrat Hayward: Referral, Self Treating Jodelle Fausto/Extender: Ricard Dillon Weeks in Treatment: 0 Activities of Daily Living Items Answer Activities of Daily Living (Please select one for each item) Drive Automobile Not Able Take Medications Completely Able Use Telephone Completely Able Care for Appearance Completely Able Use Toilet Completely Able Bath / Shower Completely Able Dress Self Completely Able Feed Self Completely Able Walk Completely Able Get In / Out Bed Completely Able Housework Completely Able Prepare Meals Completely [place] for Self Completely Able Electronic Signature(s) Signed: 01/28/2020 4:17:41 PM By: Montey Hora Entered By: Montey Hora on 01/28/2020 13:06:04 [name] ([id number]) -------------------------------------------------------------------------------- Education Screening Details [name]. Date of Service: 01/28/2020 1:00 PM Medical Record Number: [id number] Patient Account Number: [id number] Date of Birth/Sex: 12-09-1925 (86 y.o. F) Treating RN: Montey Hora Primary Care Timo Hartwig: Derinda Late Other Clinician: Referring Josephanthony Tindel: Referral, Self Treating Jazen Spraggins/Extender: Tito Dine in Treatment: 0 Primary Learner Assessed: Patient Learning Preferences/Education Level/Primary Language Learning Preference: Explanation, Demonstration Highest Education Level: College or Above Preferred Language: English Cognitive Barrier Language Barrier: No Translator Needed: No Memory Deficit: No Emotional Barrier: No Cultural/Religious Beliefs Affecting Medical Care: No Physical Barrier Impaired Vision: No Impaired Hearing: No Decreased Hand dexterity: No Knowledge/Comprehension Knowledge Level: Medium Comprehension Level: Medium Ability to understand written instructions: Medium Ability to understand verbal instructions: Medium Motivation Anxiety Level: Calm Cooperation: Cooperative Education Importance: Acknowledges Need Interest in Health Problems: Asks Questions Perception: Coherent Willingness to Engage in Self-Management Medium Activities: Readiness to Engage in Self-Management Medium Activities: Electronic Signature(s) Signed: 01/28/2020 4:17:41 PM By: Montey Hora Entered By: Montey Hora on 01/28/2020 13:06:42 [name] ([id number]) -------------------------------------------------------------------------------- Fall Risk Assessment Details [name]. Date of Service: 01/28/2020 1:00 PM Medical Record Number: [id number] Patient Account Number: [id number] Date of Birth/Sex: 09/05/1926 (86  y.o. F) Treating RN: Montey Hora Primary Care Braiden Rodman: Derinda Late Other Clinician: Referring Morio Widen: Referral, Self Treating Shantanique Hodo/Extender: Tito Dine in Treatment: 0 Fall Risk Assessment Items Have you had 2 or more falls in the last 12 monthso 0 No Have you had any fall that resulted in injury in the last 12 monthso 0 No FALLS RISK SCREEN History of falling - immediate or within 3 months 0 No Secondary diagnosis (Do you have 2 or more medical diagnoseso) 0 No Ambulatory aid None/bed rest/wheelchair/nurse 0 Yes Crutches/cane/walker 0 No Furniture 0 No Intravenous therapy Access/Saline/Heparin Lock 0 No Gait/Transferring Normal/ bed rest/ wheelchair 0 Yes Weak (short  steps with or without shuffle, stooped but able to lift head while walking, may seek 0 No support from furniture) Impaired (short steps with shuffle, may have difficulty arising from chair, head down, impaired 0 No balance) Mental Status Oriented to own ability 0 Yes Electronic Signature(s) Signed: 01/28/2020 4:17:41 PM By: Montey Hora Entered By: Montey Hora on 01/28/2020 13:07:03 Archila, [name] ([id number]) -------------------------------------------------------------------------------- Foot Assessment Details [name]. Date of Service: 01/28/2020 1:00 PM Medical Record Number: [id number] Patient Account Number: [id number] Date of Birth/Sex: 1925-11-16 (86 y.o. F) Treating RN: Montey Hora Primary Care Jeff Mccallum: Derinda Late Other Clinician: Referring Zaki Gertsch: Referral, Self Treating Laquinta Hazell/Extender: Ricard Dillon Weeks in Treatment: 0 Foot Assessment Items Site Locations + = Sensation present, - = Sensation absent, C = Callus, U = Ulcer R = Redness, W = Warmth, M = Maceration, PU = Pre-ulcerative lesion F = Fissure, S = Swelling, D = Dryness Assessment Right: Left: Other Deformity: No No Prior Foot Ulcer: No No Prior Amputation: No  No Charcot Joint: No No Ambulatory Status: Ambulatory Without Help Gait: Steady Electronic Signature(s) Signed: 01/28/2020 4:17:41 PM By: Montey Hora Entered By: Montey Hora on 01/28/2020 13:07:28 [name] ([id number]) -------------------------------------------------------------------------------- Nutrition Risk Screening Details [name]. Date of Service: 01/28/2020 1:00 PM Medical Record Number: [id number] Patient Account Number: [id number] Date of Birth/Sex: February 06, 1926 (86 y.o. F) Treating RN: Montey Hora Primary Care Blen Ransome: Derinda Late Other Clinician: Referring Nylene Inlow: Referral, Self Treating Samwise Eckardt/Extender: Ricard Dillon Weeks in Treatment: 0 Height (in): 62 Weight (lbs): 98 Body Mass Index (BMI): 17.9 Nutrition Risk Screening Items Score Screening NUTRITION RISK SCREEN: I have an illness or condition that made me change the kind and/or amount of food I eat 0 No I eat fewer than two meals per day 0 No I eat few fruits and vegetables, or milk products 0 No I have three or more drinks of beer, liquor or wine almost every day 0 No I have tooth or mouth problems that make it hard for me to eat 0 No I don't always have enough money to buy the food I need 0 No I eat alone most of the time 0 No I take three or more different prescribed or over-the-counter drugs a day 1 Yes Without wanting to, I have lost or gained 10 pounds in the last six months 0 No I am not always physically able to shop, cook and/or feed myself 0 No Nutrition Protocols Good Risk Protocol 0 No interventions needed Moderate Risk Protocol High Risk Proctocol Risk Level: Good Risk Score: 1 Electronic Signature(s) Signed: 01/28/2020 4:17:41 PM By: Montey Hora Entered By: Montey Hora on 01/28/2020 13:07:11

## 2020-01-30 NOTE — Progress Notes (Signed)
[name] ([id number]) Visit Report for 01/28/2020 Chief Complaint Document Details [name]. Date of Service: 01/28/2020 1:00 PM Medical Record Number: [id number] Patient Account Number: [id number] Date of Birth/Sex: Apr 21, 1926 (86 y.o. F) Treating RN: Cornell Barman Primary Care Provider: Derinda Late Other Clinician: Referring Provider: Referral, Self Treating Provider/Extender: Tito Dine in Treatment: 0 Information Obtained from: Patient Chief Complaint Left anterior shin ulcer 01/28/2020; patient is here for review of wound on her right anterior lower shin Electronic Signature(s) Signed: 01/28/2020 4:54:03 PM By: Linton Ham MD Entered By: Linton Ham on 01/28/2020 14:02:28 [name] ([id number]) -------------------------------------------------------------------------------- HPI Details [name]. Date of Service: 01/28/2020 1:00 PM Medical Record Number: [id number] Patient Account Number: [id number] Date of Birth/Sex: [birth year] (86 y.o. F) Treating RN: Cornell Barman Primary Care Provider: Derinda Late Other Clinician: Referring Provider: Referral, Self Treating Provider/Extender: Tito Dine in Treatment: 0 History of Present Illness HPI Description: 06/06/17 on evaluation today patient presents for evaluation concerning an ulcer which she initially had arise as a result of striking her left lower extremity money bed frame. With that being said this was roughly one month ago and unfortunately the wound despite two courses of Keflex have not really made a dramatic improvement. This has continued to drain as well as being exquisitely tender at times. Even to the point that she is having a difficult time walking. Patient does have chronic atrial fibrillation which subsequently leads to her being on long-term anticoagulant therapy and this causes ED bruising which may have contributed to this entry  and where things are at this point. She does have valvular heart disease as well as hypertension and her last INR was 2.7. Currently Neosporin, Gauls, and an ace wrap has been utilized. Keflex is the antibiotic that she has been on. Patient has not had a culture up to this point and she states that her blood pressure at the primary care office yesterday was 138/72. Her pain is significant related to be an eight out of 10. No fevers, chills, nausea, or vomiting noted at this time. 06/20/17; patient was admitted here 2 weeks ago. She had a traumatic wound on her left lateral lower leg probably in the setting of some degree of venous insufficiency with surrounding cellulitis. She had been on Keflex, after we saw her she was put on doxycycline which she apparently did not tolerate. Culture that was done at the time showed Morganella which was resistant to Keflex. Apparently the patient continued to take Keflex after the appointment. She is also on Coumadin. I had tried to change her antibiotic when the Morganella culture was brought to my attention however apparently our staff could not reach the patient to inform them of the antibiotic change 06/26/17; patient has wound on her left lateral lower extremity in the setting of some degree of venous insufficiency. I gave her cefdinir last week and she is completing this. There is no evidence of surrounding infection. Aggressive debridement last week, wound bed looks healthier. We've been using Aquacel. They're traveling in the [place] next week we'll see her back in 2 weeks unless there are problems. 07/10/17; the patient is been using Aquacel Ag her husband is changing this with Kerlix and conform. She still complains of a lot of pain. Apparently with the antibiotics. We gave 2 or 3 weeks ago. Her INR went up to 6, this was managed by her primary physician 07/17/17; she is using Aquacel Ag and a  border foam. Still discomfort although dimensions are  better. 07/24/17; patient or for review of a trauma wound on her left anterior leg in the setting of some degree of chronic venous insufficiency she has been using Aquacel Ag and a border foam and making progress. 07/31/17; only a small open area of this original significant trauma is still open. Her husband is changing this every second day [Aquacel Ag] Readmission: 01/14/19 on evaluation today patient is that she seen for initial inspection during the office visit today concerning a trauma/skin tear to the left anterior lower extremity. This is roughly the same region where she was previously treated and years past when she was here in the clinic. Nonetheless upon evaluation today the patient's wound actually showed signs of decent granulation around the edge although unfortunately the skin flap that was torn back did fold under on itself and the flap itself is dying. She does have some eschar surrounded the edges of the wound at this point. Nonetheless this also was very tender for her. The patient does have a history of hypertension, atrial fibrillation, and long-term use anticoagulant therapy. 01/21/19 on evaluation today patient's wound on the anterior portion of her lower extremity appears to still be necrotic as far as the surface of the wound is concerned. Unfortunately there is no significant improvement at this point compared to last week they were not able to get the Santyl that are using Medihoney and maybe one reason why. The other is I think this may be stained to drive. Possibly adding something such as mepitel over top of the Medihoney may help to loosen this up quite a bit which I think would be in her best interest. Also she still has a lot of erythema and is very tender to touch I'm afraid that we may need to switch to a different antibiotic to see if this will be of benefit there still really nothing that I can culture to get a better picture of what's going on and what we need to do  from the standpoint of antibiotics. I do believe the patient needs a referral Talmuds me to vascular in order to evaluate her blood flow based on what I'm seeing they will be able to perform TBI testing if nothing else along with the vascular evaluation to see if there's any evidence for limited blood flow to this lower extremity. 4/8; I have reviewed the patient's arterial studies and they are really quite good. There should be no arterial impediments to healing the wound on the right anterior tibial area. 4/15; patient has a small open area over the left mid tibial area. Still requiring debridement we are using Santyl here and Medihoney at home [Santyl unaffordable] 4/22; difficult, presumably traumatic area over the left mid tibial area in the setting of chronic venous insufficiency. We have been using Santyl here and meta honey at home. The wound surface is getting gradually better and slight improvement in dimensions. Her husband is changing this daily 4/29; left mid tibia presumably traumatic wound in the setting of chronic venous insufficiency. We have been using Santyl and a combination of Medihoney at home. Arrives today with a much better looking healthy granulated surface. We will change the primary dressing to [facility] 5/6; left mid tibia smaller reasonably healthy looking wound using Hydrofera Blue since last week 5/20-Patient returns to clinic with a left mid tibial wound looking better, we have been using Hydrofera Blue since the previous week 5/27; patient returns to clinic with  a left mid tibial wound covered in raised eschar. We have been using Hydrofera Blue her husband changes the dressings using a border foam cover READMISSION /7/21 Mrs. Lanasa is a patient who is now 86 years old. As usual she comes in with her husband who is her primary caregiver. They were here last from March through May 2020 with a traumatic wound on the left anterior tibia we eventually heal this  out. She has chronic venous insufficiency. She is also here previously in 2018. [name], [name] ([id number]) Her current problem started 8 days ago. She went in the hospital for a thoracic level kyphoplasty. The procedure she developed a skin tear on her leg. This was mentioned to her. Her husband is applying Medihoney to the area and they are in for our review of this. She finds this much too uncomfortable to attempt ABIs although her ABIs when she was in the clinic last year were quite normal bilaterally Past medical history she is not a diabetic. As noted she had a T9 and T6 kyphoplasty, Alzheimer's disease, chronic atrial fibrillation on Coumadin and hypertension Electronic Signature(s) Signed: 01/28/2020 4:54:03 PM By: Linton Ham MD Entered By: Linton Ham on 01/28/2020 14:04:44 Deutscher, [name] ([id number]) -------------------------------------------------------------------------------- Physical Exam Details [name]. Date of Service: 01/28/2020 1:00 PM Medical Record Number: [id number] Patient Account Number: [id number] Date of Birth/Sex: 1926-02-09 (86 y.o. F) Treating RN: Cornell Barman Primary Care Provider: Derinda Late Other Clinician: Referring Provider: Referral, Self Treating Provider/Extender: Ricard Dillon Weeks in Treatment: 0 Constitutional Patient is hypertensive.. Pulse regular and within target range for patient.Marland Kitchen Respirations regular, non-labored and within target range.. Temperature is normal and within the target range for the patient.Marland Kitchen appears in no distress. Respiratory Respiratory effort is easy and symmetric bilaterally. Rate is normal at rest and on room air.. Bilateral breath sounds are clear and equal in all lobes with no wheezes, rales or rhonchi.. Cardiovascular 3 out of 6 systolic ejection murmur no signs of congestive heart failure. Needle pulses are palpable bilaterally. Edema present right lower  extremity.. Integumentary (Hair, Skin) Hemosiderin deposition bilaterally very thin fragile skin in her lower extremities. Notes Wound exam; sizable skin tear in the right anterior lower extremity. Probably 60% of this covered with skin that is still intact although I am doubtful that this will remain viable. Debris washed off the crescent-shaped wound that is open. She has surrounding erythema however I think this is a result of the original contusion. I do not believe there is a sizable subdermal hematoma although there is some superficial bruising. I do not believe there is any infection Electronic Signature(s) Signed: 01/28/2020 4:54:03 PM By: Linton Ham MD Entered By: Linton Ham on 01/28/2020 14:06:47 Mohler, [name] ([id number]) -------------------------------------------------------------------------------- Physician Orders Details [name] Date of Service: 01/28/2020 1:00 PM Medical Record Number: [id number] Patient Account Number: [id number] Date of Birth/Sex: 03-04-1926 (86 y.o. F) Treating RN: Cornell Barman Primary Care Provider: Derinda Late Other Clinician: Referring Provider: Referral, Self Treating Provider/Extender: Tito Dine in Treatment: 0 Verbal / Phone Orders: No Diagnosis Coding Wound Cleansing Wound #3 Right,Lateral Lower Leg o Cleanse wound with mild soap and water Anesthetic (add to Medication List) Wound #3 Right,Lateral Lower Leg o Topical Lidocaine 4% cream applied to wound bed prior to debridement (In Clinic Only). Primary Wound Dressing Wound #3 Right,Lateral Lower Leg o Silver Alginate Secondary Dressing Wound #3 Right,Lateral Lower Leg o ABD pad Dressing Change Frequency Wound #3 Right,Lateral  Lower Leg o Change dressing every week Follow-up Appointments Wound #3 Right,Lateral Lower Leg o Return Appointment in 1 week. Edema Control Wound #3 Right,Lateral Lower Leg o Kerlix and Coban -  Right Lower Extremity Additional Orders / Instructions Wound #3 Right,Lateral Lower Leg o Increase protein intake. o Activity as tolerated Electronic Signature(s) Signed: 01/28/2020 4:54:03 PM By: Linton Ham MD Signed: 01/30/2020 10:29:18 AM By: Gretta Cool, BSN, RN, [name], Kim RN, BSN Entered By: Gretta Cool, BSN, RN, [name], Kim on 01/28/2020 13:28:54 [name] ([id number]) -------------------------------------------------------------------------------- Problem List Details [name], [name]. Date of Service: 01/28/2020 1:00 PM Medical Record Number: [id number] Patient Account Number: [id number] Date of Birth/Sex: [birth year] (86 y.o. F) Treating RN: Cornell Barman Primary Care Provider: Derinda Late Other Clinician: Referring Provider: Referral, Self Treating Provider/Extender: Tito Dine in Treatment: 0 Active Problems ICD-10 Evaluated Encounter Code Description Active Date Today Diagnosis S81.811D Laceration without foreign body, right lower leg, subsequent encounter 01/28/2020 No Yes L97.812 Non-pressure chronic ulcer of other part of right lower leg with fat layer 01/28/2020 No Yes exposed I87.321 Chronic venous hypertension (idiopathic) with inflammation of right lower 01/28/2020 No Yes extremity Inactive Problems Resolved Problems Electronic Signature(s) Signed: 01/28/2020 4:54:03 PM By: Linton Ham MD Entered By: Linton Ham on 01/28/2020 13:35:43 Friedlander, [name] ([id number]) -------------------------------------------------------------------------------- Progress Note Details [name]. Date of Service: 01/28/2020 1:00 PM Medical Record Number: [id number] Patient Account Number: [id number] Date of Birth/Sex: 09-05-1926 (86 y.o. F) Treating RN: Cornell Barman Primary Care Provider: Derinda Late Other Clinician: Referring Provider: Referral, Self Treating Provider/Extender: Tito Dine in Treatment:  0 Subjective Chief Complaint Information obtained from Patient Left anterior shin ulcer 01/28/2020; patient is here for review of wound on her right anterior lower shin History of Present Illness (HPI) 06/06/17 on evaluation today patient presents for evaluation concerning an ulcer which she initially had arise as a result of striking her left lower extremity money bed frame. With that being said this was roughly one month ago and unfortunately the wound despite two courses of Keflex have not really made a dramatic improvement. This has continued to drain as well as being exquisitely tender at times. Even to the point that she is having a difficult time walking. Patient does have chronic atrial fibrillation which subsequently leads to her being on long-term anticoagulant therapy and this causes ED bruising which may have contributed to this entry and where things are at this point. She does have valvular heart disease as well as hypertension and her last INR was 2.7. Currently Neosporin, Gauls, and an ace wrap has been utilized. Keflex is the antibiotic that she has been on. Patient has not had a culture up to this point and she states that her blood pressure at the primary care office yesterday was 138/72. Her pain is significant related to be an eight out of 10. No fevers, chills, nausea, or vomiting noted at this time. 06/20/17; patient was admitted here 2 weeks ago. She had a traumatic wound on her left lateral lower leg probably in the setting of some degree of venous insufficiency with surrounding cellulitis. She had been on Keflex, after we saw her she was put on doxycycline which she apparently did not tolerate. Culture that was done at the time showed Morganella which was resistant to Keflex. Apparently the patient continued to take Keflex after the appointment. She is also on Coumadin. I had tried to change her antibiotic when the Morganella culture was brought to  my attention  however apparently our staff could not reach the patient to inform them of the antibiotic change 06/26/17; patient has wound on her left lateral lower extremity in the setting of some degree of venous insufficiency. I gave her cefdinir last week and she is completing this. There is no evidence of surrounding infection. Aggressive debridement last week, wound bed looks healthier. We've been using Aquacel. They're traveling in the [place] next week we'll see her back in 2 weeks unless there are problems. 07/10/17; the patient is been using Aquacel Ag her husband is changing this with Kerlix and conform. She still complains of a lot of pain. Apparently with the antibiotics. We gave 2 or 3 weeks ago. Her INR went up to 6, this was managed by her primary physician 07/17/17; she is using Aquacel Ag and a border foam. Still discomfort although dimensions are better. 07/24/17; patient or for review of a trauma wound on her left anterior leg in the setting of some degree of chronic venous insufficiency she has been using Aquacel Ag and a border foam and making progress. 07/31/17; only a small open area of this original significant trauma is still open. Her husband is changing this every second day [Aquacel Ag] Readmission: 01/14/19 on evaluation today patient is that she seen for initial inspection during the office visit today concerning a trauma/skin tear to the left anterior lower extremity. This is roughly the same region where she was previously treated and years past when she was here in the clinic. Nonetheless upon evaluation today the patient's wound actually showed signs of decent granulation around the edge although unfortunately the skin flap that was torn back did fold under on itself and the flap itself is dying. She does have some eschar surrounded the edges of the wound at this point. Nonetheless this also was very tender for her. The patient does have a history of hypertension, atrial fibrillation,  and long-term use anticoagulant therapy. 01/21/19 on evaluation today patient's wound on the anterior portion of her lower extremity appears to still be necrotic as far as the surface of the wound is concerned. Unfortunately there is no significant improvement at this point compared to last week they were not able to get the Santyl that are using Medihoney and maybe one reason why. The other is I think this may be stained to drive. Possibly adding something such as mepitel over top of the Medihoney may help to loosen this up quite a bit which I think would be in her best interest. Also she still has a lot of erythema and is very tender to touch I'm afraid that we may need to switch to a different antibiotic to see if this will be of benefit there still really nothing that I can culture to get a better picture of what's going on and what we need to do from the standpoint of antibiotics. I do believe the patient needs a referral Talmuds me to vascular in order to evaluate her blood flow based on what I'm seeing they will be able to perform TBI testing if nothing else along with the vascular evaluation to see if there's any evidence for limited blood flow to this lower extremity. 4/8; I have reviewed the patient's arterial studies and they are really quite good. There should be no arterial impediments to healing the wound on the right anterior tibial area. 4/15; patient has a small open area over the left mid tibial area. Still requiring debridement we are using Santyl  here and Medihoney at home [Santyl unaffordable] 4/22; difficult, presumably traumatic area over the left mid tibial area in the setting of chronic venous insufficiency. We have been using Santyl here and meta honey at home. The wound surface is getting gradually better and slight improvement in dimensions. Her husband is changing this daily 4/29; left mid tibia presumably traumatic wound in the setting of chronic venous insufficiency. We  have been using Santyl and a combination of Medihoney at home. Arrives today with a much better looking healthy granulated surface. We will change the primary dressing to [facility] 5/6; left mid tibia smaller reasonably healthy looking wound using Hydrofera Blue since last week 5/20-Patient returns to clinic with a left mid tibial wound looking better, we have been using Hydrofera Blue since the previous week 5/27; patient returns to clinic with a left mid tibial wound covered in raised eschar. We have been using Hydrofera Blue her husband changes the dressings using a border foam cover AANIA, BENNETTE ([id number]) /7/21 Mrs. Deyoung is a patient who is now 86 years old. As usual she comes in with her husband who is her primary caregiver. They were here last from March through May 2020 with a traumatic wound on the left anterior tibia we eventually heal this out. She has chronic venous insufficiency. She is also here previously in 2018. Her current problem started 8 days ago. She went in the hospital for a thoracic level kyphoplasty. The procedure she developed a skin tear on her leg. This was mentioned to her. Her husband is applying Medihoney to the area and they are in for our review of this. She finds this much too uncomfortable to attempt ABIs although her ABIs when she was in the clinic last year were quite normal bilaterally Past medical history she is not a diabetic. As noted she had a T9 and T6 kyphoplasty, Alzheimer's disease, chronic atrial fibrillation on Coumadin and hypertension Patient History Information obtained from Patient. Allergies No Known Drug Allergies Family History Cancer - Siblings, Hypertension - Father,Siblings, Stroke - Father, No family history of Diabetes, Heart Disease, Kidney Disease, Lung Disease, Seizures, Thyroid Problems, Tuberculosis. Social History Former smoker, Marital Status - Married, Alcohol Use - Daily, Drug Use - No History,  Caffeine Use - Never. Medical History Eyes Patient has history of Cataracts - removed Denies history of Glaucoma, Optic Neuritis Ear/Nose/Mouth/Throat Denies history of Chronic sinus problems/congestion Hematologic/Lymphatic Denies history of Anemia, Hemophilia, Human Immunodeficiency Virus, Lymphedema, Sickle Cell Disease Respiratory Denies history of Aspiration, Asthma, Chronic Obstructive Pulmonary Disease (COPD), Pneumothorax, Sleep Apnea, Tuberculosis Cardiovascular Patient has history of Deep Vein Thrombosis, Hypertension Denies history of Angina, Arrhythmia, Congestive Heart Failure, Coronary Artery Disease, Hypotension, Myocardial Infarction, Peripheral Arterial Disease, Peripheral Venous Disease, Phlebitis, Vasculitis Gastrointestinal Denies history of Cirrhosis , Colitis, Crohn s, Hepatitis A, Hepatitis B, Hepatitis C Endocrine Denies history of Type I Diabetes, Type II Diabetes Genitourinary Denies history of End Stage Renal Disease Immunological Denies history of Lupus Erythematosus, Raynaud s, Scleroderma Integumentary (Skin) Denies history of History of Burn, History of pressure wounds Musculoskeletal Patient has history of Osteoarthritis - hands Denies history of Gout, Rheumatoid Arthritis, Osteomyelitis Neurologic Patient has history of Dementia Denies history of Neuropathy, Quadriplegia, Paraplegia, Seizure Disorder Oncologic Denies history of Received Chemotherapy, Received Radiation Medical And Surgical History Notes Constitutional Symptoms (General Health) A-Fib; HTN; Valvular heart disease Review of Systems (ROS) Eyes Denies complaints or symptoms of Dry Eyes, Vision Changes, Glasses / Contacts. Ear/Nose/Mouth/Throat Denies complaints or symptoms  of Difficult clearing ears, Sinusitis. Hematologic/Lymphatic Denies complaints or symptoms of Bleeding / Clotting Disorders, Human Immunodeficiency Virus. [name], [name] ([id number]) Respiratory Denies  complaints or symptoms of Chronic or frequent coughs, Shortness of Breath. Cardiovascular Complains or has symptoms of LE edema. Denies complaints or symptoms of Chest pain. Gastrointestinal Denies complaints or symptoms of Frequent diarrhea, Nausea, Vomiting. Endocrine Denies complaints or symptoms of Hepatitis, Thyroid disease, Polydypsia (Excessive Thirst). Genitourinary Denies complaints or symptoms of Kidney failure/ Dialysis, Incontinence/dribbling. Immunological Denies complaints or symptoms of Hives, Itching. Integumentary (Skin) Complains or has symptoms of Wounds. Denies complaints or symptoms of Bleeding or bruising tendency, Breakdown, Swelling. Musculoskeletal Denies complaints or symptoms of Muscle Pain, Muscle Weakness. Neurologic Denies complaints or symptoms of Numbness/parasthesias, Focal/Weakness. Psychiatric Denies complaints or symptoms of Anxiety, Claustrophobia. Objective Constitutional Patient is hypertensive.. Pulse regular and within target range for patient.Marland Kitchen Respirations regular, non-labored and within target range.. Temperature is normal and within the target range for the patient.Marland Kitchen appears in no distress. Vitals Time Taken: 12:55 PM, Height: 62 in, Source: Stated, Weight: 98 lbs, Source: Stated, BMI: 17.9, Temperature: 97.9 F, Pulse: 88 bpm, Respiratory Rate: 18 breaths/min, Blood Pressure: 168/100 mmHg. Respiratory Respiratory effort is easy and symmetric bilaterally. Rate is normal at rest and on room air.. Bilateral breath sounds are clear and equal in all lobes with no wheezes, rales or rhonchi.. Cardiovascular 3 out of 6 systolic ejection murmur no signs of congestive heart failure. Needle pulses are palpable bilaterally. Edema present right lower extremity.. General Notes: Wound exam; sizable skin tear in the right anterior lower extremity. Probably 60% of this covered with skin that is still intact although I am doubtful that this will remain  viable. Debris washed off the crescent-shaped wound that is open. She has surrounding erythema however I think this is a result of the original contusion. I do not believe there is a sizable subdermal hematoma although there is some superficial bruising. I do not believe there is any infection Integumentary (Hair, Skin) Hemosiderin deposition bilaterally very thin fragile skin in her lower extremities. Wound #3 status is Open. Original cause of wound was Trauma. The wound is located on the Right,Lateral Lower Leg. The wound measures 4cm length x 4.5cm width x 0.2cm depth; 14.137cm^2 area and 2.827cm^3 volume. There is Fat Layer (Subcutaneous Tissue) Exposed exposed. There is no tunneling or undermining noted. There is a medium amount of sanguinous drainage noted. The wound margin is flat and intact. There is medium (34-66%) pink granulation within the wound bed. There is a medium (34-66%) amount of necrotic tissue within the wound bed including Adherent Slough. Assessment Active Problems [name], [name] ([id number]) ICD-10 Laceration without foreign body, right lower leg, subsequent encounter Non-pressure chronic ulcer of other part of right lower leg with fat layer exposed Chronic venous hypertension (idiopathic) with inflammation of right lower extremity Plan Wound Cleansing: Wound #3 Right,Lateral Lower Leg: Cleanse wound with mild soap and water Anesthetic (add to Medication List): Wound #3 Right,Lateral Lower Leg: Topical Lidocaine 4% cream applied to wound bed prior to debridement (In Clinic Only). Primary Wound Dressing: Wound #3 Right,Lateral Lower Leg: Silver Alginate Secondary Dressing: Wound #3 Right,Lateral Lower Leg: ABD pad Dressing Change Frequency: Wound #3 Right,Lateral Lower Leg: Change dressing every week Follow-up Appointments: Wound #3 Right,Lateral Lower Leg: Return Appointment in 1 week. Edema Control: Wound #3 Right,Lateral Lower Leg: Kerlix and Coban -  Right Lower Extremity Additional Orders / Instructions: Wound #3 Right,Lateral Lower Leg: Increase protein intake. Activity as  tolerated 1. I put silver alginate ABD and I put her in a kerlix Coban compression. I did not think she could handle tighter compression 2. Check on the viability of the 60% of this that there is still epithelialized. I have my doubts that this will remain viable and I suspect I will have to remove the skin but we will see next week. 3. Hopefully 2 layer compression will control the localized swelling in the area 4. I am going to leave the dressing on all week Electronic Signature(s) Signed: 01/28/2020 4:54:03 PM By: Linton Ham MD Entered By: Linton Ham on 01/28/2020 14:08:19 [name] ([id number]) -------------------------------------------------------------------------------- ROS/PFSH Details [name]. Date of Service: 01/28/2020 1:00 PM Medical Record Number: [id number] Patient Account Number: [id number] Date of Birth/Sex: Mar 04, 1926 (86 y.o. F) Treating RN: Montey Hora Primary Care Provider: Derinda Late Other Clinician: Referring Provider: Referral, Self Treating Provider/Extender: Ricard Dillon Weeks in Treatment: 0 Information Obtained From Patient Eyes Complaints and Symptoms: Negative for: Dry Eyes; Vision Changes; Glasses / Contacts Medical History: Positive for: Cataracts - removed Negative for: Glaucoma; Optic Neuritis Ear/Nose/Mouth/Throat Complaints and Symptoms: Negative for: Difficult clearing ears; Sinusitis Medical History: Negative for: Chronic sinus problems/congestion Hematologic/Lymphatic Complaints and Symptoms: Negative for: Bleeding / Clotting Disorders; Human Immunodeficiency Virus Medical History: Negative for: Anemia; Hemophilia; Human Immunodeficiency Virus; Lymphedema; Sickle Cell Disease Respiratory Complaints and Symptoms: Negative for: Chronic or frequent coughs;  Shortness of Breath Medical History: Negative for: Aspiration; Asthma; Chronic Obstructive Pulmonary Disease (COPD); Pneumothorax; Sleep Apnea; Tuberculosis Cardiovascular Complaints and Symptoms: Positive for: LE edema Negative for: Chest pain Medical History: Positive for: Deep Vein Thrombosis; Hypertension Negative for: Angina; Arrhythmia; Congestive Heart Failure; Coronary Artery Disease; Hypotension; Myocardial Infarction; Peripheral Arterial Disease; Peripheral Venous Disease; Phlebitis; Vasculitis Gastrointestinal Complaints and Symptoms: Negative for: Frequent diarrhea; Nausea; Vomiting Medical History: Negative for: Cirrhosis ; Colitis; Crohnos; Hepatitis A; Hepatitis B; Hepatitis C Endocrine Complaints and Symptoms: Negative for: Hepatitis; Thyroid disease; Polydypsia (Excessive Thirst) Medical HistoryCHRYSTINA, [name] ([id number]) Negative for: Type I Diabetes; Type II Diabetes Genitourinary Complaints and Symptoms: Negative for: Kidney failure/ Dialysis; Incontinence/dribbling Medical History: Negative for: End Stage Renal Disease Immunological Complaints and Symptoms: Negative for: Hives; Itching Medical History: Negative for: Lupus Erythematosus; Raynaudos; Scleroderma Integumentary (Skin) Complaints and Symptoms: Positive for: Wounds Negative for: Bleeding or bruising tendency; Breakdown; Swelling Medical History: Negative for: History of Burn; History of pressure wounds Musculoskeletal Complaints and Symptoms: Negative for: Muscle Pain; Muscle Weakness Medical History: Positive for: Osteoarthritis - hands Negative for: Gout; Rheumatoid Arthritis; Osteomyelitis Neurologic Complaints and Symptoms: Negative for: Numbness/parasthesias; Focal/Weakness Medical History: Positive for: Dementia Negative for: Neuropathy; Quadriplegia; Paraplegia; Seizure Disorder Psychiatric Complaints and Symptoms: Negative for: Anxiety; Claustrophobia Constitutional  Symptoms (General Health) Medical History: Past Medical History Notes: A-Fib; HTN; Valvular heart disease Oncologic Medical History: Negative for: Received Chemotherapy; Received Radiation HBO Extended History Items Eyes: Cataracts Immunizations Pneumococcal Vaccine: Received Pneumococcal Vaccination: Yes [name], [name] ([id number]) Immunization Notes: up to date Implantable Devices None Family and Social History Cancer: Yes - Siblings; Diabetes: No; Heart Disease: No; Hypertension: Yes - Father,Siblings; Kidney Disease: No; Lung Disease: No; Seizures: No; Stroke: Yes - Father; Thyroid Problems: No; Tuberculosis: No; Former smoker; Marital Status - Married; Alcohol Use: Daily; Drug Use: No History; Caffeine Use: Never; Financial Concerns: No; Food, Clothing or Shelter Needs: No; Support System Lacking: No; Transportation Concerns: No Electronic Signature(s) Signed: 01/28/2020 4:17:41 PM By: Montey Hora Signed: 01/28/2020 4:54:03 PM By: Linton Ham MD  Entered By: Montey Hora on 01/28/2020 13:07:48 [name] ([id number]) -------------------------------------------------------------------------------- SuperBill Details [name] Date of Service: 01/28/2020 Medical Record Number: [id number] Patient Account Number: [id number] Date of Birth/Sex: [birth year] (86 y.o. F) Treating RN: Cornell Barman Primary Care Provider: Derinda Late Other Clinician: Referring Provider: Referral, Self Treating Provider/Extender: Ricard Dillon Weeks in Treatment: 0 Diagnosis Coding ICD-10 Codes Code Description S81.811D Laceration without foreign body, right lower leg, subsequent encounter L97.812 Non-pressure chronic ulcer of other part of right lower leg with fat layer exposed I87.321 Chronic venous hypertension (idiopathic) with inflammation of right lower extremity Facility Procedures CPT4 Code: [id number] Description: 99214 - WOUND CARE VISIT-LEV 4 EST  PT Modifier: Quantity: 1 Physician Procedures CPT4 Code: [id number] Description: 99214 - WC PHYS LEVEL 4 - EST PT Modifier: Quantity: 1 CPT4 Code: Description: ICD-10 Diagnosis Description S81.811D Laceration without foreign body, right lower leg, subsequent encounter [id number] Non-pressure chronic ulcer of other part of right lower leg with fat layer I87.321 Chronic venous hypertension (idiopathic)  with inflammation of right lower Modifier: exposed extremity Quantity: Electronic Signature(s) Signed: 01/28/2020 4:54:03 PM By: Linton Ham MD Entered By: Linton Ham on 01/28/2020 14:08:56

## 2020-01-30 NOTE — Progress Notes (Signed)
[name], [name] ([id number]) Visit Report for 01/28/2020 Allergy List Details [name]. Date of Service: 01/28/2020 1:00 PM Medical Record Number: [id number] Patient Account Number: [id number] Date of Birth/Sex: 1925-11-09 (86 y.o. F) Treating RN: Montey Hora Primary Care Breana Litts: Derinda Late Other Clinician: Referring Hilma Steinhilber: Referral, Self Treating Wahneta Derocher/Extender: Ricard Dillon Weeks in Treatment: 0 Allergies Active Allergies No Known Drug Allergies Allergy Notes Electronic Signature(s) Signed: 01/28/2020 4:17:41 PM By: Montey Hora Entered By: Montey Hora on 01/28/2020 13:04:29 [name] ([id number]) -------------------------------------------------------------------------------- Arrival Information Details [name]. Date of Service: 01/28/2020 1:00 PM Medical Record Number: [id number] Patient Account Number: [id number] Date of Birth/Sex: 30-Aug-1926 (86 y.o. F) Treating RN: Cornell Barman Primary Care Isa Kohlenberg: Derinda Late Other Clinician: Referring Boykin Baetz: Referral, Self Treating Claribel Sachs/Extender: Tito Dine in Treatment: 0 Visit Information Patient Arrived: Ambulatory Arrival Time: 12:55 Accompanied By: husband Transfer Assistance: None Patient Identification Verified: Yes Secondary Verification Process Completed: Yes Patient Has Alerts: Yes Patient Alerts: Patient on Blood Thinner warfarin History Since Last Visit Added or deleted any medications: No Any new allergies or adverse reactions: No Had a fall or experienced change in activities of daily living that may affect risk of falls: No Signs or symptoms of abuse/neglect since last visito No Hospitalized since last visit: No Implantable device outside of the clinic excluding cellular tissue based products placed in the center since last visit: No Electronic Signature(s) Signed: 01/28/2020 4:17:41 PM By: Montey Hora Entered By:  Montey Hora on 01/28/2020 13:08:06 [name], [name] ([id number]) -------------------------------------------------------------------------------- Clinic Level of Care Assessment Details [name] Date of Service: 01/28/2020 1:00 PM Medical Record Number: [id number] Patient Account Number: [id number] Date of Birth/Sex: [birth year] (86 y.o. F) Treating RN: Cornell Barman Primary Care Rhia Blatchford: Derinda Late Other Clinician: Referring Kilan Banfill: Referral, Self Treating Orlander Norwood/Extender: Tito Dine in Treatment: 0 Clinic Level of Care Assessment Items TOOL 2 Quantity Score []  - Use when only an EandM is performed on the INITIAL visit 0 ASSESSMENTS - Nursing Assessment / Reassessment X - General Physical Exam (combine w/ comprehensive assessment (listed just below) when performed on new pt. 1 20 evals) X- 1 25 Comprehensive Assessment (HX, ROS, Risk Assessments, Wounds Hx, etc.) ASSESSMENTS - Wound and Skin Assessment / Reassessment X - Simple Wound Assessment / Reassessment - one wound 1 5 []  - 0 Complex Wound Assessment / Reassessment - multiple wounds []  - 0 Dermatologic / Skin Assessment (not related to wound area) ASSESSMENTS - Ostomy and/or Continence Assessment and Care []  - Incontinence Assessment and Management 0 []  - 0 Ostomy Care Assessment and Management (repouching, etc.) PROCESS - Coordination of Care X - Simple Patient / Family Education for ongoing care 1 15 []  - 0 Complex (extensive) Patient / Family Education for ongoing care []  - 0 Staff obtains Programmer, systems, Records, Test Results / Process Orders []  - 0 Staff telephones HHA, Nursing Homes / Clarify orders / etc []  - 0 Routine Transfer to another Facility (non-emergent condition) []  - 0 Routine Hospital Admission (non-emergent condition) X- 1 15 New Admissions / Biomedical engineer / Ordering NPWT, Apligraf, etc. []  - 0 Emergency Hospital Admission (emergent condition) X-  1 10 Simple Discharge Coordination []  - 0 Complex (extensive) Discharge Coordination PROCESS - Special Needs []  - Pediatric / Minor Patient Management 0 []  - 0 Isolation Patient Management []  - 0 Hearing / Language / Visual special needs []  - 0 Assessment of Community assistance (transportation, D/C planning, etc.) []  -  0 Additional assistance / Altered mentation []  - 0 Support Surface(s) Assessment (bed, cushion, seat, etc.) INTERVENTIONS - Wound Cleansing / Measurement X - Wound Imaging (photographs - any number of wounds) 1 5 Mclucas, Iyauna C. ([id number]) []  - 0 Wound Tracing (instead of photographs) X- 1 5 Simple Wound Measurement - one wound []  - 0 Complex Wound Measurement - multiple wounds X- 1 5 Simple Wound Cleansing - one wound []  - 0 Complex Wound Cleansing - multiple wounds INTERVENTIONS - Wound Dressings []  - Small Wound Dressing one or multiple wounds 0 []  - 0 Medium Wound Dressing one or multiple wounds X- 1 20 Large Wound Dressing one or multiple wounds []  - 0 Application of Medications - injection INTERVENTIONS - Miscellaneous []  - External ear exam 0 []  - 0 Specimen Collection (cultures, biopsies, blood, body fluids, etc.) []  - 0 Specimen(s) / Culture(s) sent or taken to Lab for analysis []  - 0 Patient Transfer (multiple staff / Civil Service fast streamer / Similar devices) []  - 0 Simple Staple / Suture removal (25 or less) []  - 0 Complex Staple / Suture removal (26 or more) []  - 0 Hypo / Hyperglycemic Management (close monitor of Blood Glucose) []  - 0 Ankle / Brachial Index (ABI) - do not check if billed separately Has the patient been seen at the hospital within the last three years: Yes Total Score: 125 Level Of Care: New/Established - Level 4 Electronic Signature(s) Signed: 01/30/2020 10:29:18 AM By: Gretta Cool, BSN, RN, [name], Kim RN, BSN Entered By: Gretta Cool, BSN, RN, [name], Kim on 01/28/2020 13:29:58 [name]  ([id number]) -------------------------------------------------------------------------------- Encounter Discharge Information Details [name]. Date of Service: 01/28/2020 1:00 PM Medical Record Number: [id number] Patient Account Number: [id number] Date of Birth/Sex: 1925/11/12 (86 y.o. F) Treating RN: Cornell Barman Primary Care Theadore Blunck: Derinda Late Other Clinician: Referring Bryceson Grape: Referral, Self Treating Ezriel Boffa/Extender: Tito Dine in Treatment: 0 Encounter Discharge Information Items Discharge Condition: Stable Ambulatory Status: Ambulatory Discharge Destination: Home Transportation: Private Auto Accompanied By: husband Schedule Follow-up Appointment: Yes Clinical Summary of Care: Electronic Signature(s) Signed: 01/30/2020 10:29:18 AM By: Gretta Cool, BSN, RN, [name], Kim RN, BSN Entered By: Gretta Cool, BSN, RN, [name], Kim on 01/28/2020 13:32:35 [name] ([id number]) -------------------------------------------------------------------------------- Lower Extremity Assessment Details [name], [name]. Date of Service: 01/28/2020 1:00 PM Medical Record Number: [id number] Patient Account Number: [id number] Date of Birth/Sex: 02-14-1926 (86 y.o. F) Treating RN: Montey Hora Primary Care Takiera Mayo: Derinda Late Other Clinician: Referring Inara Dike: Referral, Self Treating Avyukt Cimo/Extender: Ricard Dillon Weeks in Treatment: 0 Edema Assessment Assessed: [Left: No] [Right: No] Edema: [Left: Ye] [Right: s] Calf Left: Right: Point of Measurement: 30 cm From Medial Instep cm 31 cm Ankle Left: Right: Point of Measurement: 10 cm From Medial Instep cm 20.5 cm Vascular Assessment Pulses: Dorsalis Pedis Palpable: [Right:Yes] Posterior Tibial Palpable: [Right:Yes] Notes ABI not obtained today r/t pain but ABI performed at [facility] on 01/23/19 L .93 and R .94 with TBI L .81 and R .95 Electronic Signature(s) Signed: 01/28/2020 4:17:41 PM By:  Montey Hora Entered By: Montey Hora on 01/28/2020 13:17:01 [name], [name] ([id number]) -------------------------------------------------------------------------------- Multi Wound Chart Details [name]. Date of Service: 01/28/2020 1:00 PM Medical Record Number: [id number] Patient Account Number: [id number] Date of Birth/Sex: [birth year] (86 y.o. F) Treating RN: Cornell Barman Primary Care Lexie Koehl: Derinda Late Other Clinician: Referring Jacinto Keil: Referral, Self Treating Jaydien Panepinto/Extender: Ricard Dillon Weeks in Treatment: 0 Vital Signs Height(in): 62 Pulse(bpm): 88 Weight(lbs): 98 Blood Pressure(mmHg):  168/100 Body Mass Index(BMI): 18 Temperature(F): 97.9 Respiratory Rate(breaths/min): 18 Photos: [N/A:N/A] Wound Location: Right, Lateral Lower Leg N/A N/A Wounding Event: Trauma N/A N/A Primary Etiology: Venous Leg Ulcer N/A N/A Secondary Etiology: Trauma, Other N/A N/A Comorbid History: Cataracts, Deep Vein Thrombosis, N/A N/A Hypertension, Osteoarthritis, Dementia Date Acquired: 01/20/2020 N/A N/A Weeks of Treatment: 0 N/A N/A Wound Status: Open N/A N/A Measurements L x W x D (cm) 4x4.5x0.2 N/A N/A Area (cm) : 14.137 N/A N/A Volume (cm) : 2.827 N/A N/A Classification: Full Thickness Without Exposed N/A N/A Support Structures Exudate Amount: Medium N/A N/A Exudate Type: Sanguinous N/A N/A Exudate Color: red N/A N/A Wound Margin: Flat and Intact N/A N/A Granulation Amount: Medium (34-66%) N/A N/A Granulation Quality: Pink N/A N/A Necrotic Amount: Medium (34-66%) N/A N/A Exposed Structures: Fat Layer (Subcutaneous Tissue) N/A N/A Exposed: Yes Fascia: No Tendon: No Muscle: No Joint: No Bone: No Epithelialization: None N/A N/A Treatment Notes Electronic Signature(s) Signed: 01/30/2020 10:29:18 AM By: Gretta Cool, BSN, RN, [name], Kim RN, BSN Entered By: Gretta Cool, BSN, RN, [name], Kim on 01/28/2020 13:27:14 [name], [name] ([id number]) [name], [name] ([id number]) -------------------------------------------------------------------------------- Multi-Disciplinary Care Plan Details [name], [name]. Date of Service: 01/28/2020 1:00 PM Medical Record Number: [id number] Patient Account Number: [id number] Date of Birth/Sex: 1926/06/28 (86 y.o. F) Treating RN: Cornell Barman Primary Care Tarena Gockley: Derinda Late Other Clinician: Referring Caulin Begley: Referral, Self Treating Elgie Landino/Extender: Tito Dine in Treatment: 0 Active Inactive Necrotic Tissue Nursing Diagnoses: Impaired tissue integrity related to necrotic/devitalized tissue Goals: Necrotic/devitalized tissue will be minimized in the wound bed Date Initiated: 01/28/2020 Target Resolution Date: 02/04/2020 Goal Status: Active Interventions: Assess patient pain level pre-, during and post procedure and prior to discharge Treatment Activities: Apply topical anesthetic as ordered : 01/28/2020 Notes: Orientation to the Wound Care Program Nursing Diagnoses: Knowledge deficit related to the wound healing center program Goals: Patient/caregiver will verbalize understanding of the [place] Date Initiated: 01/28/2020 Target Resolution Date: 02/04/2020 Goal Status: Active Interventions: Provide education on orientation to the wound center Notes: Soft Tissue Infection Nursing Diagnoses: Impaired tissue integrity Goals: Patient will remain free of wound infection Date Initiated: 01/28/2020 Target Resolution Date: 02/11/2020 Goal Status: Active Interventions: Assess signs and symptoms of infection every visit Notes: Wound/Skin Impairment [name], [name] ([id number]) Nursing Diagnoses: Impaired tissue integrity Goals: Ulcer/skin breakdown will have a volume reduction of 30% by week 4 Date Initiated: 01/28/2020 Target Resolution Date: 02/27/2020 Goal Status: Active Interventions: Assess ulceration(s) every visit Treatment  Activities: Skin care regimen initiated : 01/28/2020 Topical wound management initiated : 01/28/2020 Notes: Electronic Signature(s) Signed: 01/30/2020 10:29:18 AM By: Gretta Cool, BSN, RN, [name], Kim RN, BSN Entered By: Gretta Cool, BSN, RN, [name], Kim on 01/28/2020 13:26:55 [name] ([id number]) -------------------------------------------------------------------------------- Pain Assessment Details [name]. Date of Service: 01/28/2020 1:00 PM Medical Record Number: [id number] Patient Account Number: [id number] Date of Birth/Sex: 1926-07-17 (86 y.o. F) Treating RN: Cornell Barman Primary Care Burrell Hodapp: Derinda Late Other Clinician: Referring Brandalyn Harting: Referral, Self Treating Sueo Cullen/Extender: Ricard Dillon Weeks in Treatment: 0 Active Problems Location of Pain Severity and Description of Pain Patient Has Paino Yes Site Locations Rate the pain. Current Pain Level: 8 Pain Management and Medication Current Pain Management: Electronic Signature(s) Signed: 01/28/2020 3:46:59 PM By: Lorine Bears RCP, RRT, CHT Signed: 01/30/2020 10:29:18 AM By: Gretta Cool, BSN, RN, [name], Kim RN, BSN Entered By: Lorine Bears on 01/28/2020 12:56:46 [name] ([id number]) -------------------------------------------------------------------------------- Patient/Caregiver Education Details Patient Name:  [name], [name] Date of Service: 01/28/2020 1:00 PM Medical Record Number: [id number] Patient Account Number: [id number] Date of Birth/Gender: 1926/07/10 (86 y.o. F) Treating RN: Cornell Barman Primary Care Physician: Derinda Late Other Clinician: Referring Physician: Referral, Self Treating Physician/Extender: Tito Dine in Treatment: 0 Education Assessment Education Provided To: Patient Education Topics Provided Venous: Handouts: Other: kerlix and coban Methods: Demonstration, Explain/Verbal Responses: State content correctly Welcome To The  [facility]: Handouts: Welcome To [facility] Methods: Demonstration, Explain/Verbal Responses: State content correctly Wound/Skin Impairment: Handouts: Caring for Your Ulcer Methods: Demonstration, Explain/Verbal Responses: State content correctly Electronic Signature(s) Signed: 01/30/2020 10:29:18 AM By: Gretta Cool, BSN, RN, [name], Kim RN, BSN Entered By: Gretta Cool, BSN, RN, [name], Kim on 01/28/2020 13:30:44 [name] ([id number]) -------------------------------------------------------------------------------- Wound Assessment Details [name], [name]. Date of Service: 01/28/2020 1:00 PM Medical Record Number: [id number] Patient Account Number: [id number] Date of Birth/Sex: Jan 03, 1926 (86 y.o. F) Treating RN: Montey Hora Primary Care Amaiya Scruton: Derinda Late Other Clinician: Referring Haizley Cannella: Referral, Self Treating Jaylah Goodlow/Extender: Ricard Dillon Weeks in Treatment: 0 Wound Status Wound Number: 3 Primary Etiology: Venous Leg Ulcer Wound Location: Right, Lateral Lower Leg Secondary Trauma, Other Etiology: Wounding Event: Trauma Wound Status: Open Date Acquired: 01/20/2020 Comorbid Cataracts, Deep Vein Thrombosis, Hypertension, Weeks Of Treatment: 0 History: Osteoarthritis, Dementia Clustered Wound: No Photos Wound Measurements Length: (cm) 4 Width: (cm) 4.5 Depth: (cm) 0.2 Area: (cm) 14.137 Volume: (cm) 2.827 % Reduction in Area: % Reduction in Volume: Epithelialization: None Tunneling: No Undermining: No Wound Description Classification: Full Thickness Without Exposed Support Structures Wound Margin: Flat and Intact Exudate Amount: Medium Exudate Type: Sanguinous Exudate Color: red Foul Odor After Cleansing: No Slough/Fibrino Yes Wound Bed Granulation Amount: Medium (34-66%) Exposed Structure Granulation Quality: Pink Fascia Exposed: No Necrotic Amount: Medium (34-66%) Fat Layer (Subcutaneous Tissue) Exposed: Yes Necrotic  Quality: Adherent Slough Tendon Exposed: No Muscle Exposed: No Joint Exposed: No Bone Exposed: No Treatment Notes Wound #3 (Right, Lateral Lower Leg) 1. Cleansed with: Clean wound with Normal Saline 2. Anesthetic Topical Lidocaine 4% cream to wound bed prior to debridement 4. Dressing Applied: Other dressing (specify in notes) [name], [name] ([id number]) 5. Secondary Dressing Applied ABD Pad 7. Secured with Other (specify in notes) Notes Silver Alginate, ABD, Kerlix and Coban, unna to Engineer, production) Signed: 01/28/2020 4:17:41 PM By: Montey Hora Entered By: Montey Hora on 01/28/2020 13:15:20 [name] ([id number]) -------------------------------------------------------------------------------- Vitals Details [name]. Date of Service: 01/28/2020 1:00 PM Medical Record Number: [id number] Patient Account Number: [id number] Date of Birth/Sex: Jun 22, 1926 (86 y.o. F) Treating RN: Cornell Barman Primary Care Sarinah Doetsch: Derinda Late Other Clinician: Referring Riot Barrick: Referral, Self Treating Lisette Mancebo/Extender: Tito Dine in Treatment: 0 Vital Signs Time Taken: 12:55 Temperature (F): 97.9 Height (in): 62 Pulse (bpm): 88 Source: Stated Respiratory Rate (breaths/min): 18 Weight (lbs): 98 Blood Pressure (mmHg): 168/100 Source: Stated Reference Range: 80 - 120 mg / dl Body Mass Index (BMI): 17.9 Electronic Signature(s) Signed: 01/28/2020 3:46:59 PM By: Lorine Bears RCP, RRT, CHT Entered By: Lorine Bears on 01/28/2020 13:03:34

## 2020-02-04 ENCOUNTER — Other Ambulatory Visit: Payer: Self-pay

## 2020-02-04 ENCOUNTER — Encounter: Payer: Medicare Other | Admitting: Internal Medicine

## 2020-02-04 DIAGNOSIS — L97812 Non-pressure chronic ulcer of other part of right lower leg with fat layer exposed: Secondary | ICD-10-CM | POA: Diagnosis not present

## 2020-02-04 NOTE — Progress Notes (Signed)
FUMI, RAMISCAL ([id number]) Visit Report for 02/04/2020 Arrival Information Details [name], [name]. Date of Service: 02/04/2020 12:45 PM Medical Record Number: [id number] Patient Account Number: [id number] Date of Birth/Sex: 03-05-1926 (86 y.o. F) Treating RN: Cornell Barman Primary Care Maclean Foister: Derinda Late Other Clinician: Referring Labib Cwynar: BABAOFF, MARCUS Treating Kevante Lunt/Extender: Tito Dine in Treatment: 1 Visit Information History Since Last Visit Added or deleted any medications: No Patient Arrived: Ambulatory Any new allergies or adverse reactions: No Arrival Time: 12:45 Had a fall or experienced change in No Accompanied By: husband activities of daily living that may affect Transfer Assistance: None risk of falls: Patient Identification Verified: Yes Signs or symptoms of abuse/neglect since last visito No Secondary Verification Process Completed: Yes Hospitalized since last visit: No Patient Has Alerts: Yes Implantable device outside of the clinic excluding No Patient Alerts: Patient on Blood Thinner cellular tissue based products placed in the center warfarin since last visit: Has Dressing in Place as Prescribed: Yes Pain Present Now: Yes Electronic Signature(s) Signed: 02/04/2020 3:16:46 PM By: Lorine Bears RCP, RRT, CHT Entered By: Lorine Bears on 02/04/2020 12:45:50 Gracey, [name] ([id number]) -------------------------------------------------------------------------------- Encounter Discharge Information Details [name]. Date of Service: 02/04/2020 12:45 PM Medical Record Number: [id number] Patient Account Number: [id number] Date of Birth/Sex: 1926-01-05 (86 y.o. F) Treating RN: Cornell Barman Primary Care Ahmaya Ostermiller: Derinda Late Other Clinician: Referring Karita Dralle: BABAOFF, MARCUS Treating Chantel Teti/Extender: Tito Dine in Treatment: 1 Encounter Discharge  Information Items Post Procedure Vitals Discharge Condition: Stable Temperature (F): 98.5 Ambulatory Status: Ambulatory Pulse (bpm): 78 Discharge Destination: Home Respiratory Rate (breaths/min): 16 Transportation: Private Auto Blood Pressure (mmHg): 118/82 Accompanied By: spouse Schedule Follow-up Appointment: Yes Clinical Summary of Care: Electronic Signature(s) Signed: 02/04/2020 4:30:28 PM By: Gretta Cool, BSN, RN, [name], Kim RN, BSN Entered By: Gretta Cool, BSN, RN, [name], Kim on 02/04/2020 13:20:39 [name] ([id number]) -------------------------------------------------------------------------------- Lower Extremity Assessment Details [name], [name] Date of Service: 02/04/2020 12:45 PM Medical Record Number: [id number] Patient Account Number: [id number] Date of Birth/Sex: 1926-08-09 (86 y.o. F) Treating RN: Montey Hora Primary Care Anelle Parlow: BABAOFF, MARCUS Other Clinician: Referring Daja Shuping: BABAOFF, MARCUS Treating Marcelene Weidemann/Extender: Ricard Dillon Weeks in Treatment: 1 Edema Assessment Assessed: [Left: No] [Right: No] Edema: [Left: N] [Right: o] Calf Left: Right: Point of Measurement: 30 cm From Medial Instep cm 28.5 cm Ankle Left: Right: Point of Measurement: 10 cm From Medial Instep cm 20.5 cm Vascular Assessment Pulses: Dorsalis Pedis Palpable: [Right:Yes] Electronic Signature(s) Signed: 02/04/2020 3:35:08 PM By: Montey Hora Entered By: Montey Hora on 02/04/2020 12:57:33 Kaufman, [name] ([id number]) -------------------------------------------------------------------------------- Multi Wound Chart Details [name]. Date of Service: 02/04/2020 12:45 PM Medical Record Number: [id number] Patient Account Number: [id number] Date of Birth/Sex: 1926-06-22 (86 y.o. F) Treating RN: Cornell Barman Primary Care Kieon Lawhorn: BABAOFF, MARCUS Other Clinician: Referring Eshaan Titzer: BABAOFF, MARCUS Treating Ladainian Therien/Extender: Ricard Dillon Weeks in Treatment: 1 Vital Signs Height(in): 62 Pulse(bpm): 78 Weight(lbs): 98 Blood Pressure(mmHg): 118/82 Body Mass Index(BMI): 18 Temperature(F): 98.5 Respiratory Rate(breaths/min): 18 Photos: [N/A:N/A] Wound Location: Right, Lateral Lower Leg N/A N/A Wounding Event: Trauma N/A N/A Primary Etiology: Venous Leg Ulcer N/A N/A Secondary Etiology: Trauma, Other N/A N/A Comorbid History: Cataracts, Deep Vein Thrombosis, N/A N/A Hypertension, Osteoarthritis, Dementia Date Acquired: 01/20/2020 N/A N/A Weeks of Treatment: 1 N/A N/A Wound Status: Open N/A N/A Measurements L x W x D (cm) 5x4.7x0.2 N/A N/A Area (cm) : 18.457 N/A N/A Volume (cm) : 3.691  N/A N/A % Reduction in Area: -30.60% N/A N/A % Reduction in Volume: -30.60% N/A N/A Classification: Full Thickness Without Exposed N/A N/A Support Structures Exudate Amount: Medium N/A N/A Exudate Type: Sanguinous N/A N/A Exudate Color: red N/A N/A Wound Margin: Flat and Intact N/A N/A Granulation Amount: Small (1-33%) N/A N/A Granulation Quality: Pink N/A N/A Necrotic Amount: Large (67-100%) N/A N/A Exposed Structures: Fat Layer (Subcutaneous Tissue) N/A N/A Exposed: Yes Fascia: No Tendon: No [name], [name] ([id number]) Muscle: No Joint: No Bone: No Epithelialization: None N/A N/A Debridement: Debridement - Excisional N/A N/A Pre-procedure Verification/Time 13:12 N/A N/A Out Taken: Pain Control: Lidocaine N/A N/A Tissue Debrided: Blood Clots, Subcutaneous, Slough N/A N/A Level: Skin/Subcutaneous Tissue N/A N/A Debridement Area (sq cm): 23.5 N/A N/A Instrument: Curette N/A N/A Bleeding: Large N/A N/A Hemostasis Achieved: Pressure N/A N/A Debridement Treatment Procedure was tolerated well N/A N/A Response: Post Debridement Measurements 5x4.7x0.3 N/A N/A L x W x D (cm) Post Debridement Volume: (cm) 5.537 N/A N/A Procedures Performed: Debridement N/A N/A Treatment Notes Wound #3 (Right, Lateral Lower  Leg) Notes Silver Alginate, ABD, Kerlix and Coban, unna to anchor Electronic Signature(s) Signed: 02/04/2020 4:08:23 PM By: Linton Ham MD Entered By: Linton Ham on 02/04/2020 13:19:00 [name] ([id number]) -------------------------------------------------------------------------------- Multi-Disciplinary Care Plan Details [name]. Date of Service: 02/04/2020 12:45 PM Medical Record Number: [id number] Patient Account Number: [id number] Date of Birth/Sex: 02-07-1926 (86 y.o. F) Treating RN: Cornell Barman Primary Care Rane Blitch: Derinda Late Other Clinician: Referring Aidenn Skellenger: BABAOFF, MARCUS Treating Luccas Towell/Extender: Tito Dine in Treatment: 1 Active Inactive Necrotic Tissue Nursing Diagnoses: Impaired tissue integrity related to necrotic/devitalized tissue Goals: Necrotic/devitalized tissue will be minimized in the wound bed Date Initiated: 01/28/2020 Target Resolution Date: 02/04/2020 Goal Status: Active Interventions: Assess patient pain level pre-, during and post procedure and prior to discharge Treatment Activities: Apply topical anesthetic as ordered : 01/28/2020 Notes: Orientation to the Wound Care Program Nursing Diagnoses: Knowledge deficit related to the wound healing center program Goals: Patient/caregiver will verbalize understanding of the [place] Date Initiated: 01/28/2020 Target Resolution Date: 02/04/2020 Goal Status: Active Interventions: Provide education on orientation to the wound center Notes: Soft Tissue Infection Nursing Diagnoses: Impaired tissue integrity Goals: Patient will remain free of wound infection Date Initiated: 01/28/2020 Target Resolution Date: 02/11/2020 Goal Status: Active Interventions: Assess signs and symptoms of infection every visit Notes: Wound/Skin Impairment [name], [name] ([id number]) Nursing Diagnoses: Impaired tissue integrity Goals: Ulcer/skin  breakdown will have a volume reduction of 30% by week 4 Date Initiated: 01/28/2020 Target Resolution Date: 02/27/2020 Goal Status: Active Interventions: Assess ulceration(s) every visit Treatment Activities: Skin care regimen initiated : 01/28/2020 Topical wound management initiated : 01/28/2020 Notes: Electronic Signature(s) Signed: 02/04/2020 4:30:28 PM By: Gretta Cool, BSN, RN, [name], Kim RN, BSN Entered By: Gretta Cool, BSN, RN, [name], Kim on 02/04/2020 13:14:08 Bergey, [name] ([id number]) -------------------------------------------------------------------------------- Pain Assessment Details [name]. Date of Service: 02/04/2020 12:45 PM Medical Record Number: [id number] Patient Account Number: [id number] Date of Birth/Sex: 1926-01-06 (86 y.o. F) Treating RN: Montey Hora Primary Care Arran Fessel: Derinda Late Other Clinician: Referring Teal Bontrager: BABAOFF, MARCUS Treating Rachyl Wuebker/Extender: Ricard Dillon Weeks in Treatment: 1 Active Problems Location of Pain Severity and Description of Pain Patient Has Paino Yes Site Locations Pain Location: Pain in Ulcers With Dressing Change: Yes Duration of the Pain. Constant / Intermittento Constant Pain Management and Medication Current Pain Management: Electronic Signature(s) Signed: 02/04/2020 3:35:08 PM By: Montey Hora Entered By:  Montey Hora on 02/04/2020 [name], [name] ([id number]) -------------------------------------------------------------------------------- Patient/Caregiver Education Details [name], VINCE. Date of Service: 02/04/2020 12:45 PM Medical Record Number: [id number] Patient Account Number: [id number] Date of Birth/Gender: [birth year] (86 y.o. F) Treating RN: Cornell Barman Primary Care Physician: Derinda Late Other Clinician: Referring Physician: BABAOFF, MARCUS Treating Physician/Extender: Tito Dine in Treatment: 1 Education Assessment Education Provided  To: Patient and Caregiver Education Topics Provided Wound Debridement: Handouts: Wound Debridement Methods: Demonstration Responses: State content correctly Wound/Skin Impairment: Handouts: Caring for Your Ulcer Methods: Demonstration, Explain/Verbal Responses: State content correctly Electronic Signature(s) Signed: 02/04/2020 4:30:28 PM By: Gretta Cool, BSN, RN, [name], Kim RN, BSN Entered By: Gretta Cool, BSN, RN, [name], Kim on 02/04/2020 13:17:48 [name] ([id number]) -------------------------------------------------------------------------------- Wound Assessment Details [name], [name] Date of Service: 02/04/2020 12:45 PM Medical Record Number: [id number] Patient Account Number: [id number] Date of Birth/Sex: Dec 04, 1925 (86 y.o. F) Treating RN: Montey Hora Primary Care Janetta Vandoren: BABAOFF, MARCUS Other Clinician: Referring Naveena Eyman: BABAOFF, MARCUS Treating Shaolin Armas/Extender: Ricard Dillon Weeks in Treatment: 1 Wound Status Wound Number: 3 Primary Etiology: Venous Leg Ulcer Wound Location: Right, Lateral Lower Leg Secondary Trauma, Other Etiology: Wounding Event: Trauma Wound Status: Open Date Acquired: 01/20/2020 Comorbid Cataracts, Deep Vein Thrombosis, Hypertension, Weeks Of Treatment: 1 History: Osteoarthritis, Dementia Clustered Wound: No Photos Wound Measurements Length: (cm) 5 Width: (cm) 4.7 Depth: (cm) 0.2 Area: (cm) 18.457 Volume: (cm) 3.691 % Reduction in Area: -30.6% % Reduction in Volume: -30.6% Epithelialization: None Tunneling: No Undermining: No Wound Description Classification: Full Thickness Without Exposed Support Structures Wound Margin: Flat and Intact Exudate Amount: Medium Exudate Type: Sanguinous Exudate Color: red Foul Odor After Cleansing: No Slough/Fibrino Yes Wound Bed Granulation Amount: Small (1-33%) Exposed Structure Granulation Quality: Pink Fascia Exposed: No Necrotic Amount: Large (67-100%) Fat Layer  (Subcutaneous Tissue) Exposed: Yes Necrotic Quality: Adherent Slough Tendon Exposed: No Muscle Exposed: No Joint Exposed: No Bone Exposed: No Treatment Notes Wound #3 (Right, Lateral Lower Leg) Notes Silver Alginate, ABD, Kerlix and Coban, unna to anchor Electronic Signature(s) Signed: 02/04/2020 3:35:08 PM By: Veto Kemps, [name] ([id number]) Entered By: Montey Hora on 02/04/2020 13:03:08 [name] ([id number]) -------------------------------------------------------------------------------- Vitals Details [name]. Date of Service: 02/04/2020 12:45 PM Medical Record Number: [id number] Patient Account Number: [id number] Date of Birth/Sex: Oct 17, 1926 (86 y.o. F) Treating RN: Cornell Barman Primary Care Yun Gutierrez: BABAOFF, MARCUS Other Clinician: Referring Lijah Bourque: BABAOFF, MARCUS Treating Kahlia Lagunes/Extender: Ricard Dillon Weeks in Treatment: 1 Vital Signs Time Taken: 12:45 Temperature (F): 98.5 Height (in): 62 Pulse (bpm): 78 Weight (lbs): 98 Respiratory Rate (breaths/min): 18 Body Mass Index (BMI): 17.9 Blood Pressure (mmHg): 118/82 Reference Range: 80 - 120 mg / dl Electronic Signature(s) Signed: 02/04/2020 3:16:46 PM By: Lorine Bears RCP, RRT, CHT Entered By: Lorine Bears on 02/04/2020 12:47:10

## 2020-02-04 NOTE — Progress Notes (Signed)
[name] ([id number]) Visit Report for 02/04/2020 Debridement Details [name], [name]. Date of Service: 02/04/2020 12:45 PM Medical Record Number: [id number] Patient Account Number: [id number] Date of Birth/Sex: Aug 26, 1926 (86 y.o. F) Treating RN: Cornell Barman Primary Care Provider: BABAOFF, MARCUS Other Clinician: Referring Provider: BABAOFF, MARCUS Treating Provider/Extender: Ricard Dillon Weeks in Treatment: 1 Debridement Performed for Wound #3 Right,Lateral Lower Leg Assessment: Performed By: Physician Ricard Dillon, MD Debridement Type: Debridement Severity of Tissue Pre Debridement: Fat layer exposed Level of Consciousness (Pre- Awake and Alert procedure): Pre-procedure Verification/Time Out Yes - 13:12 Taken: Start Time: 13:12 Pain Control: Lidocaine Total Area Debrided (L x W): 5 (cm) x 4.7 (cm) = 23.5 (cm) Tissue and other material debrided: Viable, Non-Viable, Blood Clots, Slough, Subcutaneous, Slough Level: Skin/Subcutaneous Tissue Debridement Description: Excisional Instrument: Curette Bleeding: Large Hemostasis Achieved: Pressure End Time: 13:15 Response to Treatment: Procedure was tolerated well Level of Consciousness (Post- Awake and Alert procedure): Post Debridement Measurements of Total Wound Length: (cm) 5 Width: (cm) 4.7 Depth: (cm) 0.3 Volume: (cm) 5.537 Character of Wound/Ulcer Post Debridement: Stable Severity of Tissue Post Debridement: Fat layer exposed Post Procedure Diagnosis Same as Pre-procedure Electronic Signature(s) Signed: 02/04/2020 4:08:23 PM By: Linton Ham MD Signed: 02/04/2020 4:30:28 PM By: Gretta Cool, BSN, RN, [name], Kim RN, BSN Entered By: Linton Ham on 02/04/2020 13:19:10 Osowski, [name] ([id number]) -------------------------------------------------------------------------------- HPI Details [name]. Date of Service: 02/04/2020 12:45 PM Medical Record Number:  [id number] Patient Account Number: [id number] Date of Birth/Sex: 1926-02-21 (86 y.o. F) Treating RN: Cornell Barman Primary Care Provider: Derinda Late Other Clinician: Referring Provider: BABAOFF, MARCUS Treating Provider/Extender: Tito Dine in Treatment: 1 History of Present Illness HPI Description: 06/06/17 on evaluation today patient presents for evaluation concerning an ulcer which she initially had arise as a result of striking her left lower extremity money bed frame. With that being said this was roughly one month ago and unfortunately the wound despite two courses of Keflex have not really made a dramatic improvement. This has continued to drain as well as being exquisitely tender at times. Even to the point that she is having a difficult time walking. Patient does have chronic atrial fibrillation which subsequently leads to her being on long-term anticoagulant therapy and this causes ED bruising which may have contributed to this entry and where things are at this point. She does have valvular heart disease as well as hypertension and her last INR was 2.7. Currently Neosporin, Gauls, and an ace wrap has been utilized. Keflex is the antibiotic that she has been on. Patient has not had a culture up to this point and she states that her blood pressure at the primary care office yesterday was 138/72. Her pain is significant related to be an eight out of 10. No fevers, chills, nausea, or vomiting noted at this time. 06/20/17; patient was admitted here 2 weeks ago. She had a traumatic wound on her left lateral lower leg probably in the setting of some degree of venous insufficiency with surrounding cellulitis. She had been on Keflex, after we saw her she was put on doxycycline which she apparently did not tolerate. Culture that was done at the time showed Morganella which was resistant to Keflex. Apparently the patient continued to take Keflex after the appointment. She is also on  Coumadin. I had tried to change her antibiotic when the Morganella culture was brought to my attention however apparently our staff could not reach the patient to  inform them of the antibiotic change 06/26/17; patient has wound on her left lateral lower extremity in the setting of some degree of venous insufficiency. I gave her cefdinir last week and she is completing this. There is no evidence of surrounding infection. Aggressive debridement last week, wound bed looks healthier. We've been using Aquacel. They're traveling in the [place] next week we'll see her back in 2 weeks unless there are problems. 07/10/17; the patient is been using Aquacel Ag her husband is changing this with Kerlix and conform. She still complains of a lot of pain. Apparently with the antibiotics. We gave 2 or 3 weeks ago. Her INR went up to 6, this was managed by her primary physician 07/17/17; she is using Aquacel Ag and a border foam. Still discomfort although dimensions are better. 07/24/17; patient or for review of a trauma wound on her left anterior leg in the setting of some degree of chronic venous insufficiency she has been using Aquacel Ag and a border foam and making progress. 07/31/17; only a small open area of this original significant trauma is still open. Her husband is changing this every second day [Aquacel Ag] Readmission: 01/14/19 on evaluation today patient is that she seen for initial inspection during the office visit today concerning a trauma/skin tear to the left anterior lower extremity. This is roughly the same region where she was previously treated and years past when she was here in the clinic. Nonetheless upon evaluation today the patient's wound actually showed signs of decent granulation around the edge although unfortunately the skin flap that was torn back did fold under on itself and the flap itself is dying. She does have some eschar surrounded the edges of the wound at this point. Nonetheless  this also was very tender for her. The patient does have a history of hypertension, atrial fibrillation, and long-term use anticoagulant therapy. 01/21/19 on evaluation today patient's wound on the anterior portion of her lower extremity appears to still be necrotic as far as the surface of the wound is concerned. Unfortunately there is no significant improvement at this point compared to last week they were not able to get the Santyl that are using Medihoney and maybe one reason why. The other is I think this may be stained to drive. Possibly adding something such as mepitel over top of the Medihoney may help to loosen this up quite a bit which I think would be in her best interest. Also she still has a lot of erythema and is very tender to touch I'm afraid that we may need to switch to a different antibiotic to see if this will be of benefit there still really nothing that I can culture to get a better picture of what's going on and what we need to do from the standpoint of antibiotics. I do believe the patient needs a referral Talmuds me to vascular in order to evaluate her blood flow based on what I'm seeing they will be able to perform TBI testing if nothing else along with the vascular evaluation to see if there's any evidence for limited blood flow to this lower extremity. 4/8; I have reviewed the patient's arterial studies and they are really quite good. There should be no arterial impediments to healing the wound on the right anterior tibial area. 4/15; patient has a small open area over the left mid tibial area. Still requiring debridement we are using Santyl here and Medihoney at home [Santyl unaffordable] 4/22; difficult, presumably traumatic area over  the left mid tibial area in the setting of chronic venous insufficiency. We have been using Santyl here and meta honey at home. The wound surface is getting gradually better and slight improvement in dimensions. Her husband is changing this  daily 4/29; left mid tibia presumably traumatic wound in the setting of chronic venous insufficiency. We have been using Santyl and a combination of Medihoney at home. Arrives today with a much better looking healthy granulated surface. We will change the primary dressing to [facility] 5/6; left mid tibia smaller reasonably healthy looking wound using Hydrofera Blue since last week 5/20-Patient returns to clinic with a left mid tibial wound looking better, we have been using Hydrofera Blue since the previous week 5/27; patient returns to clinic with a left mid tibial wound covered in raised eschar. We have been using Hydrofera Blue her husband changes the dressings using a border foam cover READMISSION 01/28/20 Mrs. Molyneaux is a patient who is now 86 years old. As usual she comes in with her husband who is her primary caregiver. They were here last from March through May 2020 with a traumatic wound on the left anterior tibia we eventually heal this out. She has chronic venous insufficiency. She is also here previously in 2018. MEGON, MURGO ([id number]) Her current problem started 8 days ago. She went in the hospital for a thoracic level kyphoplasty. The procedure she developed a skin tear on her leg. This was mentioned to her. Her husband is applying Medihoney to the area and they are in for our review of this. She finds this much too uncomfortable to attempt ABIs although her ABIs when she was in the clinic last year were quite normal bilaterally Past medical history she is not a diabetic. As noted she had a T9 and T6 kyphoplasty, Alzheimer's disease, chronic atrial fibrillation on Coumadin and hypertension 4/14; patient arrives in clinic having removed part of the wrap. We attempted to leave this on all week. Unfortunately none that none of the tissue that looks semiviable last week has remained viable. She required an extensive debridement. We have been using silver alginate Electronic  Signature(s) Signed: 02/04/2020 4:08:23 PM By: Linton Ham MD Entered By: Linton Ham on 02/04/2020 13:21:59 [name] ([id number]) -------------------------------------------------------------------------------- Physical Exam Details Patient Name: LIESEL, DANZA C. Date of Service: 02/04/2020 12:45 PM Medical Record Number: [id number] Patient Account Number: [id number] Date of Birth/Sex: 1926-03-01 (86 y.o. F) Treating RN: Cornell Barman Primary Care Provider: Derinda Late Other Clinician: Referring Provider: BABAOFF, MARCUS Treating Provider/Extender: Ricard Dillon Weeks in Treatment: 1 Constitutional Sitting or standing Blood Pressure is within target range for patient.. Pulse regular and within target range for patient.Marland Kitchen Respirations regular, non- labored and within target range.. Temperature is normal and within the target range for the patient.Marland Kitchen appears in no distress. Cardiovascular Pedal pulses are palpable. Notes Wound exam; sizable skin tear in the right anterior lower leg. All of this is necrotic this week which is unfortunate. She required a difficult and fairly extensive debridement with a #5 curette removing nonviable skin subcutaneous tissue and some residual hematoma. I am able to get most of this off. The surface does not look too bad. There is surrounding bruising but no clear evidence of cellulitis. Electronic Signature(s) Signed: 02/04/2020 4:08:23 PM By: Linton Ham MD Entered By: Linton Ham on 02/04/2020 13:23:09 [name] ([id number]) -------------------------------------------------------------------------------- Physician Orders Details [name] Date of Service: 02/04/2020 12:45 PM Medical Record Number: [id number] Patient Account Number:  [id number] Date of Birth/Sex: 11-27-1925 (86 y.o. F) Treating RN: Cornell Barman Primary Care Provider: BABAOFF, MARCUS Other Clinician: Referring Provider: BABAOFF,  MARCUS Treating Provider/Extender: Tito Dine in Treatment: 1 Verbal / Phone Orders: No Diagnosis Coding Wound Cleansing Wound #3 Right,Lateral Lower Leg o Cleanse wound with mild soap and water Anesthetic (add to Medication List) Wound #3 Right,Lateral Lower Leg o Topical Lidocaine 4% cream applied to wound bed prior to debridement (In Clinic Only). Primary Wound Dressing Wound #3 Right,Lateral Lower Leg o Silver Alginate Secondary Dressing Wound #3 Right,Lateral Lower Leg o ABD pad Dressing Change Frequency Wound #3 Right,Lateral Lower Leg o Change dressing every week Follow-up Appointments Wound #3 Right,Lateral Lower Leg o Return Appointment in 1 week. Edema Control Wound #3 Right,Lateral Lower Leg o Kerlix and Coban - Right Lower Extremity Additional Orders / Instructions Wound #3 Right,Lateral Lower Leg o Increase protein intake. o Activity as tolerated Electronic Signature(s) Signed: 02/04/2020 4:08:23 PM By: Linton Ham MD Signed: 02/04/2020 4:30:28 PM By: Gretta Cool, BSN, RN, [name], Kim RN, BSN Entered By: Gretta Cool, BSN, RN, [name], Kim on 02/04/2020 13:17:15 SHERION, KECKLER ([id number]) -------------------------------------------------------------------------------- Problem List Details Patient Name: AHANA, HANDLER. Date of Service: 02/04/2020 12:45 PM Medical Record Number: [id number] Patient Account Number: [id number] Date of Birth/Sex: 1926-01-01 (86 y.o. F) Treating RN: Cornell Barman Primary Care Provider: Derinda Late Other Clinician: Referring Provider: BABAOFF, MARCUS Treating Provider/Extender: Tito Dine in Treatment: 1 Active Problems ICD-10 Evaluated Encounter Code Description Active Date Today Diagnosis S81.811D Laceration without foreign body, right lower leg, subsequent encounter 01/28/2020 No Yes L97.812 Non-pressure chronic ulcer of other part of right lower leg with fat layer 01/28/2020 No  Yes exposed I87.321 Chronic venous hypertension (idiopathic) with inflammation of right lower 01/28/2020 No Yes extremity Inactive Problems Resolved Problems Electronic Signature(s) Signed: 02/04/2020 4:08:23 PM By: Linton Ham MD Entered By: Linton Ham on 02/04/2020 13:18:53 Cockrell, [name] ([id number]) -------------------------------------------------------------------------------- Progress Note Details [name]. Date of Service: 02/04/2020 12:45 PM Medical Record Number: [id number] Patient Account Number: [id number] Date of Birth/Sex: 1926-09-04 (86 y.o. F) Treating RN: Cornell Barman Primary Care Provider: Derinda Late Other Clinician: Referring Provider: BABAOFF, MARCUS Treating Provider/Extender: Ricard Dillon Weeks in Treatment: 1 Subjective History of Present Illness (HPI) 06/06/17 on evaluation today patient presents for evaluation concerning an ulcer which she initially had arise as a result of striking her left lower extremity money bed frame. With that being said this was roughly one month ago and unfortunately the wound despite two courses of Keflex have not really made a dramatic improvement. This has continued to drain as well as being exquisitely tender at times. Even to the point that she is having a difficult time walking. Patient does have chronic atrial fibrillation which subsequently leads to her being on long-term anticoagulant therapy and this causes ED bruising which may have contributed to this entry and where things are at this point. She does have valvular heart disease as well as hypertension and her last INR was 2.7. Currently Neosporin, Gauls, and an ace wrap has been utilized. Keflex is the antibiotic that she has been on. Patient has not had a culture up to this point and she states that her blood pressure at the primary care office yesterday was 138/72. Her pain is significant related to be an eight out of 10. No fevers,  chills, nausea, or vomiting noted at this time. 06/20/17; patient was admitted here 2 weeks ago. She had a traumatic  wound on her left lateral lower leg probably in the setting of some degree of venous insufficiency with surrounding cellulitis. She had been on Keflex, after we saw her she was put on doxycycline which she apparently did not tolerate. Culture that was done at the time showed Morganella which was resistant to Keflex. Apparently the patient continued to take Keflex after the appointment. She is also on Coumadin. I had tried to change her antibiotic when the Morganella culture was brought to my attention however apparently our staff could not reach the patient to inform them of the antibiotic change 06/26/17; patient has wound on her left lateral lower extremity in the setting of some degree of venous insufficiency. I gave her cefdinir last week and she is completing this. There is no evidence of surrounding infection. Aggressive debridement last week, wound bed looks healthier. We've been using Aquacel. They're traveling in the [place] next week we'll see her back in 2 weeks unless there are problems. 07/10/17; the patient is been using Aquacel Ag her husband is changing this with Kerlix and conform. She still complains of a lot of pain. Apparently with the antibiotics. We gave 2 or 3 weeks ago. Her INR went up to 6, this was managed by her primary physician 07/17/17; she is using Aquacel Ag and a border foam. Still discomfort although dimensions are better. 07/24/17; patient or for review of a trauma wound on her left anterior leg in the setting of some degree of chronic venous insufficiency she has been using Aquacel Ag and a border foam and making progress. 07/31/17; only a small open area of this original significant trauma is still open. Her husband is changing this every second day [Aquacel Ag] Readmission: 01/14/19 on evaluation today patient is that she seen for initial inspection  during the office visit today concerning a trauma/skin tear to the left anterior lower extremity. This is roughly the same region where she was previously treated and years past when she was here in the clinic. Nonetheless upon evaluation today the patient's wound actually showed signs of decent granulation around the edge although unfortunately the skin flap that was torn back did fold under on itself and the flap itself is dying. She does have some eschar surrounded the edges of the wound at this point. Nonetheless this also was very tender for her. The patient does have a history of hypertension, atrial fibrillation, and long-term use anticoagulant therapy. 01/21/19 on evaluation today patient's wound on the anterior portion of her lower extremity appears to still be necrotic as far as the surface of the wound is concerned. Unfortunately there is no significant improvement at this point compared to last week they were not able to get the Santyl that are using Medihoney and maybe one reason why. The other is I think this may be stained to drive. Possibly adding something such as mepitel over top of the Medihoney may help to loosen this up quite a bit which I think would be in her best interest. Also she still has a lot of erythema and is very tender to touch I'm afraid that we may need to switch to a different antibiotic to see if this will be of benefit there still really nothing that I can culture to get a better picture of what's going on and what we need to do from the standpoint of antibiotics. I do believe the patient needs a referral Talmuds me to vascular in order to evaluate her blood flow based on what  I'm seeing they will be able to perform TBI testing if nothing else along with the vascular evaluation to see if there's any evidence for limited blood flow to this lower extremity. 4/8; I have reviewed the patient's arterial studies and they are really quite good. There should be no arterial  impediments to healing the wound on the right anterior tibial area. 4/15; patient has a small open area over the left mid tibial area. Still requiring debridement we are using Santyl here and Medihoney at home [Santyl unaffordable] 4/22; difficult, presumably traumatic area over the left mid tibial area in the setting of chronic venous insufficiency. We have been using Santyl here and meta honey at home. The wound surface is getting gradually better and slight improvement in dimensions. Her husband is changing this daily 4/29; left mid tibia presumably traumatic wound in the setting of chronic venous insufficiency. We have been using Santyl and a combination of Medihoney at home. Arrives today with a much better looking healthy granulated surface. We will change the primary dressing to [facility] 5/6; left mid tibia smaller reasonably healthy looking wound using Hydrofera Blue since last week 5/20-Patient returns to clinic with a left mid tibial wound looking better, we have been using Hydrofera Blue since the previous week 5/27; patient returns to clinic with a left mid tibial wound covered in raised eschar. We have been using Hydrofera Blue her husband changes the dressings using a border foam cover READMISSION 01/28/20 Mrs. Much is a patient who is now 86 years old. As usual she comes in with her husband who is her primary caregiver. They were here last from March through May 2020 with a traumatic wound on the left anterior tibia we eventually heal this out. She has chronic venous insufficiency. She is also here previously in 2018. BRANDICE, HUGIE ([id number]) Her current problem started 8 days ago. She went in the hospital for a thoracic level kyphoplasty. The procedure she developed a skin tear on her leg. This was mentioned to her. Her husband is applying Medihoney to the area and they are in for our review of this. She finds this much too uncomfortable to attempt ABIs although her  ABIs when she was in the clinic last year were quite normal bilaterally Past medical history she is not a diabetic. As noted she had a T9 and T6 kyphoplasty, Alzheimer's disease, chronic atrial fibrillation on Coumadin and hypertension 4/14; patient arrives in clinic having removed part of the wrap. We attempted to leave this on all week. Unfortunately none that none of the tissue that looks semiviable last week has remained viable. She required an extensive debridement. We have been using silver alginate Objective Constitutional Sitting or standing Blood Pressure is within target range for patient.. Pulse regular and within target range for patient.Marland Kitchen Respirations regular, non- labored and within target range.. Temperature is normal and within the target range for the patient.Marland Kitchen appears in no distress. Vitals Time Taken: 12:45 PM, Height: 62 in, Weight: 98 lbs, BMI: 17.9, Temperature: 98.5 F, Pulse: 78 bpm, Respiratory Rate: 18 breaths/min, Blood Pressure: 118/82 mmHg. Cardiovascular Pedal pulses are palpable. General Notes: Wound exam; sizable skin tear in the right anterior lower leg. All of this is necrotic this week which is unfortunate. She required a difficult and fairly extensive debridement with a #5 curette removing nonviable skin subcutaneous tissue and some residual hematoma. I am able to get most of this off. The surface does not look too bad. There is surrounding bruising  but no clear evidence of cellulitis. Integumentary (Hair, Skin) Wound #3 status is Open. Original cause of wound was Trauma. The wound is located on the Right,Lateral Lower Leg. The wound measures 5cm length x 4.7cm width x 0.2cm depth; 18.457cm^2 area and 3.691cm^3 volume. There is Fat Layer (Subcutaneous Tissue) Exposed exposed. There is no tunneling or undermining noted. There is a medium amount of sanguinous drainage noted. The wound margin is flat and intact. There is small (1-33%) pink granulation within the  wound bed. There is a large (67-100%) amount of necrotic tissue within the wound bed including Adherent Slough. Assessment Active Problems ICD-10 Laceration without foreign body, right lower leg, subsequent encounter Non-pressure chronic ulcer of other part of right lower leg with fat layer exposed Chronic venous hypertension (idiopathic) with inflammation of right lower extremity Procedures Wound #3 Pre-procedure diagnosis of Wound #3 is a Venous Leg Ulcer located on the Right,Lateral Lower Leg .Severity of Tissue Pre Debridement is: Fat layer exposed. There was a Excisional Skin/Subcutaneous Tissue Debridement with a total area of 23.5 sq cm performed by Ricard Dillon, MD. With the following instrument(s): Curette to remove Viable and Non-Viable tissue/material. Material removed includes Blood Clots, Subcutaneous Tissue, and [place] after achieving pain control using Lidocaine. No specimens were taken. A time out was conducted at 13:12, prior to the start of the procedure. A Large amount of bleeding was controlled with Pressure. The procedure was tolerated well. Post Debridement Measurements: 5cm length x 4.7cm width x 0.3cm depth; 5.537cm^3 volume. ARDIE, MAZZOTTI ([id number]) Character of Wound/Ulcer Post Debridement is stable. Severity of Tissue Post Debridement is: Fat layer exposed. Post procedure Diagnosis Wound #3: Same as Pre-Procedure Plan Wound Cleansing: Wound #3 Right,Lateral Lower Leg: Cleanse wound with mild soap and water Anesthetic (add to Medication List): Wound #3 Right,Lateral Lower Leg: Topical Lidocaine 4% cream applied to wound bed prior to debridement (In Clinic Only). Primary Wound Dressing: Wound #3 Right,Lateral Lower Leg: Silver Alginate Secondary Dressing: Wound #3 Right,Lateral Lower Leg: ABD pad Dressing Change Frequency: Wound #3 Right,Lateral Lower Leg: Change dressing every week Follow-up Appointments: Wound #3 Right,Lateral Lower  Leg: Return Appointment in 1 week. Edema Control: Wound #3 Right,Lateral Lower Leg: Kerlix and Coban - Right Lower Extremity Additional Orders / Instructions: Wound #3 Right,Lateral Lower Leg: Increase protein intake. Activity as tolerated 1. We continued with silver alginate/ABDs under kerlix and Coban 2. We will try to get home health out to change the dressing once 3. The debridement is difficult hopefully we do not have to go through this too many more times although certainly some cleanup debridement is likely to be necessary next week Electronic Signature(s) Signed: 02/04/2020 4:08:23 PM By: Linton Ham MD Entered By: Linton Ham on 02/04/2020 13:24:21 [name] ([id number]) -------------------------------------------------------------------------------- SuperBill Details [name]. Date of Service: 02/04/2020 Medical Record Number: [id number] Patient Account Number: [id number] Date of Birth/Sex: 08/20/1926 (86 y.o. F) Treating RN: Cornell Barman Primary Care Provider: Derinda Late Other Clinician: Referring Provider: BABAOFF, MARCUS Treating Provider/Extender: Ricard Dillon Weeks in Treatment: 1 Diagnosis Coding ICD-10 Codes Code Description S81.811D Laceration without foreign body, right lower leg, subsequent encounter L97.812 Non-pressure chronic ulcer of other part of right lower leg with fat layer exposed I87.321 Chronic venous hypertension (idiopathic) with inflammation of right lower extremity Facility Procedures CPT4 Code: [id number] Description: 11042 - DEB SUBQ TISSUE 20 SQ CM/< Modifier: Quantity: 1 CPT4 Code: Description: ICD-10 Diagnosis Description S81.811D Laceration without foreign body, right lower leg, subsequent  encounter [phone] Non-pressure chronic ulcer of other part of right lower leg with fat layer e I87.321 Chronic venous hypertension  (idiopathic) with inflammation of right lower ex Modifier: xposed  tremity Quantity: CPT4 Code: [id number] Description: [id number] - DEB SUBQ TISS EA ADDL 20CM Modifier: Quantity: 1 CPT4 Code: Description: ICD-10 Diagnosis Description S81.811D Laceration without foreign body, right lower leg, subsequent encounter [id number] Non-pressure chronic ulcer of other part of right lower leg with fat layer e Modifier: xposed Quantity: Physician Procedures CPT4 [id number] Description: [id number] - WC PHYS SUBQ TISS 20 SQ CM Modifier: Quantity: 1 CPT4 Code: Description: ICD-10 Diagnosis Description S81.811D Laceration without foreign body, right lower leg, subsequent encounter [id number] Non-pressure chronic ulcer of other part of right lower leg with fat layer ex I87.321 Chronic venous hypertension  (idiopathic) with inflammation of right lower ext Modifier: posed remity Quantity: CPT4 [id number] Description: [id number] - WC PHYS SUBQ TISS EA ADDL 20 CM Modifier: Quantity: 1 CPT4 Code: Description: ICD-10 Diagnosis Description [id number] Laceration without foreign body, right lower leg, subsequent encounter [id number] Non-pressure chronic ulcer of other part of right lower leg with fat layer ex Modifier: posed Quantity: Electronic Signature(s) Signed: 02/04/2020 4:08:23 PM By: Linton Ham MD Entered By: Linton Ham on 02/04/2020 13:24:52

## 2020-02-11 ENCOUNTER — Inpatient Hospital Stay
Admission: EM | Admit: 2020-02-11 | Discharge: 2020-02-19 | DRG: 603 | Disposition: A | Discharge status: Home Health | Payer: Medicare Other | Attending: Internal Medicine | Admitting: Internal Medicine

## 2020-02-11 ENCOUNTER — Encounter: Payer: Medicare Other | Admitting: Internal Medicine

## 2020-02-11 ENCOUNTER — Emergency Department: Payer: Medicare Other

## 2020-02-11 ENCOUNTER — Other Ambulatory Visit: Payer: Self-pay

## 2020-02-11 DIAGNOSIS — F0390 Unspecified dementia without behavioral disturbance: Secondary | ICD-10-CM

## 2020-02-11 DIAGNOSIS — I081 Rheumatic disorders of both mitral and tricuspid valves: Secondary | ICD-10-CM | POA: Diagnosis present

## 2020-02-11 DIAGNOSIS — Z87891 Personal history of nicotine dependence: Secondary | ICD-10-CM

## 2020-02-11 DIAGNOSIS — E871 Hypo-osmolality and hyponatremia: Secondary | ICD-10-CM

## 2020-02-11 DIAGNOSIS — R413 Other amnesia: Secondary | ICD-10-CM

## 2020-02-11 DIAGNOSIS — Z20822 Contact with and (suspected) exposure to covid-19: Secondary | ICD-10-CM | POA: Diagnosis present

## 2020-02-11 DIAGNOSIS — I272 Pulmonary hypertension, unspecified: Secondary | ICD-10-CM | POA: Diagnosis present

## 2020-02-11 DIAGNOSIS — H8109 Meniere's disease, unspecified ear: Secondary | ICD-10-CM | POA: Diagnosis present

## 2020-02-11 DIAGNOSIS — L03115 Cellulitis of right lower limb: Secondary | ICD-10-CM | POA: Diagnosis not present

## 2020-02-11 DIAGNOSIS — E861 Hypovolemia: Secondary | ICD-10-CM | POA: Diagnosis not present

## 2020-02-11 DIAGNOSIS — I340 Nonrheumatic mitral (valve) insufficiency: Secondary | ICD-10-CM | POA: Diagnosis present

## 2020-02-11 DIAGNOSIS — I10 Essential (primary) hypertension: Secondary | ICD-10-CM | POA: Diagnosis present

## 2020-02-11 DIAGNOSIS — L97812 Non-pressure chronic ulcer of other part of right lower leg with fat layer exposed: Secondary | ICD-10-CM | POA: Diagnosis present

## 2020-02-11 DIAGNOSIS — I071 Rheumatic tricuspid insufficiency: Secondary | ICD-10-CM | POA: Diagnosis present

## 2020-02-11 DIAGNOSIS — Z79899 Other long term (current) drug therapy: Secondary | ICD-10-CM

## 2020-02-11 DIAGNOSIS — Z7901 Long term (current) use of anticoagulants: Secondary | ICD-10-CM

## 2020-02-11 DIAGNOSIS — E876 Hypokalemia: Secondary | ICD-10-CM

## 2020-02-11 DIAGNOSIS — Z8249 Family history of ischemic heart disease and other diseases of the circulatory system: Secondary | ICD-10-CM

## 2020-02-11 DIAGNOSIS — L97912 Non-pressure chronic ulcer of unspecified part of right lower leg with fat layer exposed: Secondary | ICD-10-CM

## 2020-02-11 DIAGNOSIS — I482 Chronic atrial fibrillation, unspecified: Secondary | ICD-10-CM | POA: Diagnosis present

## 2020-02-11 LAB — URINALYSIS, COMPLETE (UACMP) WITH MICROSCOPIC
Bilirubin Urine: NEGATIVE
Glucose, UA: NEGATIVE mg/dL
Hgb urine dipstick: NEGATIVE
Ketones, ur: 5 mg/dL — AB
Nitrite: POSITIVE — AB
Protein, ur: 100 mg/dL — AB
Specific Gravity, Urine: 1.01 (ref 1.005–1.030)
WBC, UA: >50 WBC/hpf — ABNORMAL HIGH (ref 0–5)
pH: 8 (ref 5.0–8.0)

## 2020-02-11 LAB — CBC WITH DIFFERENTIAL/PLATELET
Abs Immature Granulocytes: 0.01 10*3/uL (ref 0.00–0.07)
Basophils Absolute: 0.1 10*3/uL (ref 0.0–0.1)
Basophils Relative: 1 %
Eosinophils Absolute: 0.1 10*3/uL (ref 0.0–0.5)
Eosinophils Relative: 1 %
HCT: 46.1 % — ABNORMAL HIGH (ref 36.0–46.0)
Hemoglobin: 16.4 g/dL — ABNORMAL HIGH (ref 12.0–15.0)
Immature Granulocytes: 0 %
Lymphocytes Relative: 31 %
Lymphs Abs: 1.9 10*3/uL (ref 0.7–4.0)
MCH: 32.6 pg (ref 26.0–34.0)
MCHC: 35.6 g/dL (ref 30.0–36.0)
MCV: 91.7 fL (ref 80.0–100.0)
Monocytes Absolute: 0.7 10*3/uL (ref 0.1–1.0)
Monocytes Relative: 11 %
Neutro Abs: 3.4 10*3/uL (ref 1.7–7.7)
Neutrophils Relative %: 56 %
Platelets: 359 10*3/uL (ref 150–400)
RBC: 5.03 MIL/uL (ref 3.87–5.11)
RDW: 12.8 % (ref 11.5–15.5)
WBC: 6.2 10*3/uL (ref 4.0–10.5)
nRBC: 0 % (ref 0.0–0.2)

## 2020-02-11 LAB — LACTIC ACID, PLASMA: Lactic Acid, Venous: 1.3 mmol/L (ref 0.5–1.9)

## 2020-02-11 LAB — PROTIME-INR
INR: 3 — ABNORMAL HIGH (ref 0.8–1.2)
Prothrombin Time: 31 seconds — ABNORMAL HIGH (ref 11.4–15.2)

## 2020-02-11 LAB — COMPREHENSIVE METABOLIC PANEL
ALT: 12 U/L (ref 0–44)
AST: 20 U/L (ref 15–41)
Albumin: 4.2 g/dL (ref 3.5–5.0)
Alkaline Phosphatase: 133 U/L — ABNORMAL HIGH (ref 38–126)
Anion gap: 16 — ABNORMAL HIGH (ref 5–15)
BUN: 10 mg/dL (ref 8–23)
CO2: 24 mmol/L (ref 22–32)
Calcium: 9.5 mg/dL (ref 8.9–10.3)
Chloride: 95 mmol/L — ABNORMAL LOW (ref 98–111)
Creatinine, Ser: 0.57 mg/dL (ref 0.44–1.00)
GFR calc Af Amer: >60 mL/min (ref >60)
GFR calc non Af Amer: >60 mL/min (ref >60)
Glucose, Bld: 95 mg/dL (ref 70–99)
Potassium: 3.4 mmol/L — ABNORMAL LOW (ref 3.5–5.1)
Sodium: 135 mmol/L (ref 135–145)
Total Bilirubin: 1 mg/dL (ref 0.3–1.2)
Total Protein: 8 g/dL (ref 6.5–8.1)

## 2020-02-11 MED ORDER — ACETAMINOPHEN 325 MG PO TABS
650.0000 mg | ORAL_TABLET | Freq: Four times a day (QID) | ORAL | Status: DC | PRN
  Administered 2020-02-17 – 2020-02-18 (×4): 650 mg via ORAL
  Filled 2020-02-11 (×5): qty 2

## 2020-02-11 MED ORDER — SODIUM CHLORIDE 0.9 % IV SOLN
1.0000 g | INTRAVENOUS | Status: DC
  Administered 2020-02-12 – 2020-02-14 (×3): 1 g via INTRAVENOUS
  Filled 2020-02-11 (×3): qty 1
  Filled 2020-02-11: qty 10

## 2020-02-11 MED ORDER — WARFARIN - PHYSICIAN DOSING INPATIENT
Freq: Every day | Status: DC
  Filled 2020-02-11: qty 1

## 2020-02-11 MED ORDER — MEMANTINE HCL 5 MG PO TABS
5.0000 mg | ORAL_TABLET | Freq: Two times a day (BID) | ORAL | Status: DC
  Administered 2020-02-11 – 2020-02-15 (×9): 5 mg via ORAL
  Filled 2020-02-11 (×10): qty 1

## 2020-02-11 MED ORDER — AMLODIPINE BESYLATE 5 MG PO TABS
5.0000 mg | ORAL_TABLET | Freq: Every evening | ORAL | Status: DC
  Administered 2020-02-11 – 2020-02-18 (×8): 5 mg via ORAL
  Filled 2020-02-11 (×8): qty 1

## 2020-02-11 MED ORDER — LOSARTAN POTASSIUM 50 MG PO TABS
50.0000 mg | ORAL_TABLET | Freq: Every evening | ORAL | Status: DC
  Administered 2020-02-11 – 2020-02-18 (×8): 50 mg via ORAL
  Filled 2020-02-11 (×9): qty 1

## 2020-02-11 MED ORDER — ACETAMINOPHEN 500 MG PO TABS: 1000.0000 mg | ORAL_TABLET | Freq: Three times a day (TID) | ORAL | Status: DC | PRN

## 2020-02-11 MED ORDER — ACETAMINOPHEN 650 MG RE SUPP: 650.0000 mg | Freq: Four times a day (QID) | RECTAL | Status: DC | PRN

## 2020-02-11 MED ORDER — SODIUM CHLORIDE 0.9 % IV SOLN
2.0000 g | INTRAVENOUS | Status: DC
  Administered 2020-02-11: 2 g via INTRAVENOUS
  Filled 2020-02-11: qty 20

## 2020-02-11 MED ORDER — HYDROCODONE-ACETAMINOPHEN 5-325 MG PO TABS
1.0000 | ORAL_TABLET | ORAL | Status: DC | PRN
  Administered 2020-02-11: 2 via ORAL
  Administered 2020-02-12 – 2020-02-18 (×13): 1 via ORAL
  Filled 2020-02-11 (×8): qty 1
  Filled 2020-02-11: qty 2
  Filled 2020-02-11 (×8): qty 1

## 2020-02-11 MED ORDER — ONDANSETRON HCL 4 MG/2ML IJ SOLN: 4.0000 mg | Freq: Four times a day (QID) | INTRAMUSCULAR | Status: DC | PRN

## 2020-02-11 MED ORDER — ONDANSETRON HCL 4 MG PO TABS
4.0000 mg | ORAL_TABLET | Freq: Four times a day (QID) | ORAL | Status: DC | PRN
  Administered 2020-02-11 – 2020-02-12 (×2): 4 mg via ORAL
  Filled 2020-02-11 (×2): qty 1

## 2020-02-11 MED ORDER — ACETAMINOPHEN 325 MG PO TABS
650.0000 mg | ORAL_TABLET | Freq: Once | ORAL | Status: AC
  Administered 2020-02-11: 650 mg via ORAL
  Filled 2020-02-11: qty 2

## 2020-02-11 MED ORDER — SODIUM CHLORIDE 0.9 % IV BOLUS
500.0000 mL | Freq: Once | INTRAVENOUS | Status: AC
  Administered 2020-02-11: 500 mL via INTRAVENOUS

## 2020-02-11 MED ORDER — WARFARIN SODIUM 5 MG PO TABS
5.0000 mg | ORAL_TABLET | Freq: Every evening | ORAL | Status: DC
  Filled 2020-02-11: qty 1

## 2020-02-11 NOTE — ED Notes (Signed)
Spoke with nursing supervisor. Pt has dementia. Approval for family to come and stay with pt approved.

## 2020-02-11 NOTE — ED Triage Notes (Signed)
Pt comes POV with wound on left shin. Pt got wound after coming off surgical table. Note from MD states concern for cellulitis. Left shin very red and warm to touch to knee. VSS.

## 2020-02-11 NOTE — ED Provider Notes (Signed)
[facility] Emergency Department Provider Note  ____________________________________________  Time seen: Approximately 9:44 PM  I have reviewed the triage vital signs and the nursing notes.   HISTORY  Chief Complaint Wound Check    Level 5 Caveat: Portions of the History and Physical including HPI and review of systems are unable to be completely obtained due to patient being a poor historian   HPI [name] is a 86 y.o. female with a history of dementia, atrial fibrillation, hypertension who comes the ED due to right lower extremity pain and redness.   Chart reviewed to look at recent progress notes, daughter-in-law at bedside provided additional history.  Patient apparently sustained a right leg wound in the process of having a kyphoplasty recently.  This was seen by wound care, but unfortunately the wound became devitalized and had to be debrided.  Now over the last few days symptoms are worsening.  Patient denies fever chest pain or shortness of breath.  No new trauma.     Past Medical History:  Diagnosis Date  . Atrial fibrillation ([place])   . 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 Hypertension   . Irregular heart beat   . Memory difficulty   . Meniere's disease      Patient Active Problem List   Diagnosis Date Noted  . Auditory vertigo 06/21/2015  . Irregular cardiac rhythm 06/21/2015  . Malignant neoplastic disease ([place]) 06/21/2015  . Bad memory 06/21/2015  . TI (tricuspid incompetence) 03/11/2015  . Chronic atrial fibrillation ([place]) 09/03/2014  . Edema leg 09/03/2014  . MI (mitral incompetence) 09/03/2014  . Combined fat and carbohydrate induced hyperlipemia 12/26/2013  . Meniere's disease   . Irregular heart beat   . Memory difficulty   . Hypertension   . Cancer [facility])      Past Surgical History:  Procedure Laterality Date  . BACK SURGERY    . IR GENERIC HISTORICAL  05/03/2016   IR RADIOLOGIST EVAL & MGMT 05/03/2016 Corrie Mckusick, DO [facility] INTERV RAD  . IR GENERIC HISTORICAL  02/29/2016   IR RADIOLOGIST EVAL & MGMT 02/29/2016 Marybelle Killings, MD [facility] INTERV RAD  . KYPHOPLASTY N/A 01/20/2020   Procedure: T9 AND T6 KYPHOPLASTY;  Surgeon: Hessie Knows, MD;  Location: [facility] ORS;  Service: Orthopedics;  Laterality: N/A;  . MASTOIDECTOMY    . menieres surg    . Squamous cell excised  2010   Ear  . TONSILLECTOMY       Prior to Admission medications   Medication Sig Start Date End Date Taking? Authorizing Provider  acetaminophen (TYLENOL) 500 MG tablet Take 1,000 mg by mouth in the morning and at bedtime.    [provider]  amLODipine (NORVASC) 5 MG tablet Take 5 mg by mouth every evening.     [provider]  losartan (COZAAR) 50 MG tablet Take 50 mg by mouth every evening. 01/03/20   [provider]  memantine (NAMENDA) 5 MG tablet Take 5 mg by mouth 2 (two) times daily. 12/02/19   [provider]  warfarin (COUMADIN) 5 MG tablet Take 5 mg by mouth every evening.     [provider]     Allergies Patient has no known allergies.   Family History  Problem Relation Age of Onset  . Hypertension Mother   . Hypertension Father   . Heart disease Father     Social History Social History   Tobacco Use  . Smoking status: Former  Smoker  . Smokeless tobacco: Never Used  Substance Use Topics  . Alcohol use: Yes    Alcohol/week: 7.0 standard drinks    Types: 7 Standard drinks or equivalent per week  . Drug use: Never    Review of Systems Level 5 Caveat: Portions of the History and Physical including HPI and review of systems are unable to be completely obtained due to patient being a poor historian   Constitutional:   No known fever.  ENT:   No rhinorrhea. Cardiovascular:   No chest pain or syncope. Respiratory:   No dyspnea or cough. Gastrointestinal:   Negative for abdominal pain, vomiting and diarrhea.  Musculoskeletal: Right leg pain as  above ____________________________________________   PHYSICAL EXAM:  VITAL SIGNS: ED Triage Vitals  Enc Vitals Group     BP 02/11/20 1710 (!) 162/101     Pulse Rate 02/11/20 1710 (!) 107     Resp 02/11/20 1710 18     Temp 02/11/20 1710 98.7 F (37.1 C)     Temp Source 02/11/20 1710 Oral     SpO2 02/11/20 1710 96 %     Weight 02/11/20 1711 90 lb (40.8 kg)     Height 02/11/20 1711 5' (1.524 m)     Head Circumference --      Peak Flow --      Pain Score 02/11/20 1711 0     Pain Loc --      Pain Edu? --      Excl. in [place]? --     Vital signs reviewed, nursing assessments reviewed.   Constitutional:   Alert and oriented to self. Non-toxic appearance. Eyes:   Conjunctivae are normal. EOMI. PERRL. ENT      Head:   Normocephalic and atraumatic.      Nose:   No congestion/rhinnorhea.       Mouth/Throat:   MMM, no pharyngeal erythema. No peritonsillar mass.       Neck:   No meningismus. Full ROM. Hematological/Lymphatic/Immunilogical:   No cervical lymphadenopathy. Cardiovascular:   Tachycardia heart rate 105. Symmetric bilateral radial and DP pulses.  No murmurs. Cap refill less than 2 seconds. Respiratory:   Normal respiratory effort without tachypnea/retractions. Breath sounds are clear and equal bilaterally. No wheezes/rales/rhonchi. Gastrointestinal:   Soft and nontender. Non distended. There is no CVA tenderness.  No rebound, rigidity, or guarding. Musculoskeletal:   Normal range of motion in all extremities. No joint effusions.  There is a 4 cm open wound on the right anterior shin exposing subcutaneous fat with surrounding erythema warmth and tenderness.  No purulent drainage, no crepitus.  Compartments are soft. Neurologic:   Normal speech and language.  Motor grossly intact. No acute focal neurologic deficits are appreciated.  Skin:    Skin is warm, dry and intact. No rash noted.  No petechiae, purpura, or bullae.  ____________________________________________    LABS  (pertinent positives/negatives) (all labs ordered are listed, but only abnormal results are displayed) Labs Reviewed  COMPREHENSIVE METABOLIC PANEL - Abnormal; Notable for the following components:      Result Value   Potassium 3.4 (*)    Chloride 95 (*)    Alkaline Phosphatase 133 (*)    Anion gap 16 (*)    All other components within normal limits  CBC WITH DIFFERENTIAL/PLATELET - Abnormal; Notable for the following components:   Hemoglobin 16.4 (*)    HCT 46.1 (*)    All other components within normal limits  PROTIME-INR - Abnormal; Notable for  the following components:   Prothrombin Time 31.0 (*)    INR 3.0 (*)    All other components within normal limits  CULTURE, BLOOD (ROUTINE X 2)  CULTURE, BLOOD (ROUTINE X 2)  SARS CORONAVIRUS 2 (TAT 6-24 HRS)  LACTIC ACID, PLASMA  URINALYSIS, COMPLETE (UACMP) WITH MICROSCOPIC   ____________________________________________   EKG    ____________________________________________    RADIOLOGY  DG Tibia/Fibula Right  Result Date: 02/11/2020 CLINICAL DATA:  Right leg pain over necrotic wound that resulted from blunt trauma. EXAM: RIGHT TIBIA AND FIBULA - 2 VIEW COMPARISON:  None. FINDINGS: The cortical margins of the tibia and fibular intact. There is no evidence of fracture or other focal bone lesions. No periosteal reaction or bony destructive change. Generalized soft tissue edema without soft tissue air or radiopaque foreign body. Vascular calcifications are seen. IMPRESSION: Soft tissue edema without soft tissue air or radiopaque foreign body. No radiographic findings of osteomyelitis. Electronically Signed   By: Keith Rake M.D.   On: 02/11/2020 21:15    ____________________________________________   PROCEDURES Procedures  ____________________________________________    CLINICAL IMPRESSION / ASSESSMENT AND PLAN / ED COURSE  Medications ordered in the ED: Medications  cefTRIAXone (ROCEPHIN) 2 g in sodium chloride 0.9  % 100 mL IVPB (2 g Intravenous New Bag/Given 02/11/20 2117)  amLODipine (NORVASC) tablet 5 mg (has no administration in time range)  losartan (COZAAR) tablet 50 mg (has no administration in time range)  memantine (NAMENDA) tablet 5 mg (has no administration in time range)  warfarin (COUMADIN) tablet 5 mg (has no administration in time range)  acetaminophen (TYLENOL) tablet 1,000 mg (has no administration in time range)  sodium chloride 0.9 % bolus 500 mL (500 mLs Intravenous New Bag/Given 02/11/20 2116)  acetaminophen (TYLENOL) tablet 650 mg (650 mg Oral Given 02/11/20 2119)    Pertinent labs & imaging results that were available during my care of the patient were reviewed by me and considered in my medical decision making (see chart for details).   AALEYAH [name] was evaluated in Emergency Department on 02/11/2020 for the symptoms described in the history of present illness. She was evaluated in the context of the global COVID-19 pandemic, which necessitated consideration that the patient might be at risk for infection with the SARS-CoV-2 virus that causes COVID-19. Institutional protocols and algorithms that pertain to the evaluation of patients at risk for COVID-19 are in a state of rapid change based on information released by regulatory bodies including the CDC and federal and state organizations. These policies and algorithms were followed during the patient's care in the ED.   Patient presents with open wound on the right leg that is becoming increasingly painful and erythematous.  Clinically this is consistent with cellulitis.  She is tachycardic but not septic.  Labs are overall unremarkable with therapeutic INR on Coumadin.  Lactate is normal.  Will admit for further management.  IV ceftriaxone ordered.      ____________________________________________   FINAL CLINICAL IMPRESSION(S) / ED DIAGNOSES    Final diagnoses:  Cellulitis of right lower extremity  Dementia   ED  Discharge Orders    None      Portions of this note were generated with dragon dictation software. Dictation errors may occur despite best attempts at proofreading.   Carrie Mew, MD 02/11/20 2151

## 2020-02-11 NOTE — ED Notes (Signed)
Blood cultures x2, repeat lactic, sst x2 save tubes sent to lab along w covid swab.

## 2020-02-11 NOTE — H&P (Signed)
History and Physical    [name] [id number] DOB: [birth year] DOA: 02/11/2020  PCP: Derinda Late, MD   Patient coming from: Home  I have personally briefly reviewed patient's old medical records in [place]  Chief Complaint: Pain and redness around ulcer right lower extremity  HPI: [name] is a 86 y.o. female with medical history significant for chronic atrial fibrillation on Coumadin, dementia, hypertension as well as severe tricuspid and mitral insufficiency, followed by cardiology who was sent to the emergency room by her wound care doctor for further management of a pneumatic wound on her right lower leg, not improving with outpatient surgical debridements.  Patient sustained the wound after hitting her right shin on a hospital bed during a kyphoplasty procedure about 3 weeks prior.  She was referred to wound care where she underwent debridement on 02/04/2020 however in spite of debridement she developed redness, pain and swelling in the surrounding area.  Most of the history is taken from her daughter-in-law who is at the bedside due to patient's dementia.  She states that her mother-in-law's pain has been increasing and whereas usually she can ambulate without assistance lately she has been having to use a walker.  She has had no fever, chills and no falls. ED Course: In the emergency room she was afebrile, BP 162/101, otherwise normal vitals.  BC normal and otherwise blood work unremarkable.  She had an x-ray of the right lower leg that showed edema but no other abnormality.  Patient was started on Rocephin.  Hospitalist consulted for admission.  Review of Systems: Unreliable due to dementia.  Eventually as per HPI on 10 point review as to daughter-in-law at bedside  Past Medical History:  Diagnosis Date  . Atrial fibrillation ([place])   . 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 Hypertension   . Irregular heart beat   . Memory difficulty   . Meniere's disease      Past Surgical History:  Procedure Laterality Date  . BACK SURGERY    . IR GENERIC HISTORICAL  05/03/2016   IR RADIOLOGIST EVAL & MGMT 05/03/2016 Corrie Mckusick, DO [facility] INTERV RAD  . IR GENERIC HISTORICAL  02/29/2016   IR RADIOLOGIST EVAL & MGMT 02/29/2016 Marybelle Killings, MD [facility] INTERV RAD  . KYPHOPLASTY N/A 01/20/2020   Procedure: T9 AND T6 KYPHOPLASTY;  Surgeon: Hessie Knows, MD;  Location: [facility] ORS;  Service: Orthopedics;  Laterality: N/A;  . MASTOIDECTOMY    . menieres surg    . Squamous cell excised  2010   Ear  . TONSILLECTOMY       reports that she has quit smoking. She has never used smokeless tobacco. She reports current alcohol use of about 7.0 standard drinks of alcohol per week. She reports that she does not use drugs.  No Known Allergies  Family History  Problem Relation Age of Onset  . Hypertension Mother   . Hypertension Father   . Heart disease Father      Prior to Admission medications   Medication Sig Start Date End Date Taking? Authorizing Provider  acetaminophen (TYLENOL) 500 MG tablet Take 1,000 mg by mouth in the morning and at bedtime.    [provider]  amLODipine (NORVASC) 5 MG tablet Take 5 mg by mouth every evening.     [provider]  losartan (COZAAR) 50 MG tablet Take 50 mg by mouth every evening. 01/03/20   [provider]  memantine (NAMENDA) 5 MG  tablet Take 5 mg by mouth 2 (two) times daily. 12/02/19   [provider]  warfarin (COUMADIN) 5 MG tablet Take 5 mg by mouth every evening.     [provider]    Physical Exam: Vitals:   02/11/20 1710 02/11/20 1711  BP: (!) 162/101   Pulse: (!) 107   Resp: 18   Temp: 98.7 F (37.1 C)   TempSrc: Oral   SpO2: 96%   Weight:  40.8 kg  Height:  5' (1.524 m)     Vitals:   02/11/20 1710 02/11/20 1711  BP: (!) 162/101   Pulse: (!) 107   Resp: 18   Temp: 98.7 F (37.1 C)   TempSrc: Oral   SpO2: 96%   Weight:  40.8 kg  Height:  5' (1.524 m)     Constitutional: Alert and awake, oriented x2, not in any acute distress. Eyes: PERLA, EOMI, irises appear normal, anicteric sclera,  ENMT: external ears and nose appear normal, normal hearing             Lips appears normal Neck: neck appears normal, no masses, normal ROM, no thyromegaly, no JVD  CVS: S1-S2 clear, no murmur rubs or gallops,  , no carotid bruits, pedal pulses palpable, No LE edema Respiratory:  clear to auscultation bilaterally, no wheezing, rales or rhonchi. Respiratory effort normal. No accessory muscle use.  Abdomen: soft nontender, nondistended, normal bowel sounds, no hepatosplenomegaly, no hernias Musculoskeletal: : no cyanosis, clubbing , no contractures or atrophy Neuro: Cranial nerves II-XII intact, sensation, reflexes normal, strength Psych:  stable mood and affect,  Skin: 5 x 4.7 x 0.3 ulcer on anterolateral aspect right shin about 6 cm above ankle.  Necrotic borders.  Serosanguineous oozing.   Labs on Admission: I have personally reviewed following labs and imaging studies  CBC: Recent Labs  Lab 02/11/20 1716  WBC 6.2  NEUTROABS 3.4  HGB 16.4*  HCT 46.1*  MCV 91.7  PLT [id number]   Basic Metabolic Panel: Recent Labs  Lab 02/11/20 1716  NA 135  K 3.4*  CL 95*  CO2 24  GLUCOSE 95  BUN 10  CREATININE 0.57  CALCIUM 9.5   GFR: Estimated Creatinine Clearance: 29.5 mL/min (by C-G formula based on SCr of 0.57 mg/dL). Liver Function Tests: Recent Labs  Lab 02/11/20 1716  AST 20  ALT 12  ALKPHOS 133*  BILITOT 1.0  PROT 8.0  ALBUMIN 4.2   No results for input(s): LIPASE, AMYLASE in the last 168 hours. No results for input(s): AMMONIA in the last 168 hours. Coagulation Profile: Recent Labs  Lab 02/11/20 1716  INR 3.0*   Cardiac Enzymes: No results for input(s): CKTOTAL, CKMB, CKMBINDEX, TROPONINI in the last 168 hours. BNP (last 3 results) No results for input(s): PROBNP in the last 8760 hours. HbA1C: No results for input(s): HGBA1C in  the last 72 hours. CBG: No results for input(s): GLUCAP in the last 168 hours. Lipid Profile: No results for input(s): CHOL, HDL, LDLCALC, TRIG, CHOLHDL, LDLDIRECT in the last 72 hours. Thyroid Function Tests: No results for input(s): TSH, T4TOTAL, FREET4, T3FREE, THYROIDAB in the last 72 hours. Anemia Panel: No results for input(s): VITAMINB12, FOLATE, FERRITIN, TIBC, IRON, RETICCTPCT in the last 72 hours. Urine analysis:    Component Value Date/Time   COLORURINE YELLOW (A) 08/23/2015 1530   APPEARANCEUR CLEAR (A) 08/23/2015 1530   LABSPEC 1.010 08/23/2015 1530   PHURINE 6.0 08/23/2015 1530   GLUCOSEU NEGATIVE 08/23/2015 1530   HGBUR NEGATIVE  08/23/2015 [place] 08/23/2015 [place] 08/23/2015 [place] 08/23/2015 1530   UROBILINOGEN 1 11/06/2012 1226   NITRITE NEGATIVE 08/23/2015 [place] 08/23/2015 1530    Radiological Exams on Admission: DG Tibia/Fibula Right  Result Date: 02/11/2020 CLINICAL DATA:  Right leg pain over necrotic wound that resulted from blunt trauma. EXAM: RIGHT TIBIA AND FIBULA - 2 VIEW COMPARISON:  None. FINDINGS: The cortical margins of the tibia and fibular intact. There is no evidence of fracture or other focal bone lesions. No periosteal reaction or bony destructive change. Generalized soft tissue edema without soft tissue air or radiopaque foreign body. Vascular calcifications are seen. IMPRESSION: Soft tissue edema without soft tissue air or radiopaque foreign body. No radiographic findings of osteomyelitis. Electronically Signed   By: Keith Rake M.D.   On: 02/11/2020 21:15    EKG: Independently reviewed.   Assessment/Plan Principal Problem:   Cellulitis of right leg   Ulcer of right leg, with fat layer exposed ([place]) -Patient presenting with necrotic wound and cellulitis failing outpatient treatment with wound care -IV Rocephin -Keep leg elevated -We will hold Coumadin in  case debridement is required.  INR currently 3 -Surgical consult    Hypertension -Continue home amlodipine and losartan    Chronic atrial fibrillation (HCC) -INR 3, therapeutic -Hold warfarin pending surgical recommendation in the a.m. -SCD on unaffected leg for DVT prophylaxis -Does not currently on rate control agents    MI (mitral incompetence)   TI (tricuspid incompetence) -Follows with cardiology, Dr. Nehemiah Massed -No acute disease    Dementia without behavioral disturbance ([place]) -Continue Namenda    DVT prophylaxis: Warfarin on hold.  SCD on unaffected leg Code Status: full code  Family Communication: Daughter-in-law, Jaylani Roussell at bedside Disposition Plan: Back to previous home environment Consults called: Surgery Status: Observation    Athena Masse MD [facility]     02/11/2020, 10:09 PM

## 2020-02-11 NOTE — ED Notes (Signed)
Pt ambulated to toilet in room w RN assistance. Due to pain in leg pt states she may use bedpan from now on. Pt given crackers and ice water.

## 2020-02-12 ENCOUNTER — Encounter: Payer: Self-pay | Admitting: Internal Medicine

## 2020-02-12 DIAGNOSIS — E871 Hypo-osmolality and hyponatremia: Secondary | ICD-10-CM | POA: Diagnosis not present

## 2020-02-12 DIAGNOSIS — Z7901 Long term (current) use of anticoagulants: Secondary | ICD-10-CM | POA: Diagnosis not present

## 2020-02-12 DIAGNOSIS — F0390 Unspecified dementia without behavioral disturbance: Secondary | ICD-10-CM | POA: Diagnosis present

## 2020-02-12 DIAGNOSIS — I272 Pulmonary hypertension, unspecified: Secondary | ICD-10-CM | POA: Diagnosis present

## 2020-02-12 DIAGNOSIS — H8109 Meniere's disease, unspecified ear: Secondary | ICD-10-CM | POA: Diagnosis present

## 2020-02-12 DIAGNOSIS — L03115 Cellulitis of right lower limb: Secondary | ICD-10-CM | POA: Diagnosis present

## 2020-02-12 DIAGNOSIS — I482 Chronic atrial fibrillation, unspecified: Secondary | ICD-10-CM | POA: Diagnosis present

## 2020-02-12 DIAGNOSIS — I4891 Unspecified atrial fibrillation: Secondary | ICD-10-CM | POA: Diagnosis not present

## 2020-02-12 DIAGNOSIS — I10 Essential (primary) hypertension: Secondary | ICD-10-CM | POA: Diagnosis present

## 2020-02-12 DIAGNOSIS — Z87891 Personal history of nicotine dependence: Secondary | ICD-10-CM | POA: Diagnosis not present

## 2020-02-12 DIAGNOSIS — Z20822 Contact with and (suspected) exposure to covid-19: Secondary | ICD-10-CM | POA: Diagnosis present

## 2020-02-12 DIAGNOSIS — L97812 Non-pressure chronic ulcer of other part of right lower leg with fat layer exposed: Secondary | ICD-10-CM | POA: Diagnosis present

## 2020-02-12 DIAGNOSIS — E876 Hypokalemia: Secondary | ICD-10-CM | POA: Diagnosis present

## 2020-02-12 DIAGNOSIS — S8992XA Unspecified injury of left lower leg, initial encounter: Secondary | ICD-10-CM | POA: Diagnosis not present

## 2020-02-12 DIAGNOSIS — E861 Hypovolemia: Secondary | ICD-10-CM | POA: Diagnosis not present

## 2020-02-12 DIAGNOSIS — I081 Rheumatic disorders of both mitral and tricuspid valves: Secondary | ICD-10-CM | POA: Diagnosis present

## 2020-02-12 DIAGNOSIS — S81811A Laceration without foreign body, right lower leg, initial encounter: Secondary | ICD-10-CM | POA: Diagnosis not present

## 2020-02-12 DIAGNOSIS — Z79899 Other long term (current) drug therapy: Secondary | ICD-10-CM | POA: Diagnosis not present

## 2020-02-12 DIAGNOSIS — X58XXXA Exposure to other specified factors, initial encounter: Secondary | ICD-10-CM | POA: Diagnosis not present

## 2020-02-12 DIAGNOSIS — Z8249 Family history of ischemic heart disease and other diseases of the circulatory system: Secondary | ICD-10-CM | POA: Diagnosis not present

## 2020-02-12 LAB — BASIC METABOLIC PANEL
Anion gap: 10 (ref 5–15)
BUN: 9 mg/dL (ref 8–23)
CO2: 28 mmol/L (ref 22–32)
Calcium: 8.5 mg/dL — ABNORMAL LOW (ref 8.9–10.3)
Chloride: 97 mmol/L — ABNORMAL LOW (ref 98–111)
Creatinine, Ser: 0.49 mg/dL (ref 0.44–1.00)
GFR calc Af Amer: >60 mL/min (ref >60)
GFR calc non Af Amer: >60 mL/min (ref >60)
Glucose, Bld: 107 mg/dL — ABNORMAL HIGH (ref 70–99)
Potassium: 3.3 mmol/L — ABNORMAL LOW (ref 3.5–5.1)
Sodium: 135 mmol/L (ref 135–145)

## 2020-02-12 LAB — CBC
HCT: 40.6 % (ref 36.0–46.0)
Hemoglobin: 13.9 g/dL (ref 12.0–15.0)
MCH: 32.3 pg (ref 26.0–34.0)
MCHC: 34.2 g/dL (ref 30.0–36.0)
MCV: 94.4 fL (ref 80.0–100.0)
Platelets: 310 10*3/uL (ref 150–400)
RBC: 4.3 MIL/uL (ref 3.87–5.11)
RDW: 12.6 % (ref 11.5–15.5)
WBC: 5 10*3/uL (ref 4.0–10.5)
nRBC: 0 % (ref 0.0–0.2)

## 2020-02-12 LAB — PROTIME-INR
INR: 3.3 — ABNORMAL HIGH (ref 0.8–1.2)
INR: 2.9 — ABNORMAL HIGH (ref 0.8–1.2)
Prothrombin Time: 33.2 seconds — ABNORMAL HIGH (ref 11.4–15.2)
Prothrombin Time: 30.5 seconds — ABNORMAL HIGH (ref 11.4–15.2)

## 2020-02-12 LAB — SARS CORONAVIRUS 2 (TAT 6-24 HRS): SARS Coronavirus 2: NEGATIVE

## 2020-02-12 MED ORDER — WARFARIN SODIUM 5 MG PO TABS
5.0000 mg | ORAL_TABLET | Freq: Once | ORAL | Status: DC
  Filled 2020-02-12: qty 1

## 2020-02-12 MED ORDER — SILVER SULFADIAZINE 1 % EX CREA
TOPICAL_CREAM | Freq: Two times a day (BID) | CUTANEOUS | Status: DC
  Administered 2020-02-12: 1 via TOPICAL
  Filled 2020-02-12: qty 20
  Filled 2020-02-12 (×3): qty 85

## 2020-02-12 MED ORDER — ONDANSETRON HCL 4 MG PO TABS
4.0000 mg | ORAL_TABLET | Freq: Four times a day (QID) | ORAL | Status: DC | PRN
  Administered 2020-02-17 (×3): 4 mg via ORAL
  Filled 2020-02-12 (×3): qty 1

## 2020-02-12 MED ORDER — ONDANSETRON HCL 4 MG/2ML IJ SOLN
8.0000 mg | Freq: Four times a day (QID) | INTRAMUSCULAR | Status: DC | PRN
  Administered 2020-02-12 – 2020-02-18 (×10): 8 mg via INTRAVENOUS
  Filled 2020-02-12 (×11): qty 4

## 2020-02-12 MED ORDER — VANCOMYCIN HCL 750 MG/150ML IV SOLN: 750.0000 mg | INTRAVENOUS | Status: DC

## 2020-02-12 MED ORDER — WARFARIN SODIUM 3 MG PO TABS
3.0000 mg | ORAL_TABLET | Freq: Once | ORAL | Status: AC
  Administered 2020-02-12: 3 mg via ORAL
  Filled 2020-02-12: qty 1

## 2020-02-12 MED ORDER — SODIUM CHLORIDE 0.9 % IV SOLN: INTRAVENOUS | Status: AC

## 2020-02-12 MED ORDER — VANCOMYCIN HCL IN DEXTROSE 1-5 GM/200ML-% IV SOLN
1000.0000 mg | Freq: Once | INTRAVENOUS | Status: AC
  Administered 2020-02-12: 1000 mg via INTRAVENOUS
  Filled 2020-02-12: qty 200

## 2020-02-12 MED ORDER — POTASSIUM CHLORIDE CRYS ER 20 MEQ PO TBCR
20.0000 meq | EXTENDED_RELEASE_TABLET | Freq: Two times a day (BID) | ORAL | Status: AC
  Administered 2020-02-12 (×2): 20 meq via ORAL
  Filled 2020-02-12 (×2): qty 1

## 2020-02-12 MED ORDER — WARFARIN - PHARMACIST DOSING INPATIENT: Freq: Every day | Status: DC

## 2020-02-12 NOTE — Consult Note (Signed)
[facility] Nurse Consult Note: Order for consult to right shin nonhealing wound.  Surgery team has consulted and provided topical recommendations of silvadene cream daily by the bedside RN.  No further WOC needs at this time.   Reason for Consult:Nonhealing trauma wound to right anterior lower leg.  Photo in surgery note  Wound type:trauma nonhealing.  Negative for osteomyelitis Pressure Injury POA: NA Measurement: 5 cm x 4 cm with devitalized tissue to wound bed Wound [id number] and tenderness Drainage (amount, consistency, odor) minimal serosanguinous  Tender to touch Periwound:Edema and erythema Dressing procedure/placement/frequency: Per surgeon recommendations, silvadene cream Will not follow at this time.  Please re-consult if needed.  Domenic Moras MSN, RN, FNP-BC CWON Wound, Ostomy, Continence Nurse Pager [phone]

## 2020-02-12 NOTE — Progress Notes (Signed)
[facility] PROGRESS NOTE    Progress Note  [name]  [id number] DOB: 1926/09/13 DOA: 02/11/2020 PCP: Derinda Late, MD     Brief Narrative:   [name] is an 86 y.o. female past medical history significant for chronic atrial fibrillation on Coumadin, dementia essential hypertension severe tricuspid and mitral insufficiency followed by cardiology was sent to the ED by wound care doctor, the patient's is due to wound to the right chin 3 weeks prior to admission which got infected and was manages an outpatient with surgical debridement on 02/04/2020 however despite this it became red painful and swollen, most of the history was obtained by the daughter-in-law as patient has advanced dementia.  Assessment/Plan:   Cellulitis of right leg with fat layer exposed: Patient presented with a necrotic wound and cellulitis that failed outpatient treatment at wound care center. She was started on IV Rocephin, will add IV Vanco as she failed outpatient antibiotic regimen and her wound has been progressing despiteoral antibiotics and wound care. Consult wound care.  Essential hypertension: Continue Norvasc and losartan.  Chronic atrial fibrillation: She is not on any rate controlling agents resume Coumadin per pharmacy.  Tricuspid and mitral insufficiency: Noted.  Advanced dementia without behavioral disturbances: Continue Namenda.   DVT prophylaxis: Warfarin Family Communication: Daughter-in-law Status is: Inpatient  The patient will require care spanning > 2 midnights and should be moved to inpatient because: IV treatments appropriate due to intensity of illness or inability to take PO and Inpatient level of care appropriate due to severity of illness  Dispo: The patient is from: Home              Anticipated d/c is to: Home              Anticipated d/c date is: 2 days              Patient currently is not medically stable to d/c.    Code Status:     Code  Status Orders  (From admission, onward)         Start     Ordered   02/11/20 2207  Full code  Continuous     02/11/20 2208        Code Status History    This patient has a current code status but no historical code status.   Advance Care Planning Activity    Advance Directive Documentation     Most Recent Value  Type of Advance Directive  Healthcare Power of Attorney  Pre-existing out of facility DNR order (yellow form or pink MOST form)  -  "MOST" Form in Place?  -        IV Access:    Peripheral IV   Procedures and diagnostic studies:   DG Tibia/Fibula Right  Result Date: 02/11/2020 CLINICAL DATA:  Right leg pain over necrotic wound that resulted from blunt trauma. EXAM: RIGHT TIBIA AND FIBULA - 2 VIEW COMPARISON:  None. FINDINGS: The cortical margins of the tibia and fibular intact. There is no evidence of fracture or other focal bone lesions. No periosteal reaction or bony destructive change. Generalized soft tissue edema without soft tissue air or radiopaque foreign body. Vascular calcifications are seen. IMPRESSION: Soft tissue edema without soft tissue air or radiopaque foreign body. No radiographic findings of osteomyelitis. Electronically Signed   By: Keith Rake M.D.   On: 02/11/2020 21:15     Medical Consultants:    None.  Anti-Infectives:   rocephin and vancomycin  Subjective:    [name] she relates significant nausea this morning.  Objective:    Vitals:   02/11/20 2200 02/11/20 2230 02/11/20 2330 02/12/20 0140  BP: (!) 183/113 (!) 200/100 (!) 140/91 (!) 145/87  Pulse: 96 89 91 85  Resp:  18  17  Temp:    97.8 F (36.6 C)  TempSrc:    Oral  SpO2: 96% 96% (!) 89% 90%  Weight:      Height:       SpO2: 90 %   Intake/Output Summary (Last 24 hours) at 02/12/2020 0841 Last data filed at 02/11/2020 2231 Gross per 24 hour  Intake 500 ml  Output -  Net 500 ml   Filed Weights   02/11/20 1711  Weight: 40.8 kg    Exam:  General exam: In no acute distress. Respiratory system: Good air movement and clear to auscultation. Cardiovascular system: S1 & S2 heard, RRR. No JVD. Gastrointestinal system: Abdomen is nondistended, soft and nontender.  Extremities: No pedal edema. Skin: Wrapping of the right chin, as the surgeon had evaluated it this morning and the nurse just wrapped it  Data Reviewed:    Labs: Basic Metabolic Panel: Recent Labs  Lab 02/11/20 1716 02/12/20 0443  NA 135 135  K 3.4* 3.3*  CL 95* 97*  CO2 24 28  GLUCOSE 95 107*  BUN 10 9  CREATININE 0.57 0.49  CALCIUM 9.5 8.5*   GFR Estimated Creatinine Clearance: 29.5 mL/min (by C-G formula based on SCr of 0.49 mg/dL). Liver Function Tests: Recent Labs  Lab 02/11/20 1716  AST 20  ALT 12  ALKPHOS 133*  BILITOT 1.0  PROT 8.0  ALBUMIN 4.2   No results for input(s): LIPASE, AMYLASE in the last 168 hours. No results for input(s): AMMONIA in the last 168 hours. Coagulation profile Recent Labs  Lab 02/11/20 1716 02/12/20 0443  INR 3.0* 3.3*   COVID-19 Labs  No results for input(s): DDIMER, FERRITIN, LDH, CRP in the last 72 hours.  Lab Results  Component Value Date   [place] NEGATIVE 01/19/2020    CBC: Recent Labs  Lab 02/11/20 1716 02/12/20 0443  WBC 6.2 5.0  NEUTROABS 3.4  --   HGB 16.4* 13.9  HCT 46.1* 40.6  MCV 91.7 94.4  PLT 359 310   Cardiac Enzymes: No results for input(s): CKTOTAL, CKMB, CKMBINDEX, TROPONINI in the last 168 hours. BNP (last 3 results) No results for input(s): PROBNP in the last 8760 hours. CBG: No results for input(s): GLUCAP in the last 168 hours. D-Dimer: No results for input(s): DDIMER in the last 72 hours. Hgb A1c: No results for input(s): HGBA1C in the last 72 hours. Lipid Profile: No results for input(s): CHOL, HDL, LDLCALC, TRIG, CHOLHDL, LDLDIRECT in the last 72 hours. Thyroid function studies: No results for input(s): TSH, T4TOTAL, T3FREE, THYROIDAB in the last 72  hours.  Invalid input(s): FREET3 Anemia work up: No results for input(s): VITAMINB12, FOLATE, FERRITIN, TIBC, IRON, RETICCTPCT in the last 72 hours. Sepsis Labs: Recent Labs  Lab 02/11/20 1716 02/12/20 0443  WBC 6.2 5.0  LATICACIDVEN 1.3  --    Microbiology Recent Results (from the past 240 hour(s))  Culture, blood (Routine x 2)     Status: None (Preliminary result)   Collection Time: 02/11/20  5:16 PM   Specimen: BLOOD  Result Value Ref Range Status   Specimen Description BLOOD BLOOD RIGHT FOREARM  Final   Special Requests   Final    BOTTLES DRAWN AEROBIC  AND ANAEROBIC Blood Culture adequate volume   Culture   Final    NO GROWTH < 24 HOURS Performed at [facility], [address] [place], [place] [address]    Report Status PENDING  Incomplete  Culture, blood (Routine x 2)     Status: None (Preliminary result)   Collection Time: 02/11/20  5:16 PM   Specimen: BLOOD  Result Value Ref Range Status   Specimen Description   Final    BLOOD Blood Culture results may not be optimal due to an inadequate volume of blood received in culture bottles   Special Requests   Final    BOTTLES DRAWN AEROBIC AND ANAEROBIC RIGHT ANTECUBITAL   Culture   Final    NO GROWTH < 12 HOURS Performed at [facility], [address], [place], [place] [address]    Report Status PENDING  Incomplete     Medications:   . amLODipine  5 mg Oral QPM  . losartan  50 mg Oral QPM  . memantine  5 mg Oral BID   Continuous Infusions: . cefTRIAXone (ROCEPHIN)  IV        LOS: 0 days   Charlynne Cousins  [facility]  02/12/2020, 8:41 AM

## 2020-02-12 NOTE — Progress Notes (Signed)
BURKE, HEISE ([id number]) Visit Report for 02/11/2020 Arrival Information Details [name], [name]. Date of Service: 02/11/2020 3:15 PM Medical Record Number: [id number] Patient Account Number: [id number] Date of Birth/Sex: 05-19-1926 (86 y.o. F) Treating RN: [name] Primary Care [name]: [name] Other Clinician: Referring [name]: [name] Treating [name]: [name] in Treatment: 2 Visit Information History Since Last Visit Added or deleted any medications: No Patient Arrived: Walker Any new allergies or adverse reactions: No Arrival Time: 15:20 Had a fall or experienced change in No Accompanied By: husband activities of daily living that may affect Transfer Assistance: None risk of falls: Patient Identification Verified: Yes Signs or symptoms of abuse/neglect since last visito No Secondary Verification Process Completed: Yes Hospitalized since last visit: No Patient Has Alerts: Yes Implantable device outside of the clinic excluding No Patient Alerts: Patient on Blood Thinner cellular tissue based products placed in the center warfarin since last visit: Has Dressing in Place as Prescribed: Yes Has Compression in Place as Prescribed: Yes Pain Present Now: Yes Electronic Signature(s) Signed: 02/11/2020 4:35:46 PM By: [name] RCP, RRT, CHT Entered By: [name] on 02/11/2020 15:20:42 [name] ([id number]) -------------------------------------------------------------------------------- Clinic Level of Care Assessment Details [name]. Date of Service: 02/11/2020 3:15 PM Medical Record Number: [id number] Patient Account Number: [id number] Date of Birth/Sex: 1926-08-14 (86 y.o. F) Treating RN: [name] Primary Care Brailyn Killion: [name] Other Clinician: Referring [name]: [name] Treating [name]: [name]  in Treatment: 2 Clinic Level of Care Assessment Items TOOL 4 Quantity Score []  - Use when only an EandM is performed on FOLLOW-UP visit 0 ASSESSMENTS - Nursing Assessment / Reassessment X - Reassessment of Co-morbidities (includes updates in patient status) 1 10 X- 1 5 Reassessment of Adherence to Treatment Plan ASSESSMENTS - Wound and Skin Assessment / Reassessment X - Simple Wound Assessment / Reassessment - one wound 1 5 []  - 0 Complex Wound Assessment / Reassessment - multiple wounds []  - 0 Dermatologic / Skin Assessment (not related to wound area) ASSESSMENTS - Focused Assessment []  - Circumferential Edema Measurements - multi extremities 0 []  - 0 Nutritional Assessment / Counseling / Intervention []  - 0 Lower Extremity Assessment (monofilament, tuning fork, pulses) []  - 0 Peripheral Arterial Disease Assessment (using hand held doppler) ASSESSMENTS - Ostomy and/or Continence Assessment and Care []  - Incontinence Assessment and Management 0 []  - 0 Ostomy Care Assessment and Management (repouching, etc.) PROCESS - Coordination of Care []  - Simple Patient / Family Education for ongoing care 0 X- 1 20 Complex (extensive) Patient / Family Education for ongoing care X- 1 10 Staff obtains Programmer, systems, Records, Test Results / Process Orders []  - 0 Staff telephones HHA, Nursing Homes / Clarify orders / etc []  - 0 Routine Transfer to another Facility (non-emergent condition) []  - 0 Routine Hospital Admission (non-emergent condition) []  - 0 New Admissions / Biomedical engineer / Ordering NPWT, Apligraf, etc. X- 1 20 Emergency Hospital Admission (emergent condition) X- 1 10 Simple Discharge Coordination []  - 0 Complex (extensive) Discharge Coordination PROCESS - Special Needs []  - Pediatric / Minor Patient Management 0 []  - 0 Isolation Patient Management []  - 0 Hearing / Language / Visual special needs []  - 0 Assessment of Community assistance (transportation, D/C  planning, etc.) [name], [name] ([id number]) []  - 0 Additional assistance / Altered mentation []  - 0 Support Surface(s) Assessment (bed, cushion, seat, etc.) INTERVENTIONS - Wound Cleansing / Measurement X -  Simple Wound Cleansing - one wound 1 5 []  - 0 Complex Wound Cleansing - multiple wounds X- 1 5 Wound Imaging (photographs - any number of wounds) []  - 0 Wound Tracing (instead of photographs) X- 1 5 Simple Wound Measurement - one wound []  - 0 Complex Wound Measurement - multiple wounds INTERVENTIONS - Wound Dressings []  - Small Wound Dressing one or multiple wounds 0 []  - 0 Medium Wound Dressing one or multiple wounds X- 1 20 Large Wound Dressing one or multiple wounds []  - 0 Application of Medications - topical []  - 0 Application of Medications - injection INTERVENTIONS - Miscellaneous []  - External ear exam 0 []  - 0 Specimen Collection (cultures, biopsies, blood, body fluids, etc.) []  - 0 Specimen(s) / Culture(s) sent or taken to Lab for analysis []  - 0 Patient Transfer (multiple staff / Civil Service fast streamer / Similar devices) []  - 0 Simple Staple / Suture removal (25 or less) []  - 0 Complex Staple / Suture removal (26 or more) []  - 0 Hypo / Hyperglycemic Management (close monitor of Blood Glucose) []  - 0 Ankle / Brachial Index (ABI) - do not check if billed separately X- 1 5 Vital Signs Has the patient been seen at the hospital within the last three years: Yes Total Score: 120 Level Of Care: New/Established - Level 4 Electronic Signature(s) Signed: 02/12/2020 5:10:21 PM By: [name], [name] Entered By: [name], BSN, RN, [name], Kim on 02/11/2020 16:04:15 [name] ([id number]) -------------------------------------------------------------------------------- Encounter Discharge Information Details [name]. Date of Service: 02/11/2020 3:15 PM Medical Record Number: [id number] Patient Account Number: [id number] Date of  Birth/Sex: 1926-07-18 (86 y.o. F) Treating RN: [name] Primary Care Chantee Cerino: [name] Other Clinician: Referring Aolanis Crispen: [name] Treating Braniyah Besse/Extender: [name] in Treatment: 2 Encounter Discharge Information Items Discharge Condition: Stable Ambulatory Status: Walker Discharge Destination: Emergency Room Telephoned: No Orders Sent: Yes Transportation: Private Auto Accompanied By: caregiver Schedule Follow-up Appointment: Yes Clinical Summary of Care: Electronic Signature(s) Signed: 02/12/2020 5:10:21 PM By: [name], [name] Entered By: [name], BSN, RN, [name], Kim on 02/11/2020 16:05:42 [name] ([id number]) -------------------------------------------------------------------------------- Lower Extremity Assessment Details [name], [name]. Date of Service: 02/11/2020 3:15 PM Medical Record Number: [id number] Patient Account Number: [id number] Date of Birth/Sex: 30-Jun-1926 (86 y.o. F) Treating RN: Montey Hora Primary Care Leonides Minder: [name] Other Clinician: Referring Ridhaan Dreibelbis: [name] Treating Setsuko Robins/Extender: Ricard Dillon Weeks in Treatment: 2 Edema Assessment Assessed: [Left: No] [Right: No] Edema: [Left: Ye] [Right: s] Calf Left: Right: Point of Measurement: 30 cm From Medial Instep cm 28 cm Ankle Left: Right: Point of Measurement: 10 cm From Medial Instep cm 21 cm Vascular Assessment Pulses: Dorsalis Pedis Palpable: [Right:Yes] Electronic Signature(s) Signed: 02/11/2020 4:33:58 PM By: Montey Hora Entered By: Montey Hora on 02/11/2020 15:31:14 [name], [name] ([id number]) -------------------------------------------------------------------------------- Multi Wound Chart Details [name]. Date of Service: 02/11/2020 3:15 PM Medical Record Number: [id number] Patient Account Number: [id number] Date of Birth/Sex: [birth year] (86 y.o.  F) Treating RN: [name] Primary Care Neave Lenger: [name] Other Clinician: Referring Raechel Marcos: [name] Treating Molly Maselli/Extender: Ricard Dillon Weeks in Treatment: 2 Vital Signs Height(in): 62 Pulse(bpm): 60 Weight(lbs): 98 Blood Pressure(mmHg): 148/84 Body Mass Index(BMI): 18 Temperature(F): 97.9 Respiratory Rate(breaths/min): 16 Photos: [N/A:N/A] Wound Location: Right, Lateral Lower Leg N/A N/A Wounding Event: Trauma N/A N/A Primary Etiology: Venous Leg Ulcer N/A N/A Secondary Etiology: Trauma, Other N/A N/A Comorbid  History: Cataracts, Deep Vein Thrombosis, N/A N/A Hypertension, Osteoarthritis, Dementia Date Acquired: 01/20/2020 N/A N/A Weeks of Treatment: 2 N/A N/A Wound Status: Open N/A N/A Measurements L x W x D (cm) 6.5x4.8x0.2 N/A N/A Area (cm) : 24.504 N/A N/A Volume (cm) : 4.901 N/A N/A % Reduction in Area: -73.30% N/A N/A % Reduction in Volume: -73.40% N/A N/A Classification: Full Thickness Without Exposed N/A N/A Support Structures Exudate Amount: Medium N/A N/A Exudate Type: Sanguinous N/A N/A Exudate Color: red N/A N/A Wound Margin: Flat and Intact N/A N/A Granulation Amount: Small (1-33%) N/A N/A Granulation Quality: Pink N/A N/A Necrotic Amount: Large (67-100%) N/A N/A Exposed Structures: Fat Layer (Subcutaneous Tissue) N/A N/A Exposed: Yes Fascia: No Tendon: No Muscle: No Joint: No Bone: No Epithelialization: None N/A N/A Treatment Notes Wound #3 (Right, Lateral Lower Leg) Notes [name], [name] ([id number]) ABD and conform Electronic Signature(s) Signed: 02/12/2020 7:42:01 AM By: Linton Ham MD Entered By: Linton Ham on 02/11/2020 16:53:19 [name], [name] ([id number]) -------------------------------------------------------------------------------- Multi-Disciplinary Care Plan Details [name]. Date of Service: 02/11/2020 3:15 PM Medical Record Number: [id number] Patient Account  Number: [id number] Date of Birth/Sex: Nov 01, 1925 (86 y.o. F) Treating RN: [name] Primary Care Lexander Tremblay: [name] Other Clinician: Referring Mico Spark: [name] Treating Keyarah Mcroy/Extender: [name] in Treatment: 2 Active Inactive Necrotic Tissue Nursing Diagnoses: Impaired tissue integrity related to necrotic/devitalized tissue Goals: Necrotic/devitalized tissue will be minimized in the wound bed Date Initiated: 01/28/2020 Target Resolution Date: 02/04/2020 Goal Status: Active Interventions: Assess patient pain level pre-, during and post procedure and prior to discharge Treatment Activities: Apply topical anesthetic as ordered : 01/28/2020 Notes: Orientation to the Wound Care Program Nursing Diagnoses: Knowledge deficit related to the wound healing center program Goals: Patient/caregiver will verbalize understanding of the [place] Date Initiated: 01/28/2020 Target Resolution Date: 02/04/2020 Goal Status: Active Interventions: Provide education on orientation to the wound center Notes: Soft Tissue Infection Nursing Diagnoses: Impaired tissue integrity Goals: Patient will remain free of wound infection Date Initiated: 01/28/2020 Target Resolution Date: 02/11/2020 Goal Status: Active Interventions: Assess signs and symptoms of infection every visit Notes: Wound/Skin Impairment [name], [name] ([id number]) Nursing Diagnoses: Impaired tissue integrity Goals: Ulcer/skin breakdown will have a volume reduction of 30% by week 4 Date Initiated: 01/28/2020 Target Resolution Date: 02/27/2020 Goal Status: Active Interventions: Assess ulceration(s) every visit Treatment Activities: Skin care regimen initiated : 01/28/2020 Topical wound management initiated : 01/28/2020 Notes: Electronic Signature(s) Signed: 02/12/2020 5:10:21 PM By: [name], [name] Entered By: [name], BSN, RN, [name], Kim on 02/11/2020 15:56:18 [name],  [name] ([id number]) -------------------------------------------------------------------------------- Pain Assessment Details [name]. Date of Service: 02/11/2020 3:15 PM Medical Record Number: [id number] Patient Account Number: [id number] Date of Birth/Sex: 1926/05/17 (86 y.o. F) Treating RN: Montey Hora Primary Care Tamarick Kovalcik: [name] Other Clinician: Referring Shian Goodnow: [name] Treating Abayomi Pattison/Extender: Ricard Dillon Weeks in Treatment: 2 Active Problems Location of Pain Severity and Description of Pain Patient Has Paino Yes Site Locations Pain Location: Pain in Ulcers With Dressing Change: Yes Duration of the Pain. Constant / Intermittento Constant Pain Management and Medication Current Pain Management: Electronic Signature(s) Signed: 02/11/2020 4:33:58 PM By: Montey Hora Entered By: Montey Hora on 02/11/2020 15:23:53 [name], [name] ([id number]) -------------------------------------------------------------------------------- Patient/Caregiver Education Details [name] Date of Service: 02/11/2020 3:15 PM Medical Record Number: [id number] Patient Account Number: [id number] Date of Birth/Gender: [birth year] (87 y.o. F) Treating RN: [name] Primary  Care Physician: [name] Other Clinician: Referring Physician: BABAOFF, [name] Treating Physician/Extender: [name] in Treatment: 2 Education Assessment Education Provided To: Patient Education Topics Provided Infection: Handouts: Infection Prevention and Management, Other: G to ED for infection evaluation Electronic Signature(s) Signed: 02/12/2020 5:10:21 PM By: [name], [name] Entered By: [name], BSN, RN, [name], Kim on 02/11/2020 16:04:51 [name] ([id number]) -------------------------------------------------------------------------------- Wound Assessment Details [name], [name]. Date of Service: 02/11/2020 3:15 PM Medical Record Number: [id number] Patient Account Number: [id number] Date of Birth/Sex: 07/13/1926 (86 y.o. F) Treating RN: Montey Hora Primary Care Meyli Boice: [name] Other Clinician: Referring Rasool Rommel: [name] Treating Nyna Chilton/Extender: Ricard Dillon Weeks in Treatment: 2 Wound Status Wound Number: 3 Primary Etiology: Venous Leg Ulcer Wound Location: Right, Lateral Lower Leg Secondary Trauma, Other Etiology: Wounding Event: Trauma Wound Status: Open Date Acquired: 01/20/2020 Comorbid Cataracts, Deep Vein Thrombosis, Hypertension, Weeks Of Treatment: 2 History: Osteoarthritis, Dementia Clustered Wound: No Photos Wound Measurements Length: (cm) 6.5 Width: (cm) 4.8 Depth: (cm) 0.2 Area: (cm) 24.504 Volume: (cm) 4.901 % Reduction in Area: -73.3% % Reduction in Volume: -73.4% Epithelialization: None Tunneling: No Undermining: No Wound Description Classification: Full Thickness Without Exposed Support Structures Wound Margin: Flat and Intact Exudate Amount: Medium Exudate Type: Sanguinous Exudate Color: red Foul Odor After Cleansing: No Slough/Fibrino Yes Wound Bed Granulation Amount: Small (1-33%) Exposed Structure Granulation Quality: Pink Fascia Exposed: No Necrotic Amount: Large (67-100%) Fat Layer (Subcutaneous Tissue) Exposed: Yes Necrotic Quality: Adherent Slough Tendon Exposed: No Muscle Exposed: No Joint Exposed: No Bone Exposed: No Treatment Notes Wound #3 (Right, Lateral Lower Leg) Notes ABD and conform Electronic Signature(s) Signed: 02/11/2020 4:33:58 PM By: Veto Kemps, [name] ([id number]) Entered By: Montey Hora on 02/11/2020 15:32:52 [name], [name] ([id number]) -------------------------------------------------------------------------------- Vitals Details [name]. Date of Service: 02/11/2020 3:15 PM Medical Record Number: [id number] Patient  Account Number: [id number] Date of Birth/Sex: 26-Sep-1926 (86 y.o. F) Treating RN: [name] Primary Care Aliyana Dlugosz: [name] Other Clinician: Referring Janayla Marik: [name] Treating Dennisha Mouser/Extender: Ricard Dillon Weeks in Treatment: 2 Vital Signs Time Taken: 15:15 Temperature (F): 97.9 Height (in): 62 Pulse (bpm): 66 Weight (lbs): 98 Respiratory Rate (breaths/min): 16 Body Mass Index (BMI): 17.9 Blood Pressure (mmHg): 148/84 Reference Range: 80 - 120 mg / dl Electronic Signature(s) Signed: 02/11/2020 4:35:46 PM By: [name] RCP, RRT, CHT Entered By: Becky Sax, Amado Nash on 02/11/2020 15:21:27

## 2020-02-12 NOTE — Evaluation (Signed)
Physical Therapy Evaluation Patient Details Name: [name] MRN: [id number] DOB: Aug 23, 1926 Today's Date: 02/12/2020   History of Present Illness  Pt admitted for R LE cellulitis secondary to wound from mechanical injury. History of Afib, dementia, and HTN.  Clinical Impression  Pt is a pleasant 86 year old female who was admitted for R LE cellulitis secondary to wound. Pt performs bed mobility with independence, transfers with mod I, and ambulation with cga and RW. Initial standing performed without AD, however due to pain, unable to ambulate. Once given RW, pt able to improve endurance tolerance and demonstrate safe technique with gait training using RW. Pt familiar with RW and reports having one at home. Reports no falls history and is typically very active. Pt demonstrates deficits with cognition and pain. Would benefit from skilled PT to address above deficits and promote optimal return to PLOF. Will keep on acute caseload at this time to prevent further deconditioning.   Follow Up Recommendations No PT follow up    Equipment Recommendations  None recommended by PT    Recommendations for Other Services       Precautions / Restrictions Precautions Precautions: Fall Restrictions Weight Bearing Restrictions: No      Mobility  Bed Mobility Overal bed mobility: Independent             General bed mobility comments: safe technique with upright posture once seated  Transfers Overall transfer level: Modified independent Equipment used: Rolling walker (2 wheeled)             General transfer comment: safe technique with upright posture. Decreased WBing noted on R LE with OOB mobility  Ambulation/Gait Ambulation/Gait assistance: Min guard Gait Distance (Feet): 50 Feet Assistive device: Rolling walker (2 wheeled) Gait Pattern/deviations: Step-to pattern     General Gait Details: ambulated in room with cues for navigating obstacles. No LOB noted or fatigue, just  complaints of pain thus limiting further distances.  Stairs            Wheelchair Mobility    Modified Rankin (Stroke Patients Only)       Balance Overall balance assessment: Mild deficits observed, not formally tested                                           Pertinent Vitals/Pain Pain Assessment: 0-10 Pain Score: 6  Pain Location: R LE Pain Descriptors / Indicators: Discomfort;Constant Pain Intervention(s): Limited activity within patient's tolerance;Repositioned    Home Living Family/patient expects to be discharged to:: Private residence Living Arrangements: Spouse/significant other Available Help at Discharge: Family;Available 24 hours/day Type of Home: House Home Access: Stairs to enter Entrance Stairs-Rails: Right Entrance Stairs-Number of Steps: 2 Home Layout: Two level;Bed/bath upstairs Home Equipment: Walker - 2 wheels      Prior Function Level of Independence: Independent         Comments: prior to admission was very active/independent. Working out at [employer] every day. Since wound and pain developed, has been having difficulty with mobility efforts and was using RW     Hand Dominance        Extremity/Trunk Assessment   Upper Extremity Assessment Upper Extremity Assessment: Overall WFL for tasks assessed    Lower Extremity Assessment Lower Extremity Assessment: Overall WFL for tasks assessed(althought pain limited)       Communication   Communication: No difficulties  Cognition Arousal/Alertness:  Awake/alert Behavior During Therapy: WFL for tasks assessed/performed Overall Cognitive Status: Impaired/Different from baseline                                 General Comments: [place] unsure of the current year. Is oriented to previous history including where she went to college      General Comments      Exercises Other Exercises Other Exercises: Pt  ambulated to bathroom, able to use low toilet safely and perform hygiene at sink without LOB. Does need cues for initiation and is somewhat impulsive with tasks   Assessment/Plan    PT Assessment Patient needs continued PT services  PT Problem List Decreased activity tolerance;Decreased mobility;Decreased cognition;Pain       PT Treatment Interventions Gait training;DME instruction;Stair training;Therapeutic exercise    PT Goals (Current goals can be found in the Care Plan section)  Acute Rehab PT Goals Patient Stated Goal: to go home PT Goal Formulation: With patient Time For Goal Achievement: 02/26/20 Potential to Achieve Goals: Good    Frequency Min 2X/week   Barriers to discharge        Co-evaluation               AM-PAC PT "6 Clicks" Mobility  Outcome Measure Help needed turning from your back to your side while in a flat bed without using bedrails?: None Help needed moving from lying on your back to sitting on the side of a flat bed without using bedrails?: None Help needed moving to and from a bed to a chair (including a wheelchair)?: None Help needed standing up from a chair using your arms (e.g., wheelchair or bedside chair)?: None Help needed to walk in hospital room?: A Little Help needed climbing 3-5 steps with a railing? : A Little 6 Click Score: 22    End of Session Equipment Utilized During Treatment: Gait belt Activity Tolerance: Patient limited by pain Patient left: in chair;with chair alarm set;with family/visitor present Nurse Communication: Mobility status PT Visit Diagnosis: Difficulty in walking, not elsewhere classified (R26.2);Pain Pain - Right/Left: Right Pain - part of body: Leg    Time: 1425-1459 PT Time Calculation (min) (ACUTE ONLY): 34 min   Charges:   PT Evaluation $PT Eval Moderate Complexity: 1 Mod PT Treatments $Therapeutic Activity: 8-22 mins        Greggory Stallion, PT, DPT [phone]   Joas Motton 02/12/2020,  3:38 PM

## 2020-02-12 NOTE — Plan of Care (Signed)

## 2020-02-12 NOTE — TOC Initial Note (Signed)
Transition of Care [facility]) - Initial/Assessment Note    Patient Details  Name: [name] MRN: [id number] Date of Birth: January 29, 1926  [facility]) CM/SW Contact:    Elease Hashimoto, LCSW Phone Number: 02/12/2020, 10:19 AM  Clinical Narrative:  Met with pt and husband and son were in the room with her. She has mild dementia and husband has is HOH, son here to talk with MD. He reports surgeon was consulted and no surgery is planned. Pt woke up after surgery with this leg wound and has been going to the wound Center for treatment. She reports it is very sore and hurts. She lives with her husband and is independent prior to admission.  Husband still drives and takes her to her appointments. They do not feel they will have any needs.              Expected Discharge Plan: Home/Self Care Barriers to Discharge: Continued Medical Work up   Patient Goals and CMS Choice Patient states their goals for this hospitalization and ongoing recovery are:: Plan to go home after this      Expected Discharge Plan and Services Expected Discharge Plan: Home/Self Care In-house Referral: Clinical Social Work     Living arrangements for the past 2 months: Single Family Home                                      Prior Living Arrangements/Services Living arrangements for the past 2 months: Single Family Home Lives with:: Spouse Patient language and need for interpreter reviewed:: No Do you feel safe going back to the place where you live?: Yes      Need for Family Participation in Patient Care: Yes (Comment) Care giver support system in place?: Yes (comment)   Criminal Activity/Legal Involvement Pertinent to Current Situation/Hospitalization: Yes - Comment as needed  Activities of Daily Living Home Assistive Devices/Equipment: Walker (specify type) ADL Screening (condition at time of admission) Patient's cognitive ability adequate to safely complete daily activities?: No Is  the patient deaf or have difficulty hearing?: No Does the patient have difficulty seeing, even when wearing glasses/contacts?: No Does the patient have difficulty concentrating, remembering, or making decisions?: Yes Patient able to express need for assistance with ADLs?: Yes Does the patient have difficulty dressing or bathing?: No Independently performs ADLs?: Yes (appropriate for developmental age) Does the patient have difficulty walking or climbing stairs?: No Weakness of Legs: None Weakness of Arms/Hands: None  Permission Sought/Granted Permission sought to share information with : Family Supports Permission granted to share information with : Yes, Verbal Permission Granted  Share Information with NAME: Timmothy Sours     Permission granted to share info w Relationship: Husband     Emotional Assessment Appearance:: Appears stated age Attitude/Demeanor/Rapport: Gracious Affect (typically observed): Adaptable, Accepting, Calm Orientation: : Oriented to Self, Oriented to Place, Oriented to  Time, Oriented to Situation Alcohol / Substance Use: Never Used Psych Involvement: No (comment)  Admission diagnosis:  Cellulitis of right leg [L03.115] Cellulitis of right lower extremity [L03.115] Dementia without behavioral disturbance, unspecified dementia type ([place]) [F03.90] Patient Active Problem List   Diagnosis Date Noted  . Cellulitis of right leg 02/11/2020  . Ulcer of right leg, with fat layer exposed ([place]) 02/11/2020  . Dementia without behavioral disturbance ([place]) 02/11/2020  . Auditory vertigo 06/21/2015  . Irregular cardiac rhythm 06/21/2015  . Malignant neoplastic disease ([place])  06/21/2015  . Bad memory 06/21/2015  . TI (tricuspid incompetence) 03/11/2015  . Chronic atrial fibrillation ([place]) 09/03/2014  . Edema leg 09/03/2014  . MI (mitral incompetence) 09/03/2014  . Combined fat and carbohydrate induced hyperlipemia 12/26/2013  . Meniere's disease   . Irregular heart beat   .  Memory difficulty   . Hypertension   . Cancer [facility])    PCP:  Derinda Late, MD Pharmacy:   [place] [place] [address] [place] [address] Phone: [phone] Fax: [phone]  [place], [place] [address] [place] [address] Phone: [phone] Fax: [phone]     Social Determinants of Health (SDOH) Interventions    Readmission Risk Interventions No flowsheet data found.

## 2020-02-12 NOTE — Consult Note (Signed)
[facility] SURGICAL CONSULTATION NOTE (initial) - [id number]   HISTORY OF PRESENT ILLNESS (HPI):  86 y.o. female presented to [facility] ED last night for evaluation of right lower extremity wound. Patient reports has a history of right lower extremity skin tear to her mid anterior tibia during recent admission for kyphoplasty. She was applying Medihoney at home and had wrap placed on 01/28/2020. However she removed the wrap prematurely. She followed up on 04/14 and required debridement and silver alginate was applied to the wound. However over the course of the last few days she continues to endorse worsening pain. This is exacerbated with palpation and ambulation. No fever or chills at home. No new injuries. Work up in the ED was essentially unremarkable aside from INR of 3.) (she does take coumadin for valvular disease). She was admitted to medicine service for cellulitis of the RLE.   Surgery is consulted by hospitalist physician Dr. Judd Gaudier, MD in this context for evaluation and management of RLE wound in the setting of cellulitis.   PAST MEDICAL HISTORY (PMH):  Past Medical History:  Diagnosis Date  . Atrial fibrillation ([place])   . 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 Hypertension   . Irregular heart beat   . Memory difficulty   . Meniere's disease      PAST SURGICAL HISTORY ([place]):  Past Surgical History:  Procedure Laterality Date  . BACK SURGERY    . IR GENERIC HISTORICAL  05/03/2016   IR RADIOLOGIST EVAL & MGMT 05/03/2016 Corrie Mckusick, DO [facility] INTERV RAD  . IR GENERIC HISTORICAL  02/29/2016   IR RADIOLOGIST EVAL & MGMT 02/29/2016 Marybelle Killings, MD [facility] INTERV RAD  . KYPHOPLASTY N/A 01/20/2020   Procedure: T9 AND T6 KYPHOPLASTY;  Surgeon: Hessie Knows, MD;  Location: [facility] ORS;  Service: Orthopedics;  Laterality: N/A;  . MASTOIDECTOMY    . menieres surg    . Squamous cell excised  2010   Ear  . TONSILLECTOMY       MEDICATIONS:  Prior to Admission  medications   Medication Sig Start Date End Date Taking? Authorizing Provider  acetaminophen (TYLENOL) 500 MG tablet Take 1,000 mg by mouth in the morning and at bedtime.   Yes [provider]  amLODipine (NORVASC) 5 MG tablet Take 5 mg by mouth every evening.    Yes [provider]  losartan (COZAAR) 50 MG tablet Take 50 mg by mouth every evening. 01/03/20  Yes [provider]  warfarin (COUMADIN) 5 MG tablet Take 5 mg by mouth every evening.    Yes [provider]  memantine (NAMENDA) 5 MG tablet Take 5 mg by mouth 2 (two) times daily. 12/02/19   [provider]     ALLERGIES:  No Known Allergies   SOCIAL HISTORY:  Social History   Socioeconomic History  . Marital status: Married    Spouse name: Not on file  . Number of children: Not on file  . Years of education: Not on file  . Highest education level: Not on file  Occupational History  . Not on file  Tobacco Use  . Smoking status: Former Research scientist (life sciences)  . Smokeless tobacco: Never Used  Substance and Sexual Activity  . Alcohol use: Yes    Alcohol/week: 7.0 standard drinks    Types: 7 Standard drinks or equivalent per week  . Drug use: Never  . Sexual activity: Yes    Birth control/protection: Post-menopausal  Other Topics Concern  .  Not on file  Social History Narrative  . Not on file   Social Determinants of Health   Financial Resource Strain:   . Difficulty of Paying Living Expenses:   Food Insecurity:   . Worried About Charity fundraiser in the Last Year:   . Arboriculturist in the Last Year:   Transportation Needs:   . Film/video editor (Medical):   Marland Kitchen Lack of Transportation (Non-Medical):   Physical Activity:   . Days of Exercise per Week:   . Minutes of Exercise per Session:   Stress:   . Feeling of Stress :   Social Connections:   . Frequency of Communication with Friends and Family:   . Frequency of Social Gatherings with Friends and Family:   . Attends  Religious Services:   . Active Member of Clubs or Organizations:   . Attends Archivist Meetings:   Marland Kitchen Marital Status:   Intimate Partner Violence:   . Fear of Current or Ex-Partner:   . Emotionally Abused:   Marland Kitchen Physically Abused:   . Sexually Abused:      FAMILY HISTORY:  Family History  Problem Relation Age of Onset  . Hypertension Mother   . Hypertension Father   . Heart disease Father       REVIEW OF SYSTEMS:  Review of Systems  Unable to perform ROS: Dementia  Constitutional: Negative for chills and fever.  Skin:       + RLE Wound    VITAL SIGNS:  Temp:  [97.8 F (36.6 C)-98.7 F (37.1 C)] 97.8 F (36.6 C) (04/22 0140) Pulse Rate:  [85-107] 85 (04/22 0140) Resp:  [17-18] 17 (04/22 0140) BP: (140-200)/(87-113) 145/87 (04/22 0140) SpO2:  [89 %-96 %] 90 % (04/22 0140) Weight:  [40.8 kg] 40.8 kg (04/21 1711)     Height: 5' (152.4 cm) Weight: 40.8 kg BMI (Calculated): 17.58   INTAKE/OUTPUT:  04/21 0701 - 04/22 0700 In: 500 [IV Piggyback:500] Out: -   PHYSICAL EXAM:  Physical Exam Vitals and nursing note reviewed.  Constitutional:      General: She is not in acute distress.    Appearance: Normal appearance. She is normal weight. She is not ill-appearing.  HENT:     Head: Normocephalic and atraumatic.  Eyes:     General: No scleral icterus.    Conjunctiva/sclera: Conjunctivae normal.  Cardiovascular:     Pulses:          Dorsalis pedis pulses are 2+ on the right side and 2+ on the left side.       Posterior tibial pulses are 2+ on the right side and 2+ on the left side.  Pulmonary:     Effort: Pulmonary effort is normal. No respiratory distress.  Genitourinary:    Comments: Deferred Musculoskeletal:     Right lower leg: No edema.     Left lower leg: No edema.  Skin:    General: Skin is warm and dry.       Neurological:     General: No focal deficit present.     Mental Status: She is alert. Mental status is at baseline.  Psychiatric:         Mood and Affect: Mood normal.      RLE Wound (02/12/2020):      Labs:  CBC Latest Ref Rng & Units 02/12/2020 02/11/2020 01/20/2020  WBC 4.0 - 10.5 K/uL 5.0 6.2 -  Hemoglobin 12.0 - 15.0 g/dL 13.9 16.4(H) 16.0(H)  Hematocrit  36.0 - 46.0 % 40.6 46.1(H) 47.0(H)  Platelets 150 - 400 K/uL 310 359 -   CMP Latest Ref Rng & Units 02/12/2020 02/11/2020 01/20/2020  Glucose 70 - 99 mg/dL 107(H) 95 137(H)  BUN 8 - 23 mg/dL 9 10 5(L)  Creatinine 0.44 - 1.00 mg/dL 0.49 0.57 0.40(L)  Sodium 135 - 145 mmol/L 135 135 135  Potassium 3.5 - 5.1 mmol/L 3.3(L) 3.4(L) 3.0(L)  Chloride 98 - 111 mmol/L 97(L) 95(L) 99  CO2 22 - 32 mmol/L 28 24 -  Calcium 8.9 - 10.3 mg/dL 8.5(L) 9.5 -  Total Protein 6.5 - 8.1 g/dL - 8.0 -  Total Bilirubin 0.3 - 1.2 mg/dL - 1.0 -  Alkaline Phos 38 - 126 U/L - 133(H) -  AST 15 - 41 U/L - 20 -  ALT 0 - 44 U/L - 12 -     Imaging studies:   XR Right Tib/Fib (02/11/2020) personally reviewed and agree with radiologist report reviewed below:  IMPRESSION: Soft tissue edema without soft tissue air or radiopaque foreign body. No radiographic findings of osteomyelitis.   Assessment/Plan: (ICD-10's: L03.115) 86 y.o. female with cellulitis and superficial wound to the RLE, complicated by pertinent comorbidities including history of dementia.   - Dressing recommendations:   - Gentle irrigation with NS   - Apply silvadene to wound   - Apply non-adherent dressing   - Wrap with Kerlix gauze, DO NOT tape skin   - This should be done BID initially   - No emergent debridement needed, unfortunately this would result in a much larger wound and may not be in the patient's best interest  - Continue IV Abx (Rocephin)  - Pain control prn  - further management per primary team   All of the above findings and recommendations were discussed with the patient and her son at bedsdie, and all of patient's and her son's questions were answered to their expressed satisfaction.  Thank you  for the opportunity to participate in this patient's care.   -- Edison Simon, PA-C [facility] 02/12/2020, 7:36 AM [phone] M-F: 7am - 4pm

## 2020-02-12 NOTE — Consult Note (Addendum)
ANTICOAGULATION CONSULT NOTE - Initial Consult  Pharmacy Consult for Warfarin Indication: atrial fibrillation  No Known Allergies  Patient Measurements: Height: 5' (152.4 cm) Weight: 40.8 kg (90 lb) IBW/kg (Calculated) : 45.5  Vital Signs: Temp: 97.8 F (36.6 C) (04/22 1026) Temp Source: Oral (04/22 1026) BP: 153/97 (04/22 1026) Pulse Rate: 89 (04/22 1026)  Labs: Recent Labs    02/11/20 1716 02/12/20 0443  HGB 16.4* 13.9  HCT 46.1* 40.6  PLT 359 310  LABPROT 31.0* 33.2*  INR 3.0* 3.3*  CREATININE 0.57 0.49    Estimated Creatinine Clearance: 29.5 mL/min (by C-G formula based on SCr of 0.49 mg/dL).   Medical History: Past Medical History:  Diagnosis Date  . Atrial fibrillation ([place])   . 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 Hypertension   . Irregular heart beat   . Memory difficulty   . Meniere's disease     Medications:  PTA warfarin dose 5mg  qd - last taken 4/20 @ 1800  4/21 INR 3.0 - no warfarin 4/22 INR 3.3  Assessment: Pharmacy has been consulted to resume warfarin dosing in 86 yo female with chronic a fib.  Goal of Therapy:  INR 2-3 Monitor platelets by anticoagulation protocol: Yes   Plan:  Pt mildly supratherapeutic @ 3.3 - will resume partial home dose (3mg ) as pt has not had warfarin for 1 day during admission, but starting IV abx.     Will follow INR daily and CBC a minimum of every 3 days per protocol.  Lu Duffel, PharmD, BCPS Clinical Pharmacist 02/12/2020 1:44 PM

## 2020-02-12 NOTE — Consult Note (Signed)
Pharmacy Antibiotic Note  [name] is a 86 y.o. female admitted on 02/11/2020 with cellulitis.  Pharmacy has been consulted for Vancomycin dosing.  Plan: Continue Ceftriaxone 1g q24h as ordered  Will give 1g Vancomycin IV loading dose, followed by   Vancomycin 750 mg IV Q 48 hrs.  Goal AUC 400-550. Expected AUC: 441 SCr used: 0.8  Will dose empirically given renal function, but for cellulitis, will consider a change in therapy to Zyvox if renal function changes or dosing continues into next week.  Height: 5' (152.4 cm) Weight: 40.8 kg (90 lb) IBW/kg (Calculated) : 45.5  Temp (24hrs), Avg:98.1 F (36.7 C), Min:97.8 F (36.6 C), Max:98.7 F (37.1 C)  Recent Labs  Lab 02/11/20 1716 02/12/20 0443  WBC 6.2 5.0  CREATININE 0.57 0.49  LATICACIDVEN 1.3  --     Estimated Creatinine Clearance: 29.5 mL/min (by C-G formula based on SCr of 0.49 mg/dL).    No Known Allergies  Antimicrobials this admission: Ceftriaxone 4/21 >>  Vancomycin 4/22 >>   Dose adjustments this admission: none  Microbiology results: 4/21 BCx: pending  Thank you for allowing pharmacy to be a part of this patient's care.  Lu Duffel, PharmD, BCPS Clinical Pharmacist 02/12/2020 1:58 PM

## 2020-02-12 NOTE — Progress Notes (Signed)
RUNE, TYSON ([id number]) Visit Report for 02/11/2020 HPI Details [name], [name]. Date of Service: 02/11/2020 3:15 PM Medical Record Number: [id number] Patient Account Number: [id number] Date of Birth/Sex: 1926-03-08 (86 y.o. F) Treating RN: Cornell Barman Primary Care Provider: Derinda Late Other Clinician: Referring Provider: BABAOFF, MARCUS Treating Provider/Extender: Tito Dine in Treatment: 2 History of Present Illness HPI Description: 06/06/17 on evaluation today patient presents for evaluation concerning an ulcer which she initially had arise as a result of striking her left lower extremity money bed frame. With that being said this was roughly one month ago and unfortunately the wound despite two courses of Keflex have not really made a dramatic improvement. This has continued to drain as well as being exquisitely tender at times. Even to the point that she is having a difficult time walking. Patient does have chronic atrial fibrillation which subsequently leads to her being on long-term anticoagulant therapy and this causes ED bruising which may have contributed to this entry and where things are at this point. She does have valvular heart disease as well as hypertension and her last INR was 2.7. Currently Neosporin, Gauls, and an ace wrap has been utilized. Keflex is the antibiotic that she has been on. Patient has not had a culture up to this point and she states that her blood pressure at the primary care office yesterday was 138/72. Her pain is significant related to be an eight out of 10. No fevers, chills, nausea, or vomiting noted at this time. 06/20/17; patient was admitted here 2 weeks ago. She had a traumatic wound on her left lateral lower leg probably in the setting of some degree of venous insufficiency with surrounding cellulitis. She had been on Keflex, after we saw her she was put on doxycycline which she apparently did not tolerate. Culture  that was done at the time showed Morganella which was resistant to Keflex. Apparently the patient continued to take Keflex after the appointment. She is also on Coumadin. I had tried to change her antibiotic when the Morganella culture was brought to my attention however apparently our staff could not reach the patient to inform them of the antibiotic change 06/26/17; patient has wound on her left lateral lower extremity in the setting of some degree of venous insufficiency. I gave her cefdinir last week and she is completing this. There is no evidence of surrounding infection. Aggressive debridement last week, wound bed looks healthier. We've been using Aquacel. They're traveling in the [place] next week we'll see her back in 2 weeks unless there are problems. 07/10/17; the patient is been using Aquacel Ag her husband is changing this with Kerlix and conform. She still complains of a lot of pain. Apparently with the antibiotics. We gave 2 or 3 weeks ago. Her INR went up to 6, this was managed by her primary physician 07/17/17; she is using Aquacel Ag and a border foam. Still discomfort although dimensions are better. 07/24/17; patient or for review of a trauma wound on her left anterior leg in the setting of some degree of chronic venous insufficiency she has been using Aquacel Ag and a border foam and making progress. 07/31/17; only a small open area of this original significant trauma is still open. Her husband is changing this every second day [Aquacel Ag] Readmission: 01/14/19 on evaluation today patient is that she seen for initial inspection during the office visit today concerning a trauma/skin tear to the left anterior lower extremity. This is roughly  the same region where she was previously treated and years past when she was here in the clinic. Nonetheless upon evaluation today the patient's wound actually showed signs of decent granulation around the edge although unfortunately the skin flap  that was torn back did fold under on itself and the flap itself is dying. She does have some eschar surrounded the edges of the wound at this point. Nonetheless this also was very tender for her. The patient does have a history of hypertension, atrial fibrillation, and long-term use anticoagulant therapy. 01/21/19 on evaluation today patient's wound on the anterior portion of her lower extremity appears to still be necrotic as far as the surface of the wound is concerned. Unfortunately there is no significant improvement at this point compared to last week they were not able to get the Santyl that are using Medihoney and maybe one reason why. The other is I think this may be stained to drive. Possibly adding something such as mepitel over top of the Medihoney may help to loosen this up quite a bit which I think would be in her best interest. Also she still has a lot of erythema and is very tender to touch I'm afraid that we may need to switch to a different antibiotic to see if this will be of benefit there still really nothing that I can culture to get a better picture of what's going on and what we need to do from the standpoint of antibiotics. I do believe the patient needs a referral Talmuds me to vascular in order to evaluate her blood flow based on what I'm seeing they will be able to perform TBI testing if nothing else along with the vascular evaluation to see if there's any evidence for limited blood flow to this lower extremity. 4/8; I have reviewed the patient's arterial studies and they are really quite good. There should be no arterial impediments to healing the wound on the right anterior tibial area. 4/15; patient has a small open area over the left mid tibial area. Still requiring debridement we are using Santyl here and Medihoney at home [Santyl unaffordable] 4/22; difficult, presumably traumatic area over the left mid tibial area in the setting of chronic venous insufficiency. We have  been using Santyl here and meta honey at home. The wound surface is getting gradually better and slight improvement in dimensions. Her husband is changing this daily 4/29; left mid tibia presumably traumatic wound in the setting of chronic venous insufficiency. We have been using Santyl and a combination of Medihoney at home. Arrives today with a much better looking healthy granulated surface. We will change the primary dressing to [facility] 5/6; left mid tibia smaller reasonably healthy looking wound using Hydrofera Blue since last week 5/20-Patient returns to clinic with a left mid tibial wound looking better, we have been using Hydrofera Blue since the previous week 5/27; patient returns to clinic with a left mid tibial wound covered in raised eschar. We have been using Hydrofera Blue her husband changes the dressings using a border foam cover READMISSION 01/28/20 SAKOYA, HINKEL ([id number]) Mrs. Rahaman is a patient who is now 86 years old. As usual she comes in with her husband who is her primary caregiver. They were here last from March through May 2020 with a traumatic wound on the left anterior tibia we eventually heal this out. She has chronic venous insufficiency. She is also here previously in 2018. Her current problem started 8 days ago. She went in  the hospital for a thoracic level kyphoplasty. The procedure she developed a skin tear on her leg. This was mentioned to her. Her husband is applying Medihoney to the area and they are in for our review of this. She finds this much too uncomfortable to attempt ABIs although her ABIs when she was in the clinic last year were quite normal bilaterally Past medical history she is not a diabetic. As noted she had a T9 and T6 kyphoplasty, Alzheimer's disease, chronic atrial fibrillation on Coumadin and hypertension 4/14; patient arrives in clinic having removed part of the wrap. We attempted to leave this on all week. Unfortunately none  that none of the tissue that looks semiviable last week has remained viable. She required an extensive debridement. We have been using silver alginate 4/21; the patient arrived today with tremendous erythema around the large part of the wound on the right lateral leg. This extends medially and down towards her foot. I marked the area but I think the extent of the likely cellulitis is beyond what can be safely managed as an outpatient. Electronic Signature(s) Signed: 02/12/2020 7:42:01 AM By: Linton Ham MD Entered By: Linton Ham on 02/11/2020 16:54:39 Lavalley, [name] ([id number]) -------------------------------------------------------------------------------- Physical Exam Details Patient Name: JENNINGS, BUSHNER. Date of Service: 02/11/2020 3:15 PM Medical Record Number: [id number] Patient Account Number: [id number] Date of Birth/Sex: 1925/12/08 (86 y.o. F) Treating RN: Cornell Barman Primary Care Provider: Derinda Late Other Clinician: Referring Provider: BABAOFF, MARCUS Treating Provider/Extender: Ricard Dillon Weeks in Treatment: 2 Constitutional Patient is hypertensive.. Pulse regular and within target range for patient.Marland Kitchen Respirations regular, non-labored and within target range.. Temperature is normal and within the target range for the patient.Marland Kitchen appears in no distress. Respiratory Respiratory effort is easy and symmetric bilaterally. Rate is normal at rest and on room air.. Cardiovascular Dorsalis pedis pulses were palpable. Notes Wound exam; sizable wound in the right anterior lower extremity. Not much of a viable surface here in spite of last week's debridement. The surrounding tissue is red and inflamed and angry and painful. This is compatible with a cellulitis. I have marked this area Electronic Signature(s) Signed: 02/12/2020 7:42:01 AM By: Linton Ham MD Entered By: Linton Ham on 02/11/2020 16:57:28 [name]  ([id number]) -------------------------------------------------------------------------------- Physician Orders Details Patient Name: [name] Date of Service: 02/11/2020 3:15 PM Medical Record Number: [id number] Patient Account Number: [id number] Date of Birth/Sex: Oct 06, 1926 (86 y.o. F) Treating RN: Cornell Barman Primary Care Provider: BABAOFF, MARCUS Other Clinician: Referring Provider: BABAOFF, MARCUS Treating Provider/Extender: Tito Dine in Treatment: 2 Verbal / Phone Orders: No Diagnosis Coding Wound Cleansing Wound #3 Right,Lateral Lower Leg o Cleanse wound with mild soap and water Anesthetic (add to Medication List) Wound #3 Right,Lateral Lower Leg o Topical Lidocaine 4% cream applied to wound bed prior to debridement (In Clinic Only). Primary Wound Dressing o Other: - ABD Secondary Dressing Wound #3 Right,Lateral Lower Leg o Conform/Kerlix Dressing Change Frequency Wound #3 Right,Lateral Lower Leg o Change dressing every other day. Follow-up Appointments Wound #3 Right,Lateral Lower Leg o Return Appointment in 1 week. Additional Orders / Instructions Wound #3 Right,Lateral Lower Leg o Other: - Go to the Emergency Department for treatment of infection Electronic Signature(s) Signed: 02/12/2020 7:42:01 AM By: Linton Ham MD Signed: 02/12/2020 5:10:21 PM By: Gretta Cool, BSN, RN, [name], Kim RN, BSN Entered By: Gretta Cool, BSN, RN, [name], Kim on 02/11/2020 16:03:18 KATALINA, PETTIBONE ([id number]) -------------------------------------------------------------------------------- Problem List Details Patient Name: ANALYNN, BETHARD. Date of Service: 02/11/2020  3:15 PM Medical Record Number: [id number] Patient Account Number: [id number] Date of Birth/Sex: 10-09-1926 (86 y.o. F) Treating RN: Cornell Barman Primary Care Provider: Derinda Late Other Clinician: Referring Provider: BABAOFF, MARCUS Treating Provider/Extender: Tito Dine in  Treatment: 2 Active Problems ICD-10 Evaluated Encounter Code Description Active Date Today Diagnosis S81.811D Laceration without foreign body, right lower leg, subsequent encounter 01/28/2020 No Yes L97.812 Non-pressure chronic ulcer of other part of right lower leg with fat layer 01/28/2020 No Yes exposed I87.321 Chronic venous hypertension (idiopathic) with inflammation of right lower 01/28/2020 No Yes extremity Inactive Problems Resolved Problems Electronic Signature(s) Signed: 02/12/2020 7:42:01 AM By: Linton Ham MD Entered By: Linton Ham on 02/11/2020 16:53:12 Odonovan, [name] ([id number]) -------------------------------------------------------------------------------- Progress Note Details Patient Name: [name]. Date of Service: 02/11/2020 3:15 PM Medical Record Number: [id number] Patient Account Number: [id number] Date of Birth/Sex: 21-Dec-1925 (86 y.o. F) Treating RN: Cornell Barman Primary Care Provider: Derinda Late Other Clinician: Referring Provider: BABAOFF, MARCUS Treating Provider/Extender: Ricard Dillon Weeks in Treatment: 2 Subjective History of Present Illness (HPI) 06/06/17 on evaluation today patient presents for evaluation concerning an ulcer which she initially had arise as a result of striking her left lower extremity money bed frame. With that being said this was roughly one month ago and unfortunately the wound despite two courses of Keflex have not really made a dramatic improvement. This has continued to drain as well as being exquisitely tender at times. Even to the point that she is having a difficult time walking. Patient does have chronic atrial fibrillation which subsequently leads to her being on long-term anticoagulant therapy and this causes ED bruising which may have contributed to this entry and where things are at this point. She does have valvular heart disease as well as hypertension and her last INR was 2.7. Currently Neosporin,  Gauls, and an ace wrap has been utilized. Keflex is the antibiotic that she has been on. Patient has not had a culture up to this point and she states that her blood pressure at the primary care office yesterday was 138/72. Her pain is significant related to be an eight out of 10. No fevers, chills, nausea, or vomiting noted at this time. 06/20/17; patient was admitted here 2 weeks ago. She had a traumatic wound on her left lateral lower leg probably in the setting of some degree of venous insufficiency with surrounding cellulitis. She had been on Keflex, after we saw her she was put on doxycycline which she apparently did not tolerate. Culture that was done at the time showed Morganella which was resistant to Keflex. Apparently the patient continued to take Keflex after the appointment. She is also on Coumadin. I had tried to change her antibiotic when the Morganella culture was brought to my attention however apparently our staff could not reach the patient to inform them of the antibiotic change 06/26/17; patient has wound on her left lateral lower extremity in the setting of some degree of venous insufficiency. I gave her cefdinir last week and she is completing this. There is no evidence of surrounding infection. Aggressive debridement last week, wound bed looks healthier. We've been using Aquacel. They're traveling in the [place] next week we'll see her back in 2 weeks unless there are problems. 07/10/17; the patient is been using Aquacel Ag her husband is changing this with Kerlix and conform. She still complains of a lot of pain. Apparently with the antibiotics. We gave 2 or 3 weeks ago. Her INR went up  to 6, this was managed by her primary physician 07/17/17; she is using Aquacel Ag and a border foam. Still discomfort although dimensions are better. 07/24/17; patient or for review of a trauma wound on her left anterior leg in the setting of some degree of chronic venous insufficiency she has  been using Aquacel Ag and a border foam and making progress. 07/31/17; only a small open area of this original significant trauma is still open. Her husband is changing this every second day [Aquacel Ag] Readmission: 01/14/19 on evaluation today patient is that she seen for initial inspection during the office visit today concerning a trauma/skin tear to the left anterior lower extremity. This is roughly the same region where she was previously treated and years past when she was here in the clinic. Nonetheless upon evaluation today the patient's wound actually showed signs of decent granulation around the edge although unfortunately the skin flap that was torn back did fold under on itself and the flap itself is dying. She does have some eschar surrounded the edges of the wound at this point. Nonetheless this also was very tender for her. The patient does have a history of hypertension, atrial fibrillation, and long-term use anticoagulant therapy. 01/21/19 on evaluation today patient's wound on the anterior portion of her lower extremity appears to still be necrotic as far as the surface of the wound is concerned. Unfortunately there is no significant improvement at this point compared to last week they were not able to get the Santyl that are using Medihoney and maybe one reason why. The other is I think this may be stained to drive. Possibly adding something such as mepitel over top of the Medihoney may help to loosen this up quite a bit which I think would be in her best interest. Also she still has a lot of erythema and is very tender to touch I'm afraid that we may need to switch to a different antibiotic to see if this will be of benefit there still really nothing that I can culture to get a better picture of what's going on and what we need to do from the standpoint of antibiotics. I do believe the patient needs a referral Talmuds me to vascular in order to evaluate her blood flow based on what  I'm seeing they will be able to perform TBI testing if nothing else along with the vascular evaluation to see if there's any evidence for limited blood flow to this lower extremity. 4/8; I have reviewed the patient's arterial studies and they are really quite good. There should be no arterial impediments to healing the wound on the right anterior tibial area. 4/15; patient has a small open area over the left mid tibial area. Still requiring debridement we are using Santyl here and Medihoney at home [Santyl unaffordable] 4/22; difficult, presumably traumatic area over the left mid tibial area in the setting of chronic venous insufficiency. We have been using Santyl here and meta honey at home. The wound surface is getting gradually better and slight improvement in dimensions. Her husband is changing this daily 4/29; left mid tibia presumably traumatic wound in the setting of chronic venous insufficiency. We have been using Santyl and a combination of Medihoney at home. Arrives today with a much better looking healthy granulated surface. We will change the primary dressing to [facility] 5/6; left mid tibia smaller reasonably healthy looking wound using Hydrofera Blue since last week 5/20-Patient returns to clinic with a left mid tibial wound looking  better, we have been using Hydrofera Blue since the previous week 5/27; patient returns to clinic with a left mid tibial wound covered in raised eschar. We have been using Hydrofera Blue her husband changes the dressings using a border foam cover READMISSION 01/28/20 Mrs. Shafer is a patient who is now 86 years old. As usual she comes in with her husband who is her primary caregiver. They were here last from March through May 2020 with a traumatic wound on the left anterior tibia we eventually heal this out. She has chronic venous insufficiency. She is also here previously in 2018. AADYA, VANESS ([id number]) Her current problem started 8 days  ago. She went in the hospital for a thoracic level kyphoplasty. The procedure she developed a skin tear on her leg. This was mentioned to her. Her husband is applying Medihoney to the area and they are in for our review of this. She finds this much too uncomfortable to attempt ABIs although her ABIs when she was in the clinic last year were quite normal bilaterally Past medical history she is not a diabetic. As noted she had a T9 and T6 kyphoplasty, Alzheimer's disease, chronic atrial fibrillation on Coumadin and hypertension 4/14; patient arrives in clinic having removed part of the wrap. We attempted to leave this on all week. Unfortunately none that none of the tissue that looks semiviable last week has remained viable. She required an extensive debridement. We have been using silver alginate 4/21; the patient arrived today with tremendous erythema around the large part of the wound on the right lateral leg. This extends medially and down towards her foot. I marked the area but I think the extent of the likely cellulitis is beyond what can be safely managed as an outpatient. Objective Constitutional Patient is hypertensive.. Pulse regular and within target range for patient.Marland Kitchen Respirations regular, non-labored and within target range.. Temperature is normal and within the target range for the patient.Marland Kitchen appears in no distress. Vitals Time Taken: 3:15 PM, Height: 62 in, Weight: 98 lbs, BMI: 17.9, Temperature: 97.9 F, Pulse: 66 bpm, Respiratory Rate: 16 breaths/min, Blood Pressure: 148/84 mmHg. Respiratory Respiratory effort is easy and symmetric bilaterally. Rate is normal at rest and on room air.. Cardiovascular Dorsalis pedis pulses were palpable. General Notes: Wound exam; sizable wound in the right anterior lower extremity. Not much of a viable surface here in spite of last week's debridement. The surrounding tissue is red and inflamed and angry and painful. This is compatible with a  cellulitis. I have marked this area Integumentary (Hair, Skin) Wound #3 status is Open. Original cause of wound was Trauma. The wound is located on the Right,Lateral Lower Leg. The wound measures 6.5cm length x 4.8cm width x 0.2cm depth; 24.504cm^2 area and 4.901cm^3 volume. There is Fat Layer (Subcutaneous Tissue) Exposed exposed. There is no tunneling or undermining noted. There is a medium amount of sanguinous drainage noted. The wound margin is flat and intact. There is small (1-33%) pink granulation within the wound bed. There is a large (67-100%) amount of necrotic tissue within the wound bed including Adherent Slough. Assessment Active Problems ICD-10 Laceration without foreign body, right lower leg, subsequent encounter Non-pressure chronic ulcer of other part of right lower leg with fat layer exposed Chronic venous hypertension (idiopathic) with inflammation of right lower extremity Plan Wound Cleansing: Wound #3 Right,Lateral Lower Leg: Cleanse wound with mild soap and water Roes, Davonna C. ([id number]) Anesthetic (add to Medication List): Wound #3 Right,Lateral Lower Leg: Topical Lidocaine  4% cream applied to wound bed prior to debridement (In Clinic Only). Primary Wound Dressing: Other: - ABD Secondary Dressing: Wound #3 Right,Lateral Lower Leg: Conform/Kerlix Dressing Change Frequency: Wound #3 Right,Lateral Lower Leg: Change dressing every other day. Follow-up Appointments: Wound #3 Right,Lateral Lower Leg: Return Appointment in 1 week. Additional Orders / Instructions: Wound #3 Right,Lateral Lower Leg: Other: - Go to the Emergency Department for treatment of infection 1. I felt that the extent of the cellulitis here was beyond what I was comfortable trying to manage with oral antibiotics. I recommended they go to the emergency room for hopefully 2 days of admission for IV antibiotics before sending her out on oral antibiotics. 2. We still do not have a viable  surface on this Electronic Signature(s) Signed: 02/12/2020 7:42:01 AM By: Linton Ham MD Entered By: Linton Ham on 02/11/2020 16:58:37 Pranger, [name] ([id number]) -------------------------------------------------------------------------------- SuperBill Details Patient Name: [name]. Date of Service: 02/11/2020 Medical Record Number: [id number] Patient Account Number: [id number] Date of Birth/Sex: [birth year] (86 y.o. F) Treating RN: Cornell Barman Primary Care Provider: Derinda Late Other Clinician: Referring Provider: BABAOFF, MARCUS Treating Provider/Extender: Ricard Dillon Weeks in Treatment: 2 Diagnosis Coding ICD-10 Codes Code Description S81.811D Laceration without foreign body, right lower leg, subsequent encounter L97.812 Non-pressure chronic ulcer of other part of right lower leg with fat layer exposed I87.321 Chronic venous hypertension (idiopathic) with inflammation of right lower extremity Facility Procedures CPT4 Code: [id number] Description: 99214 - WOUND CARE VISIT-LEV 4 EST PT Modifier: Quantity: 1 Physician Procedures CPT4 Code: [id number] Description: [id number] - WC PHYS LEVEL 3 - EST PT Modifier: Quantity: 1 CPT4 Code: Description: ICD-10 Diagnosis Description S81.811D Laceration without foreign body, right lower leg, subsequent encounter [id number] Non-pressure chronic ulcer of other part of right lower leg with fat layer I87.321 Chronic venous hypertension (idiopathic)  with inflammation of right lower Modifier: exposed extremity Quantity: Electronic Signature(s) Signed: 02/12/2020 7:42:01 AM By: Linton Ham MD Entered By: Linton Ham on 02/11/2020 16:59:05

## 2020-02-13 DIAGNOSIS — F0390 Unspecified dementia without behavioral disturbance: Secondary | ICD-10-CM | POA: Diagnosis not present

## 2020-02-13 DIAGNOSIS — L03115 Cellulitis of right lower limb: Secondary | ICD-10-CM | POA: Diagnosis not present

## 2020-02-13 DIAGNOSIS — I482 Chronic atrial fibrillation, unspecified: Secondary | ICD-10-CM | POA: Diagnosis not present

## 2020-02-13 LAB — CBC
HCT: 42 % (ref 36.0–46.0)
Hemoglobin: 14.2 g/dL (ref 12.0–15.0)
MCH: 32.2 pg (ref 26.0–34.0)
MCHC: 33.8 g/dL (ref 30.0–36.0)
MCV: 95.2 fL (ref 80.0–100.0)
Platelets: 310 10*3/uL (ref 150–400)
RBC: 4.41 MIL/uL (ref 3.87–5.11)
RDW: 12.7 % (ref 11.5–15.5)
WBC: 5.4 10*3/uL (ref 4.0–10.5)
nRBC: 0 % (ref 0.0–0.2)

## 2020-02-13 LAB — BASIC METABOLIC PANEL
Anion gap: 12 (ref 5–15)
BUN: 11 mg/dL (ref 8–23)
CO2: 24 mmol/L (ref 22–32)
Calcium: 8.7 mg/dL — ABNORMAL LOW (ref 8.9–10.3)
Chloride: 100 mmol/L (ref 98–111)
Creatinine, Ser: 0.6 mg/dL (ref 0.44–1.00)
GFR calc Af Amer: >60 mL/min (ref >60)
GFR calc non Af Amer: >60 mL/min (ref >60)
Glucose, Bld: 91 mg/dL (ref 70–99)
Potassium: 4.2 mmol/L (ref 3.5–5.1)
Sodium: 136 mmol/L (ref 135–145)

## 2020-02-13 LAB — CK: Total CK: 24 U/L — ABNORMAL LOW (ref 38–234)

## 2020-02-13 MED ORDER — ENSURE ENLIVE PO LIQD
237.0000 mL | Freq: Two times a day (BID) | ORAL | Status: DC
  Administered 2020-02-13 – 2020-02-19 (×10): 237 mL via ORAL

## 2020-02-13 MED ORDER — SODIUM CHLORIDE 0.9 % IV SOLN
INTRAVENOUS | Status: DC | PRN
  Administered 2020-02-13: 1000 mL via INTRAVENOUS

## 2020-02-13 MED ORDER — WARFARIN SODIUM 4 MG PO TABS
4.0000 mg | ORAL_TABLET | Freq: Once | ORAL | Status: AC
  Administered 2020-02-13: 4 mg via ORAL
  Filled 2020-02-13: qty 1

## 2020-02-13 MED ORDER — LINEZOLID 600 MG/300ML IV SOLN
600.0000 mg | Freq: Two times a day (BID) | INTRAVENOUS | Status: DC
  Administered 2020-02-13 – 2020-02-16 (×6): 600 mg via INTRAVENOUS
  Filled 2020-02-13 (×8): qty 300

## 2020-02-13 NOTE — Consult Note (Signed)
ANTICOAGULATION CONSULT NOTE - Initial Consult  Pharmacy Consult for Warfarin Indication: atrial fibrillation  No Known Allergies  Patient Measurements: Height: 5' (152.4 cm) Weight: 40.8 kg (90 lb) IBW/kg (Calculated) : 45.5  Vital Signs: Temp: 98.6 F (37 C) (04/22 2325) Temp Source: Oral (04/22 2325) BP: 138/93 (04/22 2325) Pulse Rate: 79 (04/22 2325)  Labs: Recent Labs    02/11/20 1716 02/11/20 1716 02/12/20 0443 02/12/20 1428 02/13/20 0413  HGB 16.4*   < > 13.9  --  14.2  HCT 46.1*  --  40.6  --  42.0  PLT 359  --  310  --  310  LABPROT 31.0*  --  33.2* 30.5*  --   INR 3.0*  --  3.3* 2.9*  --   CREATININE 0.57  --  0.49  --  0.60   < > = values in this interval not displayed.    Estimated Creatinine Clearance: 29.5 mL/min (by C-G formula based on SCr of 0.6 mg/dL).   Medical History: Past Medical History:  Diagnosis Date  . Atrial fibrillation ([place])   . 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 Hypertension   . Irregular heart beat   . Memory difficulty   . Meniere's disease     Medications:  PTA warfarin dose 5mg  qd - last taken 4/20 @ 1800  4/21 INR 3.0 - no warfarin 4/22 INR 3.3 - 3mg  4/23 INR 2.9  Assessment: Pharmacy has been consulted to resume warfarin dosing in 86 yo female with chronic a fib.  Goal of Therapy:  INR 2-3 Monitor platelets by anticoagulation protocol: Yes   Plan:  Pt therapeutic @ 2.9 - will give partial home dose (4mg ) as pt is on IV abx.     Will follow INR daily and CBC a minimum of every 3 days per protocol.  Lu Duffel, PharmD, BCPS Clinical Pharmacist 02/13/2020 8:10 AM

## 2020-02-13 NOTE — Progress Notes (Signed)
[facility] PROGRESS NOTE    Progress Note  SAMANTHE [name]  [id number] DOB: 26-Apr-1926 DOA: 02/11/2020 PCP: Derinda Late, MD     Brief Narrative:   [name] is an 86 y.o. female past medical history significant for chronic atrial fibrillation on Coumadin, dementia essential hypertension severe tricuspid and mitral insufficiency followed by cardiology was sent to the ED by wound care doctor, the patient's is due to wound to the right chin 3 weeks prior to admission which got infected and was manages an outpatient with surgical debridement on 02/04/2020 however despite this it became red painful and swollen, most of the history was obtained by the daughter-in-law as patient has advanced dementia.  Assessment/Plan:   Cellulitis of right leg with fat layer exposed: Patient with a necrotic wound surgery was consulted and family refused. surgical debridement She was started empirically on IV vancomycin and Rocephin wound care was consulted recommended sulfadiazine cream and continue dressing changes. Has remained afebrile without leukocytosis. Is about the same still necrotic the surrounding area is warm to touch. We will continue IV antibiotics for a minimum of 4 days. Continue dressing changes per wound care.  Essential hypertension: Continue Norvasc and losartan.  Chronic atrial fibrillation: She is not on any rate controlling agents resume Coumadin per pharmacy.  Tricuspid and mitral insufficiency: Noted.  Advanced dementia without behavioral disturbances: Continue Namenda.   DVT prophylaxis: Warfarin Family Communication: Daughter-in-law Status is: Inpatient  The patient will require care spanning > 2 midnights and should be moved to inpatient because: IV treatments appropriate due to intensity of illness or inability to take PO and Inpatient level of care appropriate due to severity of illness  Dispo: The patient is from: Home              Anticipated  d/c is to: Home              Anticipated d/c date is: 2 days              Patient currently is not medically stable to d/c.    Code Status:     Code Status Orders  (From admission, onward)         Start     Ordered   02/11/20 2207  Full code  Continuous     02/11/20 2208        Code Status History    This patient has a current code status but no historical code status.   Advance Care Planning Activity    Advance Directive Documentation     Most Recent Value  Type of Advance Directive  Healthcare Power of Attorney  Pre-existing out of facility DNR order (yellow form or pink MOST form)  --  "MOST" Form in Place?  --        IV Access:    Peripheral IV   Procedures and diagnostic studies:   DG Tibia/Fibula Right  Result Date: 02/11/2020 CLINICAL DATA:  Right leg pain over necrotic wound that resulted from blunt trauma. EXAM: RIGHT TIBIA AND FIBULA - 2 VIEW COMPARISON:  None. FINDINGS: The cortical margins of the tibia and fibular intact. There is no evidence of fracture or other focal bone lesions. No periosteal reaction or bony destructive change. Generalized soft tissue edema without soft tissue air or radiopaque foreign body. Vascular calcifications are seen. IMPRESSION: Soft tissue edema without soft tissue air or radiopaque foreign body. No radiographic findings of osteomyelitis. Electronically Signed   By: Aurther Loft.D.  On: 02/11/2020 21:15     Medical Consultants:    None.  Anti-Infectives:   rocephin and vancomycin  Subjective:    [name] she relates her pain is controlled.  Objective:    Vitals:   02/12/20 1026 02/12/20 1552 02/12/20 2325 02/13/20 0813  BP: (!) 153/97 (!) 134/94 (!) 138/93 (!) 162/90  Pulse: 89 83 79 77  Resp: 16 18 16 18   Temp: 97.8 F (36.6 C) 98.6 F (37 C) 98.6 F (37 C) 98.7 F (37.1 C)  TempSrc: Oral  Oral Oral  SpO2: 94% 100% 94% 93%  Weight:      Height:       SpO2: 93 %   Intake/Output  Summary (Last 24 hours) at 02/13/2020 1031 Last data filed at 02/13/2020 0700 Gross per 24 hour  Intake 1421.48 ml  Output --  Net 1421.48 ml   Filed Weights   02/11/20 1711  Weight: 40.8 kg    Exam: General exam: In no acute distress. Respiratory system: Good air movement and clear to auscultation. Cardiovascular system: S1 & S2 heard, RRR. No JVD. Gastrointestinal system: Abdomen is nondistended, soft and nontender.  Skin: Wrapping of the right chin, as the surgeon had evaluated it this morning and the nurse just wrapped it  Data Reviewed:    Labs: Basic Metabolic Panel: Recent Labs  Lab 02/11/20 1716 02/11/20 1716 02/12/20 0443 02/13/20 0413  NA 135  --  135 136  K 3.4*   < > 3.3* 4.2  CL 95*  --  97* 100  CO2 24  --  28 24  GLUCOSE 95  --  107* 91  BUN 10  --  9 11  CREATININE 0.57  --  0.49 0.60  CALCIUM 9.5  --  8.5* 8.7*   < > = values in this interval not displayed.   GFR Estimated Creatinine Clearance: 29.5 mL/min (by C-G formula based on SCr of 0.6 mg/dL). Liver Function Tests: Recent Labs  Lab 02/11/20 1716  AST 20  ALT 12  ALKPHOS 133*  BILITOT 1.0  PROT 8.0  ALBUMIN 4.2   No results for input(s): LIPASE, AMYLASE in the last 168 hours. No results for input(s): AMMONIA in the last 168 hours. Coagulation profile Recent Labs  Lab 02/11/20 1716 02/12/20 0443 02/12/20 1428  INR 3.0* 3.3* 2.9*   COVID-19 Labs  No results for input(s): DDIMER, FERRITIN, LDH, CRP in the last 72 hours.  Lab Results  Component Value Date   SARSCOV2NAA NEGATIVE 02/11/2020   [place] NEGATIVE 01/19/2020    CBC: Recent Labs  Lab 02/11/20 1716 02/12/20 0443 02/13/20 0413  WBC 6.2 5.0 5.4  NEUTROABS 3.4  --   --   HGB 16.4* 13.9 14.2  HCT 46.1* 40.6 42.0  MCV 91.7 94.4 95.2  PLT 359 310 310   Cardiac Enzymes: Recent Labs  Lab 02/13/20 0443  CKTOTAL 24*   BNP (last 3 results) No results for input(s): PROBNP in the last 8760 hours. CBG: No  results for input(s): GLUCAP in the last 168 hours. D-Dimer: No results for input(s): DDIMER in the last 72 hours. Hgb A1c: No results for input(s): HGBA1C in the last 72 hours. Lipid Profile: No results for input(s): CHOL, HDL, LDLCALC, TRIG, CHOLHDL, LDLDIRECT in the last 72 hours. Thyroid function studies: No results for input(s): TSH, T4TOTAL, T3FREE, THYROIDAB in the last 72 hours.  Invalid input(s): FREET3 Anemia work up: No results for input(s): VITAMINB12, FOLATE, FERRITIN, TIBC, IRON, RETICCTPCT in  the last 72 hours. Sepsis Labs: Recent Labs  Lab 02/11/20 1716 02/12/20 0443 02/13/20 0413  WBC 6.2 5.0 5.4  LATICACIDVEN 1.3  --   --    Microbiology Recent Results (from the past 240 hour(s))  Culture, blood (Routine x 2)     Status: None (Preliminary result)   Collection Time: 02/11/20  5:16 PM   Specimen: BLOOD  Result Value Ref Range Status   Specimen Description BLOOD BLOOD RIGHT FOREARM  Final   Special Requests   Final    BOTTLES DRAWN AEROBIC AND ANAEROBIC Blood Culture adequate volume   Culture   Final    NO GROWTH 2 DAYS Performed at [facility], [address], [place], [place] [address]    Report Status PENDING  Incomplete  Culture, blood (Routine x 2)     Status: None (Preliminary result)   Collection Time: 02/11/20  5:16 PM   Specimen: BLOOD  Result Value Ref Range Status   Specimen Description   Final    BLOOD Blood Culture results may not be optimal due to an inadequate volume of blood received in culture bottles   Special Requests   Final    BOTTLES DRAWN AEROBIC AND ANAEROBIC RIGHT ANTECUBITAL   Culture   Final    NO GROWTH 2 DAYS Performed at [facility], [address] [place], [place] [address]    Report Status PENDING  Incomplete  SARS CORONAVIRUS 2 (TAT 6-24 HRS) Nasopharyngeal Nasopharyngeal Swab     Status: None   Collection Time: 02/11/20  9:25 PM   Specimen: Nasopharyngeal Swab  Result Value Ref Range Status    SARS Coronavirus 2 NEGATIVE NEGATIVE Final    Comment: (NOTE) SARS-CoV-2 target nucleic acids are NOT DETECTED. The SARS-CoV-2 RNA is generally detectable in upper and lower respiratory specimens during the acute phase of infection. Negative results do not preclude SARS-CoV-2 infection, do not rule out co-infections with other pathogens, and should not be used as the sole basis for treatment or other patient management decisions. Negative results must be combined with clinical observations, patient history, and epidemiological information. The expected result is Negative. Fact Sheet for Patients: SugarRoll.be Fact Sheet for Healthcare Providers: https://www.woods-mathews.com/ This test is not yet approved or cleared by the [place] FDA and  has been authorized for detection and/or diagnosis of SARS-CoV-2 by FDA under an Emergency Use Authorization (EUA). This EUA will remain  in effect (meaning this test can be used) for the duration of the COVID-19 declaration under Section 56 4(b)(1) of the Act, 21 U.S.C. section 360bbb-3(b)(1), unless the authorization is terminated or revoked sooner. Performed at [facility], [place] [address], [place], [place] [address]      Medications:   . amLODipine  5 mg Oral QPM  . losartan  50 mg Oral QPM  . memantine  5 mg Oral BID  . silver sulfADIAZINE   Topical BID  . warfarin  4 mg Oral ONCE-1600  . Warfarin - Pharmacist Dosing Inpatient   Does not apply q1600   Continuous Infusions: . sodium chloride 75 mL/hr at 02/13/20 0404  . cefTRIAXone (ROCEPHIN)  IV 1 g (02/12/20 1752)  . linezolid (ZYVOX) IV        LOS: 1 day   Charlynne Cousins  [facility]  02/13/2020, 10:31 AM

## 2020-02-13 NOTE — TOC Progression Note (Addendum)
Transition of Care [facility]) - Progression Note    Patient Details  Name: [name] MRN: [id number] Date of Birth: 12-03-1925  Transition of Care [facility]) CM/SW Contact  Gabryella Murfin, Gardiner Rhyme, LCSW Phone Number: 02/13/2020, 10:41 AM  Clinical Narrative:   Damaris Schooner with MD who reports we'll watch wound over the weekend and see on Monday how it is doing. May need home IV antibiotics and dressing changes at home. Will continue to work on discharge needs.  12:45 Gave [facility] infusion heads up in case needed  Expected Discharge Plan: Home/Self Care Barriers to Discharge: Continued Medical Work up  Expected Discharge Plan and Services Expected Discharge Plan: Home/Self Care In-house Referral: Clinical Social Work     Living arrangements for the past 2 months: Single Family Home                                       Social Determinants of Health (SDOH) Interventions    Readmission Risk Interventions No flowsheet data found.

## 2020-02-13 NOTE — Progress Notes (Signed)
Physical Therapy Treatment Patient Details Name: [name] MRN: [id number] DOB: 1926/03/05 Today's Date: 02/13/2020    History of Present Illness Pt admitted for R LE cellulitis secondary to wound from mechanical injury. History of Afib, dementia, and HTN.    PT Comments    Pt is making good progress towards goals with ability to ambulate in hallway and up/down stairs. Able ambulate to bathroom. Limited WBing noted on R LE due to pain. Pt continues to be confused. Will continue to progress as able.   Follow Up Recommendations  No PT follow up     Equipment Recommendations  None recommended by PT    Recommendations for Other Services       Precautions / Restrictions Precautions Precautions: Fall Restrictions Weight Bearing Restrictions: No    Mobility  Bed Mobility Overal bed mobility: Independent             General bed mobility comments: safe technique with upright posture once seated  Transfers Overall transfer level: Modified independent Equipment used: Rolling walker (2 wheeled)             General transfer comment: safe technique with upright posture. Decreased WBing noted on R LE with OOB mobility  Ambulation/Gait Ambulation/Gait assistance: Min guard Gait Distance (Feet): 100 Feet Assistive device: Rolling walker (2 wheeled) Gait Pattern/deviations: Step-to pattern     General Gait Details: ambulated in hallway with cues for use of RW. No LOB noted, just complains of pain with WBing.    Stairs Stairs: Yes Stairs assistance: Min guard Stair Management: Two rails;Step to pattern Number of Stairs: 4 General stair comments: ambulated up/down steps with B railing safely with step to gait pattern. Demonstrated technique prior to performance.   Wheelchair Mobility    Modified Rankin (Stroke Patients Only)       Balance Overall balance assessment: Mild deficits observed, not formally tested                                           Cognition Arousal/Alertness: Awake/alert Behavior During Therapy: WFL for tasks assessed/performed Overall Cognitive Status: Impaired/Different from baseline                                 General Comments: Pt is confused stating she is at home and is confused on why she is having R LE pain      Exercises Other Exercises Other Exercises: Pt ambulated to bathroom, able to use low toilet safely and perform hygiene at sink without LOB. Does need cues for initiation and is somewhat impulsive with tasks needing cues for pulling up on grab bar instead of RW.    General Comments        Pertinent Vitals/Pain Pain Assessment: 0-10 Pain Score: 9  Pain Location: R LE Pain Descriptors / Indicators: Discomfort;Constant Pain Intervention(s): Limited activity within patient's tolerance;Patient requesting pain meds-RN notified    Home Living                      Prior Function            PT Goals (current goals can now be found in the care plan section) Acute Rehab PT Goals Patient Stated Goal: to go home PT Goal Formulation: With patient Time For Goal Achievement: 02/26/20 Potential to Achieve  Goals: Good Progress towards PT goals: Progressing toward goals    Frequency    Min 2X/week      PT Plan Current plan remains appropriate    Co-evaluation              AM-PAC PT "6 Clicks" Mobility   Outcome Measure  Help needed turning from your back to your side while in a flat bed without using bedrails?: None Help needed moving from lying on your back to sitting on the side of a flat bed without using bedrails?: None Help needed moving to and from a bed to a chair (including a wheelchair)?: None Help needed standing up from a chair using your arms (e.g., wheelchair or bedside chair)?: None Help needed to walk in hospital room?: A Little Help needed climbing 3-5 steps with a railing? : A Little 6 Click Score: 22    End of Session  Equipment Utilized During Treatment: Gait belt Activity Tolerance: Patient limited by pain Patient left: in bed;with bed alarm set Nurse Communication: Mobility status PT Visit Diagnosis: Difficulty in walking, not elsewhere classified (R26.2);Pain Pain - Right/Left: Right Pain - part of body: Leg     Time: [id number] PT Time Calculation (min) (ACUTE ONLY): 26 min  Charges:  $Gait Training: 8-22 mins $Therapeutic Activity: 8-22 mins                     Greggory Stallion, PT, DPT [phone]    Merryl Buckels 02/13/2020, 4:42 PM

## 2020-02-14 DIAGNOSIS — L03115 Cellulitis of right lower limb: Secondary | ICD-10-CM | POA: Diagnosis not present

## 2020-02-14 DIAGNOSIS — I482 Chronic atrial fibrillation, unspecified: Secondary | ICD-10-CM | POA: Diagnosis not present

## 2020-02-14 DIAGNOSIS — F0390 Unspecified dementia without behavioral disturbance: Secondary | ICD-10-CM | POA: Diagnosis not present

## 2020-02-14 LAB — PROTIME-INR
INR: 3.3 — ABNORMAL HIGH (ref 0.8–1.2)
Prothrombin Time: 33.2 seconds — ABNORMAL HIGH (ref 11.4–15.2)

## 2020-02-14 MED ORDER — WARFARIN SODIUM 2.5 MG PO TABS
2.5000 mg | ORAL_TABLET | Freq: Once | ORAL | Status: AC
  Administered 2020-02-14: 2.5 mg via ORAL
  Filled 2020-02-14: qty 1

## 2020-02-14 NOTE — Consult Note (Addendum)
[facility] [place] for Warfarin Indication: atrial fibrillation  No Known Allergies  Patient Measurements: Height: 5' (152.4 cm) Weight: 40.8 kg (90 lb) IBW/kg (Calculated) : 45.5  Vital Signs: Temp: 98.4 F (36.9 C) (04/23 2345) BP: 137/89 (04/23 2345) Pulse Rate: 77 (04/23 2345)  Labs: Recent Labs    02/11/20 1716 02/11/20 1716 02/12/20 0443 02/12/20 1428 02/13/20 0413 02/13/20 0443  HGB 16.4*   < > 13.9  --  14.2  --   HCT 46.1*  --  40.6  --  42.0  --   PLT 359  --  310  --  310  --   LABPROT 31.0*  --  33.2* 30.5*  --   --   INR 3.0*  --  3.3* 2.9*  --   --   CREATININE 0.57  --  0.49  --  0.60  --   CKTOTAL  --   --   --   --   --  24*   < > = values in this interval not displayed.    Estimated Creatinine Clearance: 29.5 mL/min (by C-G formula based on SCr of 0.6 mg/dL).   Medical History: Past Medical History:  Diagnosis Date  . Atrial fibrillation ([place])   . 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 Hypertension   . Irregular heart beat   . Memory difficulty   . Meniere's disease     Medications:  PTA warfarin dose 5mg  qd - last taken 4/20 @ 1800  4/21 INR 3.0 - no warfarin 4/22 INR 0443= 3.3 - 3mg  4/22 INR 1428= 2.9   4 mg 4/23 no INR          4 mg 4/24 INR 3.3  Assessment: Pharmacy has been consulted to resume warfarin dosing in 86 yo female with chronic a fib.  Goal of Therapy:  INR 2-3 Monitor platelets by anticoagulation protocol: Yes   Plan:  Will order lower dose of Warfarin 2.5 mg x 1 tonight DDI- pt on CTX,Zyvox.  Hgb 14.2  Plt 310 Will follow INR daily and CBC a minimum of every 3 days per protocol.  Noralee Space, PharmD, BCPS Clinical Pharmacist 02/14/2020 9:32 AM

## 2020-02-14 NOTE — Progress Notes (Signed)
[facility] PROGRESS NOTE    Progress Note  [name]  [id number] DOB: 1926/07/15 DOA: 02/11/2020 PCP: Derinda Late, MD     Brief Narrative:   [name] is an 86 y.o. female past medical history significant for chronic atrial fibrillation on Coumadin, dementia essential hypertension severe tricuspid and mitral insufficiency followed by cardiology was sent to the ED by wound care doctor, the patient's is due to wound to the right chin 3 weeks prior to admission which got infected and was manages an outpatient with surgical debridement on 02/04/2020 however despite this it became red painful and swollen, most of the history was obtained by the daughter-in-law as patient has advanced dementia.  Assessment/Plan:   Cellulitis of right leg with fat layer exposed: Patient with a necrotic wound surgery was consulted and family refused surgical debridement. We will continue chemical debridement, continue IV linezolid and Rocephin. Wound care following continue daily dressing changes, she has remained afebrile with no leukocytosis.  The surrounding erythema is improved. Will need at least 2 more days of IV antibiotics.  Essential hypertension: Continue Norvasc and losartan.  Chronic atrial fibrillation: She is not on any rate controlling agents resume Coumadin per pharmacy.  Tricuspid and mitral insufficiency: Noted.  Advanced dementia without behavioral disturbances: Continue Namenda.   DVT prophylaxis: Warfarin Family Communication: Daughter-in-law Status is: Inpatient  The patient will require care spanning > 2 midnights and should be moved to inpatient because: IV treatments appropriate due to intensity of illness or inability to take PO and Inpatient level of care appropriate due to severity of illness  Dispo: The patient is from: Home              Anticipated d/c is to: Home              Anticipated d/c date is: 2 days              Patient currently is  not medically stable to d/c.    Code Status:     Code Status Orders  (From admission, onward)         Start     Ordered   02/11/20 2207  Full code  Continuous     02/11/20 2208        Code Status History    This patient has a current code status but no historical code status.   Advance Care Planning Activity    Advance Directive Documentation     Most Recent Value  Type of Advance Directive  Healthcare Power of Attorney  Pre-existing out of facility DNR order (yellow form or pink MOST form)  --  "MOST" Form in Place?  --        IV Access:    Peripheral IV   Procedures and diagnostic studies:   No results found.   Medical Consultants:    None.  Anti-Infectives:   rocephin and vancomycin  Subjective:    [name] relates her leg still hurts but not as bad as when she came in.  Objective:    Vitals:   02/13/20 0813 02/13/20 1603 02/13/20 2345 02/14/20 1000  BP: (!) 162/90 (!) 181/93 137/89 (!) 145/80  Pulse: 77 92 77 74  Resp: 18 18 16 18   Temp: 98.7 F (37.1 C) 98.1 F (36.7 C) 98.4 F (36.9 C) 98 F (36.7 C)  TempSrc: Oral Oral  Oral  SpO2: 93% 92% 95% 92%  Weight:      Height:  SpO2: 92 %   Intake/Output Summary (Last 24 hours) at 02/14/2020 1019 Last data filed at 02/14/2020 1017 Gross per 24 hour  Intake 1116.54 ml  Output --  Net 1116.54 ml   Filed Weights   02/11/20 1711  Weight: 40.8 kg    Exam: General exam: In no acute distress. Respiratory system: Good air movement and clear to auscultation. Cardiovascular system: S1 & S2 heard, RRR. No JVD. Gastrointestinal system: Abdomen is nondistended, soft and nontender.  Extremities: No lower extremity edema. Skin: No rashes, lesions or ulcers Psychiatry: Judgement and insight appear normal. Mood & affect appropriate. Skin: Wrapping of the right chin, as the surgeon had evaluated it this morning and the nurse just wrapped it  Data Reviewed:    Labs: Basic  Metabolic Panel: Recent Labs  Lab 02/11/20 1716 02/11/20 1716 02/12/20 0443 02/13/20 0413  NA 135  --  135 136  K 3.4*   < > 3.3* 4.2  CL 95*  --  97* 100  CO2 24  --  28 24  GLUCOSE 95  --  107* 91  BUN 10  --  9 11  CREATININE 0.57  --  0.49 0.60  CALCIUM 9.5  --  8.5* 8.7*   < > = values in this interval not displayed.   GFR Estimated Creatinine Clearance: 29.5 mL/min (by C-G formula based on SCr of 0.6 mg/dL). Liver Function Tests: Recent Labs  Lab 02/11/20 1716  AST 20  ALT 12  ALKPHOS 133*  BILITOT 1.0  PROT 8.0  ALBUMIN 4.2   No results for input(s): LIPASE, AMYLASE in the last 168 hours. No results for input(s): AMMONIA in the last 168 hours. Coagulation profile Recent Labs  Lab 02/11/20 1716 02/12/20 0443 02/12/20 1428  INR 3.0* 3.3* 2.9*   COVID-19 Labs  No results for input(s): DDIMER, FERRITIN, LDH, CRP in the last 72 hours.  Lab Results  Component Value Date   SARSCOV2NAA NEGATIVE 02/11/2020   [place] NEGATIVE 01/19/2020    CBC: Recent Labs  Lab 02/11/20 1716 02/12/20 0443 02/13/20 0413  WBC 6.2 5.0 5.4  NEUTROABS 3.4  --   --   HGB 16.4* 13.9 14.2  HCT 46.1* 40.6 42.0  MCV 91.7 94.4 95.2  PLT 359 310 310   Cardiac Enzymes: Recent Labs  Lab 02/13/20 0443  CKTOTAL 24*   BNP (last 3 results) No results for input(s): PROBNP in the last 8760 hours. CBG: No results for input(s): GLUCAP in the last 168 hours. D-Dimer: No results for input(s): DDIMER in the last 72 hours. Hgb A1c: No results for input(s): HGBA1C in the last 72 hours. Lipid Profile: No results for input(s): CHOL, HDL, LDLCALC, TRIG, CHOLHDL, LDLDIRECT in the last 72 hours. Thyroid function studies: No results for input(s): TSH, T4TOTAL, T3FREE, THYROIDAB in the last 72 hours.  Invalid input(s): FREET3 Anemia work up: No results for input(s): VITAMINB12, FOLATE, FERRITIN, TIBC, IRON, RETICCTPCT in the last 72 hours. Sepsis Labs: Recent Labs  Lab  02/11/20 1716 02/12/20 0443 02/13/20 0413  WBC 6.2 5.0 5.4  LATICACIDVEN 1.3  --   --    Microbiology Recent Results (from the past 240 hour(s))  Culture, blood (Routine x 2)     Status: None (Preliminary result)   Collection Time: 02/11/20  5:16 PM   Specimen: BLOOD  Result Value Ref Range Status   Specimen Description BLOOD BLOOD RIGHT FOREARM  Final   Special Requests   Final    BOTTLES DRAWN AEROBIC  AND ANAEROBIC Blood Culture adequate volume   Culture   Final    NO GROWTH 3 DAYS Performed at [facility], [address] [place], [place] [address]    Report Status PENDING  Incomplete  Culture, blood (Routine x 2)     Status: None (Preliminary result)   Collection Time: 02/11/20  5:16 PM   Specimen: BLOOD  Result Value Ref Range Status   Specimen Description   Final    BLOOD Blood Culture results may not be optimal due to an inadequate volume of blood received in culture bottles   Special Requests   Final    BOTTLES DRAWN AEROBIC AND ANAEROBIC RIGHT ANTECUBITAL   Culture   Final    NO GROWTH 3 DAYS Performed at [facility], [address], [place],  [address]    Report Status PENDING  Incomplete  SARS CORONAVIRUS 2 (TAT 6-24 HRS) Nasopharyngeal Nasopharyngeal Swab     Status: None   Collection Time: 02/11/20  9:25 PM   Specimen: Nasopharyngeal Swab  Result Value Ref Range Status   SARS Coronavirus 2 NEGATIVE NEGATIVE Final    Comment: (NOTE) SARS-CoV-2 target nucleic acids are NOT DETECTED. The SARS-CoV-2 RNA is generally detectable in upper and lower respiratory specimens during the acute phase of infection. Negative results do not preclude SARS-CoV-2 infection, do not rule out co-infections with other pathogens, and should not be used as the sole basis for treatment or other patient management decisions. Negative results must be combined with clinical observations, patient history, and epidemiological information. The expected result  is Negative. Fact Sheet for Patients: SugarRoll.be Fact Sheet for Healthcare Providers: https://www.woods-mathews.com/ This test is not yet approved or cleared by the [place] FDA and  has been authorized for detection and/or diagnosis of SARS-CoV-2 by FDA under an Emergency Use Authorization (EUA). This EUA will remain  in effect (meaning this test can be used) for the duration of the COVID-19 declaration under Section 56 4(b)(1) of the Act, 21 U.S.C. section 360bbb-3(b)(1), unless the authorization is terminated or revoked sooner. Performed at [facility], [place] [address] [place], [place] [address]      Medications:   . amLODipine  5 mg Oral QPM  . feeding supplement (ENSURE ENLIVE)  237 mL Oral BID BM  . losartan  50 mg Oral QPM  . memantine  5 mg Oral BID  . silver sulfADIAZINE   Topical BID  . Warfarin - Pharmacist Dosing Inpatient   Does not apply q1600   Continuous Infusions: . sodium chloride 1,000 mL (02/13/20 1620)  . cefTRIAXone (ROCEPHIN)  IV 1 g (02/13/20 1717)  . linezolid (ZYVOX) IV 600 mg (02/14/20 0959)      LOS: 2 days   Charlynne Cousins  [facility]  02/14/2020, 10:19 AM

## 2020-02-15 DIAGNOSIS — F0390 Unspecified dementia without behavioral disturbance: Secondary | ICD-10-CM | POA: Diagnosis not present

## 2020-02-15 DIAGNOSIS — I482 Chronic atrial fibrillation, unspecified: Secondary | ICD-10-CM | POA: Diagnosis not present

## 2020-02-15 DIAGNOSIS — L03115 Cellulitis of right lower limb: Secondary | ICD-10-CM | POA: Diagnosis not present

## 2020-02-15 LAB — PROTIME-INR
INR: 3 — ABNORMAL HIGH (ref 0.8–1.2)
Prothrombin Time: 31 seconds — ABNORMAL HIGH (ref 11.4–15.2)

## 2020-02-15 MED ORDER — WARFARIN SODIUM 2.5 MG PO TABS
2.5000 mg | ORAL_TABLET | Freq: Once | ORAL | Status: AC
  Administered 2020-02-15: 2.5 mg via ORAL
  Filled 2020-02-15: qty 1

## 2020-02-15 MED ORDER — SODIUM CHLORIDE 0.9 % IV SOLN
1.0000 g | Freq: Two times a day (BID) | INTRAVENOUS | Status: DC
  Administered 2020-02-15 – 2020-02-17 (×6): 1 g via INTRAVENOUS
  Filled 2020-02-15 (×8): qty 1

## 2020-02-15 NOTE — Consult Note (Signed)
WOC consulted for leg wound, see consultation not from 02/12/20. Verified orders in the computer.    Thanks  Francia Verry [employer], RN,CWOCN, CNS, [place] [phone])

## 2020-02-15 NOTE — Consult Note (Signed)
[place] for Warfarin Indication: atrial fibrillation  No Known Allergies  Patient Measurements: Height: 5' (152.4 cm) Weight: 40.8 kg (90 lb) IBW/kg (Calculated) : 45.5  Vital Signs: Temp: 97.8 F (36.6 C) (04/25 0828) Temp Source: Oral (04/25 0828) BP: 163/98 (04/25 0828) Pulse Rate: 90 (04/25 0828)  Labs: Recent Labs    02/12/20 1428 02/13/20 0413 02/13/20 0443 02/14/20 1000 02/15/20 0501  HGB  --  14.2  --   --   --   HCT  --  42.0  --   --   --   PLT  --  310  --   --   --   LABPROT 30.5*  --   --  33.2* 31.0*  INR 2.9*  --   --  3.3* 3.0*  CREATININE  --  0.60  --   --   --   CKTOTAL  --   --  24*  --   --     Estimated Creatinine Clearance: 29.5 mL/min (by C-G formula based on SCr of 0.6 mg/dL).   Medical History: Past Medical History:  Diagnosis Date  . Atrial fibrillation ([place])   . 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 Hypertension   . Irregular heart beat   . Memory difficulty   . Meniere's disease     Medications:  PTA warfarin dose 5mg  qd - last taken 4/20 @ 1800  4/21 INR 3.0 - no warfarin 4/22 INR 0443= 3.3 - 3mg  4/22 INR 1428= 2.9   4 mg 4/23 no INR       4 mg 4/24 INR 3.3      2.5 mg 4/25 INR 3.0  Assessment: Pharmacy has been consulted to resume warfarin dosing in 86 yo female with chronic a fib.  Goal of Therapy:  INR 2-3 Monitor platelets by anticoagulation protocol: Yes   Plan:  Will order  Warfarin 2.5 mg x 1 tonight DDI- pt on CTX,Zyvox.   Will follow INR daily and CBC a minimum of every 3 days per protocol.  Noralee Space, PharmD, BCPS Clinical Pharmacist 02/15/2020 11:09 AM

## 2020-02-15 NOTE — Progress Notes (Signed)
[facility] PROGRESS NOTE    Progress Note  [name]  [id number] DOB: 04/20/1926 DOA: 02/11/2020 PCP: Derinda Late, MD     Brief Narrative:   [name] is an 86 y.o. female past medical history significant for chronic atrial fibrillation on Coumadin, dementia essential hypertension severe tricuspid and mitral insufficiency followed by cardiology was sent to the ED by wound care doctor, the patient's is due to wound to the right chin 3 weeks prior to admission which got infected and was manages an outpatient with surgical debridement on 02/04/2020 however despite this it became red painful and swollen, most of the history was obtained by the daughter-in-law as patient has advanced dementia.  Assessment/Plan:   Cellulitis of right leg with fat layer exposed: Patient with a necrotic wound surgery was consulted and family refused surgical debridement. We will continue chemical debridement, continue IV linezolid and cefepime Surgery recommended to continue conservative management, will have wound care evaluate the wound. She still has significant cellulitis with erythema tender to touch no improvement compared to yesterday.  Essential hypertension: Continue Norvasc and losartan.  Chronic atrial fibrillation: She is not on any rate controlling agents resume Coumadin per pharmacy.  Tricuspid and mitral insufficiency: Noted.  Advanced dementia without behavioral disturbances: Continue Namenda.   DVT prophylaxis: Warfarin Family Communication: Daughter-in-law Status is: Inpatient  The patient will require care spanning > 2 midnights and should be moved to inpatient because: IV treatments appropriate due to intensity of illness or inability to take PO and Inpatient level of care appropriate due to severity of illness  Dispo: The patient is from: Home              Anticipated d/c is to: Home              Anticipated d/c date is: 2 days              Patient  currently is not medically stable to d/c.    Code Status:     Code Status Orders  (From admission, onward)         Start     Ordered   02/11/20 2207  Full code  Continuous     02/11/20 2208        Code Status History    This patient has a current code status but no historical code status.   Advance Care Planning Activity    Advance Directive Documentation     Most Recent Value  Type of Advance Directive  Healthcare Power of Attorney  Pre-existing out of facility DNR order (yellow form or pink MOST form)  --  "MOST" Form in Place?  --        IV Access:    Peripheral IV   Procedures and diagnostic studies:   No results found.   Medical Consultants:    None.  Anti-Infectives:   rocephin and vancomycin  Subjective:    [name] relates her leg still hurts.  Objective:    Vitals:   02/14/20 1000 02/14/20 1828 02/14/20 2353 02/15/20 0828  BP: (!) 145/80 (!) 172/99 (!) 157/89 (!) 163/98  Pulse: 74 96 91 90  Resp: 18 16 16 16   Temp: 98 F (36.7 C) 98.6 F (37 C) 97.9 F (36.6 C) 97.8 F (36.6 C)  TempSrc: Oral  Oral Oral  SpO2: 92% 91% 91% 92%  Weight:      Height:       SpO2: 92 %   Intake/Output  Summary (Last 24 hours) at 02/15/2020 0954 Last data filed at 02/15/2020 0300 Gross per 24 hour  Intake 1170.06 ml  Output --  Net 1170.06 ml   Filed Weights   02/11/20 1711  Weight: 40.8 kg    Exam: General exam: In no acute distress. Respiratory system: Good air movement and clear to auscultation. Cardiovascular system: S1 & S2 heard, RRR. No JVD. Gastrointestinal system: Abdomen is nondistended, soft and nontender.  Central nervous system: Alert and oriented. No focal neurological deficits. Skin: Looks erythematous tender to touch and warm no significant improvement compared to yesterday.  Data Reviewed:    Labs: Basic Metabolic Panel: Recent Labs  Lab 02/11/20 1716 02/11/20 1716 02/12/20 0443 02/13/20 0413  NA 135   --  135 136  K 3.4*   < > 3.3* 4.2  CL 95*  --  97* 100  CO2 24  --  28 24  GLUCOSE 95  --  107* 91  BUN 10  --  9 11  CREATININE 0.57  --  0.49 0.60  CALCIUM 9.5  --  8.5* 8.7*   < > = values in this interval not displayed.   GFR Estimated Creatinine Clearance: 29.5 mL/min (by C-G formula based on SCr of 0.6 mg/dL). Liver Function Tests: Recent Labs  Lab 02/11/20 1716  AST 20  ALT 12  ALKPHOS 133*  BILITOT 1.0  PROT 8.0  ALBUMIN 4.2   No results for input(s): LIPASE, AMYLASE in the last 168 hours. No results for input(s): AMMONIA in the last 168 hours. Coagulation profile Recent Labs  Lab 02/11/20 1716 02/12/20 0443 02/12/20 1428 02/14/20 1000 02/15/20 0501  INR 3.0* 3.3* 2.9* 3.3* 3.0*   COVID-19 Labs  No results for input(s): DDIMER, FERRITIN, LDH, CRP in the last 72 hours.  Lab Results  Component Value Date   SARSCOV2NAA NEGATIVE 02/11/2020   [place] NEGATIVE 01/19/2020    CBC: Recent Labs  Lab 02/11/20 1716 02/12/20 0443 02/13/20 0413  WBC 6.2 5.0 5.4  NEUTROABS 3.4  --   --   HGB 16.4* 13.9 14.2  HCT 46.1* 40.6 42.0  MCV 91.7 94.4 95.2  PLT 359 310 310   Cardiac Enzymes: Recent Labs  Lab 02/13/20 0443  CKTOTAL 24*   BNP (last 3 results) No results for input(s): PROBNP in the last 8760 hours. CBG: No results for input(s): GLUCAP in the last 168 hours. D-Dimer: No results for input(s): DDIMER in the last 72 hours. Hgb A1c: No results for input(s): HGBA1C in the last 72 hours. Lipid Profile: No results for input(s): CHOL, HDL, LDLCALC, TRIG, CHOLHDL, LDLDIRECT in the last 72 hours. Thyroid function studies: No results for input(s): TSH, T4TOTAL, T3FREE, THYROIDAB in the last 72 hours.  Invalid input(s): FREET3 Anemia work up: No results for input(s): VITAMINB12, FOLATE, FERRITIN, TIBC, IRON, RETICCTPCT in the last 72 hours. Sepsis Labs: Recent Labs  Lab 02/11/20 1716 02/12/20 0443 02/13/20 0413  WBC 6.2 5.0 5.4   LATICACIDVEN 1.3  --   --    Microbiology Recent Results (from the past 240 hour(s))  Culture, blood (Routine x 2)     Status: None (Preliminary result)   Collection Time: 02/11/20  5:16 PM   Specimen: BLOOD  Result Value Ref Range Status   Specimen Description BLOOD BLOOD RIGHT FOREARM  Final   Special Requests   Final    BOTTLES DRAWN AEROBIC AND ANAEROBIC Blood Culture adequate volume   Culture   Final    NO GROWTH  4 DAYS Performed at [facility], [address] [place], [place] [address]    Report Status PENDING  Incomplete  Culture, blood (Routine x 2)     Status: None (Preliminary result)   Collection Time: 02/11/20  5:16 PM   Specimen: BLOOD  Result Value Ref Range Status   Specimen Description   Final    BLOOD Blood Culture results may not be optimal due to an inadequate volume of blood received in culture bottles   Special Requests   Final    BOTTLES DRAWN AEROBIC AND ANAEROBIC RIGHT ANTECUBITAL   Culture   Final    NO GROWTH 4 DAYS Performed at [facility], [address], [place], [place] [address]    Report Status PENDING  Incomplete  SARS CORONAVIRUS 2 (TAT 6-24 HRS) Nasopharyngeal Nasopharyngeal Swab     Status: None   Collection Time: 02/11/20  9:25 PM   Specimen: Nasopharyngeal Swab  Result Value Ref Range Status   SARS Coronavirus 2 NEGATIVE NEGATIVE Final    Comment: (NOTE) SARS-CoV-2 target nucleic acids are NOT DETECTED. The SARS-CoV-2 RNA is generally detectable in upper and lower respiratory specimens during the acute phase of infection. Negative results do not preclude SARS-CoV-2 infection, do not rule out co-infections with other pathogens, and should not be used as the sole basis for treatment or other patient management decisions. Negative results must be combined with clinical observations, patient history, and epidemiological information. The expected result is Negative. Fact Sheet for  Patients: SugarRoll.be Fact Sheet for Healthcare Providers: https://www.woods-mathews.com/ This test is not yet approved or cleared by the [place] FDA and  has been authorized for detection and/or diagnosis of SARS-CoV-2 by FDA under an Emergency Use Authorization (EUA). This EUA will remain  in effect (meaning this test can be used) for the duration of the COVID-19 declaration under Section 56 4(b)(1) of the Act, 21 U.S.C. section 360bbb-3(b)(1), unless the authorization is terminated or revoked sooner. Performed at [facility], [place] [address], [place], [place] [address]      Medications:   . amLODipine  5 mg Oral QPM  . feeding supplement (ENSURE ENLIVE)  237 mL Oral BID BM  . losartan  50 mg Oral QPM  . memantine  5 mg Oral BID  . silver sulfADIAZINE   Topical BID  . Warfarin - Pharmacist Dosing Inpatient   Does not apply q1600   Continuous Infusions: . sodium chloride 1,000 mL (02/13/20 1620)  . cefTRIAXone (ROCEPHIN)  IV Stopped (02/14/20 1907)  . linezolid (ZYVOX) IV Stopped (02/14/20 2329)      LOS: 3 days   Charlynne Cousins  [facility]  02/15/2020, 9:54 AM

## 2020-02-16 DIAGNOSIS — I482 Chronic atrial fibrillation, unspecified: Secondary | ICD-10-CM | POA: Diagnosis not present

## 2020-02-16 DIAGNOSIS — L03115 Cellulitis of right lower limb: Secondary | ICD-10-CM | POA: Diagnosis not present

## 2020-02-16 DIAGNOSIS — F0390 Unspecified dementia without behavioral disturbance: Secondary | ICD-10-CM | POA: Diagnosis not present

## 2020-02-16 LAB — BASIC METABOLIC PANEL
Anion gap: 9 (ref 5–15)
BUN: 8 mg/dL (ref 8–23)
CO2: 27 mmol/L (ref 22–32)
Calcium: 8.4 mg/dL — ABNORMAL LOW (ref 8.9–10.3)
Chloride: 95 mmol/L — ABNORMAL LOW (ref 98–111)
Creatinine, Ser: 0.55 mg/dL (ref 0.44–1.00)
GFR calc Af Amer: >60 mL/min (ref >60)
GFR calc non Af Amer: >60 mL/min (ref >60)
Glucose, Bld: 93 mg/dL (ref 70–99)
Potassium: 3.4 mmol/L — ABNORMAL LOW (ref 3.5–5.1)
Sodium: 131 mmol/L — ABNORMAL LOW (ref 135–145)

## 2020-02-16 LAB — CBC
HCT: 43.6 % (ref 36.0–46.0)
Hemoglobin: 14.7 g/dL (ref 12.0–15.0)
MCH: 32.8 pg (ref 26.0–34.0)
MCHC: 33.7 g/dL (ref 30.0–36.0)
MCV: 97.3 fL (ref 80.0–100.0)
Platelets: 303 10*3/uL (ref 150–400)
RBC: 4.48 MIL/uL (ref 3.87–5.11)
RDW: 12.4 % (ref 11.5–15.5)
WBC: 4.3 10*3/uL (ref 4.0–10.5)
nRBC: 0 % (ref 0.0–0.2)

## 2020-02-16 LAB — PROTIME-INR
INR: 3 — ABNORMAL HIGH (ref 0.8–1.2)
Prothrombin Time: 31.2 seconds — ABNORMAL HIGH (ref 11.4–15.2)

## 2020-02-16 LAB — CULTURE, BLOOD (ROUTINE X 2)
Culture: NO GROWTH
Culture: NO GROWTH
Special Requests: ADEQUATE

## 2020-02-16 MED ORDER — HYDRALAZINE HCL 10 MG PO TABS
10.0000 mg | ORAL_TABLET | Freq: Four times a day (QID) | ORAL | Status: DC | PRN
  Filled 2020-02-16: qty 1

## 2020-02-16 MED ORDER — WARFARIN SODIUM 3 MG PO TABS
3.0000 mg | ORAL_TABLET | Freq: Once | ORAL | Status: AC
  Administered 2020-02-16: 3 mg via ORAL
  Filled 2020-02-16: qty 1

## 2020-02-16 MED ORDER — LINEZOLID 600 MG PO TABS
600.0000 mg | ORAL_TABLET | Freq: Two times a day (BID) | ORAL | Status: DC
  Filled 2020-02-16 (×2): qty 1

## 2020-02-16 MED ORDER — LINEZOLID 600 MG PO TABS
600.0000 mg | ORAL_TABLET | Freq: Two times a day (BID) | ORAL | Status: AC
  Administered 2020-02-16 – 2020-02-18 (×5): 600 mg via ORAL
  Filled 2020-02-16 (×5): qty 1

## 2020-02-16 NOTE — Progress Notes (Signed)
PHARMACIST - PHYSICIAN COMMUNICATION DR:  Venetia Constable CONCERNING: Antibiotic IV to Oral Route Change Policy  RECOMMENDATION: This patient is receiving Linezolid by the intravenous route.  Based on criteria approved by the Pharmacy and Therapeutics Committee, the antibiotic(s) is/are being converted to the equivalent oral dose form(s).   DESCRIPTION: These criteria include:  Patient being treated for a respiratory tract infection, urinary tract infection, cellulitis or clostridium difficile associated diarrhea if on metronidazole  The patient is not neutropenic and does not exhibit a GI malabsorption state  The patient is eating (either orally or via tube) and/or has been taking other orally administered medications for a least 24 hours  The patient is improving clinically and has a Tmax < 100.5  If you have questions about this conversion, please contact the Pharmacy Department  []   [phone] )  [facility] [x]   [phone] )  [facility] []   [phone] )  [facility] []   [phone] )  [facility] []   [phone] )  [place], PharmD, BCPS.   Work Cell: [phone] 02/16/2020 2:45 PM

## 2020-02-16 NOTE — Care Management Important Message (Signed)
Important Message  Patient Details  Name: [name] MRN: [id number] Date of Birth: 1925-11-30   Medicare Important Message Given:  Yes     [name] 02/16/2020, 11:38 AM

## 2020-02-16 NOTE — Progress Notes (Signed)
Physical Therapy Treatment Patient Details Name: [name] MRN: [id number] DOB: 06/30/1926 Today's Date: 02/16/2020    History of Present Illness Pt admitted for R LE cellulitis secondary to wound from mechanical injury. History of Afib, dementia, and HTN.    PT Comments    Pt is making good progress towards goals with ability to ambulate around RN station. Initial step to gait pattern progressing to reciprocal gait. Safe technique with improved WBing on R LE, still limited by pain. Safe technique with RW. Still confused and is very hopeful to dc home. Reviewed stair training. Will progress as able.   Follow Up Recommendations  No PT follow up     Equipment Recommendations  None recommended by PT    Recommendations for Other Services       Precautions / Restrictions Precautions Precautions: Fall Restrictions Weight Bearing Restrictions: No    Mobility  Bed Mobility               General bed mobility comments: not performed as she was received in recliner  Transfers Overall transfer level: Modified independent Equipment used: Rolling walker (2 wheeled)             General transfer comment: safe technique.  Ambulation/Gait Ambulation/Gait assistance: Supervision Gait Distance (Feet): 200 Feet Assistive device: Rolling walker (2 wheeled) Gait Pattern/deviations: Step-to pattern Gait velocity: 10' in 6 seconds   General Gait Details: ambulated in hallway with step to gait pattern progressing to reciprocal gait pattern. WIth further distance, increased WBing noted on R LE. No fatigue noted, however does complain of pain with exertion.   Stairs             Wheelchair Mobility    Modified Rankin (Stroke Patients Only)       Balance Overall balance assessment: Mild deficits observed, not formally tested                                          Cognition Arousal/Alertness: Awake/alert Behavior During Therapy: WFL for  tasks assessed/performed Overall Cognitive Status: Impaired/Different from baseline                                 General Comments: Pt remains confused and wanting to go home. Easily redirectable      Exercises Other Exercises Other Exercises: Seated ther-ex performed including hip add squeezes, LAQ, alt. marching, SLRs, and hip abd/add. All ther-ex performed x 12 reps with supervision and safe technique    General Comments        Pertinent Vitals/Pain Pain Assessment: Faces Faces Pain Scale: Hurts even more Pain Location: R LE Pain Descriptors / Indicators: Discomfort;Constant Pain Intervention(s): Limited activity within patient's tolerance;RN gave pain meds during session    Home Living                      Prior Function            PT Goals (current goals can now be found in the care plan section) Acute Rehab PT Goals Patient Stated Goal: to go home PT Goal Formulation: With patient Time For Goal Achievement: 02/26/20 Potential to Achieve Goals: Good Progress towards PT goals: Progressing toward goals    Frequency    Min 2X/week      PT Plan Current plan remains  appropriate    Co-evaluation              AM-PAC PT "6 Clicks" Mobility   Outcome Measure  Help needed turning from your back to your side while in a flat bed without using bedrails?: None Help needed moving from lying on your back to sitting on the side of a flat bed without using bedrails?: None Help needed moving to and from a bed to a chair (including a wheelchair)?: None Help needed standing up from a chair using your arms (e.g., wheelchair or bedside chair)?: None Help needed to walk in hospital room?: None Help needed climbing 3-5 steps with a railing? : None 6 Click Score: 24    End of Session Equipment Utilized During Treatment: Gait belt Activity Tolerance: Patient limited by pain Patient left: in chair;with chair alarm set;with family/visitor  present Nurse Communication: Mobility status PT Visit Diagnosis: Difficulty in walking, not elsewhere classified (R26.2);Pain Pain - Right/Left: Right Pain - part of body: Leg     Time: 1447-1511 PT Time Calculation (min) (ACUTE ONLY): 24 min  Charges:  $Gait Training: 8-22 mins $Therapeutic Exercise: 8-22 mins                     [name], PT, DPT [phone]    [name] 02/16/2020, 4:44 PM

## 2020-02-16 NOTE — Progress Notes (Signed)
[facility] PROGRESS NOTE    Progress Note  [name]  [id number] DOB: 08-16-1926 DOA: 02/11/2020 PCP: Derinda Late, MD     Brief Narrative:   [name] is an 86 y.o. female past medical history significant for chronic atrial fibrillation on Coumadin, dementia essential hypertension severe tricuspid and mitral insufficiency followed by cardiology was sent to the ED by wound care doctor, the patient's is due to wound to the right chin 3 weeks prior to admission which got infected and was manages an outpatient with surgical debridement on 02/04/2020 however despite this it became red painful and swollen, most of the history was obtained by the daughter-in-law as patient has advanced dementia.  Assessment/Plan:   Cellulitis of right leg with fat layer exposed: Patient with a necrotic wound surgery was consulted and family refused surgical debridement. We will continue chemical debridement, continue IV linezolid and cefepime Surgery recommended to continue conservative management, will have wound care evaluate the wound. Erythema appears about the same continue dressing changes per wound care nurse. Physical therapy saw the patient and recommended no home health PT.  Essential hypertension: Continue Norvasc and losartan. Add hydralazine as needed for blood pressure greater than 180.  She is also in pain which could be driving her blood pressure high.  Chronic atrial fibrillation: She is not on any rate controlling agents resume Coumadin per pharmacy.  Tricuspid and mitral insufficiency: Noted.  Advanced dementia without behavioral disturbances: Continue Namenda.   DVT prophylaxis: Warfarin Family Communication: Daughter-in-law Status is: Inpatient  The patient will require care spanning > 2 midnights and should be moved to inpatient because: IV treatments appropriate due to intensity of illness or inability to take PO and Inpatient level of care  appropriate due to severity of illness  Dispo: The patient is from: Home              Anticipated d/c is to: Home              Anticipated d/c date is: 2 days              Patient currently is not medically stable to d/c.    Code Status:     Code Status Orders  (From admission, onward)         Start     Ordered   02/11/20 2207  Full code  Continuous     02/11/20 2208        Code Status History    This patient has a current code status but no historical code status.   Advance Care Planning Activity    Advance Directive Documentation     Most Recent Value  Type of Advance Directive  Healthcare Power of Attorney  Pre-existing out of facility DNR order (yellow form or pink MOST form)  --  "MOST" Form in Place?  --        IV Access:    Peripheral IV   Procedures and diagnostic studies:   No results found.   Medical Consultants:    None.  Anti-Infectives:   rocephin and vancomycin  Subjective:    [name] she really now her back hurts.  Objective:    Vitals:   02/15/20 0828 02/15/20 1126 02/15/20 2313 02/16/20 0832  BP: (!) 163/98 124/69 (!) 159/97 (!) 177/100  Pulse: 90 75 95 90  Resp: 16 17 17 16   Temp: 97.8 F (36.6 C) 98 F (36.7 C) 97.9 F (36.6 C) 97.6 F (36.4 C)  TempSrc: Oral Oral  Oral  SpO2: 92% 96% 93% 94%  Weight:      Height:       SpO2: 94 %   Intake/Output Summary (Last 24 hours) at 02/16/2020 1159 Last data filed at 02/16/2020 0900 Gross per 24 hour  Intake 640 ml  Output --  Net 640 ml   Filed Weights   02/11/20 1711  Weight: 40.8 kg    Exam: General exam: In no acute distress. Respiratory system: Good air movement and clear to auscultation. Cardiovascular system: S1 & S2 heard, RRR. No JVD. Gastrointestinal system: Abdomen is nondistended, soft and nontender.  Extremities: No pedal edema. Skin: Looks erythematous tender to touch and warm no significant improvement compared to yesterday.  Data  Reviewed:    Labs: Basic Metabolic Panel: Recent Labs  Lab 02/11/20 1716 02/11/20 1716 02/12/20 0443 02/12/20 0443 02/13/20 0413 02/16/20 0550  NA 135  --  135  --  136 131*  K 3.4*   < > 3.3*   < > 4.2 3.4*  CL 95*  --  97*  --  100 95*  CO2 24  --  28  --  24 27  GLUCOSE 95  --  107*  --  91 93  BUN 10  --  9  --  11 8  CREATININE 0.57  --  0.49  --  0.60 0.55  CALCIUM 9.5  --  8.5*  --  8.7* 8.4*   < > = values in this interval not displayed.   GFR Estimated Creatinine Clearance: 29.5 mL/min (by C-G formula based on SCr of 0.55 mg/dL). Liver Function Tests: Recent Labs  Lab 02/11/20 1716  AST 20  ALT 12  ALKPHOS 133*  BILITOT 1.0  PROT 8.0  ALBUMIN 4.2   No results for input(s): LIPASE, AMYLASE in the last 168 hours. No results for input(s): AMMONIA in the last 168 hours. Coagulation profile Recent Labs  Lab 02/12/20 0443 02/12/20 1428 02/14/20 1000 02/15/20 0501 02/16/20 0550  INR 3.3* 2.9* 3.3* 3.0* 3.0*   COVID-19 Labs  No results for input(s): DDIMER, FERRITIN, LDH, CRP in the last 72 hours.  Lab Results  Component Value Date   SARSCOV2NAA NEGATIVE 02/11/2020   [place] NEGATIVE 01/19/2020    CBC: Recent Labs  Lab 02/11/20 1716 02/12/20 0443 02/13/20 0413 02/16/20 0550  WBC 6.2 5.0 5.4 4.3  NEUTROABS 3.4  --   --   --   HGB 16.4* 13.9 14.2 14.7  HCT 46.1* 40.6 42.0 43.6  MCV 91.7 94.4 95.2 97.3  PLT 359 310 310 303   Cardiac Enzymes: Recent Labs  Lab 02/13/20 0443  CKTOTAL 24*   BNP (last 3 results) No results for input(s): PROBNP in the last 8760 hours. CBG: No results for input(s): GLUCAP in the last 168 hours. D-Dimer: No results for input(s): DDIMER in the last 72 hours. Hgb A1c: No results for input(s): HGBA1C in the last 72 hours. Lipid Profile: No results for input(s): CHOL, HDL, LDLCALC, TRIG, CHOLHDL, LDLDIRECT in the last 72 hours. Thyroid function studies: No results for input(s): TSH, T4TOTAL, T3FREE,  THYROIDAB in the last 72 hours.  Invalid input(s): FREET3 Anemia work up: No results for input(s): VITAMINB12, FOLATE, FERRITIN, TIBC, IRON, RETICCTPCT in the last 72 hours. Sepsis Labs: Recent Labs  Lab 02/11/20 1716 02/12/20 0443 02/13/20 0413 02/16/20 0550  WBC 6.2 5.0 5.4 4.3  LATICACIDVEN 1.3  --   --   --    Microbiology Recent  Results (from the past 240 hour(s))  Culture, blood (Routine x 2)     Status: None   Collection Time: 02/11/20  5:16 PM   Specimen: BLOOD  Result Value Ref Range Status   Specimen Description BLOOD BLOOD RIGHT FOREARM  Final   Special Requests   Final    BOTTLES DRAWN AEROBIC AND ANAEROBIC Blood Culture adequate volume   Culture   Final    NO GROWTH 5 DAYS Performed at [facility], [address] [place], [place] [address]    Report Status 02/16/2020 FINAL  Final  Culture, blood (Routine x 2)     Status: None   Collection Time: 02/11/20  5:16 PM   Specimen: BLOOD  Result Value Ref Range Status   Specimen Description   Final    BLOOD Blood Culture results may not be optimal due to an inadequate volume of blood received in culture bottles   Special Requests   Final    BOTTLES DRAWN AEROBIC AND ANAEROBIC RIGHT ANTECUBITAL   Culture   Final    NO GROWTH 5 DAYS Performed at [facility], [address], [place], [place] [address]    Report Status 02/16/2020 FINAL  Final  SARS CORONAVIRUS 2 (TAT 6-24 HRS) Nasopharyngeal Nasopharyngeal Swab     Status: None   Collection Time: 02/11/20  9:25 PM   Specimen: Nasopharyngeal Swab  Result Value Ref Range Status   SARS Coronavirus 2 NEGATIVE NEGATIVE Final    Comment: (NOTE) SARS-CoV-2 target nucleic acids are NOT DETECTED. The SARS-CoV-2 RNA is generally detectable in upper and lower respiratory specimens during the acute phase of infection. Negative results do not preclude SARS-CoV-2 infection, do not rule out co-infections with other pathogens, and should not be used as  the sole basis for treatment or other patient management decisions. Negative results must be combined with clinical observations, patient history, and epidemiological information. The expected result is Negative. Fact Sheet for Patients: SugarRoll.be Fact Sheet for Healthcare Providers: https://www.woods-mathews.com/ This test is not yet approved or cleared by the [place] FDA and  has been authorized for detection and/or diagnosis of SARS-CoV-2 by FDA under an Emergency Use Authorization (EUA). This EUA will remain  in effect (meaning this test can be used) for the duration of the COVID-19 declaration under Section 56 4(b)(1) of the Act, 21 U.S.C. section 360bbb-3(b)(1), unless the authorization is terminated or revoked sooner. Performed at [facility], [place] [address] [place], [place] [address]      Medications:   . amLODipine  5 mg Oral QPM  . feeding supplement (ENSURE ENLIVE)  237 mL Oral BID BM  . losartan  50 mg Oral QPM  . memantine  5 mg Oral BID  . silver sulfADIAZINE   Topical BID  . warfarin  3 mg Oral ONCE-1600  . Warfarin - Pharmacist Dosing Inpatient   Does not apply q1600   Continuous Infusions: . sodium chloride 1,000 mL (02/13/20 1620)  . ceFEPime (MAXIPIME) IV 1 g (02/16/20 0911)  . linezolid (ZYVOX) IV 600 mg (02/16/20 1128)      LOS: 4 days   Charlynne Cousins  [facility]  02/16/2020, 11:59 AM

## 2020-02-16 NOTE — TOC Progression Note (Signed)
Transition of Care [facility]) - Progression Note    Patient Details  Name: [name] MRN: [id number] Date of Birth: Dec 11, 1925  Transition of [facility]) CM/SW Contact  Davey Bergsma, Gardiner Rhyme, LCSW Phone Number: 02/16/2020, 2:13 PM  Clinical Narrative:   Met with pt and husband in the room to discuss awaiting MD decision on discharge needs. She would like to go home. Made aware may need IV antibiotics and HHRN. They do have a supplement with their Medicare. Will await MD input.    Expected Discharge Plan: Home/Self Care Barriers to Discharge: Continued Medical Work up  Expected Discharge Plan and Services Expected Discharge Plan: Home/Self Care In-house Referral: Clinical Social Work     Living arrangements for the past 2 months: Single Family Home                                       Social Determinants of Health (SDOH) Interventions    Readmission Risk Interventions No flowsheet data found.

## 2020-02-16 NOTE — Consult Note (Signed)
[place] for Warfarin Indication: atrial fibrillation  No Known Allergies  Patient Measurements: Height: 5' (152.4 cm) Weight: 40.8 kg (90 lb) IBW/kg (Calculated) : 45.5  Vital Signs: Temp: 97.9 F (36.6 C) (04/25 2313) BP: 159/97 (04/25 2313) Pulse Rate: 95 (04/25 2313)  Labs: Recent Labs    02/14/20 1000 02/15/20 0501 02/16/20 0550  HGB  --   --  14.7  HCT  --   --  43.6  PLT  --   --  303  LABPROT 33.2* 31.0* 31.2*  INR 3.3* 3.0* 3.0*    Estimated Creatinine Clearance: 29.5 mL/min (by C-G formula based on SCr of 0.6 mg/dL).   Medical History: Past Medical History:  Diagnosis Date  . Atrial fibrillation ([place])   . 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 Hypertension   . Irregular heart beat   . Memory difficulty   . Meniere's disease     Medications:  PTA warfarin dose 5mg  qd - last taken 4/20 @ 1800   INR Warfarin dose 4/21  3.0   no warfarin 4/22  3.3   3mg  4/22  2.9    4 mg 4/23  - 4 mg 4/24  3.3 2.5 mg 4/25  3.0 2.5 mg 4/26 3.0    Assessment: Pharmacy has been consulted to resume warfarin dosing in 86 yo female with chronic a fib.  Goal of Therapy:  INR 2-3 Monitor platelets by anticoagulation protocol: Yes   Plan:  Will order  Warfarin 3 mg x 1 tonight DDI- pt on CTX,Zyvox.   Will follow INR daily and CBC a minimum of every 3 days per protocol.  Lu Duffel, PharmD, BCPS Clinical Pharmacist 02/16/2020 8:21 AM

## 2020-02-17 DIAGNOSIS — F0390 Unspecified dementia without behavioral disturbance: Secondary | ICD-10-CM | POA: Diagnosis not present

## 2020-02-17 DIAGNOSIS — L03115 Cellulitis of right lower limb: Secondary | ICD-10-CM | POA: Diagnosis not present

## 2020-02-17 DIAGNOSIS — I482 Chronic atrial fibrillation, unspecified: Secondary | ICD-10-CM | POA: Diagnosis not present

## 2020-02-17 LAB — PROTIME-INR
INR: 2.5 — ABNORMAL HIGH (ref 0.8–1.2)
Prothrombin Time: 26.8 seconds — ABNORMAL HIGH (ref 11.4–15.2)

## 2020-02-17 MED ORDER — SODIUM CHLORIDE 0.9 % IV SOLN: INTRAVENOUS | Status: DC

## 2020-02-17 MED ORDER — WARFARIN SODIUM 4 MG PO TABS
4.0000 mg | ORAL_TABLET | Freq: Once | ORAL | Status: AC
  Administered 2020-02-17: 4 mg via ORAL
  Filled 2020-02-17: qty 1

## 2020-02-17 MED ORDER — POTASSIUM CHLORIDE IN NACL 40-0.9 MEQ/L-% IV SOLN
INTRAVENOUS | Status: DC
  Administered 2020-02-17 – 2020-02-18 (×2): 75 mL/h via INTRAVENOUS
  Filled 2020-02-17 (×2): qty 1000

## 2020-02-17 NOTE — Consult Note (Signed)
[place] for Warfarin Indication: atrial fibrillation  No Known Allergies  Patient Measurements: Height: 5' (152.4 cm) Weight: 40.8 kg (90 lb) IBW/kg (Calculated) : 45.5  Vital Signs: Temp: 97.8 F (36.6 C) (04/27 0756) Temp Source: Oral (04/27 0756) BP: 167/96 (04/27 0756) Pulse Rate: 92 (04/27 0756)  Labs: Recent Labs    02/15/20 0501 02/16/20 0550 02/17/20 0639  HGB  --  14.7  --   HCT  --  43.6  --   PLT  --  303  --   LABPROT 31.0* 31.2* 26.8*  INR 3.0* 3.0* 2.5*  CREATININE  --  0.55  --     Estimated Creatinine Clearance: 28.9 mL/min (by C-G formula based on SCr of 0.55 mg/dL).   Medical History: Past Medical History:  Diagnosis Date  . Atrial fibrillation ([place])   . 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 Hypertension   . Irregular heart beat   . Memory difficulty   . Meniere's disease     Medications:  PTA warfarin dose 5mg  qd - last taken 4/20 @ 1800   INR Warfarin dose 4/21  3.0   no warfarin 4/22  3.3   3 mg 4/22  2.9    4 mg 4/23  - 4 mg 4/24  3.3 2.5 mg 4/25  3.0 2.5 mg 4/26 3.0 3 mg  4/27   2.5   Assessment: Pharmacy has been consulted to resume warfarin dosing in 86 yo female with chronic a fib.  Goal of Therapy:  INR 2-3 Monitor platelets by anticoagulation protocol: Yes   Plan:  Will order  Warfarin 4 mg x 1 tonight DDI- pt on CTX,Zyvox.   Will follow INR daily and CBC a minimum of every 3 days per protocol.  Lu Duffel, PharmD, BCPS Clinical Pharmacist 02/17/2020 8:36 AM

## 2020-02-17 NOTE — Progress Notes (Addendum)
[facility] PROGRESS NOTE    Progress Note  [name]  [id number] DOB: [birth year] DOA: 02/11/2020 PCP: Derinda Late, MD     Brief Narrative:   [name] is an 86 y.o. female past medical history significant for chronic atrial fibrillation on Coumadin, dementia essential hypertension severe tricuspid and mitral insufficiency followed by cardiology was sent to the ED by wound care doctor, the patient's is due to wound to the right chin 3 weeks prior to admission which got infected and was manages an outpatient with surgical debridement on 02/04/2020 however despite this it became red painful and swollen, most of the history was obtained by the daughter-in-law as patient has advanced dementia.  Assessment/Plan:   Cellulitis of right leg with fat layer exposed: Patient with a necrotic wound surgery was consulted and family refused surgical debridement. We will continue chemical debridement, continue IV linezolid and cefepime Surgery recommended to continue conservative management, will have wound care evaluate the wound. Erythema appears about the same, continue daily dressing changes per surgery recommendations, physical therapy rec home health PT. We will give her 1-2 additional day of IV antibiotics and can go home on antibiotics for an additional 7 days (for a total of 2 weeks of antibiotics) and follow-up with wound care and surgery as an outpatient.  Essential hypertension: Continue Norvasc and losartan. Add hydralazine as needed for blood pressure greater than 180.  She is also in pain which could be driving her blood pressure high.  New mild hyponatremia/hyperkalemia: Likely due to hypovolemia we will start her on IV fluids with potassium supplementation recheck basic metabolic panel in the morning.  Chronic atrial fibrillation: She is not on any rate controlling agents resume Coumadin per pharmacy.  Tricuspid and mitral insufficiency: Noted.  Advanced  dementia without behavioral disturbances: Continue Namenda.   DVT prophylaxis: Warfarin Family Communication: Daughter-in-law Status is: Inpatient  The patient will require care spanning > 2 midnights and should be moved to inpatient because: IV treatments appropriate due to intensity of illness or inability to take PO and Inpatient level of care appropriate due to severity of illness  Dispo: The patient is from: Home              Anticipated d/c is to: Home              Anticipated d/c date is: 1 days              Patient currently is not medically stable to d/c.    Code Status:     Code Status Orders  (From admission, onward)         Start     Ordered   02/11/20 2207  Full code  Continuous     02/11/20 2208        Code Status History    This patient has a current code status but no historical code status.   Advance Care Planning Activity    Advance Directive Documentation     Most Recent Value  Type of Advance Directive  Healthcare Power of Attorney  Pre-existing out of facility DNR order (yellow form or pink MOST form)  --  "MOST" Form in Place?  --        IV Access:    Peripheral IV   Procedures and diagnostic studies:   No results found.   Medical Consultants:    None.  Anti-Infectives:   rocephin and vancomycin  Subjective:    [name] she continues  to complain of pain with manipulation of that wound.  Objective:    Vitals:   02/15/20 2313 02/16/20 0832 02/17/20 0002 02/17/20 0756  BP: (!) 159/97 (!) 177/100 (!) 167/98 (!) 167/96  Pulse: 95 90 94 92  Resp: 17 16 16    Temp: 97.9 F (36.6 C) 97.6 F (36.4 C) 97.8 F (36.6 C) 97.8 F (36.6 C)  TempSrc:  Oral Oral Oral  SpO2: 93% 94% 99% 95%  Weight:      Height:       SpO2: 95 %   Intake/Output Summary (Last 24 hours) at 02/17/2020 1230 Last data filed at 02/17/2020 0945 Gross per 24 hour  Intake 120 ml  Output --  Net 120 ml   Filed Weights   02/11/20 1711   Weight: 40.8 kg    Exam: General exam: In no acute distress. Respiratory system: Good air movement and clear to auscultation. Cardiovascular system: S1 & S2 heard, RRR. No JVD. Gastrointestinal system: Abdomen is nondistended, soft and nontender.  Extremities: No pedal edema. Skin: Erythematous with necrotic tissue and granulation tissue, not warm to touch but significantly tender.  Unchanged from yesterday.  Data Reviewed:    Labs: Basic Metabolic Panel: Recent Labs  Lab 02/11/20 1716 02/11/20 1716 02/12/20 0443 02/12/20 0443 02/13/20 0413 02/16/20 0550  NA 135  --  135  --  136 131*  K 3.4*   < > 3.3*   < > 4.2 3.4*  CL 95*  --  97*  --  100 95*  CO2 24  --  28  --  24 27  GLUCOSE 95  --  107*  --  91 93  BUN 10  --  9  --  11 8  CREATININE 0.57  --  0.49  --  0.60 0.55  CALCIUM 9.5  --  8.5*  --  8.7* 8.4*   < > = values in this interval not displayed.   GFR Estimated Creatinine Clearance: 28.9 mL/min (by C-G formula based on SCr of 0.55 mg/dL). Liver Function Tests: Recent Labs  Lab 02/11/20 1716  AST 20  ALT 12  ALKPHOS 133*  BILITOT 1.0  PROT 8.0  ALBUMIN 4.2   No results for input(s): LIPASE, AMYLASE in the last 168 hours. No results for input(s): AMMONIA in the last 168 hours. Coagulation profile Recent Labs  Lab 02/12/20 1428 02/14/20 1000 02/15/20 0501 02/16/20 0550 02/17/20 0639  INR 2.9* 3.3* 3.0* 3.0* 2.5*   COVID-19 Labs  No results for input(s): DDIMER, FERRITIN, LDH, CRP in the last 72 hours.  Lab Results  Component Value Date   SARSCOV2NAA NEGATIVE 02/11/2020   [place] NEGATIVE 01/19/2020    CBC: Recent Labs  Lab 02/11/20 1716 02/12/20 0443 02/13/20 0413 02/16/20 0550  WBC 6.2 5.0 5.4 4.3  NEUTROABS 3.4  --   --   --   HGB 16.4* 13.9 14.2 14.7  HCT 46.1* 40.6 42.0 43.6  MCV 91.7 94.4 95.2 97.3  PLT 359 310 310 303   Cardiac Enzymes: Recent Labs  Lab 02/13/20 0443  CKTOTAL 24*   BNP (last 3 results) No  results for input(s): PROBNP in the last 8760 hours. CBG: No results for input(s): GLUCAP in the last 168 hours. D-Dimer: No results for input(s): DDIMER in the last 72 hours. Hgb A1c: No results for input(s): HGBA1C in the last 72 hours. Lipid Profile: No results for input(s): CHOL, HDL, LDLCALC, TRIG, CHOLHDL, LDLDIRECT in the last 72 hours. Thyroid function studies:  No results for input(s): TSH, T4TOTAL, T3FREE, THYROIDAB in the last 72 hours.  Invalid input(s): FREET3 Anemia work up: No results for input(s): VITAMINB12, FOLATE, FERRITIN, TIBC, IRON, RETICCTPCT in the last 72 hours. Sepsis Labs: Recent Labs  Lab 02/11/20 1716 02/12/20 0443 02/13/20 0413 02/16/20 0550  WBC 6.2 5.0 5.4 4.3  LATICACIDVEN 1.3  --   --   --    Microbiology Recent Results (from the past 240 hour(s))  Culture, blood (Routine x 2)     Status: None   Collection Time: 02/11/20  5:16 PM   Specimen: BLOOD  Result Value Ref Range Status   Specimen Description BLOOD BLOOD RIGHT FOREARM  Final   Special Requests   Final    BOTTLES DRAWN AEROBIC AND ANAEROBIC Blood Culture adequate volume   Culture   Final    NO GROWTH 5 DAYS Performed at [facility], [address] [place], [place] [address]    Report Status 02/16/2020 FINAL  Final  Culture, blood (Routine x 2)     Status: None   Collection Time: 02/11/20  5:16 PM   Specimen: BLOOD  Result Value Ref Range Status   Specimen Description   Final    BLOOD Blood Culture results may not be optimal due to an inadequate volume of blood received in culture bottles   Special Requests   Final    BOTTLES DRAWN AEROBIC AND ANAEROBIC RIGHT ANTECUBITAL   Culture   Final    NO GROWTH 5 DAYS Performed at [facility], [address] [place], [place] [address]    Report Status 02/16/2020 FINAL  Final  SARS CORONAVIRUS 2 (TAT 6-24 HRS) Nasopharyngeal Nasopharyngeal Swab     Status: None   Collection Time: 02/11/20  9:25 PM   Specimen:  Nasopharyngeal Swab  Result Value Ref Range Status   SARS Coronavirus 2 NEGATIVE NEGATIVE Final    Comment: (NOTE) SARS-CoV-2 target nucleic acids are NOT DETECTED. The SARS-CoV-2 RNA is generally detectable in upper and lower respiratory specimens during the acute phase of infection. Negative results do not preclude SARS-CoV-2 infection, do not rule out co-infections with other pathogens, and should not be used as the sole basis for treatment or other patient management decisions. Negative results must be combined with clinical observations, patient history, and epidemiological information. The expected result is Negative. Fact Sheet for Patients: SugarRoll.be Fact Sheet for Healthcare Providers: https://www.woods-mathews.com/ This test is not yet approved or cleared by the [place] FDA and  has been authorized for detection and/or diagnosis of SARS-CoV-2 by FDA under an Emergency Use Authorization (EUA). This EUA will remain  in effect (meaning this test can be used) for the duration of the COVID-19 declaration under Section 56 4(b)(1) of the Act, 21 U.S.C. section 360bbb-3(b)(1), unless the authorization is terminated or revoked sooner. Performed at [facility], [place] [address], [place], [place] [address]      Medications:   . amLODipine  5 mg Oral QPM  . feeding supplement (ENSURE ENLIVE)  237 mL Oral BID BM  . linezolid  600 mg Oral Q12H  . losartan  50 mg Oral QPM  . silver sulfADIAZINE   Topical BID  . warfarin  4 mg Oral ONCE-1600  . Warfarin - Pharmacist Dosing Inpatient   Does not apply q1600   Continuous Infusions: . sodium chloride 1,000 mL (02/13/20 1620)  . ceFEPime (MAXIPIME) IV 1 g (02/17/20 0803)      LOS: 5 days  Charlynne Cousins  [facility]  02/17/2020, 12:30 PM

## 2020-02-17 NOTE — TOC Progression Note (Signed)
Transition of Care [facility] - Progression Note    Patient Details  Name: COLIE BAKEMAN MRN: [id number] Date of Birth: 1926/04/27  Transition of Care [facility]) CM/SW Contact  Kodee Ravert, Gardiner Rhyme, LCSW Phone Number: 02/17/2020, 11:37 AM  Clinical Narrative:   According to MD not ready to Dc today wants to watch her wound. Have made referral for [facility] via [facility] to follow. Pt has no equipment needs or other therapies needs. Will await medical stability for DC home.    Expected Discharge Plan: Home/Self Care Barriers to Discharge: Continued Medical Work up  Expected Discharge Plan and Services Expected Discharge Plan: Home/Self Care In-house Referral: Clinical Social Work     Living arrangements for the past 2 months: Single Family Home                                       Social Determinants of Health (SDOH) Interventions    Readmission Risk Interventions No flowsheet data found.

## 2020-02-18 ENCOUNTER — Ambulatory Visit: Payer: Medicare Other | Admitting: Internal Medicine

## 2020-02-18 DIAGNOSIS — I10 Essential (primary) hypertension: Secondary | ICD-10-CM | POA: Diagnosis not present

## 2020-02-18 DIAGNOSIS — I272 Pulmonary hypertension, unspecified: Secondary | ICD-10-CM

## 2020-02-18 DIAGNOSIS — X58XXXA Exposure to other specified factors, initial encounter: Secondary | ICD-10-CM | POA: Diagnosis not present

## 2020-02-18 DIAGNOSIS — Z7901 Long term (current) use of anticoagulants: Secondary | ICD-10-CM

## 2020-02-18 DIAGNOSIS — E876 Hypokalemia: Secondary | ICD-10-CM

## 2020-02-18 DIAGNOSIS — I4891 Unspecified atrial fibrillation: Secondary | ICD-10-CM | POA: Diagnosis not present

## 2020-02-18 DIAGNOSIS — S8992XA Unspecified injury of left lower leg, initial encounter: Secondary | ICD-10-CM

## 2020-02-18 DIAGNOSIS — S81811A Laceration without foreign body, right lower leg, initial encounter: Secondary | ICD-10-CM | POA: Diagnosis not present

## 2020-02-18 DIAGNOSIS — M4854XD Collapsed vertebra, not elsewhere classified, thoracic region, subsequent encounter for fracture with routine healing: Secondary | ICD-10-CM

## 2020-02-18 DIAGNOSIS — I340 Nonrheumatic mitral (valve) insufficiency: Secondary | ICD-10-CM

## 2020-02-18 DIAGNOSIS — I482 Chronic atrial fibrillation, unspecified: Secondary | ICD-10-CM

## 2020-02-18 DIAGNOSIS — E871 Hypo-osmolality and hyponatremia: Secondary | ICD-10-CM | POA: Diagnosis not present

## 2020-02-18 DIAGNOSIS — Z79899 Other long term (current) drug therapy: Secondary | ICD-10-CM

## 2020-02-18 DIAGNOSIS — Z9889 Other specified postprocedural states: Secondary | ICD-10-CM

## 2020-02-18 DIAGNOSIS — Z87891 Personal history of nicotine dependence: Secondary | ICD-10-CM

## 2020-02-18 DIAGNOSIS — L03115 Cellulitis of right lower limb: Secondary | ICD-10-CM | POA: Diagnosis not present

## 2020-02-18 LAB — PROTIME-INR
INR: 2.4 — ABNORMAL HIGH (ref 0.8–1.2)
Prothrombin Time: 25 seconds — ABNORMAL HIGH (ref 11.4–15.2)

## 2020-02-18 LAB — BASIC METABOLIC PANEL
Anion gap: 12 (ref 5–15)
BUN: 7 mg/dL — ABNORMAL LOW (ref 8–23)
CO2: 26 mmol/L (ref 22–32)
Calcium: 8.4 mg/dL — ABNORMAL LOW (ref 8.9–10.3)
Chloride: 93 mmol/L — ABNORMAL LOW (ref 98–111)
Creatinine, Ser: 0.47 mg/dL (ref 0.44–1.00)
GFR calc Af Amer: >60 mL/min (ref >60)
GFR calc non Af Amer: >60 mL/min (ref >60)
Glucose, Bld: 92 mg/dL (ref 70–99)
Potassium: 3.4 mmol/L — ABNORMAL LOW (ref 3.5–5.1)
Sodium: 131 mmol/L — ABNORMAL LOW (ref 135–145)

## 2020-02-18 LAB — CBC
HCT: 43.7 % (ref 36.0–46.0)
Hemoglobin: 15.7 g/dL — ABNORMAL HIGH (ref 12.0–15.0)
MCH: 33.1 pg (ref 26.0–34.0)
MCHC: 35.9 g/dL (ref 30.0–36.0)
MCV: 92.2 fL (ref 80.0–100.0)
Platelets: 323 10*3/uL (ref 150–400)
RBC: 4.74 MIL/uL (ref 3.87–5.11)
RDW: 12.3 % (ref 11.5–15.5)
WBC: 6.5 10*3/uL (ref 4.0–10.5)
nRBC: 0 % (ref 0.0–0.2)

## 2020-02-18 LAB — SEDIMENTATION RATE: Sed Rate: 10 mm/hr (ref 0–30)

## 2020-02-18 MED ORDER — SODIUM CHLORIDE 0.9 % IV SOLN
2.0000 g | INTRAVENOUS | Status: DC
  Administered 2020-02-18: 10:00:00 2 g via INTRAVENOUS
  Filled 2020-02-18 (×2): qty 2

## 2020-02-18 MED ORDER — WARFARIN SODIUM 4 MG PO TABS
4.0000 mg | ORAL_TABLET | Freq: Once | ORAL | Status: AC
  Administered 2020-02-18: 4 mg via ORAL
  Filled 2020-02-18: qty 1

## 2020-02-18 NOTE — Progress Notes (Signed)
D: Pt alert and oriented x 2 (self and place). Pt reports experiencing any pain in leg, prn meds given.  A: Scheduled medications administered to pt, per MD orders. Support and encouragement provided. Frequent verbal contact made.    R: No adverse drug reactions noted. Pt complaint with medications and treatment plan. Pt interacts well with staff on the unit. Pt is stable at this time, Will continue to monitor and provide care for as ordered.

## 2020-02-18 NOTE — Progress Notes (Signed)
PHARMACY NOTE:  ANTIMICROBIAL RENAL DOSAGE ADJUSTMENT  Current antimicrobial regimen includes a mismatch between antimicrobial dosage and estimated renal function.  As per policy approved by the Pharmacy & Therapeutics and Medical Executive Committees, the antimicrobial dosage will be adjusted accordingly.  Current antimicrobial dosage:  Cefepime 1g q12h  Indication: wound infection  Renal Function:  Estimated Creatinine Clearance: 28.9 mL/min (by C-G formula based on SCr of 0.47 mg/dL). []      On intermittent HD, scheduled: []      On CRRT    Antimicrobial dosage has been changed to:  Cefepime 2g q24h  Additional comments:   Thank you for allowing pharmacy to be a part of this patient's care.  Lu Duffel, PharmD, BCPS Clinical Pharmacist 02/18/2020 8:30 AM

## 2020-02-18 NOTE — Consult Note (Signed)
[place] for Warfarin Indication: atrial fibrillation  No Known Allergies  Patient Measurements: Height: 5' (152.4 cm) Weight: 40.8 kg (90 lb) IBW/kg (Calculated) : 45.5  Vital Signs: Temp: 97.3 F (36.3 C) (04/27 2320) Temp Source: Oral (04/27 2320) BP: 159/92 (04/27 2320) Pulse Rate: 92 (04/27 2320)  Labs: Recent Labs    02/16/20 0550 02/17/20 0639 02/18/20 0528  HGB 14.7  --  15.7*  HCT 43.6  --  43.7  PLT 303  --  323  LABPROT 31.2* 26.8* 25.0*  INR 3.0* 2.5* 2.4*  CREATININE 0.55  --  0.47    Estimated Creatinine Clearance: 28.9 mL/min (by C-G formula based on SCr of 0.47 mg/dL).   Medical History: Past Medical History:  Diagnosis Date  . Atrial fibrillation ([place])   . 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 Hypertension   . Irregular heart beat   . Memory difficulty   . Meniere's disease     Medications:  PTA warfarin dose 5mg  qd - last taken 4/20 @ 1800   INR Warfarin dose 4/21  3.0   no warfarin 4/22  3.3   3 mg 4/22  2.9    4 mg 4/23  - 4 mg 4/24  3.3 2.5 mg 4/25  3.0 2.5 mg 4/26 3.0 3 mg  4/27   2.5 4 mg  4/28     2.4  Assessment: Pharmacy has been consulted to resume warfarin dosing in 86 yo female with chronic a fib.  Goal of Therapy:  INR 2-3 Monitor platelets by anticoagulation protocol: Yes   Plan:  Will order  Warfarin 4 mg x 1 tonight DDI- pt on Cefepime,Zyvox.   Will follow INR daily and CBC a minimum of every 3 days per protocol.  Lu Duffel, PharmD, BCPS Clinical Pharmacist 02/18/2020 7:11 AM

## 2020-02-18 NOTE — Progress Notes (Signed)
Patient ID: [name], female   DOB: May 09, 1926, 86 y.o.   MRN: [id number] [facility] PROGRESS NOTE  MARTIANA KINDER [id number] DOB: May 26, 1926 DOA: 02/11/2020 PCP: Derinda Late, MD  HPI/Subjective: Patient not feeling too good.  Does have a sore throat and some nausea and sometimes has some pain in her abdomen.  Sometimes has pain in her right lower extremity.  Family states that she had a kyphoplasty and then developed right lower extremity lesion in the OR.  Objective: Vitals:   02/17/20 2320 02/18/20 0727  BP: (!) 159/92 (!) 167/100  Pulse: 92 92  Resp: 16   Temp: (!) 97.3 F (36.3 C) 98 F (36.7 C)  SpO2: 93% 95%    Intake/Output Summary (Last 24 hours) at 02/18/2020 1346 Last data filed at 02/17/2020 1600 Gross per 24 hour  Intake 796.49 ml  Output --  Net 796.49 ml   Filed Weights   02/11/20 1711  Weight: 40.8 kg    ROS: Review of Systems  Constitutional: Negative for chills and fever.  HENT: Positive for sore throat.   Eyes: Negative for blurred vision.  Respiratory: Negative for cough and shortness of breath.   Cardiovascular: Negative for chest pain.  Gastrointestinal: Positive for abdominal pain and nausea. Negative for constipation, diarrhea and vomiting.  Genitourinary: Negative for dysuria.  Musculoskeletal: Positive for joint pain.  Neurological: Negative for dizziness and headaches.   Exam: Physical Exam  Constitutional: She is oriented to person, place, and time.  HENT:  Nose: No mucosal edema.  Eyes: Conjunctivae and lids are normal.  Neck: Carotid bruit is not present.  Cardiovascular: S1 normal and S2 normal. Exam reveals no gallop.  No murmur heard. Respiratory: No respiratory distress. She has no wheezes. She has no rhonchi. She has no rales.  GI: Soft. Bowel sounds are normal. There is no abdominal tenderness.  Musculoskeletal:     Right ankle: No swelling.     Left ankle: No swelling.  Lymphadenopathy:    She has no  cervical adenopathy.  Neurological: She is alert and oriented to person, place, and time. No cranial nerve deficit.  Skin: Skin is warm. Nails show no clubbing.  Right shin lesion with greenish discoloration coming out.  As per family the redness surrounding it has lessened.  Psychiatric: She has a normal mood and affect.      Data Reviewed: Basic Metabolic Panel: Recent Labs  Lab 02/11/20 1716 02/12/20 0443 02/13/20 0413 02/16/20 0550 02/18/20 0528  NA 135 135 136 131* 131*  K 3.4* 3.3* 4.2 3.4* 3.4*  CL 95* 97* 100 95* 93*  CO2 24 28 24 27 26   GLUCOSE 95 107* 91 93 92  BUN 10 9 11 8  7*  CREATININE 0.57 0.49 0.60 0.55 0.47  CALCIUM 9.5 8.5* 8.7* 8.4* 8.4*   Liver Function Tests: Recent Labs  Lab 02/11/20 1716  AST 20  ALT 12  ALKPHOS 133*  BILITOT 1.0  PROT 8.0  ALBUMIN 4.2   CBC: Recent Labs  Lab 02/11/20 1716 02/12/20 0443 02/13/20 0413 02/16/20 0550 02/18/20 0528  WBC 6.2 5.0 5.4 4.3 6.5  NEUTROABS 3.4  --   --   --   --   HGB 16.4* 13.9 14.2 14.7 15.7*  HCT 46.1* 40.6 42.0 43.6 43.7  MCV 91.7 94.4 95.2 97.3 92.2  PLT 359 310 310 303 323   Cardiac Enzymes: Recent Labs  Lab 02/13/20 0443  CKTOTAL 24*     Recent Results (from the past  240 hour(s))  Culture, blood (Routine x 2)     Status: None   Collection Time: 02/11/20  5:16 PM   Specimen: BLOOD  Result Value Ref Range Status   Specimen Description BLOOD BLOOD RIGHT FOREARM  Final   Special Requests   Final    BOTTLES DRAWN AEROBIC AND ANAEROBIC Blood Culture adequate volume   Culture   Final    NO GROWTH 5 DAYS Performed at [facility] [place], [place] [address]    Report Status 02/16/2020 FINAL  Final  Culture, blood (Routine x 2)     Status: None   Collection Time: 02/11/20  5:16 PM   Specimen: BLOOD  Result Value Ref Range Status   Specimen Description   Final    BLOOD Blood Culture results may not be optimal due to an inadequate volume of blood  received in culture bottles   Special Requests   Final    BOTTLES DRAWN AEROBIC AND ANAEROBIC RIGHT ANTECUBITAL   Culture   Final    NO GROWTH 5 DAYS Performed at [facility], [address], [place], [place] [address]    Report Status 02/16/2020 FINAL  Final  SARS CORONAVIRUS 2 (TAT 6-24 HRS) Nasopharyngeal Nasopharyngeal Swab     Status: None   Collection Time: 02/11/20  9:25 PM   Specimen: Nasopharyngeal Swab  Result Value Ref Range Status   SARS Coronavirus 2 NEGATIVE NEGATIVE Final    Comment: (NOTE) SARS-CoV-2 target nucleic acids are NOT DETECTED. The SARS-CoV-2 RNA is generally detectable in upper and lower respiratory specimens during the acute phase of infection. Negative results do not preclude SARS-CoV-2 infection, do not rule out co-infections with other pathogens, and should not be used as the sole basis for treatment or other patient management decisions. Negative results must be combined with clinical observations, patient history, and epidemiological information. The expected result is Negative. Fact Sheet for Patients: SugarRoll.be Fact Sheet for Healthcare Providers: https://www.woods-mathews.com/ This test is not yet approved or cleared by the [place] FDA and  has been authorized for detection and/or diagnosis of SARS-CoV-2 by FDA under an Emergency Use Authorization (EUA). This EUA will remain  in effect (meaning this test can be used) for the duration of the COVID-19 declaration under Section 56 4(b)(1) of the Act, 21 U.S.C. section 360bbb-3(b)(1), unless the authorization is terminated or revoked sooner. Performed at [facility], [place] [address], [place], [place] [address]      Scheduled Meds: . amLODipine  5 mg Oral QPM  . feeding supplement (ENSURE ENLIVE)  237 mL Oral BID BM  . linezolid  600 mg Oral Q12H  . losartan  50 mg Oral QPM  . silver sulfADIAZINE   Topical BID  . warfarin  4  mg Oral ONCE-1600  . Warfarin - Pharmacist Dosing Inpatient   Does not apply q1600   Continuous Infusions: . sodium chloride 1,000 mL (02/13/20 1620)  . ceFEPime (MAXIPIME) IV 2 g (02/18/20 1001)    Assessment/Plan:  1. Right leg cellulitis and drainage with greenish material.  Since patient has been here a week and has not improved yet, I will get infectious disease consultation.  Unfortunately wound culture not sent off on admission.  Will send a wound culture.  Continue current antibiotics of Maxipime and Zyvox at this point until infectious disease doctor evaluates.  May end up needing wound care center upon disposition.  Seen by general surgery on presentation. 2. Essential hypertension on  Norvasc and losartan 3. Hyponatremia and hypokalemia. 4. Chronic atrial fibrillation.  Patient therapeutic on Coumadin 5. Memory difficulty  Code Status:     Code Status Orders  (From admission, onward)         Start     Ordered   02/11/20 2207  Full code  Continuous     02/11/20 2208        Code Status History    This patient has a current code status but no historical code status.   Advance Care Planning Activity    Advance Directive Documentation     Most Recent Value  Type of Advance Directive  Healthcare Power of Attorney  Pre-existing out of facility DNR order (yellow form or pink MOST form)  --  "MOST" Form in Place?  --     Family Communication: Husband at the bedside Disposition Plan: Patient has been in the hospital on IV antibiotics aggressive since admission.  Patient still has greenish discoloration drainage from her wound.  I will get infectious disease consultation.  Send off a wound culture.  Likely will need a few more days of IV antibiotics.  Likely will have to follow-up as outpatient with wound care center until this is healed.  I need to set up a good treatment plan upon disposition with good follow-up.  Consultants:  General surgery  Infectious  disease  Antibiotics:  Zyvox  Maxipime  Time spent: 28 minutes  [place]

## 2020-02-18 NOTE — Consult Note (Signed)
NAME: [name]  DOB: [birth year]  MRN: [id number]  Date/Time: 02/18/2020 5:58 PM  REQUESTING PROVIDER: wieting Subjective:  REASON FOR CONSULT: Rt leg wound ? MATHILDE [name] is a 86 y.o. with a history of HTN, AFIB,MR< chroic pulmonary HTN, kyphoplasty T6/T9 on 01/20/20  Sh developed a skin tear on her rt leg that day when being moved. Her husband was doing medihoney and then she was taken to the wound clinic on 4/7 Last seen by wound clinic physician on 4/22 who asked her to go to the ED for admission for IV antibiotics for a few days . She has no fever She had a left shin wound after an injury which persisted for months before it got better.  She has  no fever The wound hurts No culture was sent on admission before antibiotics were started  Past Medical History:  Diagnosis Date  . Atrial fibrillation ([place])   . 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 Hypertension   . Irregular heart beat   . Memory difficulty   . Meniere's disease     Past Surgical History:  Procedure Laterality Date  . BACK SURGERY    . IR GENERIC HISTORICAL  05/03/2016   IR RADIOLOGIST EVAL & MGMT 05/03/2016 Corrie Mckusick, DO [facility] INTERV RAD  . IR GENERIC HISTORICAL  02/29/2016   IR RADIOLOGIST EVAL & MGMT 02/29/2016 Marybelle Killings, MD [facility] INTERV RAD  . KYPHOPLASTY N/A 01/20/2020   Procedure: T9 AND T6 KYPHOPLASTY;  Surgeon: Hessie Knows, MD;  Location: [facility] ORS;  Service: Orthopedics;  Laterality: N/A;  . MASTOIDECTOMY    . menieres surg    . Squamous cell excised  2010   Ear  . TONSILLECTOMY      Social History   Socioeconomic History  . Marital status: Married    Spouse name: Not on file  . Number of children: Not on file  . Years of education: Not on file  . Highest education level: Not on file  Occupational History  . Not on file  Tobacco Use  . Smoking status: Former Research scientist (life sciences)  . Smokeless tobacco: Never Used  Substance and Sexual Activity  . Alcohol use: Yes    Alcohol/week: 7.0  standard drinks    Types: 7 Standard drinks or equivalent per week  . Drug use: Never  . Sexual activity: Yes    Birth control/protection: Post-menopausal  Other Topics Concern  . Not on file  Social History Narrative  . Not on file   Social Determinants of Health   Financial Resource Strain:   . Difficulty of Paying Living Expenses:   Food Insecurity:   . Worried About Charity fundraiser in the Last Year:   . Arboriculturist in the Last Year:   Transportation Needs:   . Film/video editor (Medical):   Marland Kitchen Lack of Transportation (Non-Medical):   Physical Activity:   . Days of Exercise per Week:   . Minutes of Exercise per Session:   Stress:   . Feeling of Stress :   Social Connections:   . Frequency of Communication with Friends and Family:   . Frequency of Social Gatherings with Friends and Family:   . Attends Religious Services:   . Active Member of Clubs or Organizations:   . Attends Archivist Meetings:   Marland Kitchen Marital Status:   Intimate Partner Violence:   . Fear of Current or Ex-Partner:   . Emotionally Abused:   .  Physically Abused:   . Sexually Abused:     Family History  Problem Relation Age of Onset  . Hypertension Mother   . Hypertension Father   . Heart disease Father   Ulcerative colitis mother crohns disease sons  No Known Allergies  ? Current Facility-Administered Medications  Medication Dose Route Frequency Provider Last Rate Last Admin  . 0.9 %  sodium chloride infusion   Intravenous PRN Charlynne Cousins, MD 10 mL/hr at 02/13/20 1620 1,000 mL at 02/13/20 1620  . acetaminophen (TYLENOL) tablet 650 mg  650 mg Oral Q6H PRN Athena Masse, MD   650 mg at 02/18/20 1448   Or  . acetaminophen (TYLENOL) suppository 650 mg  650 mg Rectal Q6H PRN Athena Masse, MD      . amLODipine (NORVASC) tablet 5 mg  5 mg Oral QPM Carrie Mew, MD   5 mg at 02/18/20 1701  . ceFEPIme (MAXIPIME) 2 g in sodium chloride 0.9 % 100 mL IVPB  2 g  Intravenous Q24H Lu Duffel, RPH 200 mL/hr at 02/18/20 1001 2 g at 02/18/20 1001  . feeding supplement (ENSURE ENLIVE) (ENSURE ENLIVE) liquid 237 mL  237 mL Oral BID BM Charlynne Cousins, MD   237 mL at 02/18/20 1448  . hydrALAZINE (APRESOLINE) tablet 10 mg  10 mg Oral Q6H PRN Charlynne Cousins, MD      . HYDROcodone-acetaminophen (NORCO/VICODIN) 5-325 MG per tablet 1-2 tablet  1-2 tablet Oral Q4H PRN Athena Masse, MD   1 tablet at 02/18/20 0752  . linezolid (ZYVOX) tablet 600 mg  600 mg Oral Q12H Berton Mount, RPH   600 mg at 02/18/20 0943  . losartan (COZAAR) tablet 50 mg  50 mg Oral QPM Carrie Mew, MD   50 mg at 02/18/20 1701  . ondansetron (ZOFRAN) injection 8 mg  8 mg Intravenous Q6H PRN Charlynne Cousins, MD   8 mg at 02/18/20 0942   Or  . ondansetron (ZOFRAN) tablet 4 mg  4 mg Oral Q6H PRN Charlynne Cousins, MD   4 mg at 02/17/20 2109  . silver sulfADIAZINE (SILVADENE) 1 % cream   Topical BID Tylene Fantasia, [place]   Given at 02/18/20 1448  . Warfarin - Pharmacist Dosing Inpatient   Does not apply q1600 Lu Duffel, [facility]   Given at 02/13/20 1722     Abtx:  Anti-infectives (From admission, onward)   Start     Dose/Rate Route Frequency Ordered Stop   02/18/20 1000  ceFEPIme (MAXIPIME) 2 g in sodium chloride 0.9 % 100 mL IVPB     2 g 200 mL/hr over 30 Minutes Intravenous Every 24 hours 02/18/20 0829     02/16/20 2200  linezolid (ZYVOX) tablet 600 mg     600 mg Oral Every 12 hours 02/16/20 1447     02/16/20 1500  linezolid (ZYVOX) tablet 600 mg  Status:  Discontinued     600 mg Oral Every 12 hours 02/16/20 1446 02/16/20 1447   02/15/20 1200  ceFEPIme (MAXIPIME) 1 g in sodium chloride 0.9 % 100 mL IVPB  Status:  Discontinued     1 g 200 mL/hr over 30 Minutes Intravenous Every 12 hours 02/15/20 1002 02/18/20 0829   02/14/20 1400  vancomycin (VANCOREADY) IVPB 750 mg/150 mL  Status:  Discontinued     750 mg 150 mL/hr over 60 Minutes Intravenous  Every 48 hours 02/12/20 1354 02/13/20 0827   02/13/20 2200  linezolid (ZYVOX)  IVPB 600 mg  Status:  Discontinued     600 mg 300 mL/hr over 60 Minutes Intravenous Every 12 hours 02/13/20 0826 02/16/20 1446   02/12/20 1800  cefTRIAXone (ROCEPHIN) 1 g in sodium chloride 0.9 % 100 mL IVPB  Status:  Discontinued     1 g 200 mL/hr over 30 Minutes Intravenous Every 24 hours 02/11/20 2208 02/15/20 1002   02/12/20 1315  vancomycin (VANCOCIN) IVPB 1000 mg/200 mL premix     1,000 mg 200 mL/hr over 60 Minutes Intravenous  Once 02/12/20 1305 02/12/20 1532   02/11/20 2100  cefTRIAXone (ROCEPHIN) 2 g in sodium chloride 0.9 % 100 mL IVPB  Status:  Discontinued     2 g 200 mL/hr over 30 Minutes Intravenous Every 24 hours 02/11/20 2050 02/11/20 2220      REVIEW OF SYSTEMS:  Const: negative fever, negative chills, negative weight loss Eyes: negative diplopia or visual changes, negative eye pain ENT: negative coryza, negative sore throat Resp: negative cough, hemoptysis, dyspnea Cards: negative for chest pain, palpitations, lower extremity edema GU: negative for frequency, dysuria and hematuria GI: Negative for abdominal pain, diarrhea, bleeding, constipation Skin: skin very thin Heme: negative for easy bruising and gum/nose bleeding MS: negative for myalgias, arthralgias, back pain and muscle weakness Neurolo:negative for headaches, dizziness, vertigo, memory problems  Psych: negative for feelings of anxiety, depression  Endocrine: negative for thyroid, diabetes Allergy/Immunology- negative for any medication or food allergies ?  Objective:  VITALS:  BP (!) 167/100 (BP Location: Left Arm)   Pulse 92   Temp 98 F (36.7 C) (Oral)   Resp 16   Ht 5' (1.524 m)   Wt 40.8 kg   SpO2 95%   BMI 17.58 kg/m  PHYSICAL EXAM:  General: Alert, cooperative, no distress, appears stated age.  Head: Normocephalic, without obvious abnormality, atraumatic. Eyes: Conjunctivae clear, anicteric sclerae. Pupils  are equal ENT Nares normal. No drainage or sinus tenderness. Lips, mucosa, and tongue normal. No Thrush Neck: Supple, Lungs: b/l air entry Heart: irregular Abdomen: Soft,  Extremities: rt shin Wound undermined edges, purplish edge- silver sulfadiazine cream cleaned and wound reviewed Nodular granular tissue  02/18/20   On admission    Skin: No rashes or lesions. Or bruising Lymph: Cervical, supraclavicular normal. Neurologic: Grossly non-focal Pertinent Labs Lab Results CBC    Component Value Date/Time   WBC 6.5 02/18/2020 0528   RBC 4.74 02/18/2020 0528   HGB 15.7 (H) 02/18/2020 0528   HGB 16.3 (H) 08/20/2012 1429   HCT 43.7 02/18/2020 0528   HCT 49.9 (H) 08/20/2012 1429   PLT 323 02/18/2020 0528   PLT 232 08/20/2012 1429   MCV 92.2 02/18/2020 0528   MCV 94 08/20/2012 1429   MCH 33.1 02/18/2020 0528   MCHC 35.9 02/18/2020 0528   RDW 12.3 02/18/2020 0528   RDW 14.5 08/20/2012 1429   LYMPHSABS 1.9 02/11/2020 1716   LYMPHSABS 1.8 08/20/2012 1429   MONOABS 0.7 02/11/2020 1716   MONOABS 0.4 08/20/2012 1429   EOSABS 0.1 02/11/2020 1716   EOSABS 0.0 08/20/2012 1429   BASOSABS 0.1 02/11/2020 1716   BASOSABS 0.0 08/20/2012 1429    CMP Latest Ref Rng & Units 02/18/2020 02/16/2020 02/13/2020  Glucose 70 - 99 mg/dL 92 93 91  BUN 8 - 23 mg/dL 7(L) 8 11  Creatinine 0.44 - 1.00 mg/dL 0.47 0.55 0.60  Sodium 135 - 145 mmol/L 131(L) 131(L) 136  Potassium 3.5 - 5.1 mmol/L 3.4(L) 3.4(L) 4.2  Chloride 98 - 111  mmol/L 93(L) 95(L) 100  CO2 22 - 32 mmol/L 26 27 24   Calcium 8.9 - 10.3 mg/dL 8.4(L) 8.4(L) 8.7(L)  Total Protein 6.5 - 8.1 g/dL - - -  Total Bilirubin 0.3 - 1.2 mg/dL - - -  Alkaline Phos 38 - 126 U/L - - -  AST 15 - 41 U/L - - -  ALT 0 - 44 U/L - - -      Microbiology: Recent Results (from the past 240 hour(s))  Culture, blood (Routine x 2)     Status: None   Collection Time: 02/11/20  5:16 PM   Specimen: BLOOD  Result Value Ref Range Status   Specimen  Description BLOOD BLOOD RIGHT FOREARM  Final   Special Requests   Final    BOTTLES DRAWN AEROBIC AND ANAEROBIC Blood Culture adequate volume   Culture   Final    NO GROWTH 5 DAYS Performed at [facility], [address] [place], [place] [address]    Report Status 02/16/2020 FINAL  Final  Culture, blood (Routine x 2)     Status: None   Collection Time: 02/11/20  5:16 PM   Specimen: BLOOD  Result Value Ref Range Status   Specimen Description   Final    BLOOD Blood Culture results may not be optimal due to an inadequate volume of blood received in culture bottles   Special Requests   Final    BOTTLES DRAWN AEROBIC AND ANAEROBIC RIGHT ANTECUBITAL   Culture   Final    NO GROWTH 5 DAYS Performed at [facility], [address] [place], [place] [address]    Report Status 02/16/2020 FINAL  Final  SARS CORONAVIRUS 2 (TAT 6-24 HRS) Nasopharyngeal Nasopharyngeal Swab     Status: None   Collection Time: 02/11/20  9:25 PM   Specimen: Nasopharyngeal Swab  Result Value Ref Range Status   SARS Coronavirus 2 NEGATIVE NEGATIVE Final    Comment: (NOTE) SARS-CoV-2 target nucleic acids are NOT DETECTED. The SARS-CoV-2 RNA is generally detectable in upper and lower respiratory specimens during the acute phase of infection. Negative results do not preclude SARS-CoV-2 infection, do not rule out co-infections with other pathogens, and should not be used as the sole basis for treatment or other patient management decisions. Negative results must be combined with clinical observations, patient history, and epidemiological information. The expected result is Negative. Fact Sheet for Patients: SugarRoll.be Fact Sheet for Healthcare Providers: https://www.woods-mathews.com/ This test is not yet approved or cleared by the [place] FDA and  has been authorized for detection and/or diagnosis of SARS-CoV-2 by FDA under an Emergency Use  Authorization (EUA). This EUA will remain  in effect (meaning this test can be used) for the duration of the COVID-19 declaration under Section 56 4(b)(1) of the Act, 21 U.S.C. section 360bbb-3(b)(1), unless the authorization is terminated or revoked sooner. Performed at [facility], [place] [address] [place], [place] [address]     IMAGING RESULTS: CXR rt leg Soft tissue edema without soft tissue air or radiopaque foreign body. No radiographic findings of osteomyelitis  I have personally reviewed the films ? Impression/Recommendation ? ?Rt shin wound folllowing a tear susatined while moving her during kyphoplasty on 01/20/20 On examination the wound has a purple edge and undermined edges and has not really responded to antibiotics raising concern for pyoderma gangrenosum. She had a left shin wound after skin tear and that took months to heal She has no h/o UC, crohns or RA.  Has a family h/o crohns ( both sons) and mom had ulcerative colitis I sent cultures 9 bacterial and AFB today) but I doubt this in infection. Need Derm consult ( as Op ) and biopsy taken from the edge Will also talk to [facility] and compare  pictures he would have taken 3 weeks ago Currently on cefepime and linezolid- DC both-as no significant improvement with 6 days of broad spectrum antibiotics. Continue local wound care.  ?Afib- on coumadin  HTN- on losartan Recent kyphoplasty T6T9 for compression fractures ___________________________________________________ Discussed with patient and her husband

## 2020-02-18 NOTE — Progress Notes (Signed)
Physical Therapy Treatment Patient Details Name: [name] MRN: [id number] DOB: [birth year] Today's Date: 02/18/2020    History of Present Illness Pt admitted for R LE cellulitis secondary to wound from mechanical injury. History of Afib, dementia, and HTN.    PT Comments    Pt is making good progress towards goals with ability to progress to standing there-ex this date and ambulating around RN station with reciprocal gait pattern. Appears to present with less pain this date. Still remains confused, at baseline.   Follow Up Recommendations  No PT follow up     Equipment Recommendations  None recommended by PT    Recommendations for Other Services       Precautions / Restrictions Precautions Precautions: Fall Restrictions Weight Bearing Restrictions: No    Mobility  Bed Mobility Overal bed mobility: Independent             General bed mobility comments: safe technique with ease of transfer noted  Transfers Overall transfer level: Modified independent Equipment used: Rolling walker (2 wheeled)             General transfer comment: decreased eccentric control with cues for reaching back towards bed with UE prior to sitting down  Ambulation/Gait Ambulation/Gait assistance: Supervision Gait Distance (Feet): 200 Feet Assistive device: Rolling walker (2 wheeled) Gait Pattern/deviations: Step-through pattern Gait velocity: 10' in 6 seconds   General Gait Details: ambulated around RN station with RW. 1 LOB noted during turn when she got outside of RW frame. CGA for regaining balance.   Stairs             Wheelchair Mobility    Modified Rankin (Stroke Patients Only)       Balance Overall balance assessment: Mild deficits observed, not formally tested                                          Cognition Arousal/Alertness: Awake/alert Behavior During Therapy: WFL for tasks assessed/performed Overall Cognitive Status: History  of cognitive impairments - at baseline                                        Exercises Other Exercises Other Exercises: Supine/standing ther-ex performed on B LE including hip abd/add, SLRs,and AP. Also performed standing alt toe taps on shelf, and mini squats. All ther-ex performed x 15 reps with supervision    General Comments        Pertinent Vitals/Pain Pain Assessment: Faces Faces Pain Scale: Hurts a little bit Pain Location: R LE Pain Descriptors / Indicators: Discomfort;Constant Pain Intervention(s): Limited activity within patient's tolerance    Home Living                      Prior Function            PT Goals (current goals can now be found in the care plan section) Acute Rehab PT Goals Patient Stated Goal: to go home PT Goal Formulation: With patient Time For Goal Achievement: 02/26/20 Potential to Achieve Goals: Good Progress towards PT goals: Progressing toward goals    Frequency    Min 2X/week      PT Plan Current plan remains appropriate    Co-evaluation  AM-PAC PT "6 Clicks" Mobility   Outcome Measure  Help needed turning from your back to your side while in a flat bed without using bedrails?: None Help needed moving from lying on your back to sitting on the side of a flat bed without using bedrails?: None Help needed moving to and from a bed to a chair (including a wheelchair)?: None Help needed standing up from a chair using your arms (e.g., wheelchair or bedside chair)?: None Help needed to walk in hospital room?: None Help needed climbing 3-5 steps with a railing? : None 6 Click Score: 24    End of Session Equipment Utilized During Treatment: Gait belt Activity Tolerance: Patient limited by pain Patient left: in bed;with bed alarm set Nurse Communication: Mobility status PT Visit Diagnosis: Difficulty in walking, not elsewhere classified (R26.2);Pain Pain - Right/Left: Right Pain - part of  body: Leg     Time: [id number] PT Time Calculation (min) (ACUTE ONLY): 23 min  Charges:  $Gait Training: 8-22 mins $Therapeutic Exercise: 8-22 mins                     Greggory Stallion, PT, DPT [phone]    Stefanos Haynesworth 02/18/2020, 12:57 PM

## 2020-02-19 DIAGNOSIS — E871 Hypo-osmolality and hyponatremia: Secondary | ICD-10-CM | POA: Diagnosis not present

## 2020-02-19 DIAGNOSIS — R413 Other amnesia: Secondary | ICD-10-CM

## 2020-02-19 DIAGNOSIS — E876 Hypokalemia: Secondary | ICD-10-CM | POA: Diagnosis not present

## 2020-02-19 DIAGNOSIS — I10 Essential (primary) hypertension: Secondary | ICD-10-CM | POA: Diagnosis not present

## 2020-02-19 DIAGNOSIS — L03115 Cellulitis of right lower limb: Secondary | ICD-10-CM | POA: Diagnosis not present

## 2020-02-19 LAB — PROTIME-INR
INR: 2.8 — ABNORMAL HIGH (ref 0.8–1.2)
Prothrombin Time: 28.6 seconds — ABNORMAL HIGH (ref 11.4–15.2)

## 2020-02-19 LAB — C-REACTIVE PROTEIN: CRP: 0.5 mg/dL (ref <1.0)

## 2020-02-19 MED ORDER — ENSURE ENLIVE PO LIQD: 237.0000 mL | Freq: Two times a day (BID) | ORAL | 0 refills | Status: DC

## 2020-02-19 MED ORDER — WARFARIN SODIUM 4 MG PO TABS
4.0000 mg | ORAL_TABLET | Freq: Every day | ORAL | 11 refills | Status: AC
Start: 2020-02-19 — End: 2020-03-20

## 2020-02-19 MED ORDER — SILVER SULFADIAZINE 1 % EX CREA: TOPICAL_CREAM | Freq: Every day | CUTANEOUS | 0 refills | Status: DC

## 2020-02-19 MED ORDER — WARFARIN SODIUM 3 MG PO TABS
3.0000 mg | ORAL_TABLET | Freq: Once | ORAL | Status: DC
  Filled 2020-02-19: qty 1

## 2020-02-19 MED ORDER — AMLODIPINE BESYLATE 5 MG PO TABS
5.0000 mg | ORAL_TABLET | ORAL | Status: AC
  Administered 2020-02-19: 5 mg via ORAL
  Filled 2020-02-19: qty 1

## 2020-02-19 NOTE — Care Management Important Message (Signed)
Important Message  Patient Details  Name: [name] MRN: [id number] Date of Birth: 11/26/1925   Medicare Important Message Given:  Yes     Juliann Pulse A Mulan Adan 02/19/2020, 10:12 AM

## 2020-02-19 NOTE — TOC Transition Note (Addendum)
Transition of Care [facility]) - CM/SW Discharge Note   Patient Details  Name: [name] MRN: [id number] Date of Birth: 1926/08/03  Transition of Care [facility] CM/SW Contact:  Elease Hashimoto, LCSW Phone Number: 02/19/2020, 9:21 AM   Clinical Narrative:   MD feels pt is medically ready for discharge today. Has DC antibiotics and will follow up with Wound Clinic and also have [facility] for RN services at DC. Husband can transport pt to follow up appointments and they are familiar with the wound clinic was going there prior to admission to the hospital. She has no equipment needs and husband provides supervision at home due to pt's dementia. Made [facility] aware of discharge today and orders in chart. No further follow due to DC today.  10:43 AM Have added HH aide per pt and husband   Final next level of care: Home w Home Health Services Barriers to Discharge: Barriers Resolved   Patient Goals and CMS Choice Patient states their goals for this hospitalization and ongoing recovery are:: Plan to go home after this   Choice offered to / list presented to : Patient  Discharge Placement                Patient to be transferred to facility by: Husband via car Name of family member notified: Timmothy Sours Patient and family notified of of transfer: 02/19/20  Discharge Plan and Services In-house Referral: Clinical Social Work                        HH Arranged: RN [place] Agency: [place] ([place]) Date [place]: 02/17/20 Time HH Agency Contacted: 1000 Representative spoke with at [place]: Corene Cornea  Social Determinants of Health ([place]) Interventions     Readmission Risk Interventions No flowsheet data found.

## 2020-02-19 NOTE — Consult Note (Signed)
[place] for Warfarin Indication: atrial fibrillation  No Known Allergies  Patient Measurements: Height: 5' (152.4 cm) Weight: 40.8 kg (90 lb) IBW/kg (Calculated) : 45.5  Vital Signs: Temp: 98.7 F (37.1 C) (04/29 1110) Temp Source: Oral (04/29 1110) BP: 176/92 (04/29 1110) Pulse Rate: 93 (04/29 1110)  Labs: Recent Labs    02/17/20 0639 02/18/20 0528 02/19/20 0515  HGB  --  15.7*  --   HCT  --  43.7  --   PLT  --  323  --   LABPROT 26.8* 25.0* 28.6*  INR 2.5* 2.4* 2.8*  CREATININE  --  0.47  --     Estimated Creatinine Clearance: 28.9 mL/min (by C-G formula based on SCr of 0.47 mg/dL).   Medical History: Past Medical History:  Diagnosis Date  . Atrial fibrillation ([place])   . 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 Hypertension   . Irregular heart beat   . Memory difficulty   . Meniere's disease     Medications:  PTA warfarin dose 5mg  qd - last taken 4/20 @ 1800   INR Warfarin dose 4/21  3.0   no warfarin 4/22  3.3   3 mg 4/22  2.9    4 mg 4/23  - 4 mg 4/24  3.3 2.5 mg 4/25  3.0 2.5 mg 4/26 3.0 3 mg  4/27   2.5 4 mg  4/28     2.4 4 mg 4/29 2.8  Assessment: Pharmacy has been consulted to resume warfarin dosing in 86 yo female with chronic a fib.  Goal of Therapy:  INR 2-3 Monitor platelets by anticoagulation protocol: Yes   Plan:  Will order  Warfarin 3 mg x 1 tonight DDI- pt on Cefepime,Zyvox.   Will follow INR daily and CBC a minimum of every 3 days per protocol.  Noralee Space, PharmD, BCPS Clinical Pharmacist 02/19/2020 11:43 AM

## 2020-02-19 NOTE — Discharge Summary (Signed)
[facility] [place] NAME: [name]    MR#:  [id number]  DATE OF BIRTH:  01/22/1926  DATE OF ADMISSION:  02/11/2020 ADMITTING PHYSICIAN: Charlynne Cousins, MD  DATE OF DISCHARGE: 02/19/2020  PRIMARY CARE PHYSICIAN: Derinda Late, MD    ADMISSION DIAGNOSIS:  Cellulitis of right leg [L03.115] Cellulitis of right lower extremity [L03.115] Dementia without behavioral disturbance, unspecified dementia type ([place]) [F03.90]  DISCHARGE DIAGNOSIS:  Principal Problem:   Cellulitis of right leg Active Problems:   Hypertension   Chronic atrial fibrillation (HCC)   MI (mitral incompetence)   TI (tricuspid incompetence)   Ulcer of right leg, with fat layer exposed ([place])   Dementia without behavioral disturbance (HCC)   Hyponatremia   Hypokalemia   SECONDARY DIAGNOSIS:   Past Medical History:  Diagnosis Date  . Atrial fibrillation ([place])   . 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 Hypertension   . Irregular heart beat   . Memory difficulty   . Meniere's disease     HOSPITAL COURSE:   1.  Right leg cellulitis and drainage with greenish material.  The patient has been in the hospital long period of time and has not totally improved.  I did get an infectious disease consultation.  Unfortunately wound culture not sent off on admission.  Wound culture sent off but may not grow anything secondary to being on Maxipime and Zyvox during the hospital course.  The patient will go home on no antibiotics.  Follow-up with dermatology because this could be pyoderma gangrenosum.  Follow-up at the wound care center.  Home health RN set up for wound care.  Wound must be kept covered.  Can clean with soap and water, then apply Silvadene cream and then cover with nonstick dressing and then Kerlix and paper tape.  Dr. Steva Ready infectious disease will follow up wound culture. 2.  Essential hypertension on Norvasc and losartan.  Blood pressure trended up  on the day of discharge I did give an extra dose of Norvasc.  Please continue to monitor as outpatient. 3.  Hyponatremia and hypokalemia.  Sodium 131 and potassium 3.4. 4.  Chronic atrial fibrillation.  Patient therapeutic on Coumadin at lower down the dose to 4 mg daily 5.  Memory difficulty  DISCHARGE CONDITIONS:  Satisfactory  CONSULTS OBTAINED:  Treatment Team:  Tsosie Billing, MD  DRUG ALLERGIES:  No Known Allergies  DISCHARGE MEDICATIONS:   Allergies as of 02/19/2020   No Known Allergies     Medication List    TAKE these medications   acetaminophen 500 MG tablet Commonly known as: TYLENOL Take 1,000 mg by mouth in the morning and at bedtime.   amLODipine 5 MG tablet Commonly known as: NORVASC Take 5 mg by mouth every evening.   feeding supplement (ENSURE ENLIVE) Liqd Take 237 mLs by mouth 2 (two) times daily between meals.   losartan 50 MG tablet Commonly known as: COZAAR Take 50 mg by mouth every evening.   memantine 5 MG tablet Commonly known as: NAMENDA Take 5 mg by mouth 2 (two) times daily.   silver sulfADIAZINE 1 % cream Commonly known as: SILVADENE Apply topically daily.   warfarin 4 MG tablet Commonly known as: Coumadin Take 1 tablet (4 mg total) by mouth daily. What changed:   medication strength  how much to take  when to take this        DISCHARGE INSTRUCTIONS:   Follow-up PMD 5 days  Follow-up dermatology as outpatient Follow-up at the wound care center  If you experience worsening of your admission symptoms, develop shortness of breath, life threatening emergency, suicidal or homicidal thoughts you must seek medical attention immediately by calling 911 or calling your MD immediately  if symptoms less severe.  You Must read complete instructions/literature along with all the possible adverse reactions/side effects for all the Medicines you take and that have been prescribed to you. Take any new Medicines after you have  completely understood and accept all the possible adverse reactions/side effects.   Please note  You were cared for by a hospitalist during your hospital stay. If you have any questions about your discharge medications or the care you received while you were in the hospital after you are discharged, you can call the unit and asked to speak with the hospitalist on call if the hospitalist that took care of you is not available. Once you are discharged, your primary care physician will handle any further medical issues. Please note that NO REFILLS for any discharge medications will be authorized once you are discharged, as it is imperative that you return to your primary care physician (or establish a relationship with a primary care physician if you do not have one) for your aftercare needs so that they can reassess your need for medications and monitor your lab values.    Today   CHIEF COMPLAINT:   Chief Complaint  Patient presents with  . Wound Check    HISTORY OF PRESENT ILLNESS:  [name]  is a 86 y.o. female sent in with a draining wound right lower extremity   VITAL SIGNS:  Blood pressure 165/94, pulse 84, temperature 98.7, respirations 18, pulse ox 95% on room air   PHYSICAL EXAMINATION:  GENERAL:  86 y.o.-year-old patient lying in the bed with no acute distress.  EYES: Pupils equal, round, reactive to light and accommodation. No scleral icterus. HEENT: Head atraumatic, normocephalic. Oropharynx and nasopharynx clear.  LUNGS: Normal breath sounds bilaterally, no wheezing, rales,rhonchi or crepitation. No use of accessory muscles of respiration.  CARDIOVASCULAR: S1, S2 normal.  2 out of 6 systolic murmurs. ABDOMEN: Soft, non-tender, non-distended. Bowel sounds present. No organomegaly or mass.  EXTREMITIES: No pedal edema, cyanosis, or clubbing.  NEUROLOGIC: Cranial nerves II through XII are intact. Muscle strength 5/5 in all extremities. Sensation intact. Gait not checked.   PSYCHIATRIC: The patient is alert and oriented x 3.  SKIN: Right lower extremity wound.  Today was already treated with Silvadene cream so I did not see any discoloration but nurse that it was greenish prior to cleaning and placing Silvadene cream.  DATA REVIEW:   CBC Recent Labs  Lab 02/18/20 0528  WBC 6.5  HGB 15.7*  HCT 43.7  PLT 323    Chemistries  Recent Labs  Lab 02/18/20 0528  NA 131*  K 3.4*  CL 93*  CO2 26  GLUCOSE 92  BUN 7*  CREATININE 0.47  CALCIUM 8.4*    Microbiology Results  Results for orders placed or performed during the hospital encounter of 02/11/20  Culture, blood (Routine x 2)     Status: None   Collection Time: 02/11/20  5:16 PM   Specimen: BLOOD  Result Value Ref Range Status   Specimen Description BLOOD BLOOD RIGHT FOREARM  Final   Special Requests   Final    BOTTLES DRAWN AEROBIC AND ANAEROBIC Blood Culture adequate volume   Culture   Final    NO GROWTH 5  DAYS Performed at [facility], [address] [place], [place] [address]    Report Status 02/16/2020 FINAL  Final  Culture, blood (Routine x 2)     Status: None   Collection Time: 02/11/20  5:16 PM   Specimen: BLOOD  Result Value Ref Range Status   Specimen Description   Final    BLOOD Blood Culture results may not be optimal due to an inadequate volume of blood received in culture bottles   Special Requests   Final    BOTTLES DRAWN AEROBIC AND ANAEROBIC RIGHT ANTECUBITAL   Culture   Final    NO GROWTH 5 DAYS Performed at [facility], [address] [place], [place] [address]    Report Status 02/16/2020 FINAL  Final  SARS CORONAVIRUS 2 (TAT 6-24 HRS) Nasopharyngeal Nasopharyngeal Swab     Status: None   Collection Time: 02/11/20  9:25 PM   Specimen: Nasopharyngeal Swab  Result Value Ref Range Status   SARS Coronavirus 2 NEGATIVE NEGATIVE Final    Comment: (NOTE) SARS-CoV-2 target nucleic acids are NOT DETECTED. The SARS-CoV-2 RNA is generally  detectable in upper and lower respiratory specimens during the acute phase of infection. Negative results do not preclude SARS-CoV-2 infection, do not rule out co-infections with other pathogens, and should not be used as the sole basis for treatment or other patient management decisions. Negative results must be combined with clinical observations, patient history, and epidemiological information. The expected result is Negative. Fact Sheet for Patients: SugarRoll.be Fact Sheet for Healthcare Providers: https://www.woods-mathews.com/ This test is not yet approved or cleared by the [place] FDA and  has been authorized for detection and/or diagnosis of SARS-CoV-2 by FDA under an Emergency Use Authorization (EUA). This EUA will remain  in effect (meaning this test can be used) for the duration of the COVID-19 declaration under Section 56 4(b)(1) of the Act, 21 U.S.C. section 360bbb-3(b)(1), unless the authorization is terminated or revoked sooner. Performed at [facility], [place] [address] [place], [place] [address]   Aerobic Culture (superficial specimen)     Status: None (Preliminary result)   Collection Time: 02/18/20  7:48 PM   Specimen: Wound  Result Value Ref Range Status   Specimen Description   Final    WOUND Performed at [facility], [address], [place], [place] [address]    Special Requests   Final    NONE Performed at [facility], [address] [place], [place] [address]    Gram Stain   Final    NO WBC SEEN NO ORGANISMS SEEN Performed at [facility], [place] [address] [place], [place] [address]    Culture PENDING  Incomplete   Report Status PENDING  Incomplete     Management plans discussed with the patient, family and they are in agreement.  CODE STATUS:     Code Status Orders  (From admission, onward)         Start     Ordered   02/11/20 2207  Full code  Continuous      02/11/20 2208        Code Status History    This patient has a current code status but no historical code status.   Advance Care Planning Activity    Advance Directive Documentation     Most Recent Value  Type of Advance Directive  Healthcare Power of Attorney  Pre-existing out of facility DNR order (yellow form or pink MOST form)  --  "  MOST" Form in Place?  --      TOTAL TIME TAKING CARE OF THIS PATIENT: 35 minutes.    Loletha Grayer M.D on 02/19/2020 at 3:42 PM  Between 7am to 6pm - Pager - [phone]  After 6pm go to [email or web address] - password [email or web address]  [facility]  CC: Primary care physician; Derinda Late, MD

## 2020-02-19 NOTE — Progress Notes (Signed)
ID Communicated with Dr.Robson ( wound clinic) regarding my concern for pyoderma gangrenosum Sent cultures yesterday but that may not be appropriate because of prior antibiotic use Asked him to repeat culture in a week No antibiotics on discharge as no improvement on IV vanco and Iv cefepime. Topical wound care May need skin/tissue biopsy  Will let her know once the culture results taken yesterday gets resulted Discussed with Dr.Wieting

## 2020-02-21 LAB — AEROBIC CULTURE W GRAM STAIN (SUPERFICIAL SPECIMEN)
Culture: NO GROWTH
Gram Stain: NONE SEEN

## 2020-02-22 LAB — ACID FAST SMEAR (AFB, MYCOBACTERIA): Acid Fast Smear: NEGATIVE

## 2020-02-23 ENCOUNTER — Encounter: Payer: Medicare Other | Attending: Physician Assistant | Admitting: Physician Assistant

## 2020-02-23 ENCOUNTER — Other Ambulatory Visit: Payer: Self-pay

## 2020-02-23 DIAGNOSIS — G309 Alzheimer's disease, unspecified: Secondary | ICD-10-CM | POA: Insufficient documentation

## 2020-02-23 DIAGNOSIS — I87321 Chronic venous hypertension (idiopathic) with inflammation of right lower extremity: Secondary | ICD-10-CM | POA: Insufficient documentation

## 2020-02-23 DIAGNOSIS — I482 Chronic atrial fibrillation, unspecified: Secondary | ICD-10-CM | POA: Insufficient documentation

## 2020-02-23 DIAGNOSIS — W2203XD Walked into furniture, subsequent encounter: Secondary | ICD-10-CM | POA: Diagnosis not present

## 2020-02-23 DIAGNOSIS — Z7901 Long term (current) use of anticoagulants: Secondary | ICD-10-CM | POA: Diagnosis not present

## 2020-02-23 DIAGNOSIS — S81811D Laceration without foreign body, right lower leg, subsequent encounter: Secondary | ICD-10-CM | POA: Diagnosis not present

## 2020-02-23 DIAGNOSIS — L88 Pyoderma gangrenosum: Secondary | ICD-10-CM | POA: Insufficient documentation

## 2020-02-23 DIAGNOSIS — I10 Essential (primary) hypertension: Secondary | ICD-10-CM | POA: Insufficient documentation

## 2020-02-23 DIAGNOSIS — I872 Venous insufficiency (chronic) (peripheral): Secondary | ICD-10-CM | POA: Insufficient documentation

## 2020-02-23 DIAGNOSIS — L97812 Non-pressure chronic ulcer of other part of right lower leg with fat layer exposed: Secondary | ICD-10-CM | POA: Insufficient documentation

## 2020-02-23 NOTE — Progress Notes (Addendum)
DOSHIA, DILLEHAY ([id number]) Visit Report for 02/23/2020 Arrival Information Details [name], [name]. Date of Service: 02/23/2020 3:00 PM Medical Record Number: [id number] Patient Account Number: [id number] Date of Birth/Sex: 01-25-1926 (86 y.o. F) Treating RN: Army Melia Primary Care Maedell Hedger: BABAOFF, MARCUS Other Clinician: Referring Lexie Koehl: BABAOFF, MARCUS Treating Albi Rappaport/Extender: STONE III, HOYT Weeks in Treatment: 3 Visit Information History Since Last Visit Added or deleted any medications: No Patient Arrived: Ambulatory Any new allergies or adverse reactions: No Arrival Time: 15:07 Had a fall or experienced change in No Accompanied By: daughter-in-law activities of daily living that may affect Transfer Assistance: None risk of falls: Patient Identification Verified: Yes Signs or symptoms of abuse/neglect since last visito No Secondary Verification Process Completed: Yes Hospitalized since last visit: No Patient Has Alerts: Yes Implantable device outside of the clinic excluding No Patient Alerts: Patient on Blood Thinner cellular tissue based products placed in the center warfarin since last visit: Has Dressing in Place as Prescribed: Yes Pain Present Now: Yes Electronic Signature(s) Signed: 02/23/2020 3:23:18 PM By: Lorine Bears RCP, RRT, CHT Entered By: Lorine Bears on 02/23/2020 15:11:54 [name], [name] ([id number]) -------------------------------------------------------------------------------- Clinic Level of Care Assessment Details [name]. Date of Service: 02/23/2020 3:00 PM Medical Record Number: [id number] Patient Account Number: [id number] Date of Birth/Sex: Feb 23, 1926 (86 y.o. F) Treating RN: Army Melia Primary Care Tramya Schoenfelder: BABAOFF, MARCUS Other Clinician: Referring Naim Murtha: BABAOFF, MARCUS Treating Cletus Paris/Extender: STONE III, HOYT Weeks in Treatment: 3 Clinic Level of Care  Assessment Items TOOL 4 Quantity Score []  - Use when only an EandM is performed on FOLLOW-UP visit 0 ASSESSMENTS - Nursing Assessment / Reassessment X - Reassessment of Co-morbidities (includes updates in patient status) 1 10 X- 1 5 Reassessment of Adherence to Treatment Plan ASSESSMENTS - Wound and Skin Assessment / Reassessment X - Simple Wound Assessment / Reassessment - one wound 1 5 []  - 0 Complex Wound Assessment / Reassessment - multiple wounds []  - 0 Dermatologic / Skin Assessment (not related to wound area) ASSESSMENTS - Focused Assessment []  - Circumferential Edema Measurements - multi extremities 0 []  - 0 Nutritional Assessment / Counseling / Intervention []  - 0 Lower Extremity Assessment (monofilament, tuning fork, pulses) []  - 0 Peripheral Arterial Disease Assessment (using hand held doppler) ASSESSMENTS - Ostomy and/or Continence Assessment and Care []  - Incontinence Assessment and Management 0 []  - 0 Ostomy Care Assessment and Management (repouching, etc.) PROCESS - Coordination of Care X - Simple Patient / Family Education for ongoing care 1 15 []  - 0 Complex (extensive) Patient / Family Education for ongoing care []  - 0 Staff obtains Programmer, systems, Records, Test Results / Process Orders []  - 0 Staff telephones HHA, Nursing Homes / Clarify orders / etc []  - 0 Routine Transfer to another Facility (non-emergent condition) []  - 0 Routine Hospital Admission (non-emergent condition) []  - 0 New Admissions / Biomedical engineer / Ordering NPWT, Apligraf, etc. []  - 0 Emergency Hospital Admission (emergent condition) X- 1 10 Simple Discharge Coordination []  - 0 Complex (extensive) Discharge Coordination PROCESS - Special Needs []  - Pediatric / Minor Patient Management 0 []  - 0 Isolation Patient Management []  - 0 Hearing / Language / Visual special needs []  - 0 Assessment of Community assistance (transportation, D/C planning, etc.) []  - 0 Additional  assistance / Altered mentation []  - 0 Support Surface(s) Assessment (bed, cushion, seat, etc.) INTERVENTIONS - Wound Cleansing / Measurement [name], [name] ([id number]) X- 1 5 Simple Wound Cleansing - one  wound []  - 0 Complex Wound Cleansing - multiple wounds X- 1 5 Wound Imaging (photographs - any number of wounds) []  - 0 Wound Tracing (instead of photographs) X- 1 5 Simple Wound Measurement - one wound []  - 0 Complex Wound Measurement - multiple wounds INTERVENTIONS - Wound Dressings []  - Small Wound Dressing one or multiple wounds 0 X- 1 15 Medium Wound Dressing one or multiple wounds []  - 0 Large Wound Dressing one or multiple wounds []  - 0 Application of Medications - topical []  - 0 Application of Medications - injection INTERVENTIONS - Miscellaneous []  - External ear exam 0 []  - 0 Specimen Collection (cultures, biopsies, blood, body fluids, etc.) []  - 0 Specimen(s) / Culture(s) sent or taken to Lab for analysis []  - 0 Patient Transfer (multiple staff / Civil Service fast streamer / Similar devices) []  - 0 Simple Staple / Suture removal (25 or less) []  - 0 Complex Staple / Suture removal (26 or more) []  - 0 Hypo / Hyperglycemic Management (close monitor of Blood Glucose) []  - 0 Ankle / Brachial Index (ABI) - do not check if billed separately X- 1 5 Vital Signs Has the patient been seen at the hospital within the last three years: Yes Total Score: 80 Level Of Care: New/Established - Level 3 Electronic Signature(s) Signed: 02/23/2020 4:19:51 PM By: Army Melia Entered By: Army Melia on 02/23/2020 15:55:48 [name], [name] ([id number]) -------------------------------------------------------------------------------- Encounter Discharge Information Details [name]. Date of Service: 02/23/2020 3:00 PM Medical Record Number: [id number] Patient Account Number: [id number] Date of Birth/Sex: [birth year] (86 y.o. F) Treating RN: Army Melia Primary Care  Shley Dolby: Derinda Late Other Clinician: Referring Altha Sweitzer: BABAOFF, MARCUS Treating Aitan Rossbach/Extender: Melburn Hake, HOYT Weeks in Treatment: 3 Encounter Discharge Information Items Discharge Condition: Stable Ambulatory Status: Ambulatory Discharge Destination: Home Transportation: Private Auto Accompanied By: family Schedule Follow-up Appointment: Yes Clinical Summary of Care: Electronic Signature(s) Signed: 02/23/2020 4:19:51 PM By: Army Melia Entered By: Army Melia on 02/23/2020 15:57:05 [name], [name] ([id number]) -------------------------------------------------------------------------------- Lower Extremity Assessment Details [name]. Date of Service: 02/23/2020 3:00 PM Medical Record Number: [id number] Patient Account Number: [id number] Date of Birth/Sex: 1926-06-14 (86 y.o. F) Treating RN: Montey Hora Primary Care Roniya Tetro: BABAOFF, MARCUS Other Clinician: Referring Virdie Penning: BABAOFF, MARCUS Treating Paysley Poplar/Extender: STONE III, HOYT Weeks in Treatment: 3 Edema Assessment Assessed: [Left: No] [Right: No] Edema: [Left: Ye] [Right: s] Calf Left: Right: Point of Measurement: 30 cm From Medial Instep cm 31 cm Ankle Left: Right: Point of Measurement: 10 cm From Medial Instep cm 19 cm Vascular Assessment Pulses: Dorsalis Pedis Palpable: [Right:Yes] Electronic Signature(s) Signed: 02/23/2020 4:26:56 PM By: Montey Hora Entered By: Montey Hora on 02/23/2020 15:17:34 [name], [name] ([id number]) -------------------------------------------------------------------------------- Multi Wound Chart Details [name]. Date of Service: 02/23/2020 3:00 PM Medical Record Number: [id number] Patient Account Number: [id number] Date of Birth/Sex: [birth year] (86 y.o. F) Treating RN: Army Melia Primary Care Delitha Elms: BABAOFF, MARCUS Other Clinician: Referring Deniro Laymon: BABAOFF, MARCUS Treating Hanaa Payes/Extender: STONE III,  HOYT Weeks in Treatment: 3 Vital Signs Height(in): 62 Pulse(bpm): 87 Weight(lbs): 1 Blood Pressure(mmHg): 158/97 Body Mass Index(BMI): 18 Temperature(F): 98.3 Respiratory Rate(breaths/min): 16 Photos: [N/A:N/A] Wound Location: Right, Lateral Lower Leg N/A N/A Wounding Event: Trauma N/A N/A Primary Etiology: Venous Leg Ulcer N/A N/A Secondary Etiology: Trauma, Other N/A N/A Comorbid History: Cataracts, Deep Vein Thrombosis, N/A N/A Hypertension, Osteoarthritis, Dementia Date Acquired: 01/20/2020 N/A N/A Weeks of Treatment: 3 N/A N/A Wound Status: Open N/A N/A Measurements L x  W x D (cm) 7.3x4.5x0.2 N/A N/A Area (cm) : 25.8 N/A N/A Volume (cm) : 5.16 N/A N/A % Reduction in Area: -82.50% N/A N/A % Reduction in Volume: -82.50% N/A N/A Classification: Full Thickness Without Exposed N/A N/A Support Structures Exudate Amount: Medium N/A N/A Exudate Type: Purulent N/A N/A Exudate Color: yellow, brown, green N/A N/A Wound Margin: Flat and Intact N/A N/A Granulation Amount: Medium (34-66%) N/A N/A Granulation Quality: Pink N/A N/A Necrotic Amount: Medium (34-66%) N/A N/A Exposed Structures: Fat Layer (Subcutaneous Tissue) N/A N/A Exposed: Yes Fascia: No Tendon: No Muscle: No Joint: No Bone: No Epithelialization: None N/A N/A Treatment Notes Electronic Signature(s) Signed: 02/23/2020 4:19:51 PM By: Army Melia Entered By: Army Melia on 02/23/2020 [name], [name] ([id number]) [name], [name] ([id number]) -------------------------------------------------------------------------------- [place] Details [name]. Date of Service: 02/23/2020 3:00 PM Medical Record Number: [id number] Patient Account Number: [id number] Date of Birth/Sex: 03/04/1926 (86 y.o. F) Treating RN: Army Melia Primary Care Chermaine Schnyder: Derinda Late Other Clinician: Referring Jkayla Spiewak: BABAOFF, MARCUS Treating Chandi Nicklin/Extender: Melburn Hake,  HOYT Weeks in Treatment: 3 Active Inactive Necrotic Tissue Nursing Diagnoses: Impaired tissue integrity related to necrotic/devitalized tissue Goals: Necrotic/devitalized tissue will be minimized in the wound bed Date Initiated: 01/28/2020 Target Resolution Date: 02/04/2020 Goal Status: Active Interventions: Assess patient pain level pre-, during and post procedure and prior to discharge Treatment Activities: Apply topical anesthetic as ordered : 01/28/2020 Notes: Orientation to the Wound Care Program Nursing Diagnoses: Knowledge deficit related to the wound healing center program Goals: Patient/caregiver will verbalize understanding of the [place] Date Initiated: 01/28/2020 Target Resolution Date: 02/04/2020 Goal Status: Active Interventions: Provide education on orientation to the wound center Notes: Soft Tissue Infection Nursing Diagnoses: Impaired tissue integrity Goals: Patient will remain free of wound infection Date Initiated: 01/28/2020 Target Resolution Date: 02/11/2020 Goal Status: Active Interventions: Assess signs and symptoms of infection every visit Notes: Wound/Skin Impairment Nursing Diagnoses: Impaired tissue integrity Goals: Ulcer/skin breakdown will have a volume reduction of 30% by week 4 [name], [name] ([id number]) Date Initiated: 01/28/2020 Target Resolution Date: 02/27/2020 Goal Status: Active Interventions: Assess ulceration(s) every visit Treatment Activities: Skin care regimen initiated : 01/28/2020 Topical wound management initiated : 01/28/2020 Notes: Electronic Signature(s) Signed: 02/23/2020 4:19:51 PM By: Army Melia Entered By: Army Melia on 02/23/2020 15:42:54 [name], [name] ([id number]) -------------------------------------------------------------------------------- Pain Assessment Details [name]. Date of Service: 02/23/2020 3:00 PM Medical Record Number: [id number] Patient Account Number:  [id number] Date of Birth/Sex: [birth year] (86 y.o. F) Treating RN: Montey Hora Primary Care Ether Goebel: Derinda Late Other Clinician: Referring Ghina Bittinger: BABAOFF, MARCUS Treating Gloria Lambertson/Extender: STONE III, HOYT Weeks in Treatment: 3 Active Problems Location of Pain Severity and Description of Pain Patient Has Paino Yes Site Locations Pain Location: Pain in Ulcers With Dressing Change: Yes Duration of the Pain. Constant / Intermittento Constant Pain Management and Medication Current Pain Management: Electronic Signature(s) Signed: 02/23/2020 4:26:56 PM By: Montey Hora Entered By: Montey Hora on 02/23/2020 15:16:31 [name] ([id number]) -------------------------------------------------------------------------------- Patient/Caregiver Education Details [name] Date of Service: 02/23/2020 3:00 PM Medical Record Number: [id number] Patient Account Number: [id number] Date of Birth/Gender: 1926-09-09 (86 y.o. F) Treating RN: Army Melia Primary Care Physician: Derinda Late Other Clinician: Referring Physician: BABAOFF, MARCUS Treating Physician/Extender: Melburn Hake, HOYT Weeks in Treatment: 3 Education Assessment Education Provided To: Patient Education Topics Provided Wound/Skin Impairment: Handouts: Caring for Your Ulcer Methods: Demonstration, Explain/Verbal Responses: State content correctly Electronic Signature(s) Signed: 02/23/2020  4:19:51 PM By: Army Melia Entered By: Army Melia on 02/23/2020 15:55:59 [name], [name] ([id number]) -------------------------------------------------------------------------------- Wound Assessment Details [name]. Date of Service: 02/23/2020 3:00 PM Medical Record Number: [id number] Patient Account Number: [id number] Date of Birth/Sex: 03-22-1926 (86 y.o. F) Treating RN: Montey Hora Primary Care Bradford Cazier: BABAOFF, MARCUS Other Clinician: Referring Keymarion Bearman: BABAOFF,  MARCUS Treating Fynn Vanblarcom/Extender: STONE III, HOYT Weeks in Treatment: 3 Wound Status Wound Number: 3 Primary Venous Leg Ulcer Etiology: Wound Location: Right, Lateral Lower Leg Secondary Trauma, Other Wounding Event: Trauma Etiology: Date Acquired: 01/20/2020 Wound Status: Open Weeks Of Treatment: 3 Comorbid Cataracts, Deep Vein Thrombosis, Hypertension, Clustered Wound: No History: Osteoarthritis, Dementia Photos Wound Measurements Length: (cm) 7.3 Width: (cm) 4.5 Depth: (cm) 0.2 Area: (cm) 25.8 Volume: (cm) 5.16 % Reduction in Area: -82.5% % Reduction in Volume: -82.5% Epithelialization: None Tunneling: No Undermining: No Wound Description Classification: Full Thickness Without Exposed Support Structu Wound Margin: Flat and Intact Exudate Amount: Medium Exudate Type: Purulent Exudate Color: yellow, brown, green res Foul Odor After Cleansing: No Slough/Fibrino Yes Wound Bed Granulation Amount: Medium (34-66%) Exposed Structure Granulation Quality: Pink Fascia Exposed: No Necrotic Amount: Medium (34-66%) Fat Layer (Subcutaneous Tissue) Exposed: Yes Necrotic Quality: Adherent Slough Tendon Exposed: No Muscle Exposed: No Joint Exposed: No Bone Exposed: No Treatment Notes Wound #3 (Right, Lateral Lower Leg) Notes hblue, ABD kerlix, coban Electronic Signature(s) MAYKALA, OCKER ([id number]) Signed: 02/23/2020 4:26:56 PM By: Montey Hora Entered By: Montey Hora on 02/23/2020 15:20:39 [name] ([id number]) -------------------------------------------------------------------------------- Vitals Details [name]. Date of Service: 02/23/2020 3:00 PM Medical Record Number: [id number] Patient Account Number: [id number] Date of Birth/Sex: [birth year] (86 y.o. F) Treating RN: Army Melia Primary Care Aran Menning: BABAOFF, MARCUS Other Clinician: Referring Pearlie Nies: BABAOFF, MARCUS Treating Lise Pincus/Extender: STONE III, HOYT Weeks in  Treatment: 3 Vital Signs Time Taken: 15:10 Temperature (F): 98.3 Height (in): 62 Pulse (bpm): 87 Weight (lbs): 98 Respiratory Rate (breaths/min): 16 Body Mass Index (BMI): 17.9 Blood Pressure (mmHg): 158/97 Reference Range: 80 - 120 mg / dl Electronic Signature(s) Signed: 02/23/2020 3:23:18 PM By: Lorine Bears RCP, RRT, CHT Entered By: Lorine Bears on 02/23/2020 15:12:32

## 2020-02-23 NOTE — Progress Notes (Addendum)
[name], [name] ([id number]) Visit Report for 02/23/2020 Chief Complaint Document Details [name], [name]. Date of Service: 02/23/2020 3:00 PM Medical Record Number: [id number] Patient Account Number: [id number] Date of Birth/Sex: 02-19-1926 (86 y.o. F) Treating RN: Army Melia Primary Care Provider: Derinda Late Other Clinician: Referring Provider: BABAOFF, MARCUS Treating Provider/Extender: Melburn Hake, Ariyona Eid Weeks in Treatment: 3 Information Obtained from: Patient Chief Complaint Left anterior shin ulcer 01/28/2020; patient is here for review of wound on her right anterior lower shin Electronic Signature(s) Signed: 02/23/2020 3:05:23 PM By: Worthy Keeler PA-C Entered By: Worthy Keeler on 02/23/2020 15:05:22 Longanecker, [name] ([id number]) -------------------------------------------------------------------------------- HPI Details [name]. Date of Service: 02/23/2020 3:00 PM Medical Record Number: [id number] Patient Account Number: [id number] Date of Birth/Sex: May 01, 1926 (86 y.o. F) Treating RN: Army Melia Primary Care Provider: Derinda Late Other Clinician: Referring Provider: BABAOFF, MARCUS Treating Provider/Extender: Melburn Hake, Bailey Faiella Weeks in Treatment: 3 History of Present Illness HPI Description: 06/06/17 on evaluation today patient presents for evaluation concerning an ulcer which she initially had arise as a result of striking her left lower extremity money bed frame. With that being said this was roughly one month ago and unfortunately the wound despite two courses of Keflex have not really made a dramatic improvement. This has continued to drain as well as being exquisitely tender at times. Even to the point that she is having a difficult time walking. Patient does have chronic atrial fibrillation which subsequently leads to her being on long-term anticoagulant therapy and this causes ED bruising which may have contributed to this  entry and where things are at this point. She does have valvular heart disease as well as hypertension and her last INR was 2.7. Currently Neosporin, Gauls, and an ace wrap has been utilized. Keflex is the antibiotic that she has been on. Patient has not had a culture up to this point and she states that her blood pressure at the primary care office yesterday was 138/72. Her pain is significant related to be an eight out of 10. No fevers, chills, nausea, or vomiting noted at this time. 06/20/17; patient was admitted here 2 weeks ago. She had a traumatic wound on her left lateral lower leg probably in the setting of some degree of venous insufficiency with surrounding cellulitis. She had been on Keflex, after we saw her she was put on doxycycline which she apparently did not tolerate. Culture that was done at the time showed Morganella which was resistant to Keflex. Apparently the patient continued to take Keflex after the appointment. She is also on Coumadin. I had tried to change her antibiotic when the Morganella culture was brought to my attention however apparently our staff could not reach the patient to inform them of the antibiotic change 06/26/17; patient has wound on her left lateral lower extremity in the setting of some degree of venous insufficiency. I gave her cefdinir last week and she is completing this. There is no evidence of surrounding infection. Aggressive debridement last week, wound bed looks healthier. We've been using Aquacel. They're traveling in the [place] next week we'll see her back in 2 weeks unless there are problems. 07/10/17; the patient is been using Aquacel Ag her husband is changing this with Kerlix and conform. She still complains of a lot of pain. Apparently with the antibiotics. We gave 2 or 3 weeks ago. Her INR went up to 6, this was managed by her primary physician 07/17/17; she is using Aquacel Ag  and a border foam. Still discomfort although dimensions are  better. 07/24/17; patient or for review of a trauma wound on her left anterior leg in the setting of some degree of chronic venous insufficiency she has been using Aquacel Ag and a border foam and making progress. 07/31/17; only a small open area of this original significant trauma is still open. Her husband is changing this every second day [Aquacel Ag] Readmission: 01/14/19 on evaluation today patient is that she seen for initial inspection during the office visit today concerning a trauma/skin tear to the left anterior lower extremity. This is roughly the same region where she was previously treated and years past when she was here in the clinic. Nonetheless upon evaluation today the patient's wound actually showed signs of decent granulation around the edge although unfortunately the skin flap that was torn back did fold under on itself and the flap itself is dying. She does have some eschar surrounded the edges of the wound at this point. Nonetheless this also was very tender for her. The patient does have a history of hypertension, atrial fibrillation, and long-term use anticoagulant therapy. 01/21/19 on evaluation today patient's wound on the anterior portion of her lower extremity appears to still be necrotic as far as the surface of the wound is concerned. Unfortunately there is no significant improvement at this point compared to last week they were not able to get the Santyl that are using Medihoney and maybe one reason why. The other is I think this may be stained to drive. Possibly adding something such as mepitel over top of the Medihoney may help to loosen this up quite a bit which I think would be in her best interest. Also she still has a lot of erythema and is very tender to touch I'm afraid that we may need to switch to a different antibiotic to see if this will be of benefit there still really nothing that I can culture to get a better picture of what's going on and what we need to do  from the standpoint of antibiotics. I do believe the patient needs a referral Talmuds me to vascular in order to evaluate her blood flow based on what I'm seeing they will be able to perform TBI testing if nothing else along with the vascular evaluation to see if there's any evidence for limited blood flow to this lower extremity. 4/8; I have reviewed the patient's arterial studies and they are really quite good. There should be no arterial impediments to healing the wound on the right anterior tibial area. 4/15; patient has a small open area over the left mid tibial area. Still requiring debridement we are using Santyl here and Medihoney at home [Santyl unaffordable] 4/22; difficult, presumably traumatic area over the left mid tibial area in the setting of chronic venous insufficiency. We have been using Santyl here and meta honey at home. The wound surface is getting gradually better and slight improvement in dimensions. Her husband is changing this daily 4/29; left mid tibia presumably traumatic wound in the setting of chronic venous insufficiency. We have been using Santyl and a combination of Medihoney at home. Arrives today with a much better looking healthy granulated surface. We will change the primary dressing to [facility] 5/6; left mid tibia smaller reasonably healthy looking wound using Hydrofera Blue since last week 5/20-Patient returns to clinic with a left mid tibial wound looking better, we have been using Hydrofera Blue since the previous week 5/27; patient returns to  clinic with a left mid tibial wound covered in raised eschar. We have been using Hydrofera Blue her husband changes the dressings using a border foam cover READMISSION 01/28/20 [name] is a patient who is now 86 years old. As usual she comes in with her husband who is her primary caregiver. They were here last from March through May 2020 with a traumatic wound on the left anterior tibia we eventually heal this  out. She has chronic venous insufficiency. She is also here previously in 2018. Her current problem started 8 days ago. She went in the hospital for a thoracic level kyphoplasty. The procedure she developed a skin tear on her leg. This was mentioned to her. Her husband is applying Medihoney to the area and they are in for our review of this. She finds this much too uncomfortable to attempt ABIs although her ABIs when she was in the clinic last year were quite normal bilaterally [name], [name] ([id number]) Past medical history she is not a diabetic. As noted she had a T9 and T6 kyphoplasty, Alzheimer's disease, chronic atrial fibrillation on Coumadin and hypertension 4/14; patient arrives in clinic having removed part of the wrap. We attempted to leave this on all week. Unfortunately none that none of the tissue that looks semiviable last week has remained viable. She required an extensive debridement. We have been using silver alginate 4/21; the patient arrived today with tremendous erythema around the large part of the wound on the right lateral leg. This extends medially and down towards her foot. I marked the area but I think the extent of the likely cellulitis is beyond what can be safely managed as an outpatient. 02/23/2020 upon evaluation today patient is seen today though she is typically followed by Dr. Dellia Nims. Last time he saw her he sent her to the hospital and she subsequently was in the hospital for some time. In fact it was from 02/11/2020 through 02/19/2020. During that time she was on antibiotic therapy by way of IV antibiotics. Her leg does appear to be doing better today compared to what we saw then. Nonetheless she had a culture that was obtained while she was in the hospital by Dr. Steva Ready. This ended up not growing anything which is not really surprising considering she was on antibiotics which were fairly strong at the time of culture. There was no growth at all after 2 days.  With that being said Dr. Steva Ready apparently discussed with Dr. Dellia Nims per her note anyway that she wanted the patient off of the antibiotics for a week and then to repeat a culture in the outpatient setting. She also has a suspicion for the possibility of pyoderma and has referred the patient to Dr. Nehemiah Massed and the patient is supposed to be seeing him sometime I believe Thursday of this week. Nonetheless I explained that pyoderma can sometimes begin as a injury or insult that then worsens. Obviously the initial injury here appears to have been a scraping of her leg that occurred when the patient was being transferred off of the table after a kyphoplasty procedure. Fortunately again as mentioned the infection seems to be doing significantly better based on what I am seeing at this point compared to previous pictures that I reviewed today. Electronic Signature(s) Signed: 02/23/2020 4:47:46 PM By: Worthy Keeler PA-C Entered By: Worthy Keeler on 02/23/2020 16:47:46 [name], [name] ([id number]) -------------------------------------------------------------------------------- Physical Exam Details [name], [name]. Date of Service: 02/23/2020 3:00 PM Medical Record Number:  [id number] Patient Account Number: [id number] Date of Birth/Sex: 1925/12/20 (86 y.o. F) Treating RN: Army Melia Primary Care Provider: Derinda Late Other Clinician: Referring Provider: BABAOFF, MARCUS Treating Provider/Extender: STONE III, Eaven Schwager Weeks in Treatment: 3 Constitutional Well-nourished and well-hydrated in no acute distress. Respiratory normal breathing without difficulty. Psychiatric this patient is able to make decisions and demonstrates good insight into disease process. Alert and Oriented x 3. pleasant and cooperative. Notes Patient's wound did have signs of slough noted on the surface of the wound there was also some hyper granular type tissue noted as well. Unfortunately I believe that  this does need to be cleaned up to some degree. With that being said right now we will not to perform any debridement today as the patient states it still hurts her quite significantly. I am going to suggest switching to [facility] over the next 2 days and we will see how things do until she follows up with Dr. Dellia Nims on Wednesday. I do believe I would recommend continuing with that appointment and then we can see at that point since she will be about a week out from being off of the antibiotics if he feels like repeating the culture is appropriate based on his conversation with Dr. Steva Ready. Electronic Signature(s) Signed: 02/23/2020 4:48:37 PM By: Worthy Keeler PA-C Entered By: Worthy Keeler on 02/23/2020 16:48:36 Seiden, [name] ([id number]) -------------------------------------------------------------------------------- Physician Orders Details [name] Date of Service: 02/23/2020 3:00 PM Medical Record Number: [id number] Patient Account Number: [id number] Date of Birth/Sex: 1925/12/01 (86 y.o. F) Treating RN: Army Melia Primary Care Provider: BABAOFF, MARCUS Other Clinician: Referring Provider: BABAOFF, MARCUS Treating Provider/Extender: Melburn Hake, Trevione Wert Weeks in Treatment: 3 Verbal / Phone Orders: No Diagnosis Coding ICD-10 Coding Code Description S81.811D Laceration without foreign body, right lower leg, subsequent encounter L97.812 Non-pressure chronic ulcer of other part of right lower leg with fat layer exposed I87.321 Chronic venous hypertension (idiopathic) with inflammation of right lower extremity Wound Cleansing Wound #3 Right,Lateral Lower Leg o Cleanse wound with mild soap and water Anesthetic (add to Medication List) Wound #3 Right,Lateral Lower Leg o Topical Lidocaine 4% cream applied to wound bed prior to debridement (In Clinic Only). Primary Wound Dressing o Hydrafera Blue Ready Transfer Secondary Dressing Wound #3  Right,Lateral Lower Leg o ABD pad o Kerlix and [place] Dressing Change Frequency Wound #3 Right,Lateral Lower Leg o Change Dressing Monday, Wednesday, Friday - Wednesday in clinic Monday and Friday by [facility] Follow-up Appointments Wound #3 Right,Lateral Lower Leg o Return Appointment in 1 week. - Pt coming this Wednesday for appointment Additional Orders / Instructions Wound #3 Right,Lateral Lower Leg o Other: - Go to the Emergency Department for treatment of infection Home Health Wound #3 Right,Lateral Lower Leg o [place] Visits - [place] Nurse may visit PRN to address patientos wound care needs. o FACE TO FACE ENCOUNTER: MEDICARE and MEDICAID PATIENTS: I certify that this patient is under my care and that I had a face-to-face encounter that meets the physician face-to-face encounter requirements with this patient on this date. The encounter with the patient was in whole or in part for the following MEDICAL CONDITION: (primary reason for [place]) MEDICAL NECESSITY: I certify, that based on my findings, NURSING services are a medically necessary home health service. HOME BOUND STATUS: I certify that my clinical findings support that this patient is homebound (i.e., Due to illness or injury, pt requires aid of supportive devices such as crutches,  cane, wheelchairs, walkers, the use of special transportation or the assistance of another person to leave their place of residence. There is a normal inability to leave the home and doing so requires considerable and taxing effort. Other absences are for medical reasons / religious services and are infrequent or of short duration when for other reasons). o If current dressing causes regression in wound condition, may D/C ordered dressing product/s and apply Normal Saline Moist Dressing daily until next [place] / Other MD appointment. [place] of regression in wound  condition at [phone]. o Please direct any NON-WOUND related issues/requests for orders to patient's Primary Care Physician [name], [name] ([id number]) Electronic Signature(s) Signed: 02/24/2020 2:30:00 PM By: Army Melia Signed: 02/24/2020 6:00:40 PM By: Worthy Keeler PA-C Previous Signature: 02/23/2020 5:05:18 PM Version By: Worthy Keeler PA-C Previous Signature: 02/23/2020 4:19:51 PM Version By: Army Melia Entered By: Army Melia on 02/24/2020 09:58:08 [name], [name] ([id number]) -------------------------------------------------------------------------------- Problem List Details [name], [name] C. Date of Service: 02/23/2020 3:00 PM Medical Record Number: [id number] Patient Account Number: [id number] Date of Birth/Sex: 1926/01/02 (86 y.o. F) Treating RN: Army Melia Primary Care Provider: Derinda Late Other Clinician: Referring Provider: BABAOFF, MARCUS Treating Provider/Extender: Melburn Hake, Josph Norfleet Weeks in Treatment: 3 Active Problems ICD-10 Encounter Code Description Active Date MDM Diagnosis S81.811D Laceration without foreign body, right lower leg, subsequent encounter 01/28/2020 No Yes L97.812 Non-pressure chronic ulcer of other part of right lower leg with fat layer 01/28/2020 No Yes exposed I87.321 Chronic venous hypertension (idiopathic) with inflammation of right 01/28/2020 No Yes lower extremity Inactive Problems Resolved Problems Electronic Signature(s) Signed: 02/23/2020 3:05:00 PM By: Worthy Keeler PA-C Entered By: Worthy Keeler on 02/23/2020 15:05:00 [name] ([id number]) -------------------------------------------------------------------------------- Progress Note Details [name]. Date of Service: 02/23/2020 3:00 PM Medical Record Number: [id number] Patient Account Number: [id number] Date of Birth/Sex: 21-Dec-1925 (86 y.o. F) Treating RN: Army Melia Primary Care Provider: Derinda Late Other  Clinician: Referring Provider: BABAOFF, MARCUS Treating Provider/Extender: Melburn Hake, Diamante Truszkowski Weeks in Treatment: 3 Subjective Chief Complaint Information obtained from Patient Left anterior shin ulcer 01/28/2020; patient is here for review of wound on her right anterior lower shin History of Present Illness (HPI) 06/06/17 on evaluation today patient presents for evaluation concerning an ulcer which she initially had arise as a result of striking her left lower extremity money bed frame. With that being said this was roughly one month ago and unfortunately the wound despite two courses of Keflex have not really made a dramatic improvement. This has continued to drain as well as being exquisitely tender at times. Even to the point that she is having a difficult time walking. Patient does have chronic atrial fibrillation which subsequently leads to her being on long-term anticoagulant therapy and this causes ED bruising which may have contributed to this entry and where things are at this point. She does have valvular heart disease as well as hypertension and her last INR was 2.7. Currently Neosporin, Gauls, and an ace wrap has been utilized. Keflex is the antibiotic that she has been on. Patient has not had a culture up to this point and she states that her blood pressure at the primary care office yesterday was 138/72. Her pain is significant related to be an eight out of 10. No fevers, chills, nausea, or vomiting noted at this time. 06/20/17; patient was admitted here 2 weeks ago. She had a traumatic wound on her left lateral  lower leg probably in the setting of some degree of venous insufficiency with surrounding cellulitis. She had been on Keflex, after we saw her she was put on doxycycline which she apparently did not tolerate. Culture that was done at the time showed Morganella which was resistant to Keflex. Apparently the patient continued to take Keflex after the appointment. She is also on  Coumadin. I had tried to change her antibiotic when the Morganella culture was brought to my attention however apparently our staff could not reach the patient to inform them of the antibiotic change 06/26/17; patient has wound on her left lateral lower extremity in the setting of some degree of venous insufficiency. I gave her cefdinir last week and she is completing this. There is no evidence of surrounding infection. Aggressive debridement last week, wound bed looks healthier. We've been using Aquacel. They're traveling in the [place] next week we'll see her back in 2 weeks unless there are problems. 07/10/17; the patient is been using Aquacel Ag her husband is changing this with Kerlix and conform. She still complains of a lot of pain. Apparently with the antibiotics. We gave 2 or 3 weeks ago. Her INR went up to 6, this was managed by her primary physician 07/17/17; she is using Aquacel Ag and a border foam. Still discomfort although dimensions are better. 07/24/17; patient or for review of a trauma wound on her left anterior leg in the setting of some degree of chronic venous insufficiency she has been using Aquacel Ag and a border foam and making progress. 07/31/17; only a small open area of this original significant trauma is still open. Her husband is changing this every second day [Aquacel Ag] Readmission: 01/14/19 on evaluation today patient is that she seen for initial inspection during the office visit today concerning a trauma/skin tear to the left anterior lower extremity. This is roughly the same region where she was previously treated and years past when she was here in the clinic. Nonetheless upon evaluation today the patient's wound actually showed signs of decent granulation around the edge although unfortunately the skin flap that was torn back did fold under on itself and the flap itself is dying. She does have some eschar surrounded the edges of the wound at this point. Nonetheless  this also was very tender for her. The patient does have a history of hypertension, atrial fibrillation, and long-term use anticoagulant therapy. 01/21/19 on evaluation today patient's wound on the anterior portion of her lower extremity appears to still be necrotic as far as the surface of the wound is concerned. Unfortunately there is no significant improvement at this point compared to last week they were not able to get the Santyl that are using Medihoney and maybe one reason why. The other is I think this may be stained to drive. Possibly adding something such as mepitel over top of the Medihoney may help to loosen this up quite a bit which I think would be in her best interest. Also she still has a lot of erythema and is very tender to touch I'm afraid that we may need to switch to a different antibiotic to see if this will be of benefit there still really nothing that I can culture to get a better picture of what's going on and what we need to do from the standpoint of antibiotics. I do believe the patient needs a referral Talmuds me to vascular in order to evaluate her blood flow based on what I'm seeing they will  be able to perform TBI testing if nothing else along with the vascular evaluation to see if there's any evidence for limited blood flow to this lower extremity. 4/8; I have reviewed the patient's arterial studies and they are really quite good. There should be no arterial impediments to healing the wound on the right anterior tibial area. 4/15; patient has a small open area over the left mid tibial area. Still requiring debridement we are using Santyl here and Medihoney at home [Santyl unaffordable] 4/22; difficult, presumably traumatic area over the left mid tibial area in the setting of chronic venous insufficiency. We have been using Santyl here and meta honey at home. The wound surface is getting gradually better and slight improvement in dimensions. Her husband is changing  this daily 4/29; left mid tibia presumably traumatic wound in the setting of chronic venous insufficiency. We have been using Santyl and a combination of Medihoney at home. Arrives today with a much better looking healthy granulated surface. We will change the primary dressing to [facility] 5/6; left mid tibia smaller reasonably healthy looking wound using Hydrofera Blue since last week 5/20-Patient returns to clinic with a left mid tibial wound looking better, we have been using Hydrofera Blue since the previous week 5/27; patient returns to clinic with a left mid tibial wound covered in raised eschar. We have been using Hydrofera Blue her husband changes the dressings using a border foam cover READMISSION 01/28/20 [name] is a patient who is now 86 years old. As usual she comes in with her husband who is her primary caregiver. They were here last from March through May 2020 with a traumatic wound on the left anterior tibia we eventually heal this out. She has chronic venous insufficiency. She is also here previously in 2018. SHAQUOIA, COHAN ([id number]) Her current problem started 8 days ago. She went in the hospital for a thoracic level kyphoplasty. The procedure she developed a skin tear on her leg. This was mentioned to her. Her husband is applying [employer] to the area and they are in for our review of this. She finds this much too uncomfortable to attempt ABIs although her ABIs when she was in the clinic last year were quite normal bilaterally Past medical history she is not a diabetic. As noted she had a T9 and T6 kyphoplasty, Alzheimer's disease, chronic atrial fibrillation on Coumadin and hypertension 4/14; patient arrives in clinic having removed part of the wrap. We attempted to leave this on all week. Unfortunately none that none of the tissue that looks semiviable last week has remained viable. She required an extensive debridement. We have been using silver alginate 4/21;  the patient arrived today with tremendous erythema around the large part of the wound on the right lateral leg. This extends medially and down towards her foot. I marked the area but I think the extent of the likely cellulitis is beyond what can be safely managed as an outpatient. 02/23/2020 upon evaluation today patient is seen today though she is typically followed by Dr. Dellia Nims. Last time he saw her he sent her to the hospital and she subsequently was in the hospital for some time. In fact it was from 02/11/2020 through 02/19/2020. During that time she was on antibiotic therapy by way of IV antibiotics. Her leg does appear to be doing better today compared to what we saw then. Nonetheless she had a culture that was obtained while she was in the hospital by Dr. Steva Ready. This ended up  not growing anything which is not really surprising considering she was on antibiotics which were fairly strong at the time of culture. There was no growth at all after 2 days. With that being said Dr. Steva Ready apparently discussed with Dr. Dellia Nims per her note anyway that she wanted the patient off of the antibiotics for a week and then to repeat a culture in the outpatient setting. She also has a suspicion for the possibility of pyoderma and has referred the patient to Dr. Nehemiah Massed and the patient is supposed to be seeing him sometime I believe Thursday of this week. Nonetheless I explained that pyoderma can sometimes begin as a injury or insult that then worsens. Obviously the initial injury here appears to have been a scraping of her leg that occurred when the patient was being transferred off of the table after a kyphoplasty procedure. Fortunately again as mentioned the infection seems to be doing significantly better based on what I am seeing at this point compared to previous pictures that I reviewed today. Objective Constitutional Well-nourished and well-hydrated in no acute distress. Vitals Time Taken: 3:10  PM, Height: 62 in, Weight: 98 lbs, BMI: 17.9, Temperature: 98.3 F, Pulse: 87 bpm, Respiratory Rate: 16 breaths/min, Blood Pressure: 158/97 mmHg. Respiratory normal breathing without difficulty. Psychiatric this patient is able to make decisions and demonstrates good insight into disease process. Alert and Oriented x 3. pleasant and cooperative. General Notes: Patient's wound did have signs of slough noted on the surface of the wound there was also some hyper granular type tissue noted as well. Unfortunately I believe that this does need to be cleaned up to some degree. With that being said right now we will not to perform any debridement today as the patient states it still hurts her quite significantly. I am going to suggest switching to [facility] over the next 2 days and we will see how things do until she follows up with Dr. Dellia Nims on Wednesday. I do believe I would recommend continuing with that appointment and then we can see at that point since she will be about a week out from being off of the antibiotics if he feels like repeating the culture is appropriate based on his conversation with Dr. Steva Ready. Integumentary (Hair, Skin) Wound #3 status is Open. Original cause of wound was Trauma. The wound is located on the Right,Lateral Lower Leg. The wound measures 7.3cm length x 4.5cm width x 0.2cm depth; 25.8cm^2 area and 5.16cm^3 volume. There is Fat Layer (Subcutaneous Tissue) Exposed exposed. There is no tunneling or undermining noted. There is a medium amount of purulent drainage noted. The wound margin is flat and intact. There is medium (34-66%) pink granulation within the wound bed. There is a medium (34-66%) amount of necrotic tissue within the wound bed including Adherent Slough. Assessment Active Problems ICD-10 Laceration without foreign body, right lower leg, subsequent encounter Non-pressure chronic ulcer of other part of right lower leg with fat layer exposed Chronic  venous hypertension (idiopathic) with inflammation of right lower extremity [name], [name] ([id number]) Plan Wound Cleansing: Wound #3 Right,Lateral Lower Leg: Cleanse wound with mild soap and water Anesthetic (add to Medication List): Wound #3 Right,Lateral Lower Leg: Topical Lidocaine 4% cream applied to wound bed prior to debridement (In Clinic Only). Primary Wound Dressing: Hydrafera Blue Ready Transfer Secondary Dressing: Wound #3 Right,Lateral Lower Leg: ABD pad Kerlix and Coban Dressing Change Frequency: Wound #3 Right,Lateral Lower Leg: Change Dressing Monday, Wednesday, Friday - Wednesday in clinic Monday and  Friday by [facility] Follow-up Appointments: Wound #3 Right,Lateral Lower Leg: Return Appointment in 1 week. - Pt coming this Wednesday for appointment Additional Orders / Instructions: Wound #3 Right,Lateral Lower Leg: Other: - Go to the Emergency Department for treatment of infection 1. My suggestion currently is good to be that we go ahead and initiate treatment with [facility] as I think this will be a good option for the patient. 2. I am good also recommend that we go ahead and continue with the Curlex and Coban wrap that was previously utilized by Dr. Dellia Nims the patient states that this is tight enough and she is concerned about it being any tighter. She does have dementia but nonetheless was very adamant in this regard. 3. With regard to infection the patient's wound actually appears to be doing significantly better which is great news. My suggestion at this point would be that we go ahead and have the patient follow-up with Dr. Dellia Nims on Wednesday and see if there is anything he would like to do as far as culture is concerned based on his conversation with Dr. Steva Ready. We will see patient back for reevaluation in 2 days here in the clinic with Dr. Dellia Nims. If anything worsens or changes patient will contact our office for additional recommendations. Electronic  Signature(s) Signed: 02/23/2020 4:52:48 PM By: Worthy Keeler PA-C Entered By: Worthy Keeler on 02/23/2020 16:52:48 [name] ([id number]) -------------------------------------------------------------------------------- SuperBill Details [name] Date of Service: 02/23/2020 Medical Record Number: [id number] Patient Account Number: [id number] Date of Birth/Sex: June 01, 1926 (86 y.o. F) Treating RN: Army Melia Primary Care Provider: BABAOFF, MARCUS Other Clinician: Referring Provider: BABAOFF, MARCUS Treating Provider/Extender: Melburn Hake, Ryland Tungate Weeks in Treatment: 3 Diagnosis Coding ICD-10 Codes Code Description S81.811D Laceration without foreign body, right lower leg, subsequent encounter L97.812 Non-pressure chronic ulcer of other part of right lower leg with fat layer exposed I87.321 Chronic venous hypertension (idiopathic) with inflammation of right lower extremity Facility Procedures CPT4 Code: [id number] Description: 99213 - WOUND CARE VISIT-LEV 3 EST PT Modifier: Quantity: 1 Physician Procedures CPT4 Code: [id number] Description: [id number] - WC PHYS LEVEL 3 - EST PT Modifier: Quantity: 1 CPT4 Code: Description: ICD-10 Diagnosis Description S81.811D Laceration without foreign body, right lower leg, subsequent encounter [id number] Non-pressure chronic ulcer of other part of right lower leg with fat l I87.321 Chronic venous hypertension (idiopathic) with  inflammation of right lo Modifier: ayer exposed wer extremity Quantity: Electronic Signature(s) Signed: 02/23/2020 4:52:58 PM By: Worthy Keeler PA-C Entered By: Worthy Keeler on 02/23/2020 16:52:58

## 2020-02-25 ENCOUNTER — Other Ambulatory Visit: Payer: Self-pay

## 2020-02-25 ENCOUNTER — Encounter: Payer: Medicare Other | Admitting: Internal Medicine

## 2020-02-25 DIAGNOSIS — S81811D Laceration without foreign body, right lower leg, subsequent encounter: Secondary | ICD-10-CM | POA: Diagnosis not present

## 2020-02-26 ENCOUNTER — Ambulatory Visit: Payer: Medicare Other | Admitting: Dermatology

## 2020-02-26 NOTE — Progress Notes (Signed)
[name], [name] ([id number]) Visit Report for 02/25/2020 Arrival Information Details [name], [name]. Date of Service: 02/25/2020 1:30 PM Medical Record Number: [id number] Patient Account Number: [id number] Date of Birth/Sex: 1926-05-22 (86 y.o. F) Treating RN: [name] Primary Care [name]: [name] Other Clinician: Referring [name]: [name] Treating [name]/Extender: [name] in Treatment: 4 Visit Information History Since Last Visit Added or deleted any medications: No Patient Arrived: Ambulatory Any new allergies or adverse reactions: No Arrival Time: 13:45 Had a fall or experienced change in No Accompanied By: husband activities of daily living that may affect Transfer Assistance: None risk of falls: Patient Identification Verified: Yes Signs or symptoms of abuse/neglect since last visito No Secondary Verification Process Completed: Yes Hospitalized since last visit: No Patient Has Alerts: Yes Implantable device outside of the clinic excluding No Patient Alerts: Patient on Blood Thinner cellular tissue based products placed in the center warfarin since last visit: Has Dressing in Place as Prescribed: Yes Pain Present Now: Yes Electronic Signature(s) Signed: 02/25/2020 4:20:30 PM By: [name] [name] Entered By: [name] on 02/25/2020 13:45:55 [name], [name] ([id number]) -------------------------------------------------------------------------------- Clinic Level of Care Assessment Details [name]. Date of Service: 02/25/2020 1:30 PM Medical Record Number: [id number] Patient Account Number: [id number] Date of Birth/Sex: 1926-01-12 (86 y.o. F) Treating RN: [name] Primary Care Tenya Araque: [name] Other Clinician: Referring [name]: [name] Treating [name]/Extender: [name] in Treatment: 4 Clinic Level of Care  Assessment Items TOOL 4 Quantity Score []  - Use when only an EandM is performed on FOLLOW-UP visit 0 ASSESSMENTS - Nursing Assessment / Reassessment X - Reassessment of Co-morbidities (includes updates in patient status) 1 10 X- 1 5 Reassessment of Adherence to Treatment Plan ASSESSMENTS - Wound and Skin Assessment / Reassessment X - Simple Wound Assessment / Reassessment - one wound 1 5 []  - 0 Complex Wound Assessment / Reassessment - multiple wounds []  - 0 Dermatologic / Skin Assessment (not related to wound area) ASSESSMENTS - Focused Assessment []  - Circumferential Edema Measurements - multi extremities 0 []  - 0 Nutritional Assessment / Counseling / Intervention []  - 0 Lower Extremity Assessment (monofilament, tuning fork, pulses) []  - 0 Peripheral Arterial Disease Assessment (using hand held doppler) ASSESSMENTS - Ostomy and/or Continence Assessment and Care []  - Incontinence Assessment and Management 0 []  - 0 Ostomy Care Assessment and Management (repouching, etc.) PROCESS - Coordination of Care X - Simple Patient / Family Education for ongoing care 1 15 []  - 0 Complex (extensive) Patient / Family Education for ongoing care X- 1 10 Staff obtains Programmer, systems, Records, Test Results / Process Orders []  - 0 Staff telephones HHA, Nursing Homes / Clarify orders / etc []  - 0 Routine Transfer to another Facility (non-emergent condition) []  - 0 Routine Hospital Admission (non-emergent condition) []  - 0 New Admissions / Biomedical engineer / Ordering NPWT, Apligraf, etc. []  - 0 Emergency Hospital Admission (emergent condition) X- 1 10 Simple Discharge Coordination []  - 0 Complex (extensive) Discharge Coordination PROCESS - Special Needs []  - Pediatric / Minor Patient Management 0 []  - 0 Isolation Patient Management []  - 0 Hearing / Language / Visual special needs []  - 0 Assessment of Community assistance (transportation, D/C planning, etc.) []  - 0 Additional  assistance / Altered mentation []  - 0 Support Surface(s) Assessment (bed, cushion, seat, etc.) INTERVENTIONS - Wound Cleansing / Measurement [name], [name] ([id number]) X- 1 5 Simple Wound Cleansing - one  wound []  - 0 Complex Wound Cleansing - multiple wounds X- 1 5 Wound Imaging (photographs - any number of wounds) []  - 0 Wound Tracing (instead of photographs) []  - 0 Simple Wound Measurement - one wound []  - 0 Complex Wound Measurement - multiple wounds INTERVENTIONS - Wound Dressings []  - Small Wound Dressing one or multiple wounds 0 []  - 0 Medium Wound Dressing one or multiple wounds X- 1 20 Large Wound Dressing one or multiple wounds []  - 0 Application of Medications - topical []  - 0 Application of Medications - injection INTERVENTIONS - Miscellaneous []  - External ear exam 0 []  - 0 Specimen Collection (cultures, biopsies, blood, body fluids, etc.) []  - 0 Specimen(s) / Culture(s) sent or taken to Lab for analysis []  - 0 Patient Transfer (multiple staff / Civil Service fast streamer / Similar devices) []  - 0 Simple Staple / Suture removal (25 or less) []  - 0 Complex Staple / Suture removal (26 or more) []  - 0 Hypo / Hyperglycemic Management (close monitor of Blood Glucose) []  - 0 Ankle / Brachial Index (ABI) - do not check if billed separately X- 1 5 Vital Signs Has the patient been seen at the hospital within the last three years: Yes Total Score: 90 Level Of Care: New/Established - Level 3 Electronic Signature(s) Signed: 02/25/2020 5:22:43 PM By: [name], BSN, RN, [name], Kim RN, BSN Entered By: [name], [name] on 02/25/2020 14:13:44 [name] ([id number]) -------------------------------------------------------------------------------- Encounter Discharge Information Details [name]. Date of Service: 02/25/2020 1:30 PM Medical Record Number: [id number] Patient Account Number: [id number] Date of Birth/Sex: 1926/01/09 (86 y.o. F) Treating  RN: [name] Primary Care Kamauri Kathol: [name] Other Clinician: Referring Katriona Schmierer: [name] Treating Christiano Blandon/Extender: [name] in Treatment: 4 Encounter Discharge Information Items Discharge Condition: Stable Ambulatory Status: Ambulatory Discharge Destination: Home Transportation: Private Auto Accompanied By: husband Schedule Follow-up Appointment: Yes Clinical Summary of Care: Electronic Signature(s) Signed: 02/25/2020 5:22:43 PM By: [name], BSN, RN, [name], Kim RN, BSN Entered By: [name], [name] on 02/25/2020 14:17:15 [name] ([id number]) -------------------------------------------------------------------------------- Lower Extremity Assessment Details [name], [name]. Date of Service: 02/25/2020 1:30 PM Medical Record Number: [id number] Patient Account Number: [id number] Date of Birth/Sex: January 13, 1926 (86 y.o. F) Treating RN: Montey Hora Primary Care Yuvia Plant: [name] Other Clinician: Referring Jhonatan Lomeli: [name] Treating Antavia Tandy/Extender: Ricard Dillon Weeks in Treatment: 4 Edema Assessment Assessed: [Left: No] [Right: No] Edema: [Left: N] [Right: o] Calf Left: Right: Point of Measurement: 30 cm From Medial Instep cm 28.5 cm Ankle Left: Right: Point of Measurement: 10 cm From Medial Instep cm 20 cm Vascular Assessment Pulses: Dorsalis Pedis Palpable: [Right:Yes] Electronic Signature(s) Signed: 02/25/2020 4:27:51 PM By: Montey Hora Entered By: Montey Hora on 02/25/2020 13:52:31 [name], [name] ([id number]) -------------------------------------------------------------------------------- Multi Wound Chart Details [name]. Date of Service: 02/25/2020 1:30 PM Medical Record Number: [id number] Patient Account Number: [id number] Date of Birth/Sex: 15-Aug-1926 (86 y.o. F) Treating RN: [name] Primary Care Ashawn Rinehart: [name] Other Clinician: Referring  Myca Perno: [name] Treating Tkeya Stencil/Extender: Ricard Dillon Weeks in Treatment: 4 Vital Signs Height(in): 62 Pulse(bpm): 19 Weight(lbs): 77 Blood Pressure(mmHg): 145/70 Body Mass Index(BMI): 18 Temperature(F): 97.8 Respiratory Rate(breaths/min): 16 Photos: [N/A:N/A] Wound Location: Right, Lateral Lower Leg N/A N/A Wounding Event: Trauma N/A N/A Primary Etiology: Venous Leg Ulcer N/A N/A Secondary Etiology: Trauma, Other N/A N/A Comorbid History: Cataracts, Deep Vein Thrombosis, N/A N/A Hypertension, Osteoarthritis, Dementia Date Acquired: 01/20/2020  N/A N/A Weeks of Treatment: 4 N/A N/A Wound Status: Open N/A N/A Measurements L x W x D (cm) 6.5x4.5x0.2 N/A N/A Area (cm) : 22.973 N/A N/A Volume (cm) : 4.595 N/A N/A % Reduction in Area: -62.50% N/A N/A % Reduction in Volume: -62.50% N/A N/A Classification: Full Thickness Without Exposed N/A N/A Support Structures Exudate Amount: Medium N/A N/A Exudate Type: Purulent N/A N/A Exudate Color: yellow, brown, green N/A N/A Wound Margin: Flat and Intact N/A N/A Granulation Amount: Small (1-33%) N/A N/A Granulation Quality: Pink N/A N/A Necrotic Amount: Large (67-100%) N/A N/A Exposed Structures: Fat Layer (Subcutaneous Tissue) N/A N/A Exposed: Yes Fascia: No Tendon: No Muscle: No Joint: No Bone: No Epithelialization: None N/A N/A Treatment Notes Electronic Signature(s) Signed: 02/25/2020 4:28:02 PM By: Linton Ham MD Entered By: Linton Ham on 02/25/2020 14:15:26 [name] ([id number]) [name], [name] ([id number]) -------------------------------------------------------------------------------- [place] Details [name], [name]. Date of Service: 02/25/2020 1:30 PM Medical Record Number: [id number] Patient Account Number: [id number] Date of Birth/Sex: [birth year] (86 y.o. F) Treating RN: [name] Primary Care Merland Holness: [name] Other  Clinician: Referring Abbye Lao: [name] Treating Reka Wist/Extender: [name] in Treatment: 4 Active Inactive Necrotic Tissue Nursing Diagnoses: Impaired tissue integrity related to necrotic/devitalized tissue Goals: Necrotic/devitalized tissue will be minimized in the wound bed Date Initiated: 01/28/2020 Target Resolution Date: 02/04/2020 Goal Status: Active Interventions: Assess patient pain level pre-, during and post procedure and prior to discharge Treatment Activities: Apply topical anesthetic as ordered : 01/28/2020 Notes: Orientation to the Wound Care Program Nursing Diagnoses: Knowledge deficit related to the wound healing center program Goals: Patient/caregiver will verbalize understanding of the [place] Date Initiated: 01/28/2020 Target Resolution Date: 02/04/2020 Goal Status: Active Interventions: Provide education on orientation to the wound center Notes: Soft Tissue Infection Nursing Diagnoses: Impaired tissue integrity Goals: Patient will remain free of wound infection Date Initiated: 01/28/2020 Target Resolution Date: 02/11/2020 Goal Status: Active Interventions: Assess signs and symptoms of infection every visit Notes: Wound/Skin Impairment Nursing Diagnoses: Impaired tissue integrity Goals: Ulcer/skin breakdown will have a volume reduction of 30% by week 4 [name], [name] ([id number]) Date Initiated: 01/28/2020 Target Resolution Date: 02/27/2020 Goal Status: Active Interventions: Assess ulceration(s) every visit Treatment Activities: Skin care regimen initiated : 01/28/2020 Topical wound management initiated : 01/28/2020 Notes: Electronic Signature(s) Signed: 02/25/2020 5:22:43 PM By: [name], BSN, RN, [name], Kim RN, BSN Entered By: [name], [name] on 02/25/2020 14:05:44 [name] ([id number]) -------------------------------------------------------------------------------- Pain Assessment  Details [name]. Date of Service: 02/25/2020 1:30 PM Medical Record Number: [id number] Patient Account Number: [id number] Date of Birth/Sex: May 23, 1926 (86 y.o. F) Treating RN: Montey Hora Primary Care Jumar Greenstreet: [name] Other Clinician: Referring Zyriah Mask: [name] Treating Shirline Kendle/Extender: Ricard Dillon Weeks in Treatment: 4 Active Problems Location of Pain Severity and Description of Pain Patient Has Paino Yes Site Locations Pain Location: Pain in Ulcers With Dressing Change: Yes Duration of the Pain. Constant / Intermittento Constant Pain Management and Medication Current Pain Management: Electronic Signature(s) Signed: 02/25/2020 4:27:51 PM By: Montey Hora Entered By: Montey Hora on 02/25/2020 13:50:39 [name], [name] ([id number]) -------------------------------------------------------------------------------- Patient/Caregiver Education Details [name] Date of Service: 02/25/2020 1:30 PM Medical Record Number: [id number] Patient Account Number: [id number] Date of Birth/Gender: [birth year] (86 y.o. F) Treating RN: [name] Primary Care Physician: [name] Other Clinician: Referring Physician: BABAOFF, [name] Treating Physician/Extender: [name] in Treatment: 4 Education Assessment  Education Provided To: Patient Education Topics Provided Welcome To [facility]: Handouts: Welcome To [facility] Methods: Demonstration, Explain/Verbal Responses: State content correctly Wound/Skin Impairment: Handouts: Caring for Your Ulcer Methods: Demonstration, Explain/Verbal Responses: State content correctly Electronic Signature(s) Signed: 02/25/2020 5:22:43 PM By: [name], BSN, RN, [name], Kim RN, BSN Entered By: [name], [name] on 02/25/2020 14:16:24 [name]  ([id number]) -------------------------------------------------------------------------------- Wound Assessment Details [name], [name]. Date of Service: 02/25/2020 1:30 PM Medical Record Number: [id number] Patient Account Number: [id number] Date of Birth/Sex: June 18, 1926 (86 y.o. F) Treating RN: Montey Hora Primary Care Jemarion Roycroft: [name] Other Clinician: Referring Deny Chevez: [name] Treating Takiesha Mcdevitt/Extender: Ricard Dillon Weeks in Treatment: 4 Wound Status Wound Number: 3 Primary Venous Leg Ulcer Etiology: Wound Location: Right, Lateral Lower Leg Secondary Trauma, Other Wounding Event: Trauma Etiology: Date Acquired: 01/20/2020 Wound Status: Open Weeks Of Treatment: 4 Comorbid Cataracts, Deep Vein Thrombosis, Hypertension, Clustered Wound: No History: Osteoarthritis, Dementia Photos Wound Measurements Length: (cm) 6.5 Width: (cm) 4.5 Depth: (cm) 0.2 Area: (cm) 22.973 Volume: (cm) 4.595 % Reduction in Area: -62.5% % Reduction in Volume: -62.5% Epithelialization: None Wound Description Classification: Full Thickness Without Exposed Support Structu Wound Margin: Flat and Intact Exudate Amount: Medium Exudate Type: Purulent Exudate Color: yellow, brown, green res Foul Odor After Cleansing: No Slough/Fibrino Yes Wound Bed Granulation Amount: Small (1-33%) Exposed Structure Granulation Quality: Pink Fascia Exposed: No Necrotic Amount: Large (67-100%) Fat Layer (Subcutaneous Tissue) Exposed: Yes Necrotic Quality: Adherent Slough Tendon Exposed: No Muscle Exposed: No Joint Exposed: No Bone Exposed: No Treatment Notes Wound #3 (Right, Lateral Lower Leg) Notes hblue, ABD kerlix, coban Electronic Signature(s) [name], [name] ([id number]) Signed: 02/25/2020 4:27:51 PM By: Montey Hora Entered By: Montey Hora on 02/25/2020 13:52:57 [name], [name]  ([id number]) -------------------------------------------------------------------------------- Vitals Details [name]. Date of Service: 02/25/2020 1:30 PM Medical Record Number: [id number] Patient Account Number: [id number] Date of Birth/Sex: [birth year] (86 y.o. F) Treating RN: [name] Primary Care Charnise Lovan: [name] Other Clinician: Referring Aarion Kittrell: [name] Treating Jessa Stinson/Extender: Ricard Dillon Weeks in Treatment: 4 Vital Signs Time Taken: 13:40 Temperature (F): 97.8 Height (in): 62 Pulse (bpm): 76 Weight (lbs): 98 Respiratory Rate (breaths/min): 16 Body Mass Index (BMI): 17.9 Blood Pressure (mmHg): 145/70 Reference Range: 80 - 120 mg / dl Electronic Signature(s) Signed: 02/25/2020 4:20:30 PM By: [name] [name] Entered By: [name] on 02/25/2020 13:46:26

## 2020-02-26 NOTE — Progress Notes (Signed)
ADRIAHNA, RASSO ([id number]) Visit Report for 02/25/2020 HPI Details [name], [name]. Date of Service: 02/25/2020 1:30 PM Medical Record Number: [id number] Patient Account Number: [id number] Date of Birth/Sex: December 08, 1925 (86 y.o. F) Treating RN: Cornell Barman Primary Care Provider: Derinda Late Other Clinician: Referring Provider: BABAOFF, MARCUS Treating Provider/Extender: Tito Dine in Treatment: 4 History of Present Illness HPI Description: 06/06/17 on evaluation today patient presents for evaluation concerning an ulcer which she initially had arise as a result of striking her left lower extremity money bed frame. With that being said this was roughly one month ago and unfortunately the wound despite two courses of Keflex have not really made a dramatic improvement. This has continued to drain as well as being exquisitely tender at times. Even to the point that she is having a difficult time walking. Patient does have chronic atrial fibrillation which subsequently leads to her being on long-term anticoagulant therapy and this causes ED bruising which may have contributed to this entry and where things are at this point. She does have valvular heart disease as well as hypertension and her last INR was 2.7. Currently Neosporin, Gauls, and an ace wrap has been utilized. Keflex is the antibiotic that she has been on. Patient has not had a culture up to this point and she states that her blood pressure at the primary care office yesterday was 138/72. Her pain is significant related to be an eight out of 10. No fevers, chills, nausea, or vomiting noted at this time. 06/20/17; patient was admitted here 2 weeks ago. She had a traumatic wound on her left lateral lower leg probably in the setting of some degree of venous insufficiency with surrounding cellulitis. She had been on Keflex, after we saw her she was put on doxycycline which she apparently did not tolerate. Culture  that was done at the time showed Morganella which was resistant to Keflex. Apparently the patient continued to take Keflex after the appointment. She is also on Coumadin. I had tried to change her antibiotic when the Morganella culture was brought to my attention however apparently our staff could not reach the patient to inform them of the antibiotic change 06/26/17; patient has wound on her left lateral lower extremity in the setting of some degree of venous insufficiency. I gave her cefdinir last week and she is completing this. There is no evidence of surrounding infection. Aggressive debridement last week, wound bed looks healthier. We've been using Aquacel. They're traveling in the [place] next week we'll see her back in 2 weeks unless there are problems. 07/10/17; the patient is been using Aquacel Ag her husband is changing this with Kerlix and conform. She still complains of a lot of pain. Apparently with the antibiotics. We gave 2 or 3 weeks ago. Her INR went up to 6, this was managed by her primary physician 07/17/17; she is using Aquacel Ag and a border foam. Still discomfort although dimensions are better. 07/24/17; patient or for review of a trauma wound on her left anterior leg in the setting of some degree of chronic venous insufficiency she has been using Aquacel Ag and a border foam and making progress. 07/31/17; only a small open area of this original significant trauma is still open. Her husband is changing this every second day [Aquacel Ag] Readmission: 01/14/19 on evaluation today patient is that she seen for initial inspection during the office visit today concerning a trauma/skin tear to the left anterior lower extremity. This is roughly  the same region where she was previously treated and years past when she was here in the clinic. Nonetheless upon evaluation today the patient's wound actually showed signs of decent granulation around the edge although unfortunately the skin flap  that was torn back did fold under on itself and the flap itself is dying. She does have some eschar surrounded the edges of the wound at this point. Nonetheless this also was very tender for her. The patient does have a history of hypertension, atrial fibrillation, and long-term use anticoagulant therapy. 01/21/19 on evaluation today patient's wound on the anterior portion of her lower extremity appears to still be necrotic as far as the surface of the wound is concerned. Unfortunately there is no significant improvement at this point compared to last week they were not able to get the Santyl that are using Medihoney and maybe one reason why. The other is I think this may be stained to drive. Possibly adding something such as mepitel over top of the Medihoney may help to loosen this up quite a bit which I think would be in her best interest. Also she still has a lot of erythema and is very tender to touch I'm afraid that we may need to switch to a different antibiotic to see if this will be of benefit there still really nothing that I can culture to get a better picture of what's going on and what we need to do from the standpoint of antibiotics. I do believe the patient needs a referral Talmuds me to vascular in order to evaluate her blood flow based on what I'm seeing they will be able to perform TBI testing if nothing else along with the vascular evaluation to see if there's any evidence for limited blood flow to this lower extremity. 4/8; I have reviewed the patient's arterial studies and they are really quite good. There should be no arterial impediments to healing the wound on the right anterior tibial area. 4/15; patient has a small open area over the left mid tibial area. Still requiring debridement we are using Santyl here and Medihoney at home [Santyl unaffordable] 4/22; difficult, presumably traumatic area over the left mid tibial area in the setting of chronic venous insufficiency. We have  been using Santyl here and meta honey at home. The wound surface is getting gradually better and slight improvement in dimensions. Her husband is changing this daily 4/29; left mid tibia presumably traumatic wound in the setting of chronic venous insufficiency. We have been using Santyl and a combination of Medihoney at home. Arrives today with a much better looking healthy granulated surface. We will change the primary dressing to [facility] 5/6; left mid tibia smaller reasonably healthy looking wound using Hydrofera Blue since last week 5/20-Patient returns to clinic with a left mid tibial wound looking better, we have been using Hydrofera Blue since the previous week 5/27; patient returns to clinic with a left mid tibial wound covered in raised eschar. We have been using Hydrofera Blue her husband changes the dressings using a border foam cover READMISSION 01/28/20 Mrs. Kubin is a patient who is now 86 years old. As usual she comes in with her husband who is her primary caregiver. They were here last from March through May 2020 with a traumatic wound on the left anterior tibia we eventually heal this out. She has chronic venous insufficiency. She is also here previously in 2018. ANABELA, CRAYTON ([id number]) Her current problem started 8 days ago. She went in  the hospital for a thoracic level kyphoplasty. The procedure she developed a skin tear on her leg. This was mentioned to her. Her husband is applying Medihoney to the area and they are in for our review of this. She finds this much too uncomfortable to attempt ABIs although her ABIs when she was in the clinic last year were quite normal bilaterally Past medical history she is not a diabetic. As noted she had a T9 and T6 kyphoplasty, Alzheimer's disease, chronic atrial fibrillation on Coumadin and hypertension 4/14; patient arrives in clinic having removed part of the wrap. We attempted to leave this on all week. Unfortunately none  that none of the tissue that looks semiviable last week has remained viable. She required an extensive debridement. We have been using silver alginate 4/21; the patient arrived today with tremendous erythema around the large part of the wound on the right lateral leg. This extends medially and down towards her foot. I marked the area but I think the extent of the likely cellulitis is beyond what can be safely managed as an outpatient. 02/23/2020 upon evaluation today patient is seen today though she is typically followed by Dr. Dellia Nims. Last time he saw her he sent her to the hospital and she subsequently was in the hospital for some time. In fact it was from 02/11/2020 through 02/19/2020. During that time she was on antibiotic therapy by way of IV antibiotics. Her leg does appear to be doing better today compared to what we saw then. Nonetheless she had a culture that was obtained while she was in the hospital by Dr. Steva Ready. This ended up not growing anything which is not really surprising considering she was on antibiotics which were fairly strong at the time of culture. There was no growth at all after 2 days. With that being said Dr. Steva Ready apparently discussed with Dr. Dellia Nims per her note anyway that she wanted the patient off of the antibiotics for a week and then to repeat a culture in the outpatient setting. She also has a suspicion for the possibility of pyoderma and has referred the patient to Dr. Nehemiah Massed and the patient is supposed to be seeing him sometime I believe Thursday of this week. Nonetheless I explained that pyoderma can sometimes begin as a injury or insult that then worsens. Obviously the initial injury here appears to have been a scraping of her leg that occurred when the patient was being transferred off of the table after a kyphoplasty procedure. Fortunately again as mentioned the infection seems to be doing significantly better based on what I am seeing at this point  compared to previous pictures that I reviewed today. 5/5; the patient is back to my clinic today after seeing with stone 2 days ago. She is on [employer] with a Kerlix Coban I have reviewed her recent history. We had sent her to the hospital with presumed cellulitis considerable erythema in the lateral leg. She received prolonged courses of antibiotics in the hospital. I received a call from one of the hospitalist to report that he didn't feel that she was responding to antibiotics although as noted 2 days ago by Jeri Cos the wound actually looks a lot better along with the periwound skin which is not nearly as erythematous. He raised the possibility of pyoderma gangrenosum which also had been brought up by Dr. Steva Ready the infectious disease consultant. The discharge culture was negative but she was still on antibiotics The pyoderma gangrenosum apparently has something to do  with the fact that both her sons have Crohn's disease although the patient does not have Crohn's disease. I told the doctor that call me I didn't really think this fit with the history which was a trauma induced wound why the patient was having a kyphoplasty. In fact her husband is still very angry about this with the hospital. She also has severe chronic venous insufficiency with hemosiderin and stasis dermatitis and has had wounds in both legs. Electronic Signature(s) Signed: 02/25/2020 4:28:02 PM By: Linton Ham MD Entered By: Linton Ham on 02/25/2020 14:18:32 [name] ([id number]) -------------------------------------------------------------------------------- Physical Exam Details Patient Name: HERA, DEXHEIMER. Date of Service: 02/25/2020 1:30 PM Medical Record Number: [id number] Patient Account Number: [id number] Date of Birth/Sex: [birth year] (86 y.o. F) Treating RN: Cornell Barman Primary Care Provider: Derinda Late Other Clinician: Referring Provider: BABAOFF, MARCUS Treating  Provider/Extender: Ricard Dillon Weeks in Treatment: 4 Constitutional Sitting or standing Blood Pressure is within target range for patient.. Pulse regular and within target range for patient.Marland Kitchen Respirations regular, non- labored and within target range.. Temperature is normal and within the target range for the patient.Marland Kitchen appears in no distress. Respiratory Respiratory effort is easy and symmetric bilaterally. Rate is normal at rest and on room air.. Cardiovascular Pedal pulses are palpable. We have good edema control. Integumentary (Hair, Skin) The degree of erythema around the wound and the lateral leg is quite a bit better.Marland Kitchen Psychiatric Same anxious demeanor associated with cognitive impairment. Notes Wound exam; the patient's right lateral periwound area actually looks a lot better. She has hemosiderin deposition and stasis dermatitis. Under illumination the surface of the wound is covered in a tightly adherent slough. It is very difficult to debride this so I elected to put this off to see if Hydrofera Blue to help loosen this up somewhat. Electronic Signature(s) Signed: 02/25/2020 4:28:02 PM By: Linton Ham MD Entered By: Linton Ham on 02/25/2020 14:20:49 [name] ([id number]) -------------------------------------------------------------------------------- Physician Orders Details Patient Name: [name] Date of Service: 02/25/2020 1:30 PM Medical Record Number: [id number] Patient Account Number: [id number] Date of Birth/Sex: 1926-08-15 (86 y.o. F) Treating RN: Cornell Barman Primary Care Provider: BABAOFF, MARCUS Other Clinician: Referring Provider: BABAOFF, MARCUS Treating Provider/Extender: Tito Dine in Treatment: 4 Verbal / Phone Orders: No Diagnosis Coding Wound Cleansing Wound #3 Right,Lateral Lower Leg o Cleanse wound with mild soap and water Anesthetic (add to Medication List) Wound #3 Right,Lateral Lower Leg o Topical  Lidocaine 4% cream applied to wound bed prior to debridement (In Clinic Only). Skin Barriers/Peri-Wound Care Wound #3 Right,Lateral Lower Leg o Triamcinolone Acetonide Ointment (TCA) Primary Wound Dressing o Hydrafera Blue Ready Transfer Secondary Dressing Wound #3 Right,Lateral Lower Leg o ABD pad o Kerlix and Coban Dressing Change Frequency Wound #3 Right,Lateral Lower Leg o Change Dressing Monday, Wednesday, Friday Follow-up Appointments Wound #3 Right,Lateral Lower Leg o Return Appointment in 1 week. Edema Control Wound #3 Right,Lateral Lower Leg o Kerlix and Coban - Right Lower Extremity o Elevate legs to the level of the heart and pump ankles as often as possible Additional Orders / Instructions Wound #3 Right,Lateral Lower Leg o Increase protein intake. Home Health Wound #3 [place] Visits - Monday and Friday (Wednesdays in clinic) [facility] Nurse may visit PRN to address patientos wound care needs. o FACE TO FACE ENCOUNTER: MEDICARE and MEDICAID PATIENTS: I certify that this patient is under my care and that I had a face-to-face encounter that meets  the physician face-to-face encounter requirements with this patient on this date. The encounter with the patient was in whole or in part for the following MEDICAL CONDITION: (primary reason for [place]) MEDICAL NECESSITY: I certify, that based on my findings, NURSING services are a medically necessary home health service. HOME BOUND STATUS: I certify that my clinical findings support that this patient is homebound (i.e., Due to illness or injury, pt requires aid of supportive devices such as crutches, cane, wheelchairs, walkers, the use of special transportation or the assistance of another person to leave their place of residence. There is a normal inability to leave the home and doing so requires considerable and taxing effort. Other absences are for  medical reasons / religious services and are infrequent or of short duration when for other reasons). ARALY, GICK ([id number]) o If current dressing causes regression in wound condition, may D/C ordered dressing product/s and apply Normal Saline Moist Dressing daily until next Short / Other MD appointment. [place] of regression in wound condition at [phone]. o Please direct any NON-WOUND related issues/requests for orders to patient's Primary Care Physician Electronic Signature(s) Signed: 02/25/2020 4:28:02 PM By: Linton Ham MD Signed: 02/25/2020 5:22:43 PM By: Gretta Cool, BSN, RN, [name], Kim RN, BSN Entered By: Gretta Cool, BSN, RN, [name], Kim on 02/25/2020 14:15:53 [name] ([id number]) -------------------------------------------------------------------------------- Problem List Details Patient Name: SHANTINA, VARNS. Date of Service: 02/25/2020 1:30 PM Medical Record Number: [id number] Patient Account Number: [id number] Date of Birth/Sex: 12/15/1925 (86 y.o. F) Treating RN: Cornell Barman Primary Care Provider: Derinda Late Other Clinician: Referring Provider: BABAOFF, MARCUS Treating Provider/Extender: Tito Dine in Treatment: 4 Active Problems ICD-10 Encounter Code Description Active Date MDM Diagnosis S81.811D Laceration without foreign body, right lower leg, subsequent encounter 01/28/2020 No Yes L97.812 Non-pressure chronic ulcer of other part of right lower leg with fat layer 01/28/2020 No Yes exposed I87.321 Chronic venous hypertension (idiopathic) with inflammation of right 01/28/2020 No Yes lower extremity Inactive Problems Resolved Problems Electronic Signature(s) Signed: 02/25/2020 4:28:02 PM By: Linton Ham MD Entered By: Linton Ham on 02/25/2020 14:15:17 Lamp, [name] ([id number]) -------------------------------------------------------------------------------- Progress Note Details Patient Name:  [name]. Date of Service: 02/25/2020 1:30 PM Medical Record Number: [id number] Patient Account Number: [id number] Date of Birth/Sex: Jul 10, 1926 (86 y.o. F) Treating RN: Cornell Barman Primary Care Provider: Derinda Late Other Clinician: Referring Provider: BABAOFF, MARCUS Treating Provider/Extender: Ricard Dillon Weeks in Treatment: 4 Subjective History of Present Illness (HPI) 06/06/17 on evaluation today patient presents for evaluation concerning an ulcer which she initially had arise as a result of striking her left lower extremity money bed frame. With that being said this was roughly one month ago and unfortunately the wound despite two courses of Keflex have not really made a dramatic improvement. This has continued to drain as well as being exquisitely tender at times. Even to the point that she is having a difficult time walking. Patient does have chronic atrial fibrillation which subsequently leads to her being on long-term anticoagulant therapy and this causes ED bruising which may have contributed to this entry and where things are at this point. She does have valvular heart disease as well as hypertension and her last INR was 2.7. Currently Neosporin, Gauls, and an ace wrap has been utilized. Keflex is the antibiotic that she has been on. Patient has not had a culture up to this point and she states that her blood pressure at the primary care office yesterday  was 138/72. Her pain is significant related to be an eight out of 10. No fevers, chills, nausea, or vomiting noted at this time. 06/20/17; patient was admitted here 2 weeks ago. She had a traumatic wound on her left lateral lower leg probably in the setting of some degree of venous insufficiency with surrounding cellulitis. She had been on Keflex, after we saw her she was put on doxycycline which she apparently did not tolerate. Culture that was done at the time showed Morganella which was resistant to Keflex. Apparently  the patient continued to take Keflex after the appointment. She is also on Coumadin. I had tried to change her antibiotic when the Morganella culture was brought to my attention however apparently our staff could not reach the patient to inform them of the antibiotic change 06/26/17; patient has wound on her left lateral lower extremity in the setting of some degree of venous insufficiency. I gave her cefdinir last week and she is completing this. There is no evidence of surrounding infection. Aggressive debridement last week, wound bed looks healthier. We've been using Aquacel. They're traveling in the [place] next week we'll see her back in 2 weeks unless there are problems. 07/10/17; the patient is been using Aquacel Ag her husband is changing this with Kerlix and conform. She still complains of a lot of pain. Apparently with the antibiotics. We gave 2 or 3 weeks ago. Her INR went up to 6, this was managed by her primary physician 07/17/17; she is using Aquacel Ag and a border foam. Still discomfort although dimensions are better. 07/24/17; patient or for review of a trauma wound on her left anterior leg in the setting of some degree of chronic venous insufficiency she has been using Aquacel Ag and a border foam and making progress. 07/31/17; only a small open area of this original significant trauma is still open. Her husband is changing this every second day [Aquacel Ag] Readmission: 01/14/19 on evaluation today patient is that she seen for initial inspection during the office visit today concerning a trauma/skin tear to the left anterior lower extremity. This is roughly the same region where she was previously treated and years past when she was here in the clinic. Nonetheless upon evaluation today the patient's wound actually showed signs of decent granulation around the edge although unfortunately the skin flap that was torn back did fold under on itself and the flap itself is dying. She does have  some eschar surrounded the edges of the wound at this point. Nonetheless this also was very tender for her. The patient does have a history of hypertension, atrial fibrillation, and long-term use anticoagulant therapy. 01/21/19 on evaluation today patient's wound on the anterior portion of her lower extremity appears to still be necrotic as far as the surface of the wound is concerned. Unfortunately there is no significant improvement at this point compared to last week they were not able to get the Santyl that are using Medihoney and maybe one reason why. The other is I think this may be stained to drive. Possibly adding something such as mepitel over top of the Medihoney may help to loosen this up quite a bit which I think would be in her best interest. Also she still has a lot of erythema and is very tender to touch I'm afraid that we may need to switch to a different antibiotic to see if this will be of benefit there still really nothing that I can culture to get a better picture  of what's going on and what we need to do from the standpoint of antibiotics. I do believe the patient needs a referral Talmuds me to vascular in order to evaluate her blood flow based on what I'm seeing they will be able to perform TBI testing if nothing else along with the vascular evaluation to see if there's any evidence for limited blood flow to this lower extremity. 4/8; I have reviewed the patient's arterial studies and they are really quite good. There should be no arterial impediments to healing the wound on the right anterior tibial area. 4/15; patient has a small open area over the left mid tibial area. Still requiring debridement we are using Santyl here and Medihoney at home [Santyl unaffordable] 4/22; difficult, presumably traumatic area over the left mid tibial area in the setting of chronic venous insufficiency. We have been using Santyl here and meta honey at home. The wound surface is getting gradually  better and slight improvement in dimensions. Her husband is changing this daily 4/29; left mid tibia presumably traumatic wound in the setting of chronic venous insufficiency. We have been using Santyl and a combination of Medihoney at home. Arrives today with a much better looking healthy granulated surface. We will change the primary dressing to [facility] 5/6; left mid tibia smaller reasonably healthy looking wound using Hydrofera Blue since last week 5/20-Patient returns to clinic with a left mid tibial wound looking better, we have been using Hydrofera Blue since the previous week 5/27; patient returns to clinic with a left mid tibial wound covered in raised eschar. We have been using Hydrofera Blue her husband changes the dressings using a border foam cover READMISSION 01/28/20 Mrs. Cieslik is a patient who is now 86 years old. As usual she comes in with her husband who is her primary caregiver. They were here last from March through May 2020 with a traumatic wound on the left anterior tibia we eventually heal this out. She has chronic venous insufficiency. She is also here previously in 2018. Her current problem started 8 days ago. She went in the hospital for a thoracic level kyphoplasty. The procedure she developed a skin tear on her leg. This was mentioned to her. Her husband is applying [employer] to the area and they are in for our review of this. She finds this much too uncomfortable to attempt ABIs although her ABIs when she was in the clinic last year were quite normal bilaterally Reber, Corey C. ([id number]) Past medical history she is not a diabetic. As noted she had a T9 and T6 kyphoplasty, Alzheimer's disease, chronic atrial fibrillation on Coumadin and hypertension 4/14; patient arrives in clinic having removed part of the wrap. We attempted to leave this on all week. Unfortunately none that none of the tissue that looks semiviable last week has remained viable. She  required an extensive debridement. We have been using silver alginate 4/21; the patient arrived today with tremendous erythema around the large part of the wound on the right lateral leg. This extends medially and down towards her foot. I marked the area but I think the extent of the likely cellulitis is beyond what can be safely managed as an outpatient. 02/23/2020 upon evaluation today patient is seen today though she is typically followed by Dr. Dellia Nims. Last time he saw her he sent her to the hospital and she subsequently was in the hospital for some time. In fact it was from 02/11/2020 through 02/19/2020. During that time she was on antibiotic  therapy by way of IV antibiotics. Her leg does appear to be doing better today compared to what we saw then. Nonetheless she had a culture that was obtained while she was in the hospital by Dr. Steva Ready. This ended up not growing anything which is not really surprising considering she was on antibiotics which were fairly strong at the time of culture. There was no growth at all after 2 days. With that being said Dr. Steva Ready apparently discussed with Dr. Dellia Nims per her note anyway that she wanted the patient off of the antibiotics for a week and then to repeat a culture in the outpatient setting. She also has a suspicion for the possibility of pyoderma and has referred the patient to Dr. Nehemiah Massed and the patient is supposed to be seeing him sometime I believe Thursday of this week. Nonetheless I explained that pyoderma can sometimes begin as a injury or insult that then worsens. Obviously the initial injury here appears to have been a scraping of her leg that occurred when the patient was being transferred off of the table after a kyphoplasty procedure. Fortunately again as mentioned the infection seems to be doing significantly better based on what I am seeing at this point compared to previous pictures that I reviewed today. 5/5; the patient is back to my  clinic today after seeing with stone 2 days ago. She is on [employer] with a Kerlix Coban I have reviewed her recent history. We had sent her to the hospital with presumed cellulitis considerable erythema in the lateral leg. She received prolonged courses of antibiotics in the hospital. I received a call from one of the hospitalist to report that he didn't feel that she was responding to antibiotics although as noted 2 days ago by Jeri Cos the wound actually looks a lot better along with the periwound skin which is not nearly as erythematous. He raised the possibility of pyoderma gangrenosum which also had been brought up by Dr. Steva Ready the infectious disease consultant. The discharge culture was negative but she was still on antibiotics The pyoderma gangrenosum apparently has something to do with the fact that both her sons have Crohn's disease although the patient does not have Crohn's disease. I told the doctor that call me I didn't really think this fit with the history which was a trauma induced wound why the patient was having a kyphoplasty. In fact her husband is still very angry about this with the hospital. She also has severe chronic venous insufficiency with hemosiderin and stasis dermatitis and has had wounds in both legs. Objective Constitutional Sitting or standing Blood Pressure is within target range for patient.. Pulse regular and within target range for patient.Marland Kitchen Respirations regular, non- labored and within target range.. Temperature is normal and within the target range for the patient.Marland Kitchen appears in no distress. Vitals Time Taken: 1:40 PM, Height: 62 in, Weight: 98 lbs, BMI: 17.9, Temperature: 97.8 F, Pulse: 76 bpm, Respiratory Rate: 16 breaths/min, Blood Pressure: 145/70 mmHg. Respiratory Respiratory effort is easy and symmetric bilaterally. Rate is normal at rest and on room air.. Cardiovascular Pedal pulses are palpable. We have good edema  control. Psychiatric Same anxious demeanor associated with cognitive impairment. General Notes: Wound exam; the patient's right lateral periwound area actually looks a lot better. She has hemosiderin deposition and stasis dermatitis. Under illumination the surface of the wound is covered in a tightly adherent slough. It is very difficult to debride this so I elected to put this off to see  if Hydrofera Blue to help loosen this up somewhat. Integumentary (Hair, Skin) The degree of erythema around the wound and the lateral leg is quite a bit better.. Wound #3 status is Open. Original cause of wound was Trauma. The wound is located on the Right,Lateral Lower Leg. The wound measures 6.5cm length x 4.5cm width x 0.2cm depth; 22.973cm^2 area and 4.595cm^3 volume. There is Fat Layer (Subcutaneous Tissue) Exposed exposed. There is a medium amount of purulent drainage noted. The wound margin is flat and intact. There is small (1-33%) pink granulation within the wound bed. There is a large (67-100%) amount of necrotic tissue within the wound bed including Adherent Slough. Assessment TAKAYA, BUZA ([id number]) Active Problems ICD-10 Laceration without foreign body, right lower leg, subsequent encounter Non-pressure chronic ulcer of other part of right lower leg with fat layer exposed Chronic venous hypertension (idiopathic) with inflammation of right lower extremity Plan Wound Cleansing: Wound #3 Right,Lateral Lower Leg: Cleanse wound with mild soap and water Anesthetic (add to Medication List): Wound #3 Right,Lateral Lower Leg: Topical Lidocaine 4% cream applied to wound bed prior to debridement (In Clinic Only). Skin Barriers/Peri-Wound Care: Wound #3 Right,Lateral Lower Leg: Triamcinolone Acetonide Ointment (TCA) Primary Wound Dressing: Hydrafera Blue Ready Transfer Secondary Dressing: Wound #3 Right,Lateral Lower Leg: ABD pad Kerlix and Coban Dressing Change Frequency: Wound #3  Right,Lateral Lower Leg: Change Dressing Monday, Wednesday, Friday Follow-up Appointments: Wound #3 Right,Lateral Lower Leg: Return Appointment in 1 week. Edema Control: Wound #3 Right,Lateral Lower Leg: Kerlix and Coban - Right Lower Extremity Elevate legs to the level of the heart and pump ankles as often as possible Additional Orders / Instructions: Wound #3 Right,Lateral Lower Leg: Increase protein intake. Home Health: Wound #3 Right,Lateral Lower Leg: Continue Home Health Visits - Monday and Friday (Wednesdays in clinic) [place] Nurse may visit PRN to address patient s wound care needs. FACE TO FACE ENCOUNTER: MEDICARE and MEDICAID PATIENTS: I certify that this patient is under my care and that I had a face-to-face encounter that meets the physician face-to-face encounter requirements with this patient on this date. The encounter with the patient was in whole or in part for the following MEDICAL CONDITION: (primary reason for [place]) MEDICAL NECESSITY: I certify, that based on my findings, NURSING services are a medically necessary home health service. HOME BOUND STATUS: I certify that my clinical findings support that this patient is homebound (i.e., Due to illness or injury, pt requires aid of supportive devices such as crutches, cane, wheelchairs, walkers, the use of special transportation or the assistance of another person to leave their place of residence. There is a normal inability to leave the home and doing so requires considerable and taxing effort. Other absences are for medical reasons / religious services and are infrequent or of short duration when for other reasons). If current dressing causes regression in wound condition, may D/C ordered dressing product/s and apply Normal Saline Moist Dressing daily until next [place] / Other MD appointment. [place] of regression in wound condition at [phone]. Please direct any  NON-WOUND related issues/requests for orders to patient's Primary Care Physician 1. I continued the Hydrofera Blue with Kerlix Coban 2. I think he has home health coming out to change this on Friday we'll have to figure out which home health company this was 3. Although I applaud the fact that the hospitalists considered pyoderma gangrenosum as a cause of an inflammatory ulcer in the lower extremities I think this  flies in the face of the history which was a traumatic wound that actually occurred in the hospital. She also has chronic venous hypertension and stasis dermatitis which is severe. I don't think she requires a biopsy 4. I think her leg looks a lot better than when I sent her to the hospital and I'm not sure I share the nihilism that the heart discharging hospitalist expressed about the antibiotic therapy. I suppose it is possible that all of this was severe venous inflammation that got better as she was supine although I think she had cellulitis 5. She may have a difficult debridement ahead. If the surface of this isn't any better next week I'll likely change her to Iodoflex. Electronic Signature(s) Signed: 02/25/2020 4:28:02 PM By: Linton Ham MD Entered By: Linton Ham on 02/25/2020 14:23:50 HUNTLEE, WINSETT ([id number]) YUNA, GOLIAS ([id number]) -------------------------------------------------------------------------------- SuperBill Details Patient Name: [name] Date of Service: 02/25/2020 Medical Record Number: [id number] Patient Account Number: [id number] Date of Birth/Sex: 07/09/1926 (86 y.o. F) Treating RN: Cornell Barman Primary Care Provider: Derinda Late Other Clinician: Referring Provider: BABAOFF, MARCUS Treating Provider/Extender: Ricard Dillon Weeks in Treatment: 4 Diagnosis Coding ICD-10 Codes Code Description S81.811D Laceration without foreign body, right lower leg, subsequent encounter L97.812 Non-pressure chronic ulcer of other part  of right lower leg with fat layer exposed I87.321 Chronic venous hypertension (idiopathic) with inflammation of right lower extremity Facility Procedures CPT4 Code: [id number] Description: [id number] - WOUND CARE VISIT-LEV 3 EST PT Modifier: Quantity: 1 Physician Procedures CPT4 Code: [id number] Description: [id number] - WC PHYS LEVEL 4 - EST PT Modifier: Quantity: 1 CPT4 Code: Description: ICD-10 Diagnosis Description S81.811D Laceration without foreign body, right lower leg, subsequent encounter [id number] Non-pressure chronic ulcer of other part of right lower leg with fat l I87.321 Chronic venous hypertension (idiopathic) with  inflammation of right lo Modifier: ayer exposed wer extremity Quantity: Electronic Signature(s) Signed: 02/25/2020 4:28:02 PM By: Linton Ham MD Entered By: Linton Ham on 02/25/2020 14:24:15

## 2020-03-03 ENCOUNTER — Encounter: Payer: Medicare Other | Admitting: Internal Medicine

## 2020-03-03 ENCOUNTER — Other Ambulatory Visit: Payer: Self-pay

## 2020-03-03 DIAGNOSIS — S81811D Laceration without foreign body, right lower leg, subsequent encounter: Secondary | ICD-10-CM | POA: Diagnosis not present

## 2020-03-10 ENCOUNTER — Encounter: Payer: Medicare Other | Admitting: Internal Medicine

## 2020-03-10 ENCOUNTER — Other Ambulatory Visit: Payer: Self-pay

## 2020-03-10 DIAGNOSIS — S81811D Laceration without foreign body, right lower leg, subsequent encounter: Secondary | ICD-10-CM | POA: Diagnosis not present

## 2020-03-11 NOTE — Progress Notes (Signed)
LILLIANNA, EPHRAIM ([id number]) Visit Report for 03/10/2020 Debridement Details [name], [name]. Date of Service: 03/10/2020 2:45 PM Medical Record Number: [id number] Patient Account Number: [id number] Date of Birth/Sex: Feb 20, 1926 (86 y.o. F) Treating RN: Cornell Barman Primary Care Provider: Derinda Late Other Clinician: Referring Provider: BABAOFF, MARCUS Treating Provider/Extender: Ricard Dillon Weeks in Treatment: 6 Debridement Performed for Wound #3 Right,Lateral Lower Leg Assessment: Performed By: Physician Ricard Dillon, MD Debridement Type: Debridement Severity of Tissue Pre Debridement: Fat layer exposed Level of Consciousness (Pre- Awake and Alert procedure): Pre-procedure Verification/Time Out Yes - 15:30 Taken: Start Time: 15:30 Pain Control: Lidocaine Total Area Debrided (L x W): 3.4 (cm) x 2.9 (cm) = 9.86 (cm) Tissue and other material Viable, Slough, Subcutaneous, Slough debrided: Level: Skin/Subcutaneous Tissue Debridement Description: Excisional Instrument: Curette Bleeding: Moderate Hemostasis Achieved: Pressure End Time: 15:35 Response to Treatment: Procedure was tolerated well Level of Consciousness (Post- Awake and Alert procedure): Post Debridement Measurements of Total Wound Length: (cm) 3.4 Width: (cm) 2.9 Depth: (cm) 0.3 Volume: (cm) 2.323 Character of Wound/Ulcer Post Debridement: Stable Severity of Tissue Post Debridement: Fat layer exposed Post Procedure Diagnosis Same as Pre-procedure Electronic Signature(s) Signed: 03/10/2020 5:06:12 PM By: Linton Ham MD Signed: 03/11/2020 9:48:50 AM By: Gretta Cool, BSN, RN, [name], Kim RN, BSN Entered By: Linton Ham on 03/10/2020 16:12:06 Trulock, [name] ([id number]) -------------------------------------------------------------------------------- HPI Details [name]. Date of Service: 03/10/2020 2:45 PM Medical Record Number: [id number] Patient  Account Number: [id number] Date of Birth/Sex: 02-24-1926 (86 y.o. F) Treating RN: Cornell Barman Primary Care Provider: Derinda Late Other Clinician: Referring Provider: BABAOFF, MARCUS Treating Provider/Extender: Tito Dine in Treatment: 6 History of Present Illness HPI Description: 06/06/17 on evaluation today patient presents for evaluation concerning an ulcer which she initially had arise as a result of striking her left lower extremity money bed frame. With that being said this was roughly one month ago and unfortunately the wound despite two courses of Keflex have not really made a dramatic improvement. This has continued to drain as well as being exquisitely tender at times. Even to the point that she is having a difficult time walking. Patient does have chronic atrial fibrillation which subsequently leads to her being on long-term anticoagulant therapy and this causes ED bruising which may have contributed to this entry and where things are at this point. She does have valvular heart disease as well as hypertension and her last INR was 2.7. Currently Neosporin, Gauls, and an ace wrap has been utilized. Keflex is the antibiotic that she has been on. Patient has not had a culture up to this point and she states that her blood pressure at the primary care office yesterday was 138/72. Her pain is significant related to be an eight out of 10. No fevers, chills, nausea, or vomiting noted at this time. 06/20/17; patient was admitted here 2 weeks ago. She had a traumatic wound on her left lateral lower leg probably in the setting of some degree of venous insufficiency with surrounding cellulitis. She had been on Keflex, after we saw her she was put on doxycycline which she apparently did not tolerate. Culture that was done at the time showed Morganella which was resistant to Keflex. Apparently the patient continued to take Keflex after the appointment. She is also on Coumadin. I had tried  to change her antibiotic when the Morganella culture was brought to my attention however apparently our staff could not reach the patient to inform them of  the antibiotic change 06/26/17; patient has wound on her left lateral lower extremity in the setting of some degree of venous insufficiency. I gave her cefdinir last week and she is completing this. There is no evidence of surrounding infection. Aggressive debridement last week, wound bed looks healthier. We've been using Aquacel. They're traveling in the [place] next week we'll see her back in 2 weeks unless there are problems. 07/10/17; the patient is been using Aquacel Ag her husband is changing this with Kerlix and conform. She still complains of a lot of pain. Apparently with the antibiotics. We gave 2 or 3 weeks ago. Her INR went up to 6, this was managed by her primary physician 07/17/17; she is using Aquacel Ag and a border foam. Still discomfort although dimensions are better. 07/24/17; patient or for review of a trauma wound on her left anterior leg in the setting of some degree of chronic venous insufficiency she has been using Aquacel Ag and a border foam and making progress. 07/31/17; only a small open area of this original significant trauma is still open. Her husband is changing this every second day [Aquacel Ag] Readmission: 01/14/19 on evaluation today patient is that she seen for initial inspection during the office visit today concerning a trauma/skin tear to the left anterior lower extremity. This is roughly the same region where she was previously treated and years past when she was here in the clinic. Nonetheless upon evaluation today the patient's wound actually showed signs of decent granulation around the edge although unfortunately the skin flap that was torn back did fold under on itself and the flap itself is dying. She does have some eschar surrounded the edges of the wound at this point. Nonetheless this also was very  tender for her. The patient does have a history of hypertension, atrial fibrillation, and long-term use anticoagulant therapy. 01/21/19 on evaluation today patient's wound on the anterior portion of her lower extremity appears to still be necrotic as far as the surface of the wound is concerned. Unfortunately there is no significant improvement at this point compared to last week they were not able to get the Santyl that are using Medihoney and maybe one reason why. The other is I think this may be stained to drive. Possibly adding something such as mepitel over top of the Medihoney may help to loosen this up quite a bit which I think would be in her best interest. Also she still has a lot of erythema and is very tender to touch I'm afraid that we may need to switch to a different antibiotic to see if this will be of benefit there still really nothing that I can culture to get a better picture of what's going on and what we need to do from the standpoint of antibiotics. I do believe the patient needs a referral Talmuds me to vascular in order to evaluate her blood flow based on what I'm seeing they will be able to perform TBI testing if nothing else along with the vascular evaluation to see if there's any evidence for limited blood flow to this lower extremity. 4/8; I have reviewed the patient's arterial studies and they are really quite good. There should be no arterial impediments to healing the wound on the right anterior tibial area. 4/15; patient has a small open area over the left mid tibial area. Still requiring debridement we are using Santyl here and Medihoney at home [Santyl unaffordable] 4/22; difficult, presumably traumatic area over the left mid  tibial area in the setting of chronic venous insufficiency. We have been using Santyl here and meta honey at home. The wound surface is getting gradually better and slight improvement in dimensions. Her husband is changing this daily 4/29; left  mid tibia presumably traumatic wound in the setting of chronic venous insufficiency. We have been using Santyl and a combination of Medihoney at home. Arrives today with a much better looking healthy granulated surface. We will change the primary dressing to [facility] 5/6; left mid tibia smaller reasonably healthy looking wound using Hydrofera Blue since last week 5/20-Patient returns to clinic with a left mid tibial wound looking better, we have been using Hydrofera Blue since the previous week 5/27; patient returns to clinic with a left mid tibial wound covered in raised eschar. We have been using Hydrofera Blue her husband changes the dressings using a border foam cover READMISSION 01/28/20 Mrs. Blasko is a patient who is now 86 years old. As usual she comes in with her husband who is her primary caregiver. They were here last from March through May 2020 with a traumatic wound on the left anterior tibia we eventually heal this out. She has chronic venous insufficiency. She is also here previously in 2018. Her current problem started 8 days ago. She went in the hospital for a thoracic level kyphoplasty. The procedure she developed a skin tear on her leg. This was mentioned to her. Her husband is applying Medihoney to the area and they are in for our review of this. She finds this much too uncomfortable to attempt ABIs although her ABIs when she was in the clinic last year were quite normal bilaterally Mcclean, Fadia C. ([id number]) Past medical history she is not a diabetic. As noted she had a T9 and T6 kyphoplasty, Alzheimer's disease, chronic atrial fibrillation on Coumadin and hypertension 4/14; patient arrives in clinic having removed part of the wrap. We attempted to leave this on all week. Unfortunately none that none of the tissue that looks semiviable last week has remained viable. She required an extensive debridement. We have been using silver alginate 4/21; the patient arrived  today with tremendous erythema around the large part of the wound on the right lateral leg. This extends medially and down towards her foot. I marked the area but I think the extent of the likely cellulitis is beyond what can be safely managed as an outpatient. 02/23/2020 upon evaluation today patient is seen today though she is typically followed by Dr. Dellia Nims. Last time he saw her he sent her to the hospital and she subsequently was in the hospital for some time. In fact it was from 02/11/2020 through 02/19/2020. During that time she was on antibiotic therapy by way of IV antibiotics. Her leg does appear to be doing better today compared to what we saw then. Nonetheless she had a culture that was obtained while she was in the hospital by Dr. Steva Ready. This ended up not growing anything which is not really surprising considering she was on antibiotics which were fairly strong at the time of culture. There was no growth at all after 2 days. With that being said Dr. Steva Ready apparently discussed with Dr. Dellia Nims per her note anyway that she wanted the patient off of the antibiotics for a week and then to repeat a culture in the outpatient setting. She also has a suspicion for the possibility of pyoderma and has referred the patient to Dr. Nehemiah Massed and the patient is supposed to be seeing  him sometime I believe Thursday of this week. Nonetheless I explained that pyoderma can sometimes begin as a injury or insult that then worsens. Obviously the initial injury here appears to have been a scraping of her leg that occurred when the patient was being transferred off of the table after a kyphoplasty procedure. Fortunately again as mentioned the infection seems to be doing significantly better based on what I am seeing at this point compared to previous pictures that I reviewed today. 5/5; the patient is back to my clinic today after seeing with stone 2 days ago. She is on [employer] with a Kerlix Coban I  have reviewed her recent history. We had sent her to the hospital with presumed cellulitis considerable erythema in the lateral leg. She received prolonged courses of antibiotics in the hospital. I received a call from one of the hospitalist to report that he didn't feel that she was responding to antibiotics although as noted 2 days ago by Jeri Cos the wound actually looks a lot better along with the periwound skin which is not nearly as erythematous. He raised the possibility of pyoderma gangrenosum which also had been brought up by Dr. Steva Ready the infectious disease consultant. The discharge culture was negative but she was still on antibiotics The pyoderma gangrenosum apparently has something to do with the fact that both her sons have Crohn's disease although the patient does not have Crohn's disease. I told the doctor that call me I didn't really think this fit with the history which was a trauma induced wound why the patient was having a kyphoplasty. In fact her husband is still very angry about this with the hospital. She also has severe chronic venous insufficiency with hemosiderin and stasis dermatitis and has had wounds in both legs. 5/12; venous insufficiency wound on the right lower lateral lower leg initially trauma. Still debris on the surface of this wound that undergoes a difficult debridement. We have been using Hydrofera Blue 5/19; venous insufficiency wound on the right lower lateral leg initially trauma. Better looking wound surface and a rim of epithelialization. Surface area is smaller we have been using Hydrofera Blue under compression Electronic Signature(s) Signed: 03/10/2020 5:06:12 PM By: Linton Ham MD Entered By: Linton Ham on 03/10/2020 16:12:46 Cure, [name] ([id number]) -------------------------------------------------------------------------------- Physical Exam Details [name], [name] Date of Service: 03/10/2020 2:45 PM Medical  Record Number: [id number] Patient Account Number: [id number] Date of Birth/Sex: [birth year] (86 y.o. F) Treating RN: Cornell Barman Primary Care Provider: Derinda Late Other Clinician: Referring Provider: BABAOFF, MARCUS Treating Provider/Extender: Ricard Dillon Weeks in Treatment: 6 Notes Wound exam; hemosiderin and stasis dermatitis. Still tightly adherent debris gentle debridement with a #5 curette to remove fibrinous necrotic material. Things look better. There is a rim of epithelialization no evidence of infection. Electronic Signature(s) Signed: 03/10/2020 5:06:12 PM By: Linton Ham MD Entered By: Linton Ham on 03/10/2020 16:13:32 [name] ([id number]) -------------------------------------------------------------------------------- Physician Orders Details [name] Date of Service: 03/10/2020 2:45 PM Medical Record Number: [id number] Patient Account Number: [id number] Date of Birth/Sex: 09/05/1926 (86 y.o. F) Treating RN: Cornell Barman Primary Care Provider: BABAOFF, MARCUS Other Clinician: Referring Provider: BABAOFF, MARCUS Treating Provider/Extender: Tito Dine in Treatment: 6 Verbal / Phone Orders: No Diagnosis Coding Wound Cleansing Wound #3 Right,Lateral Lower Leg o Cleanse wound with mild soap and water Anesthetic (add to Medication List) Wound #3 Right,Lateral Lower Leg o Topical Lidocaine 4% cream applied to wound bed prior to  debridement (In Clinic Only). Primary Wound Dressing o Santyl Ointment - under Hydrafera Blue o Hydrafera Blue Ready Transfer Secondary Dressing Wound #3 Right,Lateral Lower Leg o ABD pad o Kerlix and Coban Dressing Change Frequency Wound #3 Right,Lateral Lower Leg o Change Dressing Monday, Wednesday, Friday Follow-up Appointments Wound #3 Right,Lateral Lower Leg o Return Appointment in 1 week. Edema Control Wound #3 Right,Lateral Lower Leg o Kerlix and Coban - Right  Lower Extremity o Elevate legs to the level of the heart and pump ankles as often as possible Additional Orders / Instructions Wound #3 Right,Lateral Lower Leg o Increase protein intake. Home Health Wound #3 [place] Visits - Monday and Friday (Wednesdays in clinic) [facility] Nurse may visit PRN to address patientos wound care needs. o FACE TO FACE ENCOUNTER: MEDICARE and MEDICAID PATIENTS: I certify that this patient is under my care and that I had a face-to-face encounter that meets the physician face-to-face encounter requirements with this patient on this date. The encounter with the patient was in whole or in part for the following MEDICAL CONDITION: (primary reason for [place]) MEDICAL NECESSITY: I certify, that based on my findings, NURSING services are a medically necessary home health service. HOME BOUND STATUS: I certify that my clinical findings support that this patient is homebound (i.e., Due to illness or injury, pt requires aid of supportive devices such as crutches, cane, wheelchairs, walkers, the use of special transportation or the assistance of another person to leave their place of residence. There is a normal inability to leave the home and doing so requires considerable and taxing effort. Other absences are for medical reasons / religious services and are infrequent or of short duration when for other reasons). o If current dressing causes regression in wound condition, may D/C ordered dressing product/s and apply Normal Saline Moist Dressing daily until next [place] / Other MD appointment. [place] of regression in wound condition at [phone]. o Please direct any NON-WOUND related issues/requests for orders to patient's Primary Care Physician TONNA, FRONTZ ([id number]) Electronic Signature(s) Signed: 03/10/2020 5:06:12 PM By: Linton Ham MD Signed: 03/11/2020  9:48:50 AM By: Gretta Cool, BSN, RN, [name], Kim RN, BSN Entered By: Gretta Cool, BSN, RN, [name], Kim on 03/10/2020 15:36:50 [name] ([id number]) -------------------------------------------------------------------------------- Problem List Details [name], [name]. Date of Service: 03/10/2020 2:45 PM Medical Record Number: [id number] Patient Account Number: [id number] Date of Birth/Sex: September 02, 1926 (86 y.o. F) Treating RN: Cornell Barman Primary Care Provider: Derinda Late Other Clinician: Referring Provider: BABAOFF, MARCUS Treating Provider/Extender: Tito Dine in Treatment: 6 Active Problems ICD-10 Encounter Code Description Active Date MDM Diagnosis S81.811D Laceration without foreign body, right lower leg, subsequent encounter 01/28/2020 No Yes L97.812 Non-pressure chronic ulcer of other part of right lower leg with fat layer 01/28/2020 No Yes exposed I87.321 Chronic venous hypertension (idiopathic) with inflammation of right 01/28/2020 No Yes lower extremity Inactive Problems Resolved Problems Electronic Signature(s) Signed: 03/10/2020 5:06:12 PM By: Linton Ham MD Entered By: Linton Ham on 03/10/2020 16:11:30 Henkels, [name] ([id number]) -------------------------------------------------------------------------------- Progress Note Details [name]. Date of Service: 03/10/2020 2:45 PM Medical Record Number: [id number] Patient Account Number: [id number] Date of Birth/Sex: [birth year] (86 y.o. F) Treating RN: Cornell Barman Primary Care Provider: Derinda Late Other Clinician: Referring Provider: BABAOFF, MARCUS Treating Provider/Extender: Ricard Dillon Weeks in Treatment: 6 Subjective History of Present Illness (HPI) 06/06/17 on evaluation today patient presents for evaluation concerning  an ulcer which she initially had arise as a result of striking her left lower extremity money bed frame. With that being said this was roughly  one month ago and unfortunately the wound despite two courses of Keflex have not really made a dramatic improvement. This has continued to drain as well as being exquisitely tender at times. Even to the point that she is having a difficult time walking. Patient does have chronic atrial fibrillation which subsequently leads to her being on long-term anticoagulant therapy and this causes ED bruising which may have contributed to this entry and where things are at this point. She does have valvular heart disease as well as hypertension and her last INR was 2.7. Currently Neosporin, Gauls, and an ace wrap has been utilized. Keflex is the antibiotic that she has been on. Patient has not had a culture up to this point and she states that her blood pressure at the primary care office yesterday was 138/72. Her pain is significant related to be an eight out of 10. No fevers, chills, nausea, or vomiting noted at this time. 06/20/17; patient was admitted here 2 weeks ago. She had a traumatic wound on her left lateral lower leg probably in the setting of some degree of venous insufficiency with surrounding cellulitis. She had been on Keflex, after we saw her she was put on doxycycline which she apparently did not tolerate. Culture that was done at the time showed Morganella which was resistant to Keflex. Apparently the patient continued to take Keflex after the appointment. She is also on Coumadin. I had tried to change her antibiotic when the Morganella culture was brought to my attention however apparently our staff could not reach the patient to inform them of the antibiotic change 06/26/17; patient has wound on her left lateral lower extremity in the setting of some degree of venous insufficiency. I gave her cefdinir last week and she is completing this. There is no evidence of surrounding infection. Aggressive debridement last week, wound bed looks healthier. We've been using Aquacel. They're traveling in the  [place] next week we'll see her back in 2 weeks unless there are problems. 07/10/17; the patient is been using Aquacel Ag her husband is changing this with Kerlix and conform. She still complains of a lot of pain. Apparently with the antibiotics. We gave 2 or 3 weeks ago. Her INR went up to 6, this was managed by her primary physician 07/17/17; she is using Aquacel Ag and a border foam. Still discomfort although dimensions are better. 07/24/17; patient or for review of a trauma wound on her left anterior leg in the setting of some degree of chronic venous insufficiency she has been using Aquacel Ag and a border foam and making progress. 07/31/17; only a small open area of this original significant trauma is still open. Her husband is changing this every second day [Aquacel Ag] Readmission: 01/14/19 on evaluation today patient is that she seen for initial inspection during the office visit today concerning a trauma/skin tear to the left anterior lower extremity. This is roughly the same region where she was previously treated and years past when she was here in the clinic. Nonetheless upon evaluation today the patient's wound actually showed signs of decent granulation around the edge although unfortunately the skin flap that was torn back did fold under on itself and the flap itself is dying. She does have some eschar surrounded the edges of the wound at this point. Nonetheless this also was very tender  for her. The patient does have a history of hypertension, atrial fibrillation, and long-term use anticoagulant therapy. 01/21/19 on evaluation today patient's wound on the anterior portion of her lower extremity appears to still be necrotic as far as the surface of the wound is concerned. Unfortunately there is no significant improvement at this point compared to last week they were not able to get the Santyl that are using Medihoney and maybe one reason why. The other is I think this may be stained to  drive. Possibly adding something such as mepitel over top of the Medihoney may help to loosen this up quite a bit which I think would be in her best interest. Also she still has a lot of erythema and is very tender to touch I'm afraid that we may need to switch to a different antibiotic to see if this will be of benefit there still really nothing that I can culture to get a better picture of what's going on and what we need to do from the standpoint of antibiotics. I do believe the patient needs a referral Talmuds me to vascular in order to evaluate her blood flow based on what I'm seeing they will be able to perform TBI testing if nothing else along with the vascular evaluation to see if there's any evidence for limited blood flow to this lower extremity. 4/8; I have reviewed the patient's arterial studies and they are really quite good. There should be no arterial impediments to healing the wound on the right anterior tibial area. 4/15; patient has a small open area over the left mid tibial area. Still requiring debridement we are using Santyl here and Medihoney at home [Santyl unaffordable] 4/22; difficult, presumably traumatic area over the left mid tibial area in the setting of chronic venous insufficiency. We have been using Santyl here and meta honey at home. The wound surface is getting gradually better and slight improvement in dimensions. Her husband is changing this daily 4/29; left mid tibia presumably traumatic wound in the setting of chronic venous insufficiency. We have been using Santyl and a combination of Medihoney at home. Arrives today with a much better looking healthy granulated surface. We will change the primary dressing to [facility] 5/6; left mid tibia smaller reasonably healthy looking wound using Hydrofera Blue since last week 5/20-Patient returns to clinic with a left mid tibial wound looking better, we have been using Hydrofera Blue since the previous week 5/27;  patient returns to clinic with a left mid tibial wound covered in raised eschar. We have been using Hydrofera Blue her husband changes the dressings using a border foam cover READMISSION 01/28/20 Mrs. Quillen is a patient who is now 86 years old. As usual she comes in with her husband who is her primary caregiver. They were here last from March through May 2020 with a traumatic wound on the left anterior tibia we eventually heal this out. She has chronic venous insufficiency. She is also here previously in 2018. Her current problem started 8 days ago. She went in the hospital for a thoracic level kyphoplasty. The procedure she developed a skin tear on her leg. This was mentioned to her. Her husband is applying Medihoney to the area and they are in for our review of this. She finds this much too uncomfortable to attempt ABIs although her ABIs when she was in the clinic last year were quite normal bilaterally Aldaco, Evalee C. ([id number]) Past medical history she is not a diabetic. As noted she  had a T9 and T6 kyphoplasty, Alzheimer's disease, chronic atrial fibrillation on Coumadin and hypertension 4/14; patient arrives in clinic having removed part of the wrap. We attempted to leave this on all week. Unfortunately none that none of the tissue that looks semiviable last week has remained viable. She required an extensive debridement. We have been using silver alginate 4/21; the patient arrived today with tremendous erythema around the large part of the wound on the right lateral leg. This extends medially and down towards her foot. I marked the area but I think the extent of the likely cellulitis is beyond what can be safely managed as an outpatient. 02/23/2020 upon evaluation today patient is seen today though she is typically followed by Dr. Dellia Nims. Last time he saw her he sent her to the hospital and she subsequently was in the hospital for some time. In fact it was from 02/11/2020 through 02/19/2020.  During that time she was on antibiotic therapy by way of IV antibiotics. Her leg does appear to be doing better today compared to what we saw then. Nonetheless she had a culture that was obtained while she was in the hospital by Dr. Steva Ready. This ended up not growing anything which is not really surprising considering she was on antibiotics which were fairly strong at the time of culture. There was no growth at all after 2 days. With that being said Dr. Steva Ready apparently discussed with Dr. Dellia Nims per her note anyway that she wanted the patient off of the antibiotics for a week and then to repeat a culture in the outpatient setting. She also has a suspicion for the possibility of pyoderma and has referred the patient to Dr. Nehemiah Massed and the patient is supposed to be seeing him sometime I believe Thursday of this week. Nonetheless I explained that pyoderma can sometimes begin as a injury or insult that then worsens. Obviously the initial injury here appears to have been a scraping of her leg that occurred when the patient was being transferred off of the table after a kyphoplasty procedure. Fortunately again as mentioned the infection seems to be doing significantly better based on what I am seeing at this point compared to previous pictures that I reviewed today. 5/5; the patient is back to my clinic today after seeing with stone 2 days ago. She is on [employer] with a Kerlix Coban I have reviewed her recent history. We had sent her to the hospital with presumed cellulitis considerable erythema in the lateral leg. She received prolonged courses of antibiotics in the hospital. I received a call from one of the hospitalist to report that he didn't feel that she was responding to antibiotics although as noted 2 days ago by Jeri Cos the wound actually looks a lot better along with the periwound skin which is not nearly as erythematous. He raised the possibility of pyoderma gangrenosum which  also had been brought up by Dr. Steva Ready the infectious disease consultant. The discharge culture was negative but she was still on antibiotics The pyoderma gangrenosum apparently has something to do with the fact that both her sons have Crohn's disease although the patient does not have Crohn's disease. I told the doctor that call me I didn't really think this fit with the history which was a trauma induced wound why the patient was having a kyphoplasty. In fact her husband is still very angry about this with the hospital. She also has severe chronic venous insufficiency with hemosiderin and stasis dermatitis and has  had wounds in both legs. 5/12; venous insufficiency wound on the right lower lateral lower leg initially trauma. Still debris on the surface of this wound that undergoes a difficult debridement. We have been using Hydrofera Blue 5/19; venous insufficiency wound on the right lower lateral leg initially trauma. Better looking wound surface and a rim of epithelialization. Surface area is smaller we have been using Hydrofera Blue under compression Objective Constitutional Vitals Time Taken: 2:50 PM, Height: 62 in, Weight: 98 lbs, BMI: 17.9, Temperature: 98.1 F, Pulse: 95 bpm, Respiratory Rate: 16 breaths/min, Blood Pressure: 128/78 mmHg. Integumentary (Hair, Skin) Wound #3 status is Open. Original cause of wound was Trauma. The wound is located on the Right,Lateral Lower Leg. The wound measures 3.4cm length x 2.9cm width x 0.1cm depth; 7.744cm^2 area and 0.774cm^3 volume. There is Fat Layer (Subcutaneous Tissue) Exposed exposed. There is no tunneling or undermining noted. There is a medium amount of purulent drainage noted. The wound margin is flat and intact. There is large (67-100%) pink granulation within the wound bed. There is a small (1-33%) amount of necrotic tissue within the wound bed including Adherent Slough. Assessment Active Problems ICD-10 Laceration without foreign  body, right lower leg, subsequent encounter Non-pressure chronic ulcer of other part of right lower leg with fat layer exposed Chronic venous hypertension (idiopathic) with inflammation of right lower extremity Ledwell, Chantilly C. ([id number]) Procedures Wound #3 Pre-procedure diagnosis of Wound #3 is a Venous Leg Ulcer located on the Right,Lateral Lower Leg .Severity of Tissue Pre Debridement is: Fat layer exposed. There was a Excisional Skin/Subcutaneous Tissue Debridement with a total area of 9.86 sq cm performed by Ricard Dillon, MD. With the following instrument(s): Curette to remove Viable tissue/material. Material removed includes Subcutaneous Tissue and Slough and after achieving pain control using Lidocaine. No specimens were taken. A time out was conducted at 15:30, prior to the start of the procedure. A Moderate amount of bleeding was controlled with Pressure. The procedure was tolerated well. Post Debridement Measurements: 3.4cm length x 2.9cm width x 0.3cm depth; 2.323cm^3 volume. Character of Wound/Ulcer Post Debridement is stable. Severity of Tissue Post Debridement is: Fat layer exposed. Post procedure Diagnosis Wound #3: Same as Pre-Procedure Plan Wound Cleansing: Wound #3 Right,Lateral Lower Leg: Cleanse wound with mild soap and water Anesthetic (add to Medication List): Wound #3 Right,Lateral Lower Leg: Topical Lidocaine 4% cream applied to wound bed prior to debridement (In Clinic Only). Primary Wound Dressing: Santyl Ointment - under Hydrafera Blue Hydrafera Blue Ready Transfer Secondary Dressing: Wound #3 Right,Lateral Lower Leg: ABD pad Kerlix and Coban Dressing Change Frequency: Wound #3 Right,Lateral Lower Leg: Change Dressing Monday, Wednesday, Friday Follow-up Appointments: Wound #3 Right,Lateral Lower Leg: Return Appointment in 1 week. Edema Control: Wound #3 Right,Lateral Lower Leg: Kerlix and Coban - Right Lower Extremity Elevate legs to the level  of the heart and pump ankles as often as possible Additional Orders / Instructions: Wound #3 Right,Lateral Lower Leg: Increase protein intake. Home Health: Wound #3 Right,Lateral Lower Leg: Continue Home Health Visits - Monday and Friday (Wednesdays in clinic) [place] Nurse may visit PRN to address patient s wound care needs. FACE TO FACE ENCOUNTER: MEDICARE and MEDICAID PATIENTS: I certify that this patient is under my care and that I had a face-to-face encounter that meets the physician face-to-face encounter requirements with this patient on this date. The encounter with the patient was in whole or in part for the following MEDICAL CONDITION: (primary reason for [place]) MEDICAL NECESSITY:  I certify, that based on my findings, NURSING services are a medically necessary home health service. HOME BOUND STATUS: I certify that my clinical findings support that this patient is homebound (i.e., Due to illness or injury, pt requires aid of supportive devices such as crutches, cane, wheelchairs, walkers, the use of special transportation or the assistance of another person to leave their place of residence. There is a normal inability to leave the home and doing so requires considerable and taxing effort. Other absences are for medical reasons / religious services and are infrequent or of short duration when for other reasons). If current dressing causes regression in wound condition, may D/C ordered dressing product/s and apply Normal Saline Moist Dressing daily until next [place] / Other MD appointment. [place] of regression in wound condition at [phone]. Please direct any NON-WOUND related issues/requests for orders to patient's Primary Care Physician 1. Santyl ointment under Hydrofera Blue 2. She appears to be making progress surface area is better overall condition of the wound bed appears better with healthy granulation. Still some debridement  required however hopefully that will stop soon. Electronic Signature(s) Signed: 03/10/2020 5:06:12 PM By: Linton Ham MD Entered By: Linton Ham on 03/10/2020 16:14:17 [name], [name] ([id number]) -------------------------------------------------------------------------------- SuperBill Details [name] Date of Service: 03/10/2020 Medical Record Number: [id number] Patient Account Number: [id number] Date of Birth/Sex: 1926/10/17 (86 y.o. F) Treating RN: Cornell Barman Primary Care Provider: Derinda Late Other Clinician: Referring Provider: BABAOFF, MARCUS Treating Provider/Extender: Ricard Dillon Weeks in Treatment: 6 Diagnosis Coding ICD-10 Codes Code Description S81.811D Laceration without foreign body, right lower leg, subsequent encounter L97.812 Non-pressure chronic ulcer of other part of right lower leg with fat layer exposed I87.321 Chronic venous hypertension (idiopathic) with inflammation of right lower extremity Facility Procedures CPT4 Code: [id number] Description: [id number] - DEB SUBQ TISSUE 20 SQ CM/< Modifier: Quantity: 1 CPT4 Code: Description: ICD-10 Diagnosis Description [id number] Non-pressure chronic ulcer of other part of right lower leg with fat lay Modifier: er exposed Quantity: Physician Procedures CPT4 Code: [id number] Description: 11042 - WC PHYS SUBQ TISS 20 SQ CM Modifier: Quantity: 1 CPT4 Code: Description: ICD-10 Diagnosis Description [id number] Non-pressure chronic ulcer of other part of right lower leg with fat lay Modifier: er exposed Quantity: Electronic Signature(s) Signed: 03/10/2020 5:06:12 PM By: Linton Ham MD Entered By: Linton Ham on 03/10/2020 16:14:36

## 2020-03-11 NOTE — Progress Notes (Signed)
[name], [name] ([id number]) Visit Report for 03/10/2020 Arrival Information Details [name], [name]. Date of Service: 03/10/2020 2:45 PM Medical Record Number: [id number] Patient Account Number: [id number] Date of Birth/Sex: 1925-12-03 (86 y.o. F) Treating RN: [name] Primary Care [name]: [name] Other Clinician: Referring [name]: [name] Treating [name]/Extender: [name] in [name]: 6 Visit Information History Since Last Visit Added or deleted any medications: No Patient Arrived: Ambulatory Any new allergies or adverse reactions: No Arrival Time: 14:52 Had a fall or experienced change in No Accompanied By: husband activities of daily living that may affect Transfer Assistance: None risk of falls: Patient Identification Verified: Yes Signs or symptoms of abuse/neglect since last visito No Secondary Verification Process Completed: Yes Hospitalized since last visit: No Patient Has Alerts: Yes Implantable device outside of the clinic excluding No Patient Alerts: Patient on Blood Thinner cellular tissue based products placed in the center warfarin since last visit: Has Dressing in Place as Prescribed: Yes Pain Present Now: No Electronic Signature(s) Signed: 03/10/2020 4:33:32 PM By: [name] RCP, RRT, CHT Entered By: [name] on 03/10/2020 14:53:01 Remer, [name] ([id number]) -------------------------------------------------------------------------------- Encounter Discharge Information Details [name]. Date of Service: 03/10/2020 2:45 PM Medical Record Number: [id number] Patient Account Number: [id number] Date of Birth/Sex: 10-09-1926 (86 y.o. F) Treating RN: [name] Primary Care [name]: [name] Other Clinician: Referring [name]: [name] Treating Jakiah Goree/Extender: [name] in [name]: 6 Encounter Discharge  Information Items Post Procedure Vitals Discharge Condition: Stable Temperature (F): 98.1 Ambulatory Status: Ambulatory Pulse (bpm): 95 Discharge Destination: Home Respiratory Rate (breaths/min): 16 Transportation: Private Auto Blood Pressure (mmHg): 128/78 Accompanied By: spouse Schedule Follow-up Appointment: Yes Clinical Summary of Care: Electronic Signature(s) Signed: 03/11/2020 9:48:50 AM By: [name], BSN, RN, [name], Kim RN, BSN Entered By: [name], BSN, RN, [name], [name] on 03/10/2020 15:38:19 [name] ([id number]) -------------------------------------------------------------------------------- Lower Extremity Assessment Details [name], [name] Date of Service: 03/10/2020 2:45 PM Medical Record Number: [id number] Patient Account Number: [id number] Date of Birth/Sex: 09/06/1926 (86 y.o. F) Treating RN: [name] Primary Care Sakari Raisanen: [name] Other Clinician: Referring [name]: [name] Treating [name]/Extender: [name]: 6 Edema Assessment Assessed: [Left: No] [Right: No] Edema: [Left: Ye] [Right: s] Calf Left: Right: Point of Measurement: 30 cm From Medial Instep cm 27.5 cm Ankle Left: Right: Point of Measurement: 10 cm From Medial Instep cm 20 cm Vascular Assessment Pulses: Dorsalis Pedis Palpable: [Right:Yes] Electronic Signature(s) Signed: 03/10/2020 4:52:06 PM By: [name] Entered By: [name] on 03/10/2020 15:01:46 [name] ([id number]) -------------------------------------------------------------------------------- Multi Wound Chart Details [name]. Date of Service: 03/10/2020 2:45 PM Medical Record Number: [id number] Patient Account Number: [id number] Date of Birth/Sex: [birth year] (86 y.o. F) Treating RN: [name] Primary Care [name]: [name] Other Clinician: Referring [name]: [name] Treating [name]/Extender: [name]: 6 Vital Signs Height(in): 62 Pulse(bpm): 95 Weight(lbs): 98 Blood Pressure(mmHg): 128/78 Body Mass Index(BMI): 18 Temperature(F): 98.1 Respiratory Rate(breaths/min): 16 Photos: [N/A:N/A] Wound Location: Right, Lateral Lower Leg N/A N/A Wounding Event: Trauma N/A N/A Primary Etiology: Venous Leg Ulcer N/A N/A Secondary Etiology: Trauma, Other N/A N/A Comorbid History: Cataracts, Deep Vein Thrombosis, N/A N/A Hypertension, Osteoarthritis, Dementia Date Acquired: 01/20/2020 N/A N/A Weeks of [name]: 6 N/A N/A Wound Status: Open N/A N/A Measurements L x W x D (cm) 3.4x2.9x0.1 N/A N/A Area (cm) : 7.744 N/A N/A Volume (cm) : 0.774  N/A N/A % Reduction in Area: 45.20% N/A N/A % Reduction in Volume: 72.60% N/A N/A Classification: Full Thickness Without Exposed N/A N/A Support Structures Exudate Amount: Medium N/A N/A Exudate Type: Purulent N/A N/A Exudate Color: yellow, brown, green N/A N/A Wound Margin: Flat and Intact N/A N/A Granulation Amount: Large (67-100%) N/A N/A Granulation Quality: Pink N/A N/A Necrotic Amount: Small (1-33%) N/A N/A Exposed Structures: Fat Layer (Subcutaneous Tissue) N/A N/A Exposed: Yes Fascia: No Tendon: No Muscle: No Joint: No Bone: No Epithelialization: None N/A N/A Debridement: Debridement - Excisional N/A N/A Pre-procedure Verification/Time 15:30 N/A N/A Out Taken: Pain Control: Lidocaine N/A N/A Tissue Debrided: Subcutaneous, Slough N/A N/A Level: Skin/Subcutaneous Tissue N/A N/A Debridement Area (sq cm): 9.86 N/A N/A Instrument: Curette N/A N/A Bleeding: Moderate N/A N/A Hemostasis Achieved: Pressure N/A N/A [name], CAVIL. ([id number]) Debridement [name] Procedure was tolerated well N/A N/A Response: Post Debridement 3.4x2.9x0.3 N/A N/A Measurements L x W x D (cm) Post Debridement Volume: 2.323 N/A N/A (cm) Procedures Performed: Debridement N/A N/A [name] Notes Wound #3 (Right, Lateral  Lower Leg) Notes Santyl, hblue, ABD, kerlix, coban Electronic Signature(s) Signed: 03/10/2020 5:06:12 PM By: Linton Ham MD Entered By: Linton Ham on 03/10/2020 16:11:40 [name], [name] ([id number]) -------------------------------------------------------------------------------- Multi-Disciplinary Care Plan Details [name]. Date of Service: 03/10/2020 2:45 PM Medical Record Number: [id number] Patient Account Number: [id number] Date of Birth/Sex: 1926/07/16 (86 y.o. F) Treating RN: [name] Primary Care Francisca Harbuck: [name] Other Clinician: Referring Mitesh Rosendahl: [name] Treating Alyan Hartline/Extender: [name] in [name]: 6 Active Inactive Necrotic Tissue Nursing Diagnoses: Impaired tissue integrity related to necrotic/devitalized tissue Goals: Necrotic/devitalized tissue will be minimized in the wound bed Date Initiated: 01/28/2020 Target Resolution Date: 02/04/2020 Goal Status: Active Interventions: Assess patient pain level pre-, during and post procedure and prior to discharge [name] Activities: Apply topical anesthetic as ordered : 01/28/2020 Notes: Orientation to the Wound Care Program Nursing Diagnoses: Knowledge deficit related to the wound healing center program Goals: Patient/caregiver will verbalize understanding of the [place] Date Initiated: 01/28/2020 Target Resolution Date: 02/04/2020 Goal Status: Active Interventions: Provide education on orientation to the wound center Notes: Soft Tissue Infection Nursing Diagnoses: Impaired tissue integrity Goals: Patient will remain free of wound infection Date Initiated: 01/28/2020 Target Resolution Date: 02/11/2020 Goal Status: Active Interventions: Assess signs and symptoms of infection every visit Notes: Wound/Skin Impairment Nursing Diagnoses: Impaired tissue integrity Goals: Ulcer/skin breakdown will have a volume reduction of 30% by  week 4 ELEYNA, LISI ([id number]) Date Initiated: 01/28/2020 Target Resolution Date: 02/27/2020 Goal Status: Active Interventions: Assess ulceration(s) every visit [name] Activities: Skin care regimen initiated : 01/28/2020 Topical wound management initiated : 01/28/2020 Notes: Electronic Signature(s) Signed: 03/11/2020 9:48:50 AM By: [name], BSN, RN, [name], Kim RN, BSN Entered By: [name], BSN, RN, [name], [name] on 03/10/2020 15:33:28 [name] ([id number]) -------------------------------------------------------------------------------- Pain Assessment Details [name]. Date of Service: 03/10/2020 2:45 PM Medical Record Number: [id number] Patient Account Number: [id number] Date of Birth/Sex: [birth year] (86 y.o. F) Treating RN: [name] Primary Care Otelia Hettinger: [name] Other Clinician: Referring Shuntay Everetts: [name] Treating Matheus Spiker/Extender: [name]: 6 Active Problems Location of Pain Severity and Description of Pain Patient Has Paino Yes Site Locations Pain Location: Pain in Ulcers With Dressing Change: Yes Duration of the Pain. Constant / Intermittento Constant Pain Management and Medication Current Pain Management: Electronic Signature(s) Signed: 03/10/2020 4:52:06 PM By: [name] Entered By: [name] on 03/10/2020  15:01:26 [name], [name] ([id number]) -------------------------------------------------------------------------------- Patient/Caregiver Education Details [name], BIRKELAND. Date of Service: 03/10/2020 2:45 PM Medical Record Number: [id number] Patient Account Number: [id number] Date of Birth/Gender: June 16, 1926 (86 y.o. F) Treating RN: [name] Primary Care Physician: [name] Other Clinician: Referring Physician: BABAOFF, [name] Treating Physician/Extender: [name] in [name]: 6 Education Assessment Education Provided  To: Patient Education Topics Provided Wound/Skin Impairment: Handouts: Caring for Your Ulcer Methods: Demonstration, Explain/Verbal Responses: State content correctly Electronic Signature(s) Signed: 03/11/2020 9:48:50 AM By: [name], BSN, RN, [name], Kim RN, BSN Entered By: [name], BSN, RN, [name], [name] on 03/10/2020 15:37:13 [name] ([id number]) -------------------------------------------------------------------------------- Wound Assessment Details [name], [name]. Date of Service: 03/10/2020 2:45 PM Medical Record Number: [id number] Patient Account Number: [id number] Date of Birth/Sex: 05/27/1926 (86 y.o. F) Treating RN: [name] Primary Care Jeanetta Alonzo: [name] Other Clinician: Referring Sheretta Grumbine: [name] Treating Hammond Obeirne/Extender: [name]: 6 Wound Status Wound Number: 3 Primary Venous Leg Ulcer Etiology: Wound Location: Right, Lateral Lower Leg Secondary Trauma, Other Wounding Event: Trauma Etiology: Date Acquired: 01/20/2020 Wound Status: Open Weeks Of [name]: 6 Comorbid Cataracts, Deep Vein Thrombosis, Hypertension, Clustered Wound: No History: Osteoarthritis, Dementia Photos Wound Measurements Length: (cm) 3.4 Width: (cm) 2.9 Depth: (cm) 0.1 Area: (cm) 7.744 Volume: (cm) 0.774 % Reduction in Area: 45.2% % Reduction in Volume: 72.6% Epithelialization: None Tunneling: No Undermining: No Wound Description Classification: Full Thickness Without Exposed Support Structu Wound Margin: Flat and Intact Exudate Amount: Medium Exudate Type: Purulent Exudate Color: yellow, brown, green res Foul Odor After Cleansing: No Slough/Fibrino Yes Wound Bed Granulation Amount: Large (67-100%) Exposed Structure Granulation Quality: Pink Fascia Exposed: No Necrotic Amount: Small (1-33%) Fat Layer (Subcutaneous Tissue) Exposed: Yes Necrotic Quality: Adherent Slough Tendon Exposed: No Muscle Exposed:  No Joint Exposed: No Bone Exposed: No [name] Notes Wound #3 (Right, Lateral Lower Leg) Notes Santyl, hblue, ABD, kerlix, coban Electronic Signature(s) [name], [name] ([id number]) Signed: 03/10/2020 4:52:06 PM By: [name] Entered By: [name] on 03/10/2020 15:06:02 [name] ([id number]) -------------------------------------------------------------------------------- Vitals Details [name]. Date of Service: 03/10/2020 2:45 PM Medical Record Number: [id number] Patient Account Number: [id number] Date of Birth/Sex: 01/31/1926 (86 y.o. F) Treating RN: [name] Primary Care Maui Britten: [name] Other Clinician: Referring Latoy Labriola: [name] Treating Celsey Asselin/Extender: [name]: 6 Vital Signs Time Taken: 14:50 Temperature (F): 98.1 Height (in): 62 Pulse (bpm): 95 Weight (lbs): 98 Respiratory Rate (breaths/min): 16 Body Mass Index (BMI): 17.9 Blood Pressure (mmHg): 128/78 Reference Range: 80 - 120 mg / dl Electronic Signature(s) Signed: 03/10/2020 4:33:32 PM By: [name] RCP, RRT, CHT Entered By: [name] on 03/10/2020 14:53:28

## 2020-03-17 ENCOUNTER — Other Ambulatory Visit: Payer: Self-pay

## 2020-03-17 ENCOUNTER — Encounter: Payer: Medicare Other | Admitting: Internal Medicine

## 2020-03-17 DIAGNOSIS — S81811D Laceration without foreign body, right lower leg, subsequent encounter: Secondary | ICD-10-CM | POA: Diagnosis not present

## 2020-03-17 NOTE — Progress Notes (Addendum)
LANELL, COBBLER ([id number]) Visit Report for 03/17/2020 HPI Details [name], [name]. Date of Service: 03/17/2020 2:00 PM Medical Record Number: [id number] Patient Account Number: [id number] Date of Birth/Sex: 1926-08-10 (86 y.o. F) Treating RN: Cornell Barman Primary Care Provider: Derinda Late Other Clinician: Referring Provider: Derinda Late Treating Provider/Extender: Beverly Gust in Treatment: 7 History of Present Illness HPI Description: 06/06/17 on evaluation today patient presents for evaluation concerning an ulcer which she initially had arise as a result of striking her left lower extremity money bed frame. With that being said this was roughly one month ago and unfortunately the wound despite two courses of Keflex have not really made a dramatic improvement. This has continued to drain as well as being exquisitely tender at times. Even to the point that she is having a difficult time walking. Patient does have chronic atrial fibrillation which subsequently leads to her being on long-term anticoagulant therapy and this causes ED bruising which may have contributed to this entry and where things are at this point. She does have valvular heart disease as well as hypertension and her last INR was 2.7. Currently Neosporin, Gauls, and an ace wrap has been utilized. Keflex is the antibiotic that she has been on. Patient has not had a culture up to this point and she states that her blood pressure at the primary care office yesterday was 138/72. Her pain is significant related to be an eight out of 10. No fevers, chills, nausea, or vomiting noted at this time. 06/20/17; patient was admitted here 2 weeks ago. She had a traumatic wound on her left lateral lower leg probably in the setting of some degree of venous insufficiency with surrounding cellulitis. She had been on Keflex, after we saw her she was put on doxycycline which she apparently did not tolerate. Culture  that was done at the time showed Morganella which was resistant to Keflex. Apparently the patient continued to take Keflex after the appointment. She is also on Coumadin. I had tried to change her antibiotic when the Morganella culture was brought to my attention however apparently our staff could not reach the patient to inform them of the antibiotic change 06/26/17; patient has wound on her left lateral lower extremity in the setting of some degree of venous insufficiency. I gave her cefdinir last week and she is completing this. There is no evidence of surrounding infection. Aggressive debridement last week, wound bed looks healthier. We've been using Aquacel. They're traveling in the [place] next week we'll see her back in 2 weeks unless there are problems. 07/10/17; the patient is been using Aquacel Ag her husband is changing this with Kerlix and conform. She still complains of a lot of pain. Apparently with the antibiotics. We gave 2 or 3 weeks ago. Her INR went up to 6, this was managed by her primary physician 07/17/17; she is using Aquacel Ag and a border foam. Still discomfort although dimensions are better. 07/24/17; patient or for review of a trauma wound on her left anterior leg in the setting of some degree of chronic venous insufficiency she has been using Aquacel Ag and a border foam and making progress. 07/31/17; only a small open area of this original significant trauma is still open. Her husband is changing this every second day [Aquacel Ag] Readmission: 01/14/19 on evaluation today patient is that she seen for initial inspection during the office visit today concerning a trauma/skin tear to the left anterior lower extremity. This is roughly the  same region where she was previously treated and years past when she was here in the clinic. Nonetheless upon evaluation today the patient's wound actually showed signs of decent granulation around the edge although unfortunately the skin flap  that was torn back did fold under on itself and the flap itself is dying. She does have some eschar surrounded the edges of the wound at this point. Nonetheless this also was very tender for her. The patient does have a history of hypertension, atrial fibrillation, and long-term use anticoagulant therapy. 01/21/19 on evaluation today patient's wound on the anterior portion of her lower extremity appears to still be necrotic as far as the surface of the wound is concerned. Unfortunately there is no significant improvement at this point compared to last week they were not able to get the Santyl that are using Medihoney and maybe one reason why. The other is I think this may be stained to drive. Possibly adding something such as mepitel over top of the Medihoney may help to loosen this up quite a bit which I think would be in her best interest. Also she still has a lot of erythema and is very tender to touch I'm afraid that we may need to switch to a different antibiotic to see if this will be of benefit there still really nothing that I can culture to get a better picture of what's going on and what we need to do from the standpoint of antibiotics. I do believe the patient needs a referral Talmuds me to vascular in order to evaluate her blood flow based on what I'm seeing they will be able to perform TBI testing if nothing else along with the vascular evaluation to see if there's any evidence for limited blood flow to this lower extremity. 4/8; I have reviewed the patient's arterial studies and they are really quite good. There should be no arterial impediments to healing the wound on the right anterior tibial area. 4/15; patient has a small open area over the left mid tibial area. Still requiring debridement we are using Santyl here and Medihoney at home [Santyl unaffordable] 4/22; difficult, presumably traumatic area over the left mid tibial area in the setting of chronic venous insufficiency. We have  been using Santyl here and meta honey at home. The wound surface is getting gradually better and slight improvement in dimensions. Her husband is changing this daily 4/29; left mid tibia presumably traumatic wound in the setting of chronic venous insufficiency. We have been using Santyl and a combination of Medihoney at home. Arrives today with a much better looking healthy granulated surface. We will change the primary dressing to [facility] 5/6; left mid tibia smaller reasonably healthy looking wound using Hydrofera Blue since last week 5/20-Patient returns to clinic with a left mid tibial wound looking better, we have been using Hydrofera Blue since the previous week 5/27; patient returns to clinic with a left mid tibial wound covered in raised eschar. We have been using Hydrofera Blue her husband changes the dressings using a border foam cover READMISSION 01/28/20 [name] is a patient who is now 86 years old. As usual she comes in with her husband who is her primary caregiver. They were here last from March through May 2020 with a traumatic wound on the left anterior tibia we eventually heal this out. She has chronic venous insufficiency. She is also here previously in 2018. [name], [name] ([id number]) Her current problem started 8 days ago. She went in the  hospital for a thoracic level kyphoplasty. The procedure she developed a skin tear on her leg. This was mentioned to her. Her husband is applying Medihoney to the area and they are in for our review of this. She finds this much too uncomfortable to attempt ABIs although her ABIs when she was in the clinic last year were quite normal bilaterally Past medical history she is not a diabetic. As noted she had a T9 and T6 kyphoplasty, Alzheimer's disease, chronic atrial fibrillation on Coumadin and hypertension 4/14; patient arrives in clinic having removed part of the wrap. We attempted to leave this on all week. Unfortunately none  that none of the tissue that looks semiviable last week has remained viable. She required an extensive debridement. We have been using silver alginate 4/21; the patient arrived today with tremendous erythema around the large part of the wound on the right lateral leg. This extends medially and down towards her foot. I marked the area but I think the extent of the likely cellulitis is beyond what can be safely managed as an outpatient. 02/23/2020 upon evaluation today patient is seen today though she is typically followed by Dr. Dellia Nims. Last time he saw her he sent her to the hospital and she subsequently was in the hospital for some time. In fact it was from 02/11/2020 through 02/19/2020. During that time she was on antibiotic therapy by way of IV antibiotics. Her leg does appear to be doing better today compared to what we saw then. Nonetheless she had a culture that was obtained while she was in the hospital by Dr. Steva Ready. This ended up not growing anything which is not really surprising considering she was on antibiotics which were fairly strong at the time of culture. There was no growth at all after 2 days. With that being said Dr. Steva Ready apparently discussed with Dr. Dellia Nims per her note anyway that she wanted the patient off of the antibiotics for a week and then to repeat a culture in the outpatient setting. She also has a suspicion for the possibility of pyoderma and has referred the patient to Dr. Nehemiah Massed and the patient is supposed to be seeing him sometime I believe Thursday of this week. Nonetheless I explained that pyoderma can sometimes begin as a injury or insult that then worsens. Obviously the initial injury here appears to have been a scraping of her leg that occurred when the patient was being transferred off of the table after a kyphoplasty procedure. Fortunately again as mentioned the infection seems to be doing significantly better based on what I am seeing at this point  compared to previous pictures that I reviewed today. 5/5; the patient is back to my clinic today after seeing with stone 2 days ago. She is on [employer] with a Kerlix Coban I have reviewed her recent history. We had sent her to the hospital with presumed cellulitis considerable erythema in the lateral leg. She received prolonged courses of antibiotics in the hospital. I received a call from one of the hospitalist to report that he didn't feel that she was responding to antibiotics although as noted 2 days ago by Jeri Cos the wound actually looks a lot better along with the periwound skin which is not nearly as erythematous. He raised the possibility of pyoderma gangrenosum which also had been brought up by Dr. Steva Ready the infectious disease consultant. The discharge culture was negative but she was still on antibiotics The pyoderma gangrenosum apparently has something to do with  the fact that both her sons have Crohn's disease although the patient does not have Crohn's disease. I told the doctor that call me I didn't really think this fit with the history which was a trauma induced wound why the patient was having a kyphoplasty. In fact her husband is still very angry about this with the hospital. She also has severe chronic venous insufficiency with hemosiderin and stasis dermatitis and has had wounds in both legs. 5/12; venous insufficiency wound on the right lower lateral lower leg initially trauma. Still debris on the surface of this wound that undergoes a difficult debridement. We have been using Hydrofera Blue 5/19; venous insufficiency wound on the right lower lateral leg initially trauma. Better looking wound surface and a rim of epithelialization. Surface area is smaller we have been using Hydrofera Blue under compression 03/17/20-Patient returns at 1 week, the wound is looking stable, dimensions are slightly smaller, continue Hydrofera Blue and Santyl under compression Electronic  Signature(s) Signed: 03/17/2020 2:36:34 PM By: Tobi Bastos MD, MBA Entered By: Tobi Bastos on 03/17/2020 14:36:34 [name] ([id number]) -------------------------------------------------------------------------------- Physical Exam Details [name]. Date of Service: 03/17/2020 2:00 PM Medical Record Number: [id number] Patient Account Number: [id number] Date of Birth/Sex: 1926-04-15 (86 y.o. F) Treating RN: Cornell Barman Primary Care Provider: Derinda Late Other Clinician: Referring Provider: BABAOFF, MARCUS Treating Provider/Extender: Beverly Gust in Treatment: 7 Constitutional alert and oriented x 3. sitting or standing blood pressure is within target range for patient.. supine blood pressure is within target range for patient.. pulse regular and within target range for patient.Marland Kitchen respirations regular, non-labored and within target range for patient.Marland Kitchen temperature within target range for patient.. . . Well-nourished and well-hydrated in no acute distress. Notes Right anterior leg wound looks good with healthy granulation tissue, surrounding skin intact Electronic Signature(s) Signed: 03/17/2020 2:37:31 PM By: Tobi Bastos MD, MBA Entered By: Tobi Bastos on 03/17/2020 14:37:30 [name] ([id number]) -------------------------------------------------------------------------------- Physician Orders Details [name]. Date of Service: 03/17/2020 2:00 PM Medical Record Number: [id number] Patient Account Number: [id number] Date of Birth/Sex: 01-07-1926 (86 y.o. F) Treating RN: Cornell Barman Primary Care Provider: Derinda Late Other Clinician: Referring Provider: BABAOFF, MARCUS Treating Provider/Extender: Beverly Gust in Treatment: 7 Verbal / Phone Orders: No Diagnosis Coding Wound Cleansing Wound #3 Right,Lateral Lower Leg o Cleanse wound with mild soap and water Anesthetic (add to Medication  List) Wound #3 Right,Lateral Lower Leg o Topical Lidocaine 4% cream applied to wound bed prior to debridement (In Clinic Only). Primary Wound Dressing o Santyl Ointment - under Hydrafera Blue o Hydrafera Blue Ready Transfer Secondary Dressing Wound #3 Right,Lateral Lower Leg o ABD pad o Kerlix and Coban Dressing Change Frequency Wound #3 Right,Lateral Lower Leg o Change Dressing Monday, Wednesday, Friday Follow-up Appointments Wound #3 Right,Lateral Lower Leg o Return Appointment in 1 week. Edema Control Wound #3 Right,Lateral Lower Leg o Kerlix and Coban - Right Lower Extremity o Elevate legs to the level of the heart and pump ankles as often as possible Additional Orders / Instructions Wound #3 Right,Lateral Lower Leg o Increase protein intake. Home Health Wound #3 [place] Visits - Monday and Friday (Wednesdays in clinic) [facility] Nurse may visit PRN to address patientos wound care needs. o FACE TO FACE ENCOUNTER: MEDICARE and MEDICAID PATIENTS: I certify that this patient is under my care and that I had a face-to-face encounter that meets the physician face-to-face encounter requirements with  this patient on this date. The encounter with the patient was in whole or in part for the following MEDICAL CONDITION: (primary reason for [place]) MEDICAL NECESSITY: I certify, that based on my findings, NURSING services are a medically necessary home health service. HOME BOUND STATUS: I certify that my clinical findings support that this patient is homebound (i.e., Due to illness or injury, pt requires aid of supportive devices such as crutches, cane, wheelchairs, walkers, the use of special transportation or the assistance of another person to leave their place of residence. There is a normal inability to leave the home and doing so requires considerable and taxing effort. Other absences are for medical reasons  / religious services and are infrequent or of short duration when for other reasons). o If current dressing causes regression in wound condition, may D/C ordered dressing product/s and apply Normal Saline Moist Dressing daily until next [place] / Other MD appointment. [employer] of regression in wound condition at [phone]. o Please direct any NON-WOUND related issues/requests for orders to patient's Primary Care Physician [name], [name] ([id number]) Electronic Signature(s) Signed: 03/17/2020 3:57:01 PM By: Tobi Bastos MD, MBA Signed: 03/17/2020 5:27:22 PM By: Gretta Cool, BSN, RN, [name], Kim RN, BSN Entered By: Gretta Cool, BSN, RN, [name], Kim on 03/17/2020 14:37:18 [name] ([id number]) -------------------------------------------------------------------------------- Progress Note Details [name], [name]. Date of Service: 03/17/2020 2:00 PM Medical Record Number: [id number] Patient Account Number: [id number] Date of Birth/Sex: 02/14/1926 (86 y.o. F) Treating RN: Cornell Barman Primary Care Provider: Derinda Late Other Clinician: Referring Provider: Derinda Late Treating Provider/Extender: Beverly Gust in Treatment: 7 Subjective History of Present Illness (HPI) 06/06/17 on evaluation today patient presents for evaluation concerning an ulcer which she initially had arise as a result of striking her left lower extremity money bed frame. With that being said this was roughly one month ago and unfortunately the wound despite two courses of Keflex have not really made a dramatic improvement. This has continued to drain as well as being exquisitely tender at times. Even to the point that she is having a difficult time walking. Patient does have chronic atrial fibrillation which subsequently leads to her being on long-term anticoagulant therapy and this causes ED bruising which may have contributed to this entry and where things are at  this point. She does have valvular heart disease as well as hypertension and her last INR was 2.7. Currently Neosporin, Gauls, and an ace wrap has been utilized. Keflex is the antibiotic that she has been on. Patient has not had a culture up to this point and she states that her blood pressure at the primary care office yesterday was 138/72. Her pain is significant related to be an eight out of 10. No fevers, chills, nausea, or vomiting noted at this time. 06/20/17; patient was admitted here 2 weeks ago. She had a traumatic wound on her left lateral lower leg probably in the setting of some degree of venous insufficiency with surrounding cellulitis. She had been on Keflex, after we saw her she was put on doxycycline which she apparently did not tolerate. Culture that was done at the time showed Morganella which was resistant to Keflex. Apparently the patient continued to take Keflex after the appointment. She is also on Coumadin. I had tried to change her antibiotic when the Morganella culture was brought to my attention however apparently our staff could not reach the patient to inform them of the antibiotic change 06/26/17; patient has  wound on her left lateral lower extremity in the setting of some degree of venous insufficiency. I gave her cefdinir last week and she is completing this. There is no evidence of surrounding infection. Aggressive debridement last week, wound bed looks healthier. We've been using Aquacel. They're traveling in the [place] next week we'll see her back in 2 weeks unless there are problems. 07/10/17; the patient is been using Aquacel Ag her husband is changing this with Kerlix and conform. She still complains of a lot of pain. Apparently with the antibiotics. We gave 2 or 3 weeks ago. Her INR went up to 6, this was managed by her primary physician 07/17/17; she is using Aquacel Ag and a border foam. Still discomfort although dimensions are better. 07/24/17; patient or for  review of a trauma wound on her left anterior leg in the setting of some degree of chronic venous insufficiency she has been using Aquacel Ag and a border foam and making progress. 07/31/17; only a small open area of this original significant trauma is still open. Her husband is changing this every second day [Aquacel Ag] Readmission: 01/14/19 on evaluation today patient is that she seen for initial inspection during the office visit today concerning a trauma/skin tear to the left anterior lower extremity. This is roughly the same region where she was previously treated and years past when she was here in the clinic. Nonetheless upon evaluation today the patient's wound actually showed signs of decent granulation around the edge although unfortunately the skin flap that was torn back did fold under on itself and the flap itself is dying. She does have some eschar surrounded the edges of the wound at this point. Nonetheless this also was very tender for her. The patient does have a history of hypertension, atrial fibrillation, and long-term use anticoagulant therapy. 01/21/19 on evaluation today patient's wound on the anterior portion of her lower extremity appears to still be necrotic as far as the surface of the wound is concerned. Unfortunately there is no significant improvement at this point compared to last week they were not able to get the Santyl that are using Medihoney and maybe one reason why. The other is I think this may be stained to drive. Possibly adding something such as mepitel over top of the Medihoney may help to loosen this up quite a bit which I think would be in her best interest. Also she still has a lot of erythema and is very tender to touch I'm afraid that we may need to switch to a different antibiotic to see if this will be of benefit there still really nothing that I can culture to get a better picture of what's going on and what we need to do from the standpoint of  antibiotics. I do believe the patient needs a referral Talmuds me to vascular in order to evaluate her blood flow based on what I'm seeing they will be able to perform TBI testing if nothing else along with the vascular evaluation to see if there's any evidence for limited blood flow to this lower extremity. 4/8; I have reviewed the patient's arterial studies and they are really quite good. There should be no arterial impediments to healing the wound on the right anterior tibial area. 4/15; patient has a small open area over the left mid tibial area. Still requiring debridement we are using Santyl here and Medihoney at home [Santyl unaffordable] 4/22; difficult, presumably traumatic area over the left mid tibial area in the setting  of chronic venous insufficiency. We have been using Santyl here and meta honey at home. The wound surface is getting gradually better and slight improvement in dimensions. Her husband is changing this daily 4/29; left mid tibia presumably traumatic wound in the setting of chronic venous insufficiency. We have been using Santyl and a combination of Medihoney at home. Arrives today with a much better looking healthy granulated surface. We will change the primary dressing to [facility] 5/6; left mid tibia smaller reasonably healthy looking wound using Hydrofera Blue since last week 5/20-Patient returns to clinic with a left mid tibial wound looking better, we have been using Hydrofera Blue since the previous week 5/27; patient returns to clinic with a left mid tibial wound covered in raised eschar. We have been using Hydrofera Blue her husband changes the dressings using a border foam cover READMISSION 01/28/20 [name] is a patient who is now 86 years old. As usual she comes in with her husband who is her primary caregiver. They were here last from March through May 2020 with a traumatic wound on the left anterior tibia we eventually heal this out. She has chronic  venous insufficiency. She is also here previously in 2018. Her current problem started 8 days ago. She went in the hospital for a thoracic level kyphoplasty. The procedure she developed a skin tear on her leg. This was mentioned to her. Her husband is applying Medihoney to the area and they are in for our review of this. She finds this much too uncomfortable to attempt ABIs although her ABIs when she was in the clinic last year were quite normal bilaterally [name], [name] ([id number]) Past medical history she is not a diabetic. As noted she had a T9 and T6 kyphoplasty, Alzheimer's disease, chronic atrial fibrillation on Coumadin and hypertension 4/14; patient arrives in clinic having removed part of the wrap. We attempted to leave this on all week. Unfortunately none that none of the tissue that looks semiviable last week has remained viable. She required an extensive debridement. We have been using silver alginate 4/21; the patient arrived today with tremendous erythema around the large part of the wound on the right lateral leg. This extends medially and down towards her foot. I marked the area but I think the extent of the likely cellulitis is beyond what can be safely managed as an outpatient. 02/23/2020 upon evaluation today patient is seen today though she is typically followed by Dr. Dellia Nims. Last time he saw her he sent her to the hospital and she subsequently was in the hospital for some time. In fact it was from 02/11/2020 through 02/19/2020. During that time she was on antibiotic therapy by way of IV antibiotics. Her leg does appear to be doing better today compared to what we saw then. Nonetheless she had a culture that was obtained while she was in the hospital by Dr. Steva Ready. This ended up not growing anything which is not really surprising considering she was on antibiotics which were fairly strong at the time of culture. There was no growth at all after 2 days. With that being said  Dr. Steva Ready apparently discussed with Dr. Dellia Nims per her note anyway that she wanted the patient off of the antibiotics for a week and then to repeat a culture in the outpatient setting. She also has a suspicion for the possibility of pyoderma and has referred the patient to Dr. Nehemiah Massed and the patient is supposed to be seeing him sometime I believe Thursday  of this week. Nonetheless I explained that pyoderma can sometimes begin as a injury or insult that then worsens. Obviously the initial injury here appears to have been a scraping of her leg that occurred when the patient was being transferred off of the table after a kyphoplasty procedure. Fortunately again as mentioned the infection seems to be doing significantly better based on what I am seeing at this point compared to previous pictures that I reviewed today. 5/5; the patient is back to my clinic today after seeing with stone 2 days ago. She is on [employer] with a Kerlix Coban I have reviewed her recent history. We had sent her to the hospital with presumed cellulitis considerable erythema in the lateral leg. She received prolonged courses of antibiotics in the hospital. I received a call from one of the hospitalist to report that he didn't feel that she was responding to antibiotics although as noted 2 days ago by Jeri Cos the wound actually looks a lot better along with the periwound skin which is not nearly as erythematous. He raised the possibility of pyoderma gangrenosum which also had been brought up by Dr. Steva Ready the infectious disease consultant. The discharge culture was negative but she was still on antibiotics The pyoderma gangrenosum apparently has something to do with the fact that both her sons have Crohn's disease although the patient does not have Crohn's disease. I told the doctor that call me I didn't really think this fit with the history which was a trauma induced wound why the patient was having a  kyphoplasty. In fact her husband is still very angry about this with the hospital. She also has severe chronic venous insufficiency with hemosiderin and stasis dermatitis and has had wounds in both legs. 5/12; venous insufficiency wound on the right lower lateral lower leg initially trauma. Still debris on the surface of this wound that undergoes a difficult debridement. We have been using Hydrofera Blue 5/19; venous insufficiency wound on the right lower lateral leg initially trauma. Better looking wound surface and a rim of epithelialization. Surface area is smaller we have been using Hydrofera Blue under compression 03/17/20-Patient returns at 1 week, the wound is looking stable, dimensions are slightly smaller, continue Hydrofera Blue and Santyl under compression Objective Constitutional alert and oriented x 3. sitting or standing blood pressure is within target range for patient.. supine blood pressure is within target range for patient.. pulse regular and within target range for patient.Marland Kitchen respirations regular, non-labored and within target range for patient.Marland Kitchen temperature within target range for patient.. Well-nourished and well-hydrated in no acute distress. Vitals Time Taken: 2:05 PM, Height: 62 in, Weight: 98 lbs, BMI: 17.9, Temperature: 98.0 F, Pulse: 78 bpm, Respiratory Rate: 16 breaths/min, Blood Pressure: 137/75 mmHg. General Notes: Right anterior leg wound looks good with healthy granulation tissue, surrounding skin intact Integumentary (Hair, Skin) Wound #3 status is Open. Original cause of wound was Trauma. The wound is located on the Right,Lateral Lower Leg. The wound measures 3cm length x 3cm width x 0.1cm depth; 7.069cm^2 area and 0.707cm^3 volume. There is Fat Layer (Subcutaneous Tissue) Exposed exposed. There is a medium amount of purulent drainage noted. The wound margin is flat and intact. There is large (67-100%) pink granulation within the wound bed. There is a small  (1-33%) amount of necrotic tissue within the wound bed including Adherent Slough. Plan Wound Cleansing: Wound #3 Right,Lateral Lower Leg: Cleanse wound with mild soap and water Anesthetic (add to Medication List): Wound #3 Right,Lateral Lower  Leg: [name], [name] ([id number]) Topical Lidocaine 4% cream applied to wound bed prior to debridement (In Clinic Only). Primary Wound Dressing: Santyl Ointment - under Hydrafera Blue Hydrafera Blue Ready Transfer Secondary Dressing: Wound #3 Right,Lateral Lower Leg: ABD pad Kerlix and Coban Dressing Change Frequency: Wound #3 Right,Lateral Lower Leg: Change Dressing Monday, Wednesday, Friday Follow-up Appointments: Wound #3 Right,Lateral Lower Leg: Return Appointment in 1 week. Edema Control: Wound #3 Right,Lateral Lower Leg: Kerlix and Coban - Right Lower Extremity Elevate legs to the level of the heart and pump ankles as often as possible Additional Orders / Instructions: Wound #3 Right,Lateral Lower Leg: Increase protein intake. Home Health: Wound #3 Right,Lateral Lower Leg: Continue Home Health Visits - Monday and Friday (Wednesdays in clinic) [place] Nurse may visit PRN to address patient s wound care needs. FACE TO FACE ENCOUNTER: MEDICARE and MEDICAID PATIENTS: I certify that this patient is under my care and that I had a face-to-face encounter that meets the physician face-to-face encounter requirements with this patient on this date. The encounter with the patient was in whole or in part for the following MEDICAL CONDITION: (primary reason for [place]) MEDICAL NECESSITY: I certify, that based on my findings, NURSING services are a medically necessary home health service. HOME BOUND STATUS: I certify that my clinical findings support that this patient is homebound (i.e., Due to illness or injury, pt requires aid of supportive devices such as crutches, cane, wheelchairs, walkers, the use of special transportation or  the assistance of another person to leave their place of residence. There is a normal inability to leave the home and doing so requires considerable and taxing effort. Other absences are for medical reasons / religious services and are infrequent or of short duration when for other reasons). If current dressing causes regression in wound condition, may D/C ordered dressing product/s and apply Normal Saline Moist Dressing daily until next [place] / Other MD appointment. [place] of regression in wound condition at [phone]. Please direct any NON-WOUND related issues/requests for orders to patient's Primary Care Physician -Continue Santyl and Hydrofera Blue under compression -Wound is making adequate progress -Return to clinic next week Electronic Signature(s) Signed: 03/17/2020 2:38:47 PM By: Tobi Bastos MD, MBA Entered By: Tobi Bastos on 03/17/2020 14:38:46 [name], [name] ([id number]) -------------------------------------------------------------------------------- [facility] Details [name]. Date of Service: 03/17/2020 Medical Record Number: [id number] Patient Account Number: [id number] Date of Birth/Sex: Aug 23, 1926 (86 y.o. F) Treating RN: Cornell Barman Primary Care Provider: Derinda Late Other Clinician: Referring Provider: Derinda Late Treating Provider/Extender: Beverly Gust in Treatment: 7 Diagnosis Coding ICD-10 Codes Code Description S81.811D Laceration without foreign body, right lower leg, subsequent encounter L97.812 Non-pressure chronic ulcer of other part of right lower leg with fat layer exposed I87.321 Chronic venous hypertension (idiopathic) with inflammation of right lower extremity Facility Procedures CPT4 Code: [id number] Description: 99213 - WOUND CARE VISIT-LEV 3 EST PT Modifier: Quantity: 1 Physician Procedures CPT4 Code: [id number] Description: 99213 - WC PHYS LEVEL 3 - EST  PT Modifier: Quantity: 1 CPT4 Code: Description: ICD-10 Diagnosis Description [id number] Laceration without foreign body, right lower leg, subsequent encount Modifier: er Quantity: Electronic Signature(s) Signed: 03/17/2020 2:39:27 PM By: Tobi Bastos MD, MBA Entered By: Tobi Bastos on 03/17/2020 14:39:27

## 2020-03-18 NOTE — Progress Notes (Signed)
[name] ([id number]) Visit Report for 03/17/2020 Arrival Information Details [name], [name]. Date of Service: 03/17/2020 2:00 PM Medical Record Number: [id number] Patient Account Number: [id number] Date of Birth/Sex: 04-02-1926 (86 y.o. F) Treating [name]: [name] Primary Care [name]: [name] Other Clinician: Referring [name]: [name] Treating [name]/Extender: [name] in Treatment: 7 Visit Information History Since Last Visit Added or deleted any medications: No Patient Arrived: Ambulatory Any new allergies or adverse reactions: No Arrival Time: 14:05 Had a fall or experienced change in No Accompanied By: husband activities of daily living that may affect Transfer Assistance: None risk of falls: Patient Identification Verified: Yes Signs or symptoms of abuse/neglect since last visito No Secondary Verification Process Completed: Yes Hospitalized since last visit: No Patient Has Alerts: Yes Implantable device outside of the clinic excluding No Patient Alerts: Patient on Blood Thinner cellular tissue based products placed in the center warfarin since last visit: Has Dressing in Place as Prescribed: Yes Has Compression in Place as Prescribed: Yes Pain Present Now: No Electronic Signature(s) Signed: 03/18/2020 11:04:59 AM By: [name] [name] Entered By: [name] on 03/17/2020 14:09:09 [name] ([id number]) -------------------------------------------------------------------------------- Clinic Level of Care Assessment Details [name]. Date of Service: 03/17/2020 2:00 PM Medical Record Number: [id number] Patient Account Number: [id number] Date of Birth/Sex: May 13, 1926 (86 y.o. F) Treating [name]: [name] Primary Care [name]: [name] Other Clinician: Referring [name]: [name] Treating [name]/Extender: [name]  in Treatment: 7 Clinic Level of Care Assessment Items TOOL 4 Quantity Score []  - Use when only an EandM is performed on FOLLOW-UP visit 0 ASSESSMENTS - Nursing Assessment / Reassessment X - Reassessment of Co-morbidities (includes updates in patient status) 1 10 X- 1 5 Reassessment of Adherence to Treatment Plan ASSESSMENTS - Wound and Skin Assessment / Reassessment X - Simple Wound Assessment / Reassessment - one wound 1 5 []  - 0 Complex Wound Assessment / Reassessment - multiple wounds []  - 0 Dermatologic / Skin Assessment (not related to wound area) ASSESSMENTS - Focused Assessment []  - Circumferential Edema Measurements - multi extremities 0 []  - 0 Nutritional Assessment / Counseling / Intervention []  - 0 Lower Extremity Assessment (monofilament, tuning fork, pulses) []  - 0 Peripheral Arterial Disease Assessment (using hand held doppler) ASSESSMENTS - Ostomy and/or Continence Assessment and Care []  - Incontinence Assessment and Management 0 []  - 0 Ostomy Care Assessment and Management (repouching, etc.) PROCESS - Coordination of Care X - Simple Patient / Family Education for ongoing care 1 15 []  - 0 Complex (extensive) Patient / Family Education for ongoing care []  - 0 Staff obtains Programmer, systems, Records, Test Results / Process Orders []  - 0 Staff telephones HHA, Nursing Homes / Clarify orders / etc []  - 0 Routine Transfer to another Facility (non-emergent condition) []  - 0 Routine Hospital Admission (non-emergent condition) []  - 0 New Admissions / Biomedical engineer / Ordering NPWT, Apligraf, etc. []  - 0 Emergency Hospital Admission (emergent condition) X- 1 10 Simple Discharge Coordination []  - 0 Complex (extensive) Discharge Coordination PROCESS - Special Needs []  - Pediatric / Minor Patient Management 0 []  - 0 Isolation Patient Management []  - 0 Hearing / Language / Visual special needs []  - 0 Assessment of Community assistance (transportation, D/C  planning, etc.) []  - 0 Additional assistance / Altered mentation []  - 0 Support Surface(s) Assessment (bed, cushion, seat, etc.) INTERVENTIONS - Wound Cleansing / Measurement [name], [name] ([id number]) X- 1 5  Simple Wound Cleansing - one wound []  - 0 Complex Wound Cleansing - multiple wounds X- 1 5 Wound Imaging (photographs - any number of wounds) []  - 0 Wound Tracing (instead of photographs) X- 1 5 Simple Wound Measurement - one wound []  - 0 Complex Wound Measurement - multiple wounds INTERVENTIONS - Wound Dressings []  - Small Wound Dressing one or multiple wounds 0 X- 1 15 Medium Wound Dressing one or multiple wounds []  - 0 Large Wound Dressing one or multiple wounds []  - 0 Application of Medications - topical []  - 0 Application of Medications - injection INTERVENTIONS - Miscellaneous []  - External ear exam 0 []  - 0 Specimen Collection (cultures, biopsies, blood, body fluids, etc.) []  - 0 Specimen(s) / Culture(s) sent or taken to Lab for analysis []  - 0 Patient Transfer (multiple staff / Civil Service fast streamer / Similar devices) []  - 0 Simple Staple / Suture removal (25 or less) []  - 0 Complex Staple / Suture removal (26 or more) []  - 0 Hypo / Hyperglycemic Management (close monitor of Blood Glucose) []  - 0 Ankle / Brachial Index (ABI) - do not check if billed separately X- 1 5 Vital Signs Has the patient been seen at the hospital within the last three years: Yes Total Score: 80 Level Of Care: New/Established - Level 3 Electronic Signature(s) Signed: 03/17/2020 5:27:22 PM By: [name], BSN, [name], [name], [name], BSN Entered By: [name], [name] on 03/17/2020 14:37:57 [name], [name] ([id number]) -------------------------------------------------------------------------------- Encounter Discharge Information Details [name]. Date of Service: 03/17/2020 2:00 PM Medical Record Number: [id number] Patient Account Number: [id number] Date of  Birth/Sex: Apr 26, 1926 (86 y.o. F) Treating [name]: [name] Primary Care [name]: [name] Other Clinician: Referring [name]: [name] Treating Cynthia Stainback/Extender: [name] in Treatment: 7 Encounter Discharge Information Items Discharge Condition: Stable Ambulatory Status: Ambulatory Discharge Destination: Home Transportation: Private Auto Accompanied By: husband Schedule Follow-up Appointment: Yes Clinical Summary of Care: Electronic Signature(s) Signed: 03/17/2020 5:27:22 PM By: [name], BSN, [name], [name], [name], BSN Entered By: [name], [name] on 03/17/2020 14:38:44 [name] ([id number]) -------------------------------------------------------------------------------- Lower Extremity Assessment Details [name], [name]. Date of Service: 03/17/2020 2:00 PM Medical Record Number: [id number] Patient Account Number: [id number] Date of Birth/Sex: 1925-12-27 (86 y.o. F) Treating [name]: Army Melia Primary Care Olivette Beckmann: [name] Other Clinician: Referring Micco Bourbeau: [name] Treating Trung Wenzl/Extender: [name] in Treatment: 7 Edema Assessment Assessed: [Left: No] [Right: No] Edema: [Left: N] [Right: o] Calf Left: Right: Point of Measurement: 30 cm From Medial Instep cm 27 cm Ankle Left: Right: Point of Measurement: 10 cm From Medial Instep cm 19 cm Vascular Assessment Pulses: Dorsalis Pedis Palpable: [Right:Yes] Electronic Signature(s) Signed: 03/17/2020 3:54:29 PM By: Army Melia Entered By: Army Melia on 03/17/2020 14:16:08 [name], [name] ([id number]) -------------------------------------------------------------------------------- Multi Wound Chart Details [name]. Date of Service: 03/17/2020 2:00 PM Medical Record Number: [id number] Patient Account Number: [id number] Date of Birth/Sex: 1926-10-17 (86 y.o. F) Treating [name]: [name] Primary Care Clare Fennimore: [name] Other Clinician: Referring Hazen Brumett: [name] Treating Daemyn Gariepy/Extender: [name] in Treatment: 7 Vital Signs Height(in): 50 Pulse(bpm): 26 Weight(lbs): 45 Blood Pressure(mmHg): 137/75 Body Mass Index(BMI): 18 Temperature(F): 98.0 Respiratory Rate(breaths/min): 16 Photos: [N/A:N/A] Wound Location: Right, Lateral Lower Leg N/A N/A Wounding Event: Trauma N/A N/A Primary Etiology: Venous Leg Ulcer N/A N/A Secondary Etiology: Trauma, Other N/A N/A Comorbid History: Cataracts, Deep Vein Thrombosis, N/A N/A Hypertension, Osteoarthritis, Dementia Date  Acquired: 01/20/2020 N/A N/A Weeks of Treatment: 7 N/A N/A Wound Status: Open N/A N/A Measurements L x W x D (cm) 3x3x0.1 N/A N/A Area (cm) : 7.069 N/A N/A Volume (cm) : 0.707 N/A N/A % Reduction in Area: 50.00% N/A N/A % Reduction in Volume: 75.00% N/A N/A Classification: Full Thickness Without Exposed N/A N/A Support Structures Exudate Amount: Medium N/A N/A Exudate Type: Purulent N/A N/A Exudate Color: yellow, brown, green N/A N/A Wound Margin: Flat and Intact N/A N/A Granulation Amount: Large (67-100%) N/A N/A Granulation Quality: Pink N/A N/A Necrotic Amount: Small (1-33%) N/A N/A Exposed Structures: Fat Layer (Subcutaneous Tissue) N/A N/A Exposed: Yes Fascia: No Tendon: No Muscle: No Joint: No Bone: No Epithelialization: None N/A N/A Treatment Notes Electronic Signature(s) Signed: 03/17/2020 5:27:22 PM By: [name], BSN, [name], [name], [name], BSN Entered By: [name], [name] on 03/17/2020 14:36:46 [name], [name] ([id number]) [name], [name] ([id number]) -------------------------------------------------------------------------------- Multi-Disciplinary Care Plan Details [name], [name]. Date of Service: 03/17/2020 2:00 PM Medical Record Number: [id number] Patient Account Number: [id number] Date of Birth/Sex: [birth year] (86 y.o. F) Treating [name]: [name] Primary Care  Sheera Illingworth: [name] Other Clinician: Referring Anahlia Iseminger: [name] Treating Karmyn Lowman/Extender: [name] in Treatment: 7 Active Inactive Necrotic Tissue Nursing Diagnoses: Impaired tissue integrity related to necrotic/devitalized tissue Goals: Necrotic/devitalized tissue will be minimized in the wound bed Date Initiated: 01/28/2020 Target Resolution Date: 02/04/2020 Goal Status: Active Interventions: Assess patient pain level pre-, during and post procedure and prior to discharge Treatment Activities: Apply topical anesthetic as ordered : 01/28/2020 Notes: Orientation to the Wound Care Program Nursing Diagnoses: Knowledge deficit related to the wound healing center program Goals: Patient/caregiver will verbalize understanding of the [place] Date Initiated: 01/28/2020 Target Resolution Date: 02/04/2020 Goal Status: Active Interventions: Provide education on orientation to the wound center Notes: Soft Tissue Infection Nursing Diagnoses: Impaired tissue integrity Goals: Patient will remain free of wound infection Date Initiated: 01/28/2020 Target Resolution Date: 02/11/2020 Goal Status: Active Interventions: Assess signs and symptoms of infection every visit Notes: Wound/Skin Impairment Nursing Diagnoses: Impaired tissue integrity Goals: Ulcer/skin breakdown will have a volume reduction of 30% by week 4 [name], [name] ([id number]) Date Initiated: 01/28/2020 Target Resolution Date: 02/27/2020 Goal Status: Active Interventions: Assess ulceration(s) every visit Treatment Activities: Skin care regimen initiated : 01/28/2020 Topical wound management initiated : 01/28/2020 Notes: Electronic Signature(s) Signed: 03/17/2020 5:27:22 PM By: [name], BSN, [name], [name], [name], BSN Entered By: [name], [name] on 03/17/2020 14:36:37 [place], [name]  ([id number]) -------------------------------------------------------------------------------- Pain Assessment Details [name]. Date of Service: 03/17/2020 2:00 PM Medical Record Number: [id number] Patient Account Number: [id number] Date of Birth/Sex: [birth year] (86 y.o. F) Treating [name]: Army Melia Primary Care Ah Bott: [name] Other Clinician: Referring Naelani Lafrance: [name] Treating Darris Carachure/Extender: [name] in Treatment: 7 Active Problems Location of Pain Severity and Description of Pain Patient Has Paino Yes Site Locations Pain Location: Pain in Ulcers Rate the pain. Current Pain Level: 9 Pain Management and Medication Current Pain Management: Electronic Signature(s) Signed: 03/17/2020 3:54:29 PM By: Army Melia Entered By: Army Melia on 03/17/2020 14:13:38 [name], [name] ([id number]) -------------------------------------------------------------------------------- Patient/Caregiver Education Details [name] Date of Service: 03/17/2020 2:00 PM Medical Record Number: [id number] Patient Account Number: [id number] Date of Birth/Gender: Aug 13, 1926 (86 y.o. F) Treating [name]: [name] Primary Care Physician: [name] Other Clinician: Referring Physician: Derinda [name] Treating Physician/Extender: [name] in Treatment: 7 Education  Assessment Education Provided To: Patient Education Topics Provided Wound/Skin Impairment: Handouts: Caring for Your Ulcer Methods: Demonstration, Explain/Verbal Responses: State content correctly Electronic Signature(s) Signed: 03/17/2020 5:27:22 PM By: [name], BSN, [name], [name], [name], BSN Entered By: [name], [name] on 03/17/2020 14:38:15 [name] ([id number]) -------------------------------------------------------------------------------- Wound Assessment Details [name], [name]. Date of Service: 03/17/2020 2:00  PM Medical Record Number: [id number] Patient Account Number: [id number] Date of Birth/Sex: 1926/10/22 (86 y.o. F) Treating [name]: Army Melia Primary Care Jashae Wiggs: [name] Other Clinician: Referring Genasis Zingale: [name] Treating Swade Shonka/Extender: [name] in Treatment: 7 Wound Status Wound Number: 3 Primary Venous Leg Ulcer Etiology: Wound Location: Right, Lateral Lower Leg Secondary Trauma, Other Wounding Event: Trauma Etiology: Date Acquired: 01/20/2020 Wound Status: Open Weeks Of Treatment: 7 Comorbid Cataracts, Deep Vein Thrombosis, Hypertension, Clustered Wound: No History: Osteoarthritis, Dementia Photos Wound Measurements Length: (cm) 3 Width: (cm) 3 Depth: (cm) 0.1 Area: (cm) 7.069 Volume: (cm) 0.707 % Reduction in Area: 50% % Reduction in Volume: 75% Epithelialization: None Wound Description Classification: Full Thickness Without Exposed Support Structu Wound Margin: Flat and Intact Exudate Amount: Medium Exudate Type: Purulent Exudate Color: yellow, brown, green res Foul Odor After Cleansing: No Slough/Fibrino Yes Wound Bed Granulation Amount: Large (67-100%) Exposed Structure Granulation Quality: Pink Fascia Exposed: No Necrotic Amount: Small (1-33%) Fat Layer (Subcutaneous Tissue) Exposed: Yes Necrotic Quality: Adherent Slough Tendon Exposed: No Muscle Exposed: No Joint Exposed: No Bone Exposed: No Treatment Notes Wound #3 (Right, Lateral Lower Leg) Notes Santyl, hblue, ABD, kerlix, coban Electronic Signature(s) [name], [name] ([id number]) Signed: 03/17/2020 3:54:29 PM By: Army Melia Entered By: Army Melia on 03/17/2020 14:14:36 [name], [name] ([id number]) -------------------------------------------------------------------------------- Vitals Details [name]. Date of Service: 03/17/2020 2:00 PM Medical Record Number: [id number] Patient Account Number: [id number] Date of Birth/Sex:  11-11-1925 (86 y.o. F) Treating [name]: [name] Primary Care Denessa Cavan: [name] Other Clinician: Referring Clydean Posas: [name] Treating Sarabella Caprio/Extender: [name] in Treatment: 7 Vital Signs Time Taken: 14:05 Temperature (F): 98.0 Height (in): 62 Pulse (bpm): 78 Weight (lbs): 98 Respiratory Rate (breaths/min): 16 Body Mass Index (BMI): 17.9 Blood Pressure (mmHg): 137/75 Reference Range: 80 - 120 mg / dl Electronic Signature(s) Signed: 03/18/2020 11:04:59 AM By: [name] [name] Entered By: [name] on 03/17/2020 14:09:44

## 2020-03-24 ENCOUNTER — Other Ambulatory Visit: Payer: Self-pay

## 2020-03-24 ENCOUNTER — Encounter: Payer: Medicare Other | Attending: Internal Medicine | Admitting: Internal Medicine

## 2020-03-24 DIAGNOSIS — M1990 Unspecified osteoarthritis, unspecified site: Secondary | ICD-10-CM | POA: Insufficient documentation

## 2020-03-24 DIAGNOSIS — L97812 Non-pressure chronic ulcer of other part of right lower leg with fat layer exposed: Secondary | ICD-10-CM | POA: Insufficient documentation

## 2020-03-24 DIAGNOSIS — I872 Venous insufficiency (chronic) (peripheral): Secondary | ICD-10-CM | POA: Insufficient documentation

## 2020-03-24 DIAGNOSIS — I10 Essential (primary) hypertension: Secondary | ICD-10-CM | POA: Insufficient documentation

## 2020-03-24 DIAGNOSIS — G309 Alzheimer's disease, unspecified: Secondary | ICD-10-CM | POA: Diagnosis not present

## 2020-03-24 DIAGNOSIS — I482 Chronic atrial fibrillation, unspecified: Secondary | ICD-10-CM | POA: Diagnosis not present

## 2020-03-24 DIAGNOSIS — Z7901 Long term (current) use of anticoagulants: Secondary | ICD-10-CM | POA: Insufficient documentation

## 2020-03-24 DIAGNOSIS — F0280 Dementia in other diseases classified elsewhere without behavioral disturbance: Secondary | ICD-10-CM | POA: Diagnosis not present

## 2020-03-24 DIAGNOSIS — L88 Pyoderma gangrenosum: Secondary | ICD-10-CM | POA: Insufficient documentation

## 2020-03-25 NOTE — Progress Notes (Signed)
METHA, LUFFMAN ([id number]) Visit Report for 03/24/2020 HPI Details [name], [name]. Date of Service: 03/24/2020 12:30 PM Medical Record Number: [id number] Patient Account Number: [id number] Date of Birth/Sex: 1926/06/12 (86 y.o. F) Treating RN: Cornell Barman Primary Care Provider: Derinda Late Other Clinician: Referring Provider: BABAOFF, MARCUS Treating Provider/Extender: Tito Dine in Treatment: 8 History of Present Illness HPI Description: 06/06/17 on evaluation today patient presents for evaluation concerning an ulcer which she initially had arise as a result of striking her left lower extremity money bed frame. With that being said this was roughly one month ago and unfortunately the wound despite two courses of Keflex have not really made a dramatic improvement. This has continued to drain as well as being exquisitely tender at times. Even to the point that she is having a difficult time walking. Patient does have chronic atrial fibrillation which subsequently leads to her being on long-term anticoagulant therapy and this causes ED bruising which may have contributed to this entry and where things are at this point. She does have valvular heart disease as well as hypertension and her last INR was 2.7. Currently Neosporin, Gauls, and an ace wrap has been utilized. Keflex is the antibiotic that she has been on. Patient has not had a culture up to this point and she states that her blood pressure at the primary care office yesterday was 138/72. Her pain is significant related to be an eight out of 10. No fevers, chills, nausea, or vomiting noted at this time. 06/20/17; patient was admitted here 2 weeks ago. She had a traumatic wound on her left lateral lower leg probably in the setting of some degree of venous insufficiency with surrounding cellulitis. She had been on Keflex, after we saw her she was put on doxycycline which she apparently did not tolerate. Culture  that was done at the time showed Morganella which was resistant to Keflex. Apparently the patient continued to take Keflex after the appointment. She is also on Coumadin. I had tried to change her antibiotic when the Morganella culture was brought to my attention however apparently our staff could not reach the patient to inform them of the antibiotic change 06/26/17; patient has wound on her left lateral lower extremity in the setting of some degree of venous insufficiency. I gave her cefdinir last week and she is completing this. There is no evidence of surrounding infection. Aggressive debridement last week, wound bed looks healthier. We've been using Aquacel. They're traveling in the [place] next week we'll see her back in 2 weeks unless there are problems. 07/10/17; the patient is been using Aquacel Ag her husband is changing this with Kerlix and conform. She still complains of a lot of pain. Apparently with the antibiotics. We gave 2 or 3 weeks ago. Her INR went up to 6, this was managed by her primary physician 07/17/17; she is using Aquacel Ag and a border foam. Still discomfort although dimensions are better. 07/24/17; patient or for review of a trauma wound on her left anterior leg in the setting of some degree of chronic venous insufficiency she has been using Aquacel Ag and a border foam and making progress. 07/31/17; only a small open area of this original significant trauma is still open. Her husband is changing this every second day [Aquacel Ag] Readmission: 01/14/19 on evaluation today patient is that she seen for initial inspection during the office visit today concerning a trauma/skin tear to the left anterior lower extremity. This is roughly  the same region where she was previously treated and years past when she was here in the clinic. Nonetheless upon evaluation today the patient's wound actually showed signs of decent granulation around the edge although unfortunately the skin flap  that was torn back did fold under on itself and the flap itself is dying. She does have some eschar surrounded the edges of the wound at this point. Nonetheless this also was very tender for her. The patient does have a history of hypertension, atrial fibrillation, and long-term use anticoagulant therapy. 01/21/19 on evaluation today patient's wound on the anterior portion of her lower extremity appears to still be necrotic as far as the surface of the wound is concerned. Unfortunately there is no significant improvement at this point compared to last week they were not able to get the Santyl that are using Medihoney and maybe one reason why. The other is I think this may be stained to drive. Possibly adding something such as mepitel over top of the Medihoney may help to loosen this up quite a bit which I think would be in her best interest. Also she still has a lot of erythema and is very tender to touch I'm afraid that we may need to switch to a different antibiotic to see if this will be of benefit there still really nothing that I can culture to get a better picture of what's going on and what we need to do from the standpoint of antibiotics. I do believe the patient needs a referral Talmuds me to vascular in order to evaluate her blood flow based on what I'm seeing they will be able to perform TBI testing if nothing else along with the vascular evaluation to see if there's any evidence for limited blood flow to this lower extremity. 4/8; I have reviewed the patient's arterial studies and they are really quite good. There should be no arterial impediments to healing the wound on the right anterior tibial area. 4/15; patient has a small open area over the left mid tibial area. Still requiring debridement we are using Santyl here and Medihoney at home [Santyl unaffordable] 4/22; difficult, presumably traumatic area over the left mid tibial area in the setting of chronic venous insufficiency. We have  been using Santyl here and meta honey at home. The wound surface is getting gradually better and slight improvement in dimensions. Her husband is changing this daily 4/29; left mid tibia presumably traumatic wound in the setting of chronic venous insufficiency. We have been using Santyl and a combination of Medihoney at home. Arrives today with a much better looking healthy granulated surface. We will change the primary dressing to [facility] 5/6; left mid tibia smaller reasonably healthy looking wound using Hydrofera Blue since last week 5/20-Patient returns to clinic with a left mid tibial wound looking better, we have been using Hydrofera Blue since the previous week 5/27; patient returns to clinic with a left mid tibial wound covered in raised eschar. We have been using Hydrofera Blue her husband changes the dressings using a border foam cover READMISSION 01/28/20 Mrs. Kubin is a patient who is now 86 years old. As usual she comes in with her husband who is her primary caregiver. They were here last from March through May 2020 with a traumatic wound on the left anterior tibia we eventually heal this out. She has chronic venous insufficiency. She is also here previously in 2018. ANABELA, CRAYTON ([id number]) Her current problem started 8 days ago. She went in  the hospital for a thoracic level kyphoplasty. The procedure she developed a skin tear on her leg. This was mentioned to her. Her husband is applying Medihoney to the area and they are in for our review of this. She finds this much too uncomfortable to attempt ABIs although her ABIs when she was in the clinic last year were quite normal bilaterally Past medical history she is not a diabetic. As noted she had a T9 and T6 kyphoplasty, Alzheimer's disease, chronic atrial fibrillation on Coumadin and hypertension 4/14; patient arrives in clinic having removed part of the wrap. We attempted to leave this on all week. Unfortunately none  that none of the tissue that looks semiviable last week has remained viable. She required an extensive debridement. We have been using silver alginate 4/21; the patient arrived today with tremendous erythema around the large part of the wound on the right lateral leg. This extends medially and down towards her foot. I marked the area but I think the extent of the likely cellulitis is beyond what can be safely managed as an outpatient. 02/23/2020 upon evaluation today patient is seen today though she is typically followed by Dr. Dellia Nims. Last time he saw her he sent her to the hospital and she subsequently was in the hospital for some time. In fact it was from 02/11/2020 through 02/19/2020. During that time she was on antibiotic therapy by way of IV antibiotics. Her leg does appear to be doing better today compared to what we saw then. Nonetheless she had a culture that was obtained while she was in the hospital by Dr. Steva Ready. This ended up not growing anything which is not really surprising considering she was on antibiotics which were fairly strong at the time of culture. There was no growth at all after 2 days. With that being said Dr. Steva Ready apparently discussed with Dr. Dellia Nims per her note anyway that she wanted the patient off of the antibiotics for a week and then to repeat a culture in the outpatient setting. She also has a suspicion for the possibility of pyoderma and has referred the patient to Dr. Nehemiah Massed and the patient is supposed to be seeing him sometime I believe Thursday of this week. Nonetheless I explained that pyoderma can sometimes begin as a injury or insult that then worsens. Obviously the initial injury here appears to have been a scraping of her leg that occurred when the patient was being transferred off of the table after a kyphoplasty procedure. Fortunately again as mentioned the infection seems to be doing significantly better based on what I am seeing at this point  compared to previous pictures that I reviewed today. 5/5; the patient is back to my clinic today after seeing with stone 2 days ago. She is on [employer] with a Kerlix Coban I have reviewed her recent history. We had sent her to the hospital with presumed cellulitis considerable erythema in the lateral leg. She received prolonged courses of antibiotics in the hospital. I received a call from one of the hospitalist to report that he didn't feel that she was responding to antibiotics although as noted 2 days ago by Jeri Cos the wound actually looks a lot better along with the periwound skin which is not nearly as erythematous. He raised the possibility of pyoderma gangrenosum which also had been brought up by Dr. Steva Ready the infectious disease consultant. The discharge culture was negative but she was still on antibiotics The pyoderma gangrenosum apparently has something to do  with the fact that both her sons have Crohn's disease although the patient does not have Crohn's disease. I told the doctor that call me I didn't really think this fit with the history which was a trauma induced wound why the patient was having a kyphoplasty. In fact her husband is still very angry about this with the hospital. She also has severe chronic venous insufficiency with hemosiderin and stasis dermatitis and has had wounds in both legs. 5/12; venous insufficiency wound on the right lower lateral lower leg initially trauma. Still debris on the surface of this wound that undergoes a difficult debridement. We have been using Hydrofera Blue 5/19; venous insufficiency wound on the right lower lateral leg initially trauma. Better looking wound surface and a rim of epithelialization. Surface area is smaller we have been using Hydrofera Blue under compression 03/17/20-Patient returns at 1 week, the wound is looking stable, dimensions are slightly smaller, continue Hydrofera Blue and Santyl under compression 6/2; venous  insufficiency wound on the right lower leg initially traumatic. She has a considerably better looking wound surface than when I saw this 2 weeks ago. Some hyper granulation. We have been using Santyl with [facility] cover. I no longer think the Santyl is necessary we will just use Hydrofera Blue kerlix Coban compression They are going to be traveling including to the [facility] and then subsequently a family vacation in [place]. Her daughter-in-law stated that they would be able to get her in here in 2 weeks before they go to [place] I think that would be good plan. We will show her how to do the dressing including the Kerlix Coban Electronic Signature(s) Signed: 03/24/2020 4:41:32 PM By: Linton Ham MD Entered By: Linton Ham on 03/24/2020 13:04:40 Wagoner, [name] ([id number]) -------------------------------------------------------------------------------- Physical Exam Details Patient Name: MARYLON, YONKE C. Date of Service: 03/24/2020 12:30 PM Medical Record Number: [id number] Patient Account Number: [id number] Date of Birth/Sex: Aug 14, 1926 (86 y.o. F) Treating RN: Cornell Barman Primary Care Provider: Derinda Late Other Clinician: Referring Provider: BABAOFF, MARCUS Treating Provider/Extender: Ricard Dillon Weeks in Treatment: 8 Constitutional Patient is hypertensive.. Pulse regular and within target range for patient.Marland Kitchen Respirations regular, non-labored and within target range.. Temperature is normal and within the target range for the patient.Marland Kitchen appears in no distress. Cardiovascular Edema pedal pulses are palpable. Notes Wound exam; right anterior leg wound. Slight hyper granulation but nothing that requires mechanical debridement. We have good edema control. Electronic Signature(s) Signed: 03/24/2020 4:41:32 PM By: Linton Ham MD Entered By: Linton Ham on 03/24/2020 13:05:46 [name]  ([id number]) -------------------------------------------------------------------------------- Physician Orders Details Patient Name: [name] Date of Service: 03/24/2020 12:30 PM Medical Record Number: [id number] Patient Account Number: [id number] Date of Birth/Sex: May 25, 1926 (86 y.o. F) Treating RN: Cornell Barman Primary Care Provider: BABAOFF, MARCUS Other Clinician: Referring Provider: BABAOFF, MARCUS Treating Provider/Extender: Tito Dine in Treatment: 8 Verbal / Phone Orders: No Diagnosis Coding Wound Cleansing Wound #3 Right,Lateral Lower Leg o Cleanse wound with mild soap and water Anesthetic (add to Medication List) Wound #3 Right,Lateral Lower Leg o Topical Lidocaine 4% cream applied to wound bed prior to debridement (In Clinic Only). Primary Wound Dressing o Hydrafera Blue Ready Transfer Secondary Dressing Wound #3 Right,Lateral Lower Leg o ABD pad o Kerlix and Coban Dressing Change Frequency Wound #3 Right,Lateral Lower Leg o Change Dressing Monday, Wednesday, Friday Follow-up Appointments Wound #3 Right,Lateral Lower Leg o Return Appointment in 1 week. Edema Control Wound #3 Right,Lateral Lower Leg o Kerlix  and Coban - Right Lower Extremity o Elevate legs to the level of the heart and pump ankles as often as possible Additional Orders / Instructions Wound #3 Right,Lateral Lower Leg o Increase protein intake. Home Health Wound #3 [place] Visits - Monday and Friday (Wednesdays in clinic) [facility] Nurse may visit PRN to address patientos wound care needs. o FACE TO FACE ENCOUNTER: MEDICARE and MEDICAID PATIENTS: I certify that this patient is under my care and that I had a face-to-face encounter that meets the physician face-to-face encounter requirements with this patient on this date. The encounter with the patient was in whole or in part for the following MEDICAL CONDITION:  (primary reason for [place]) MEDICAL NECESSITY: I certify, that based on my findings, NURSING services are a medically necessary home health service. HOME BOUND STATUS: I certify that my clinical findings support that this patient is homebound (i.e., Due to illness or injury, pt requires aid of supportive devices such as crutches, cane, wheelchairs, walkers, the use of special transportation or the assistance of another person to leave their place of residence. There is a normal inability to leave the home and doing so requires considerable and taxing effort. Other absences are for medical reasons / religious services and are infrequent or of short duration when for other reasons). o If current dressing causes regression in wound condition, may D/C ordered dressing product/s and apply Normal Saline Moist Dressing daily until next [place] / Other MD appointment. Mikes of regression in wound condition at [phone]. o Please direct any NON-WOUND related issues/requests for orders to patient's Primary Care Physician MYLINDA, HASKETT ([id number]) Electronic Signature(s) Signed: 03/24/2020 4:41:32 PM By: Linton Ham MD Signed: 03/25/2020 1:40:27 PM By: Gretta Cool, BSN, RN, [name], Kim RN, BSN Entered By: Gretta Cool, BSN, RN, [name], Kim on 03/24/2020 12:54:20 [name] ([id number]) -------------------------------------------------------------------------------- Problem List Details Patient Name: ADLIH, PEDROZA. Date of Service: 03/24/2020 12:30 PM Medical Record Number: [id number] Patient Account Number: [id number] Date of Birth/Sex: 09-24-1926 (86 y.o. F) Treating RN: Cornell Barman Primary Care Provider: Derinda Late Other Clinician: Referring Provider: BABAOFF, MARCUS Treating Provider/Extender: Tito Dine in Treatment: 8 Active Problems ICD-10 Encounter Code Description Active Date MDM Diagnosis S81.811D Laceration without foreign  body, right lower leg, subsequent encounter 01/28/2020 No Yes L97.812 Non-pressure chronic ulcer of other part of right lower leg with fat layer 01/28/2020 No Yes exposed I87.321 Chronic venous hypertension (idiopathic) with inflammation of right 01/28/2020 No Yes lower extremity Inactive Problems Resolved Problems Electronic Signature(s) Signed: 03/24/2020 4:41:32 PM By: Linton Ham MD Entered By: Linton Ham on 03/24/2020 13:02:59 Bridgett, [name] ([id number]) -------------------------------------------------------------------------------- Progress Note Details Patient Name: [name]. Date of Service: 03/24/2020 12:30 PM Medical Record Number: [id number] Patient Account Number: [id number] Date of Birth/Sex: 25-Jul-1926 (86 y.o. F) Treating RN: Cornell Barman Primary Care Provider: Derinda Late Other Clinician: Referring Provider: BABAOFF, MARCUS Treating Provider/Extender: Ricard Dillon Weeks in Treatment: 8 Subjective History of Present Illness (HPI) 06/06/17 on evaluation today patient presents for evaluation concerning an ulcer which she initially had arise as a result of striking her left lower extremity money bed frame. With that being said this was roughly one month ago and unfortunately the wound despite two courses of Keflex have not really made a dramatic improvement. This has continued to drain as well as being exquisitely tender at times. Even to the point that she is having a difficult time  walking. Patient does have chronic atrial fibrillation which subsequently leads to her being on long-term anticoagulant therapy and this causes ED bruising which may have contributed to this entry and where things are at this point. She does have valvular heart disease as well as hypertension and her last INR was 2.7. Currently Neosporin, Gauls, and an ace wrap has been utilized. Keflex is the antibiotic that she has been on. Patient has not had a culture up to this point and she  states that her blood pressure at the primary care office yesterday was 138/72. Her pain is significant related to be an eight out of 10. No fevers, chills, nausea, or vomiting noted at this time. 06/20/17; patient was admitted here 2 weeks ago. She had a traumatic wound on her left lateral lower leg probably in the setting of some degree of venous insufficiency with surrounding cellulitis. She had been on Keflex, after we saw her she was put on doxycycline which she apparently did not tolerate. Culture that was done at the time showed Morganella which was resistant to Keflex. Apparently the patient continued to take Keflex after the appointment. She is also on Coumadin. I had tried to change her antibiotic when the Morganella culture was brought to my attention however apparently our staff could not reach the patient to inform them of the antibiotic change 06/26/17; patient has wound on her left lateral lower extremity in the setting of some degree of venous insufficiency. I gave her cefdinir last week and she is completing this. There is no evidence of surrounding infection. Aggressive debridement last week, wound bed looks healthier. We've been using Aquacel. They're traveling in the [place] next week we'll see her back in 2 weeks unless there are problems. 07/10/17; the patient is been using Aquacel Ag her husband is changing this with Kerlix and conform. She still complains of a lot of pain. Apparently with the antibiotics. We gave 2 or 3 weeks ago. Her INR went up to 6, this was managed by her primary physician 07/17/17; she is using Aquacel Ag and a border foam. Still discomfort although dimensions are better. 07/24/17; patient or for review of a trauma wound on her left anterior leg in the setting of some degree of chronic venous insufficiency she has been using Aquacel Ag and a border foam and making progress. 07/31/17; only a small open area of this original significant trauma is still open. Her  husband is changing this every second day [Aquacel Ag] Readmission: 01/14/19 on evaluation today patient is that she seen for initial inspection during the office visit today concerning a trauma/skin tear to the left anterior lower extremity. This is roughly the same region where she was previously treated and years past when she was here in the clinic. Nonetheless upon evaluation today the patient's wound actually showed signs of decent granulation around the edge although unfortunately the skin flap that was torn back did fold under on itself and the flap itself is dying. She does have some eschar surrounded the edges of the wound at this point. Nonetheless this also was very tender for her. The patient does have a history of hypertension, atrial fibrillation, and long-term use anticoagulant therapy. 01/21/19 on evaluation today patient's wound on the anterior portion of her lower extremity appears to still be necrotic as far as the surface of the wound is concerned. Unfortunately there is no significant improvement at this point compared to last week they were not able to get the [place] that are  using Medihoney and maybe one reason why. The other is I think this may be stained to drive. Possibly adding something such as mepitel over top of the Medihoney may help to loosen this up quite a bit which I think would be in her best interest. Also she still has a lot of erythema and is very tender to touch I'm afraid that we may need to switch to a different antibiotic to see if this will be of benefit there still really nothing that I can culture to get a better picture of what's going on and what we need to do from the standpoint of antibiotics. I do believe the patient needs a referral Talmuds me to vascular in order to evaluate her blood flow based on what I'm seeing they will be able to perform TBI testing if nothing else along with the vascular evaluation to see if there's any evidence for limited  blood flow to this lower extremity. 4/8; I have reviewed the patient's arterial studies and they are really quite good. There should be no arterial impediments to healing the wound on the right anterior tibial area. 4/15; patient has a small open area over the left mid tibial area. Still requiring debridement we are using Santyl here and Medihoney at home [Santyl unaffordable] 4/22; difficult, presumably traumatic area over the left mid tibial area in the setting of chronic venous insufficiency. We have been using Santyl here and meta honey at home. The wound surface is getting gradually better and slight improvement in dimensions. Her husband is changing this daily 4/29; left mid tibia presumably traumatic wound in the setting of chronic venous insufficiency. We have been using Santyl and a combination of Medihoney at home. Arrives today with a much better looking healthy granulated surface. We will change the primary dressing to [facility] 5/6; left mid tibia smaller reasonably healthy looking wound using Hydrofera Blue since last week 5/20-Patient returns to clinic with a left mid tibial wound looking better, we have been using Hydrofera Blue since the previous week 5/27; patient returns to clinic with a left mid tibial wound covered in raised eschar. We have been using Hydrofera Blue her husband changes the dressings using a border foam cover READMISSION 01/28/20 Mrs. Knuckles is a patient who is now 86 years old. As usual she comes in with her husband who is her primary caregiver. They were here last from March through May 2020 with a traumatic wound on the left anterior tibia we eventually heal this out. She has chronic venous insufficiency. She is also here previously in 2018. Her current problem started 8 days ago. She went in the hospital for a thoracic level kyphoplasty. The procedure she developed a skin tear on her leg. This was mentioned to her. Her husband is applying [employer] to  the area and they are in for our review of this. She finds this much too uncomfortable to attempt ABIs although her ABIs when she was in the clinic last year were quite normal bilaterally Baltz, Blasa C. ([id number]) Past medical history she is not a diabetic. As noted she had a T9 and T6 kyphoplasty, Alzheimer's disease, chronic atrial fibrillation on Coumadin and hypertension 4/14; patient arrives in clinic having removed part of the wrap. We attempted to leave this on all week. Unfortunately none that none of the tissue that looks semiviable last week has remained viable. She required an extensive debridement. We have been using silver alginate 4/21; the patient arrived today with tremendous erythema around  the large part of the wound on the right lateral leg. This extends medially and down towards her foot. I marked the area but I think the extent of the likely cellulitis is beyond what can be safely managed as an outpatient. 02/23/2020 upon evaluation today patient is seen today though she is typically followed by Dr. Dellia Nims. Last time he saw her he sent her to the hospital and she subsequently was in the hospital for some time. In fact it was from 02/11/2020 through 02/19/2020. During that time she was on antibiotic therapy by way of IV antibiotics. Her leg does appear to be doing better today compared to what we saw then. Nonetheless she had a culture that was obtained while she was in the hospital by Dr. Steva Ready. This ended up not growing anything which is not really surprising considering she was on antibiotics which were fairly strong at the time of culture. There was no growth at all after 2 days. With that being said Dr. Steva Ready apparently discussed with Dr. Dellia Nims per her note anyway that she wanted the patient off of the antibiotics for a week and then to repeat a culture in the outpatient setting. She also has a suspicion for the possibility of pyoderma and has referred the patient  to Dr. Nehemiah Massed and the patient is supposed to be seeing him sometime I believe Thursday of this week. Nonetheless I explained that pyoderma can sometimes begin as a injury or insult that then worsens. Obviously the initial injury here appears to have been a scraping of her leg that occurred when the patient was being transferred off of the table after a kyphoplasty procedure. Fortunately again as mentioned the infection seems to be doing significantly better based on what I am seeing at this point compared to previous pictures that I reviewed today. 5/5; the patient is back to my clinic today after seeing with stone 2 days ago. She is on [employer] with a Kerlix Coban I have reviewed her recent history. We had sent her to the hospital with presumed cellulitis considerable erythema in the lateral leg. She received prolonged courses of antibiotics in the hospital. I received a call from one of the hospitalist to report that he didn't feel that she was responding to antibiotics although as noted 2 days ago by Jeri Cos the wound actually looks a lot better along with the periwound skin which is not nearly as erythematous. He raised the possibility of pyoderma gangrenosum which also had been brought up by Dr. Steva Ready the infectious disease consultant. The discharge culture was negative but she was still on antibiotics The pyoderma gangrenosum apparently has something to do with the fact that both her sons have Crohn's disease although the patient does not have Crohn's disease. I told the doctor that call me I didn't really think this fit with the history which was a trauma induced wound why the patient was having a kyphoplasty. In fact her husband is still very angry about this with the hospital. She also has severe chronic venous insufficiency with hemosiderin and stasis dermatitis and has had wounds in both legs. 5/12; venous insufficiency wound on the right lower lateral lower leg initially  trauma. Still debris on the surface of this wound that undergoes a difficult debridement. We have been using Hydrofera Blue 5/19; venous insufficiency wound on the right lower lateral leg initially trauma. Better looking wound surface and a rim of epithelialization. Surface area is smaller we have been using Hydrofera Blue under  compression 03/17/20-Patient returns at 1 week, the wound is looking stable, dimensions are slightly smaller, continue Hydrofera Blue and Santyl under compression 6/2; venous insufficiency wound on the right lower leg initially traumatic. She has a considerably better looking wound surface than when I saw this 2 weeks ago. Some hyper granulation. We have been using Santyl with [facility] cover. I no longer think the Santyl is necessary we will just use Hydrofera Blue kerlix Coban compression They are going to be traveling including to the [facility] and then subsequently a family vacation in [place]. Her daughter-in-law stated that they would be able to get her in here in 2 weeks before they go to [place] I think that would be good plan. We will show her how to do the dressing including the Kerlix Coban Objective Constitutional Patient is hypertensive.. Pulse regular and within target range for patient.Marland Kitchen Respirations regular, non-labored and within target range.. Temperature is normal and within the target range for the patient.Marland Kitchen appears in no distress. Vitals Time Taken: 12:35 PM, Height: 62 in, Weight: 98 lbs, BMI: 17.9, Temperature: 97.9 F, Pulse: 88 bpm, Respiratory Rate: 16 breaths/min, Blood Pressure: 166/92 mmHg. Cardiovascular Edema pedal pulses are palpable. General Notes: Wound exam; right anterior leg wound. Slight hyper granulation but nothing that requires mechanical debridement. We have good edema control. Integumentary (Hair, Skin) Wound #3 status is Open. Original cause of wound was Trauma. The wound is located on the Right,Lateral  Lower Leg. The wound measures 2.1cm length x 1.9cm width x 0.1cm depth; 3.134cm^2 area and 0.313cm^3 volume. There is Fat Layer (Subcutaneous Tissue) Exposed exposed. There is no tunneling or undermining noted. There is a medium amount of purulent drainage noted. The wound margin is flat and intact. There is large (67-100%) pink, hyper - granulation within the wound bed. There is a small (1-33%) amount of necrotic tissue within the wound bed including Adherent Slough. EMONIE, POLIVKA ([id number]) Assessment Active Problems ICD-10 Laceration without foreign body, right lower leg, subsequent encounter Non-pressure chronic ulcer of other part of right lower leg with fat layer exposed Chronic venous hypertension (idiopathic) with inflammation of right lower extremity Plan Wound Cleansing: Wound #3 Right,Lateral Lower Leg: Cleanse wound with mild soap and water Anesthetic (add to Medication List): Wound #3 Right,Lateral Lower Leg: Topical Lidocaine 4% cream applied to wound bed prior to debridement (In Clinic Only). Primary Wound Dressing: Hydrafera Blue Ready Transfer Secondary Dressing: Wound #3 Right,Lateral Lower Leg: ABD pad Kerlix and Coban Dressing Change Frequency: Wound #3 Right,Lateral Lower Leg: Change Dressing Monday, Wednesday, Friday Follow-up Appointments: Wound #3 Right,Lateral Lower Leg: Return Appointment in 1 week. Edema Control: Wound #3 Right,Lateral Lower Leg: Kerlix and Coban - Right Lower Extremity Elevate legs to the level of the heart and pump ankles as often as possible Additional Orders / Instructions: Wound #3 Right,Lateral Lower Leg: Increase protein intake. Home Health: Wound #3 Right,Lateral Lower Leg: Continue Home Health Visits - Monday and Friday (Wednesdays in clinic) The [place] Nurse may visit PRN to address patient s wound care needs. FACE TO FACE ENCOUNTER: MEDICARE and MEDICAID PATIENTS: I certify that this patient is under my care and  that I had a face-to-face encounter that meets the physician face-to-face encounter requirements with this patient on this date. The encounter with the patient was in whole or in part for the following MEDICAL CONDITION: (primary reason for [place]) MEDICAL NECESSITY: I certify, that based on my findings, NURSING services are a medically necessary home health  service. HOME BOUND STATUS: I certify that my clinical findings support that this patient is homebound (i.e., Due to illness or injury, pt requires aid of supportive devices such as crutches, cane, wheelchairs, walkers, the use of special transportation or the assistance of another person to leave their place of residence. There is a normal inability to leave the home and doing so requires considerable and taxing effort. Other absences are for medical reasons / religious services and are infrequent or of short duration when for other reasons). If current dressing causes regression in wound condition, may D/C ordered dressing product/s and apply Normal Saline Moist Dressing daily until next [place] / Other MD appointment. [place] of regression in wound condition at [phone]. Please direct any NON-WOUND related issues/requests for orders to patient's Primary Care Physician 1. Hydrofera Blue under Curlex Coban. There is no need for the Santyl at this point 2. We will see her again in 2 weeks 3. Making steady progress. Wound surface looks healthy. Hydrofera Blue is indeed friendly to hyper granulated areas. Electronic Signature(s) Signed: 03/24/2020 4:41:32 PM By: Linton Ham MD Entered By: Linton Ham on 03/24/2020 13:07:14 [name] ([id number]) -------------------------------------------------------------------------------- SuperBill Details Patient Name: [name] Date of Service: 03/24/2020 Medical Record Number: [id number] Patient Account Number: [id number] Date of  Birth/Sex: [birth year] (86 y.o. F) Treating RN: Cornell Barman Primary Care Provider: Derinda Late Other Clinician: Referring Provider: BABAOFF, MARCUS Treating Provider/Extender: Ricard Dillon Weeks in Treatment: 8 Diagnosis Coding ICD-10 Codes Code Description S81.811D Laceration without foreign body, right lower leg, subsequent encounter L97.812 Non-pressure chronic ulcer of other part of right lower leg with fat layer exposed I87.321 Chronic venous hypertension (idiopathic) with inflammation of right lower extremity Facility Procedures CPT4 Code: [id number] Description: 99213 - WOUND CARE VISIT-LEV 3 EST PT Modifier: Quantity: 1 Physician Procedures CPT4 Code: [id number] Description: [id number] - WC PHYS LEVEL 3 - EST PT Modifier: Quantity: 1 CPT4 Code: Description: ICD-10 Diagnosis Description [id number] Non-pressure chronic ulcer of other part of right lower leg with fat l I87.321 Chronic venous hypertension (idiopathic) with inflammation of right lo Modifier: ayer exposed wer extremity Quantity: Electronic Signature(s) Signed: 03/24/2020 4:41:32 PM By: Linton Ham MD Entered By: Linton Ham on 03/24/2020 13:07:53

## 2020-03-25 NOTE — Progress Notes (Signed)
SATOKO, DRENNON ([id number]) Visit Report for 03/24/2020 Arrival Information Details [name], [name]. Date of Service: 03/24/2020 12:30 PM Medical Record Number: [id number] Patient Account Number: [id number] Date of Birth/Sex: [birth year] (86 y.o. F) Treating RN: Cornell Barman Primary Care Cydne Grahn: Derinda Late Other Clinician: Referring Annisten Manchester: BABAOFF, MARCUS Treating Loraina Stauffer/Extender: Tito Dine in Treatment: 8 Visit Information History Since Last Visit Added or deleted any medications: No Patient Arrived: Ambulatory Any new allergies or adverse reactions: No Arrival Time: 12:38 Had a fall or experienced change in No Accompanied By: daughter activities of daily living that may affect Transfer Assistance: None risk of falls: Patient Identification Verified: Yes Signs or symptoms of abuse/neglect since last visito No Secondary Verification Process Completed: Yes Hospitalized since last visit: No Patient Has Alerts: Yes Implantable device outside of the clinic excluding No Patient Alerts: Patient on Blood Thinner cellular tissue based products placed in the center warfarin since last visit: Has Dressing in Place as Prescribed: No Pain Present Now: No Electronic Signature(s) Signed: 03/24/2020 4:11:28 PM By: Lorine Bears RCP, RRT, CHT Entered By: Lorine Bears on 03/24/2020 12:40:05 [name] ([id number]) -------------------------------------------------------------------------------- Clinic Level of Care Assessment Details [name]. Date of Service: 03/24/2020 12:30 PM Medical Record Number: [id number] Patient Account Number: [id number] Date of Birth/Sex: 05/26/1926 (86 y.o. F) Treating RN: Cornell Barman Primary Care Syrai Gladwin: BABAOFF, MARCUS Other Clinician: Referring Janesa Dockery: BABAOFF, MARCUS Treating Shylin Keizer/Extender: Tito Dine in Treatment: 8 Clinic Level of Care  Assessment Items TOOL 4 Quantity Score []  - Use when only an EandM is performed on FOLLOW-UP visit 0 ASSESSMENTS - Nursing Assessment / Reassessment X - Reassessment of Co-morbidities (includes updates in patient status) 1 10 X- 1 5 Reassessment of Adherence to Treatment Plan ASSESSMENTS - Wound and Skin Assessment / Reassessment X - Simple Wound Assessment / Reassessment - one wound 1 5 []  - 0 Complex Wound Assessment / Reassessment - multiple wounds []  - 0 Dermatologic / Skin Assessment (not related to wound area) ASSESSMENTS - Focused Assessment []  - Circumferential Edema Measurements - multi extremities 0 []  - 0 Nutritional Assessment / Counseling / Intervention []  - 0 Lower Extremity Assessment (monofilament, tuning fork, pulses) []  - 0 Peripheral Arterial Disease Assessment (using hand held doppler) ASSESSMENTS - Ostomy and/or Continence Assessment and Care []  - Incontinence Assessment and Management 0 []  - 0 Ostomy Care Assessment and Management (repouching, etc.) PROCESS - Coordination of Care X - Simple Patient / Family Education for ongoing care 1 15 []  - 0 Complex (extensive) Patient / Family Education for ongoing care []  - 0 Staff obtains Programmer, systems, Records, Test Results / Process Orders []  - 0 Staff telephones HHA, Nursing Homes / Clarify orders / etc []  - 0 Routine Transfer to another Facility (non-emergent condition) []  - 0 Routine Hospital Admission (non-emergent condition) []  - 0 New Admissions / Biomedical engineer / Ordering NPWT, Apligraf, etc. []  - 0 Emergency Hospital Admission (emergent condition) X- 1 10 Simple Discharge Coordination []  - 0 Complex (extensive) Discharge Coordination PROCESS - Special Needs []  - Pediatric / Minor Patient Management 0 []  - 0 Isolation Patient Management []  - 0 Hearing / Language / Visual special needs []  - 0 Assessment of Community assistance (transportation, D/C planning, etc.) []  - 0 Additional  assistance / Altered mentation []  - 0 Support Surface(s) Assessment (bed, cushion, seat, etc.) INTERVENTIONS - Wound Cleansing / Measurement Forstner, Shashana C. ([id number]) X- 1 5 Simple Wound Cleansing - one  wound []  - 0 Complex Wound Cleansing - multiple wounds X- 1 5 Wound Imaging (photographs - any number of wounds) []  - 0 Wound Tracing (instead of photographs) X- 1 5 Simple Wound Measurement - one wound []  - 0 Complex Wound Measurement - multiple wounds INTERVENTIONS - Wound Dressings []  - Small Wound Dressing one or multiple wounds 0 []  - 0 Medium Wound Dressing one or multiple wounds X- 1 20 Large Wound Dressing one or multiple wounds []  - 0 Application of Medications - topical []  - 0 Application of Medications - injection INTERVENTIONS - Miscellaneous []  - External ear exam 0 []  - 0 Specimen Collection (cultures, biopsies, blood, body fluids, etc.) []  - 0 Specimen(s) / Culture(s) sent or taken to Lab for analysis []  - 0 Patient Transfer (multiple staff / Civil Service fast streamer / Similar devices) []  - 0 Simple Staple / Suture removal (25 or less) []  - 0 Complex Staple / Suture removal (26 or more) []  - 0 Hypo / Hyperglycemic Management (close monitor of Blood Glucose) []  - 0 Ankle / Brachial Index (ABI) - do not check if billed separately X- 1 5 Vital Signs Has the patient been seen at the hospital within the last three years: Yes Total Score: 85 Level Of Care: New/Established - Level 3 Electronic Signature(s) Signed: 03/25/2020 1:40:27 PM By: Gretta Cool, BSN, RN, [name], Kim RN, BSN Entered By: Gretta Cool, BSN, RN, [name], Kim on 03/24/2020 12:55:08 [name] ([id number]) -------------------------------------------------------------------------------- Encounter Discharge Information Details [name]. Date of Service: 03/24/2020 12:30 PM Medical Record Number: [id number] Patient Account Number: [id number] Date of Birth/Sex: Apr 24, 1926 (86 y.o. F) Treating  RN: Cornell Barman Primary Care Bearl Talarico: Derinda Late Other Clinician: Referring Minas Bonser: BABAOFF, MARCUS Treating Ellon Marasco/Extender: Tito Dine in Treatment: 8 Encounter Discharge Information Items Discharge Condition: Stable Ambulatory Status: Ambulatory Discharge Destination: Home Transportation: Private Auto Accompanied By: self Schedule Follow-up Appointment: Yes Clinical Summary of Care: Electronic Signature(s) Signed: 03/25/2020 1:40:27 PM By: Gretta Cool, BSN, RN, [name], Kim RN, BSN Entered By: Gretta Cool, BSN, RN, [name], Kim on 03/24/2020 12:56:11 [name] ([id number]) -------------------------------------------------------------------------------- Lower Extremity Assessment Details [name], [name]. Date of Service: 03/24/2020 12:30 PM Medical Record Number: [id number] Patient Account Number: [id number] Date of Birth/Sex: 13-Nov-1925 (86 y.o. F) Treating RN: Montey Hora Primary Care Yanna Leaks: BABAOFF, MARCUS Other Clinician: Referring Indiyah Paone: BABAOFF, MARCUS Treating Esterlene Atiyeh/Extender: Ricard Dillon Weeks in Treatment: 8 Edema Assessment Assessed: [Left: No] [Right: No] Edema: [Left: N] [Right: o] Calf Left: Right: Point of Measurement: 30 cm From Medial Instep cm 26.5 cm Ankle Left: Right: Point of Measurement: 10 cm From Medial Instep cm 19 cm Vascular Assessment Pulses: Dorsalis Pedis Palpable: [Right:Yes] Electronic Signature(s) Signed: 03/24/2020 4:33:52 PM By: Montey Hora Entered By: Montey Hora on 03/24/2020 12:48:26 Pelc, [name] ([id number]) -------------------------------------------------------------------------------- Multi Wound Chart Details [name]. Date of Service: 03/24/2020 12:30 PM Medical Record Number: [id number] Patient Account Number: [id number] Date of Birth/Sex: 04-May-1926 (86 y.o. F) Treating RN: Cornell Barman Primary Care Salem Lembke: Derinda Late Other Clinician: Referring  Yasin Ducat: BABAOFF, MARCUS Treating Estella Malatesta/Extender: Ricard Dillon Weeks in Treatment: 8 Vital Signs Height(in): 62 Pulse(bpm): 88 Weight(lbs): 63 Blood Pressure(mmHg): 166/92 Body Mass Index(BMI): 18 Temperature(F): 97.9 Respiratory Rate(breaths/min): 16 Photos: [N/A:N/A] Wound Location: Right, Lateral Lower Leg N/A N/A Wounding Event: Trauma N/A N/A Primary Etiology: Venous Leg Ulcer N/A N/A Secondary Etiology: Trauma, Other N/A N/A Comorbid History: Cataracts, Deep Vein Thrombosis, N/A N/A Hypertension, Osteoarthritis, Dementia Date Acquired: 01/20/2020  N/A N/A Weeks of Treatment: 8 N/A N/A Wound Status: Open N/A N/A Measurements L x W x D (cm) 2.1x1.9x0.1 N/A N/A Area (cm) : 3.134 N/A N/A Volume (cm) : 0.313 N/A N/A % Reduction in Area: 77.80% N/A N/A % Reduction in Volume: 88.90% N/A N/A Classification: Full Thickness Without Exposed N/A N/A Support Structures Exudate Amount: Medium N/A N/A Exudate Type: Purulent N/A N/A Exudate Color: yellow, brown, green N/A N/A Wound Margin: Flat and Intact N/A N/A Granulation Amount: Large (67-100%) N/A N/A Granulation Quality: Pink, Hyper-granulation N/A N/A Necrotic Amount: Small (1-33%) N/A N/A Exposed Structures: Fat Layer (Subcutaneous Tissue) N/A N/A Exposed: Yes Fascia: No Tendon: No Muscle: No Joint: No Bone: No Epithelialization: None N/A N/A Treatment Notes Wound #3 (Right, Lateral Lower Leg) Notes hblue, ABD, kerlix, coban [name], [name] ([id number]) Electronic Signature(s) Signed: 03/24/2020 4:41:32 PM By: Linton Ham MD Entered By: Linton Ham on 03/24/2020 13:03:06 [name] ([id number]) -------------------------------------------------------------------------------- [place] Details [name]. Date of Service: 03/24/2020 12:30 PM Medical Record Number: [id number] Patient Account Number: [id number] Date of Birth/Sex: June 15, 1926 (86 y.o.  F) Treating RN: Cornell Barman Primary Care Zakarie Sturdivant: Derinda Late Other Clinician: Referring Melva Faux: BABAOFF, MARCUS Treating Emory Gallentine/Extender: Tito Dine in Treatment: 8 Active Inactive Necrotic Tissue Nursing Diagnoses: Impaired tissue integrity related to necrotic/devitalized tissue Goals: Necrotic/devitalized tissue will be minimized in the wound bed Date Initiated: 01/28/2020 Target Resolution Date: 02/04/2020 Goal Status: Active Interventions: Assess patient pain level pre-, during and post procedure and prior to discharge Treatment Activities: Apply topical anesthetic as ordered : 01/28/2020 Notes: Orientation to the Wound Care Program Nursing Diagnoses: Knowledge deficit related to the wound healing center program Goals: Patient/caregiver will verbalize understanding of the [place] Date Initiated: 01/28/2020 Target Resolution Date: 02/04/2020 Goal Status: Active Interventions: Provide education on orientation to the wound center Notes: Soft Tissue Infection Nursing Diagnoses: Impaired tissue integrity Goals: Patient will remain free of wound infection Date Initiated: 01/28/2020 Target Resolution Date: 02/11/2020 Goal Status: Active Interventions: Assess signs and symptoms of infection every visit Notes: Wound/Skin Impairment Nursing Diagnoses: Impaired tissue integrity Goals: Ulcer/skin breakdown will have a volume reduction of 30% by week 4 [name], [name] ([id number]) Date Initiated: 01/28/2020 Target Resolution Date: 02/27/2020 Goal Status: Active Interventions: Assess ulceration(s) every visit Treatment Activities: Skin care regimen initiated : 01/28/2020 Topical wound management initiated : 01/28/2020 Notes: Electronic Signature(s) Signed: 03/25/2020 1:40:27 PM By: Gretta Cool, BSN, RN, [name], Kim RN, BSN Entered By: Gretta Cool, BSN, RN, [name], Kim on 03/24/2020 12:53:26 Holland, [name]  ([id number]) -------------------------------------------------------------------------------- Pain Assessment Details [name]. Date of Service: 03/24/2020 12:30 PM Medical Record Number: [id number] Patient Account Number: [id number] Date of Birth/Sex: 07-31-1926 (86 y.o. F) Treating RN: Montey Hora Primary Care Darien Kading: Derinda Late Other Clinician: Referring Nili Honda: BABAOFF, MARCUS Treating Rhodia Acres/Extender: Ricard Dillon Weeks in Treatment: 8 Active Problems Location of Pain Severity and Description of Pain Patient Has Paino Yes Site Locations Pain Location: Pain in Ulcers With Dressing Change: Yes Duration of the Pain. Constant / Intermittento Constant Pain Management and Medication Current Pain Management: Electronic Signature(s) Signed: 03/24/2020 4:33:52 PM By: Montey Hora Entered By: Montey Hora on 03/24/2020 12:44:14 Guderian, [name] ([id number]) -------------------------------------------------------------------------------- Patient/Caregiver Education Details [name] Date of Service: 03/24/2020 12:30 PM Medical Record Number: [id number] Patient Account Number: [id number] Date of Birth/Gender: [birth year] (86 y.o. F) Treating RN: Cornell Barman Primary Care Physician: Derinda Late Other Clinician: Referring Physician: BABAOFF,  MARCUS Treating Physician/Extender: Tito Dine in Treatment: 8 Education Assessment Education Provided To: Patient Education Topics Provided Wound/Skin Impairment: Handouts: Caring for Your Ulcer Methods: Demonstration, Explain/Verbal Responses: State content correctly Electronic Signature(s) Signed: 03/25/2020 1:40:27 PM By: Gretta Cool, BSN, RN, [name], Kim RN, BSN Entered By: Gretta Cool, BSN, RN, [name], Kim on 03/24/2020 12:55:36 [name] ([id number]) -------------------------------------------------------------------------------- Wound Assessment Details Patient Name:  [name], [name]. Date of Service: 03/24/2020 12:30 PM Medical Record Number: [id number] Patient Account Number: [id number] Date of Birth/Sex: January 08, 1926 (86 y.o. F) Treating RN: Montey Hora Primary Care Lakenya Riendeau: BABAOFF, MARCUS Other Clinician: Referring Tyneshia Stivers: BABAOFF, MARCUS Treating Itzamar Traynor/Extender: Ricard Dillon Weeks in Treatment: 8 Wound Status Wound Number: 3 Primary Venous Leg Ulcer Etiology: Wound Location: Right, Lateral Lower Leg Secondary Trauma, Other Wounding Event: Trauma Etiology: Date Acquired: 01/20/2020 Wound Status: Open Weeks Of Treatment: 8 Comorbid Cataracts, Deep Vein Thrombosis, Hypertension, Clustered Wound: No History: Osteoarthritis, Dementia Photos Wound Measurements Length: (cm) 2.1 Width: (cm) 1.9 Depth: (cm) 0.1 Area: (cm) 3.134 Volume: (cm) 0.313 % Reduction in Area: 77.8% % Reduction in Volume: 88.9% Epithelialization: None Tunneling: No Undermining: No Wound Description Classification: Full Thickness Without Exposed Support Structu Wound Margin: Flat and Intact Exudate Amount: Medium Exudate Type: Purulent Exudate Color: yellow, brown, green res Foul Odor After Cleansing: No Slough/Fibrino Yes Wound Bed Granulation Amount: Large (67-100%) Exposed Structure Granulation Quality: Pink, Hyper-granulation Fascia Exposed: No Necrotic Amount: Small (1-33%) Fat Layer (Subcutaneous Tissue) Exposed: Yes Necrotic Quality: Adherent Slough Tendon Exposed: No Muscle Exposed: No Joint Exposed: No Bone Exposed: No Treatment Notes Wound #3 (Right, Lateral Lower Leg) Notes hblue, ABD, kerlix, coban Electronic Signature(s) [name], [name] ([id number]) Signed: 03/24/2020 4:33:52 PM By: Montey Hora Entered By: Montey Hora on 03/24/2020 12:46:37 Ellinger, [name] ([id number]) -------------------------------------------------------------------------------- Vitals Details [name]. Date of Service:  03/24/2020 12:30 PM Medical Record Number: [id number] Patient Account Number: [id number] Date of Birth/Sex: [birth year] (86 y.o. F) Treating RN: Cornell Barman Primary Care Denarius Sesler: BABAOFF, MARCUS Other Clinician: Referring Shatasia Cutshaw: BABAOFF, MARCUS Treating Athea Haley/Extender: Ricard Dillon Weeks in Treatment: 8 Vital Signs Time Taken: 12:35 Temperature (F): 97.9 Height (in): 62 Pulse (bpm): 88 Weight (lbs): 98 Respiratory Rate (breaths/min): 16 Body Mass Index (BMI): 17.9 Blood Pressure (mmHg): 166/92 Reference Range: 80 - 120 mg / dl Electronic Signature(s) Signed: 03/24/2020 4:11:28 PM By: Lorine Bears RCP, RRT, CHT Entered By: Lorine Bears on 03/24/2020 12:40:38

## 2020-03-31 ENCOUNTER — Encounter: Payer: Medicare Other | Admitting: Internal Medicine

## 2020-03-31 ENCOUNTER — Other Ambulatory Visit: Payer: Self-pay

## 2020-03-31 DIAGNOSIS — L97812 Non-pressure chronic ulcer of other part of right lower leg with fat layer exposed: Secondary | ICD-10-CM | POA: Diagnosis not present

## 2020-04-02 NOTE — Progress Notes (Signed)
SHATIKA, GRINNELL ([id number]) Visit Report for 03/31/2020 HPI Details [name], [name]. Date of Service: 03/31/2020 1:45 PM Medical Record Number: [id number] Patient Account Number: [id number] Date of Birth/Sex: October 31, 1925 (86 y.o. F) Treating RN: Cornell Barman Primary Care Provider: Derinda Late Other Clinician: Referring Provider: BABAOFF, MARCUS Treating Provider/Extender: Tito Dine in Treatment: 9 History of Present Illness HPI Description: 06/06/17 on evaluation today patient presents for evaluation concerning an ulcer which she initially had arise as a result of striking her left lower extremity money bed frame. With that being said this was roughly one month ago and unfortunately the wound despite two courses of Keflex have not really made a dramatic improvement. This has continued to drain as well as being exquisitely tender at times. Even to the point that she is having a difficult time walking. Patient does have chronic atrial fibrillation which subsequently leads to her being on long-term anticoagulant therapy and this causes ED bruising which may have contributed to this entry and where things are at this point. She does have valvular heart disease as well as hypertension and her last INR was 2.7. Currently Neosporin, Gauls, and an ace wrap has been utilized. Keflex is the antibiotic that she has been on. Patient has not had a culture up to this point and she states that her blood pressure at the primary care office yesterday was 138/72. Her pain is significant related to be an eight out of 10. No fevers, chills, nausea, or vomiting noted at this time. 06/20/17; patient was admitted here 2 weeks ago. She had a traumatic wound on her left lateral lower leg probably in the setting of some degree of venous insufficiency with surrounding cellulitis. She had been on Keflex, after we saw her she was put on doxycycline which she apparently did not tolerate. Culture  that was done at the time showed Morganella which was resistant to Keflex. Apparently the patient continued to take Keflex after the appointment. She is also on Coumadin. I had tried to change her antibiotic when the Morganella culture was brought to my attention however apparently our staff could not reach the patient to inform them of the antibiotic change 06/26/17; patient has wound on her left lateral lower extremity in the setting of some degree of venous insufficiency. I gave her cefdinir last week and she is completing this. There is no evidence of surrounding infection. Aggressive debridement last week, wound bed looks healthier. We've been using Aquacel. They're traveling in the [place] next week we'll see her back in 2 weeks unless there are problems. 07/10/17; the patient is been using Aquacel Ag her husband is changing this with Kerlix and conform. She still complains of a lot of pain. Apparently with the antibiotics. We gave 2 or 3 weeks ago. Her INR went up to 6, this was managed by her primary physician 07/17/17; she is using Aquacel Ag and a border foam. Still discomfort although dimensions are better. 07/24/17; patient or for review of a trauma wound on her left anterior leg in the setting of some degree of chronic venous insufficiency she has been using Aquacel Ag and a border foam and making progress. 07/31/17; only a small open area of this original significant trauma is still open. Her husband is changing this every second day [Aquacel Ag] Readmission: 01/14/19 on evaluation today patient is that she seen for initial inspection during the office visit today concerning a trauma/skin tear to the left anterior lower extremity. This is roughly  the same region where she was previously treated and years past when she was here in the clinic. Nonetheless upon evaluation today the patient's wound actually showed signs of decent granulation around the edge although unfortunately the skin flap  that was torn back did fold under on itself and the flap itself is dying. She does have some eschar surrounded the edges of the wound at this point. Nonetheless this also was very tender for her. The patient does have a history of hypertension, atrial fibrillation, and long-term use anticoagulant therapy. 01/21/19 on evaluation today patient's wound on the anterior portion of her lower extremity appears to still be necrotic as far as the surface of the wound is concerned. Unfortunately there is no significant improvement at this point compared to last week they were not able to get the Santyl that are using Medihoney and maybe one reason why. The other is I think this may be stained to drive. Possibly adding something such as mepitel over top of the Medihoney may help to loosen this up quite a bit which I think would be in her best interest. Also she still has a lot of erythema and is very tender to touch I'm afraid that we may need to switch to a different antibiotic to see if this will be of benefit there still really nothing that I can culture to get a better picture of what's going on and what we need to do from the standpoint of antibiotics. I do believe the patient needs a referral Talmuds me to vascular in order to evaluate her blood flow based on what I'm seeing they will be able to perform TBI testing if nothing else along with the vascular evaluation to see if there's any evidence for limited blood flow to this lower extremity. 4/8; I have reviewed the patient's arterial studies and they are really quite good. There should be no arterial impediments to healing the wound on the right anterior tibial area. 4/15; patient has a small open area over the left mid tibial area. Still requiring debridement we are using Santyl here and Medihoney at home [Santyl unaffordable] 4/22; difficult, presumably traumatic area over the left mid tibial area in the setting of chronic venous insufficiency. We have  been using Santyl here and meta honey at home. The wound surface is getting gradually better and slight improvement in dimensions. Her husband is changing this daily 4/29; left mid tibia presumably traumatic wound in the setting of chronic venous insufficiency. We have been using Santyl and a combination of Medihoney at home. Arrives today with a much better looking healthy granulated surface. We will change the primary dressing to [facility] 5/6; left mid tibia smaller reasonably healthy looking wound using Hydrofera Blue since last week 5/20-Patient returns to clinic with a left mid tibial wound looking better, we have been using Hydrofera Blue since the previous week 5/27; patient returns to clinic with a left mid tibial wound covered in raised eschar. We have been using Hydrofera Blue her husband changes the dressings using a border foam cover READMISSION 01/28/20 [name] is a patient who is now 86 years old. As usual she comes in with her husband who is her primary caregiver. They were here last from March through May 2020 with a traumatic wound on the left anterior tibia we eventually heal this out. She has chronic venous insufficiency. She is also here previously in 2018. ANABELA, CRAYTON ([id number]) Her current problem started 8 days ago. She went in  the hospital for a thoracic level kyphoplasty. The procedure she developed a skin tear on her leg. This was mentioned to her. Her husband is applying Medihoney to the area and they are in for our review of this. She finds this much too uncomfortable to attempt ABIs although her ABIs when she was in the clinic last year were quite normal bilaterally Past medical history she is not a diabetic. As noted she had a T9 and T6 kyphoplasty, Alzheimer's disease, chronic atrial fibrillation on Coumadin and hypertension 4/14; patient arrives in clinic having removed part of the wrap. We attempted to leave this on all week. Unfortunately none  that none of the tissue that looks semiviable last week has remained viable. She required an extensive debridement. We have been using silver alginate 4/21; the patient arrived today with tremendous erythema around the large part of the wound on the right lateral leg. This extends medially and down towards her foot. I marked the area but I think the extent of the likely cellulitis is beyond what can be safely managed as an outpatient. 02/23/2020 upon evaluation today patient is seen today though she is typically followed by Dr. Dellia Nims. Last time he saw her he sent her to the hospital and she subsequently was in the hospital for some time. In fact it was from 02/11/2020 through 02/19/2020. During that time she was on antibiotic therapy by way of IV antibiotics. Her leg does appear to be doing better today compared to what we saw then. Nonetheless she had a culture that was obtained while she was in the hospital by Dr. Steva Ready. This ended up not growing anything which is not really surprising considering she was on antibiotics which were fairly strong at the time of culture. There was no growth at all after 2 days. With that being said Dr. Steva Ready apparently discussed with Dr. Dellia Nims per her note anyway that she wanted the patient off of the antibiotics for a week and then to repeat a culture in the outpatient setting. She also has a suspicion for the possibility of pyoderma and has referred the patient to Dr. Nehemiah Massed and the patient is supposed to be seeing him sometime I believe Thursday of this week. Nonetheless I explained that pyoderma can sometimes begin as a injury or insult that then worsens. Obviously the initial injury here appears to have been a scraping of her leg that occurred when the patient was being transferred off of the table after a kyphoplasty procedure. Fortunately again as mentioned the infection seems to be doing significantly better based on what I am seeing at this point  compared to previous pictures that I reviewed today. 5/5; the patient is back to my clinic today after seeing with stone 2 days ago. She is on [employer] with a Kerlix Coban I have reviewed her recent history. We had sent her to the hospital with presumed cellulitis considerable erythema in the lateral leg. She received prolonged courses of antibiotics in the hospital. I received a call from one of the hospitalist to report that he didn't feel that she was responding to antibiotics although as noted 2 days ago by Jeri Cos the wound actually looks a lot better along with the periwound skin which is not nearly as erythematous. He raised the possibility of pyoderma gangrenosum which also had been brought up by Dr. Steva Ready the infectious disease consultant. The discharge culture was negative but she was still on antibiotics The pyoderma gangrenosum apparently has something to do  with the fact that both her sons have Crohn's disease although the patient does not have Crohn's disease. I told the doctor that call me I didn't really think this fit with the history which was a trauma induced wound why the patient was having a kyphoplasty. In fact her husband is still very angry about this with the hospital. She also has severe chronic venous insufficiency with hemosiderin and stasis dermatitis and has had wounds in both legs. 5/12; venous insufficiency wound on the right lower lateral lower leg initially trauma. Still debris on the surface of this wound that undergoes a difficult debridement. We have been using Hydrofera Blue 5/19; venous insufficiency wound on the right lower lateral leg initially trauma. Better looking wound surface and a rim of epithelialization. Surface area is smaller we have been using Hydrofera Blue under compression 03/17/20-Patient returns at 1 week, the wound is looking stable, dimensions are slightly smaller, continue Hydrofera Blue and Santyl under compression 6/2; venous  insufficiency wound on the right lower leg initially traumatic. She has a considerably better looking wound surface than when I saw this 2 weeks ago. Some hyper granulation. We have been using Santyl with [employer] cover. I no longer think the Santyl is necessary we will just use Hydrofera Blue kerlix Coban compression They are going to be traveling including to the [facility] and then subsequently a family vacation in [place]. Her daughter-in-law stated that they would be able to get her in here in 2 weeks before they go to [place] I think that would be good plan. We will show her how to do the dressing including the Kerlix Coban 6/9; venous insufficiency wound on the right lower leg initially traumatic. Complicated by infection requiring hospitalization. Since then she has done very nicely and the wound has been getting progressively smaller we have been using Hydrofera Blue under Kerlix and [employer]) Signed: 03/31/2020 4:11:49 PM By: Linton Ham MD Entered By: Linton Ham on 03/31/2020 14:37:20 [name], [name] ([id number]) -------------------------------------------------------------------------------- Physical Exam Details [name]. Date of Service: 03/31/2020 1:45 PM Medical Record Number: [id number] Patient Account Number: [id number] Date of Birth/Sex: 07-Oct-1926 (86 y.o. F) Treating RN: Cornell Barman Primary Care Provider: Derinda Late Other Clinician: Referring Provider: BABAOFF, MARCUS Treating Provider/Extender: Ricard Dillon Weeks in Treatment: 9 Constitutional Patient is hypertensive.. Pulse regular and within target range for patient.Marland Kitchen Respirations regular, non-labored and within target range.. Temperature is normal and within the target range for the patient.Marland Kitchen appears in no distress. Cardiovascular Pedal pulses are palpable. We have excellent edema control. Notes Wound exam; right anterior lower leg  wound. The patient's wound is nicely granulated there is a rim of epithelialization. Nice improvement in surface area. Electronic Signature(s) Signed: 03/31/2020 4:11:49 PM By: Linton Ham MD Entered By: Linton Ham on 03/31/2020 14:38:22 [name] ([id number]) -------------------------------------------------------------------------------- Physician Orders Details [name] Date of Service: 03/31/2020 1:45 PM Medical Record Number: [id number] Patient Account Number: [id number] Date of Birth/Sex: [birth year] (86 y.o. F) Treating RN: Cornell Barman Primary Care Provider: BABAOFF, MARCUS Other Clinician: Referring Provider: BABAOFF, MARCUS Treating Provider/Extender: Tito Dine in Treatment: 9 Verbal / Phone Orders: No Diagnosis Coding Wound Cleansing Wound #3 Right,Lateral Lower Leg o Cleanse wound with mild soap and water Anesthetic (add to Medication List) Wound #3 Right,Lateral Lower Leg o Topical Lidocaine 4% cream applied to wound bed prior to debridement (In Clinic Only). Primary Wound Dressing o Hydrafera Blue Ready Transfer Secondary  Dressing Wound #3 Right,Lateral Lower Leg o ABD pad o Kerlix and Coban Dressing Change Frequency Wound #3 Right,Lateral Lower Leg o Change Dressing Monday, Wednesday, Friday Follow-up Appointments Wound #3 Right,Lateral Lower Leg o Return Appointment in 1 week. Edema Control Wound #3 Right,Lateral Lower Leg o Kerlix and Coban - Right Lower Extremity o Elevate legs to the level of the heart and pump ankles as often as possible Additional Orders / Instructions Wound #3 Right,Lateral Lower Leg o Increase protein intake. Home Health Wound #3 [place] Visits - Monday and Friday (Wednesdays in clinic) [facility] Nurse may visit PRN to address patientos wound care needs. o FACE TO FACE ENCOUNTER: MEDICARE and MEDICAID PATIENTS: I  certify that this patient is under my care and that I had a face-to-face encounter that meets the physician face-to-face encounter requirements with this patient on this date. The encounter with the patient was in whole or in part for the following MEDICAL CONDITION: (primary reason for [place]) MEDICAL NECESSITY: I certify, that based on my findings, NURSING services are a medically necessary home health service. HOME BOUND STATUS: I certify that my clinical findings support that this patient is homebound (i.e., Due to illness or injury, pt requires aid of supportive devices such as crutches, cane, wheelchairs, walkers, the use of special transportation or the assistance of another person to leave their place of residence. There is a normal inability to leave the home and doing so requires considerable and taxing effort. Other absences are for medical reasons / religious services and are infrequent or of short duration when for other reasons). o If current dressing causes regression in wound condition, may D/C ordered dressing product/s and apply Normal Saline Moist Dressing daily until next [place] / Other MD appointment. [place] of regression in wound condition at [phone]. o Please direct any NON-WOUND related issues/requests for orders to patient's Primary Care Physician STORMEY, WILBORN ([id number]) Electronic Signature(s) Signed: 03/31/2020 4:11:49 PM By: Linton Ham MD Signed: 04/02/2020 12:58:54 PM By: Gretta Cool, BSN, RN, [name], Kim RN, BSN Entered By: Gretta Cool, BSN, RN, [name], Kim on 03/31/2020 14:21:07 [name] ([id number]) -------------------------------------------------------------------------------- Problem List Details Patient Name: AURORE, REDINGER. Date of Service: 03/31/2020 1:45 PM Medical Record Number: [id number] Patient Account Number: [id number] Date of Birth/Sex: 1926/06/11 (86 y.o. F) Treating RN: Cornell Barman Primary  Care Provider: Derinda Late Other Clinician: Referring Provider: BABAOFF, MARCUS Treating Provider/Extender: Tito Dine in Treatment: 9 Active Problems ICD-10 Encounter Code Description Active Date MDM Diagnosis S81.811D Laceration without foreign body, right lower leg, subsequent encounter 01/28/2020 No Yes L97.812 Non-pressure chronic ulcer of other part of right lower leg with fat layer 01/28/2020 No Yes exposed I87.321 Chronic venous hypertension (idiopathic) with inflammation of right 01/28/2020 No Yes lower extremity Inactive Problems Resolved Problems Electronic Signature(s) Signed: 03/31/2020 4:11:49 PM By: Linton Ham MD Entered By: Linton Ham on 03/31/2020 14:36:34 [name], [name] ([id number]) -------------------------------------------------------------------------------- Progress Note Details [name]. Date of Service: 03/31/2020 1:45 PM Medical Record Number: [id number] Patient Account Number: [id number] Date of Birth/Sex: 07-03-1926 (86 y.o. F) Treating RN: Cornell Barman Primary Care Provider: Derinda Late Other Clinician: Referring Provider: BABAOFF, MARCUS Treating Provider/Extender: Ricard Dillon Weeks in Treatment: 9 Subjective History of Present Illness (HPI) 06/06/17 on evaluation today patient presents for evaluation concerning an ulcer which she initially had arise as a result of striking her left lower extremity money bed frame. With  that being said this was roughly one month ago and unfortunately the wound despite two courses of Keflex have not really made a dramatic improvement. This has continued to drain as well as being exquisitely tender at times. Even to the point that she is having a difficult time walking. Patient does have chronic atrial fibrillation which subsequently leads to her being on long-term anticoagulant therapy and this causes ED bruising which may have contributed to this entry and where things  are at this point. She does have valvular heart disease as well as hypertension and her last INR was 2.7. Currently Neosporin, Gauls, and an ace wrap has been utilized. Keflex is the antibiotic that she has been on. Patient has not had a culture up to this point and she states that her blood pressure at the primary care office yesterday was 138/72. Her pain is significant related to be an eight out of 10. No fevers, chills, nausea, or vomiting noted at this time. 06/20/17; patient was admitted here 2 weeks ago. She had a traumatic wound on her left lateral lower leg probably in the setting of some degree of venous insufficiency with surrounding cellulitis. She had been on Keflex, after we saw her she was put on doxycycline which she apparently did not tolerate. Culture that was done at the time showed Morganella which was resistant to Keflex. Apparently the patient continued to take Keflex after the appointment. She is also on Coumadin. I had tried to change her antibiotic when the Morganella culture was brought to my attention however apparently our staff could not reach the patient to inform them of the antibiotic change 06/26/17; patient has wound on her left lateral lower extremity in the setting of some degree of venous insufficiency. I gave her cefdinir last week and she is completing this. There is no evidence of surrounding infection. Aggressive debridement last week, wound bed looks healthier. We've been using Aquacel. They're traveling in the [place] next week we'll see her back in 2 weeks unless there are problems. 07/10/17; the patient is been using Aquacel Ag her husband is changing this with Kerlix and conform. She still complains of a lot of pain. Apparently with the antibiotics. We gave 2 or 3 weeks ago. Her INR went up to 6, this was managed by her primary physician 07/17/17; she is using Aquacel Ag and a border foam. Still discomfort although dimensions are better. 07/24/17; patient or  for review of a trauma wound on her left anterior leg in the setting of some degree of chronic venous insufficiency she has been using Aquacel Ag and a border foam and making progress. 07/31/17; only a small open area of this original significant trauma is still open. Her husband is changing this every second day [Aquacel Ag] Readmission: 01/14/19 on evaluation today patient is that she seen for initial inspection during the office visit today concerning a trauma/skin tear to the left anterior lower extremity. This is roughly the same region where she was previously treated and years past when she was here in the clinic. Nonetheless upon evaluation today the patient's wound actually showed signs of decent granulation around the edge although unfortunately the skin flap that was torn back did fold under on itself and the flap itself is dying. She does have some eschar surrounded the edges of the wound at this point. Nonetheless this also was very tender for her. The patient does have a history of hypertension, atrial fibrillation, and long-term use anticoagulant therapy. 01/21/19 on evaluation  today patient's wound on the anterior portion of her lower extremity appears to still be necrotic as far as the surface of the wound is concerned. Unfortunately there is no significant improvement at this point compared to last week they were not able to get the Santyl that are using Medihoney and maybe one reason why. The other is I think this may be stained to drive. Possibly adding something such as mepitel over top of the Medihoney may help to loosen this up quite a bit which I think would be in her best interest. Also she still has a lot of erythema and is very tender to touch I'm afraid that we may need to switch to a different antibiotic to see if this will be of benefit there still really nothing that I can culture to get a better picture of what's going on and what we need to do from the standpoint of  antibiotics. I do believe the patient needs a referral Talmuds me to vascular in order to evaluate her blood flow based on what I'm seeing they will be able to perform TBI testing if nothing else along with the vascular evaluation to see if there's any evidence for limited blood flow to this lower extremity. 4/8; I have reviewed the patient's arterial studies and they are really quite good. There should be no arterial impediments to healing the wound on the right anterior tibial area. 4/15; patient has a small open area over the left mid tibial area. Still requiring debridement we are using Santyl here and Medihoney at home [Santyl unaffordable] 4/22; difficult, presumably traumatic area over the left mid tibial area in the setting of chronic venous insufficiency. We have been using Santyl here and meta honey at home. The wound surface is getting gradually better and slight improvement in dimensions. Her husband is changing this daily 4/29; left mid tibia presumably traumatic wound in the setting of chronic venous insufficiency. We have been using Santyl and a combination of Medihoney at home. Arrives today with a much better looking healthy granulated surface. We will change the primary dressing to [facility] 5/6; left mid tibia smaller reasonably healthy looking wound using Hydrofera Blue since last week 5/20-Patient returns to clinic with a left mid tibial wound looking better, we have been using Hydrofera Blue since the previous week 5/27; patient returns to clinic with a left mid tibial wound covered in raised eschar. We have been using Hydrofera Blue her husband changes the dressings using a border foam cover READMISSION 01/28/20 [name] is a patient who is now 86 years old. As usual she comes in with her husband who is her primary caregiver. They were here last from March through May 2020 with a traumatic wound on the left anterior tibia we eventually heal this out. She has chronic  venous insufficiency. She is also here previously in 2018. Her current problem started 8 days ago. She went in the hospital for a thoracic level kyphoplasty. The procedure she developed a skin tear on her leg. This was mentioned to her. Her husband is applying Medihoney to the area and they are in for our review of this. She finds this much too uncomfortable to attempt ABIs although her ABIs when she was in the clinic last year were quite normal bilaterally [name], [name] ([id number]) Past medical history she is not a diabetic. As noted she had a T9 and T6 kyphoplasty, Alzheimer's disease, chronic atrial fibrillation on Coumadin and hypertension 4/14; patient arrives in clinic  having removed part of the wrap. We attempted to leave this on all week. Unfortunately none that none of the tissue that looks semiviable last week has remained viable. She required an extensive debridement. We have been using silver alginate 4/21; the patient arrived today with tremendous erythema around the large part of the wound on the right lateral leg. This extends medially and down towards her foot. I marked the area but I think the extent of the likely cellulitis is beyond what can be safely managed as an outpatient. 02/23/2020 upon evaluation today patient is seen today though she is typically followed by Dr. Dellia Nims. Last time he saw her he sent her to the hospital and she subsequently was in the hospital for some time. In fact it was from 02/11/2020 through 02/19/2020. During that time she was on antibiotic therapy by way of IV antibiotics. Her leg does appear to be doing better today compared to what we saw then. Nonetheless she had a culture that was obtained while she was in the hospital by Dr. Steva Ready. This ended up not growing anything which is not really surprising considering she was on antibiotics which were fairly strong at the time of culture. There was no growth at all after 2 days. With that being said  Dr. Steva Ready apparently discussed with Dr. Dellia Nims per her note anyway that she wanted the patient off of the antibiotics for a week and then to repeat a culture in the outpatient setting. She also has a suspicion for the possibility of pyoderma and has referred the patient to Dr. Nehemiah Massed and the patient is supposed to be seeing him sometime I believe Thursday of this week. Nonetheless I explained that pyoderma can sometimes begin as a injury or insult that then worsens. Obviously the initial injury here appears to have been a scraping of her leg that occurred when the patient was being transferred off of the table after a kyphoplasty procedure. Fortunately again as mentioned the infection seems to be doing significantly better based on what I am seeing at this point compared to previous pictures that I reviewed today. 5/5; the patient is back to my clinic today after seeing with stone 2 days ago. She is on [employer] with a Kerlix Coban I have reviewed her recent history. We had sent her to the hospital with presumed cellulitis considerable erythema in the lateral leg. She received prolonged courses of antibiotics in the hospital. I received a call from one of the hospitalist to report that he didn't feel that she was responding to antibiotics although as noted 2 days ago by Jeri Cos the wound actually looks a lot better along with the periwound skin which is not nearly as erythematous. He raised the possibility of pyoderma gangrenosum which also had been brought up by Dr. Steva Ready the infectious disease consultant. The discharge culture was negative but she was still on antibiotics The pyoderma gangrenosum apparently has something to do with the fact that both her sons have Crohn's disease although the patient does not have Crohn's disease. I told the doctor that call me I didn't really think this fit with the history which was a trauma induced wound why the patient was having a  kyphoplasty. In fact her husband is still very angry about this with the hospital. She also has severe chronic venous insufficiency with hemosiderin and stasis dermatitis and has had wounds in both legs. 5/12; venous insufficiency wound on the right lower lateral lower leg initially trauma. Still debris  on the surface of this wound that undergoes a difficult debridement. We have been using Hydrofera Blue 5/19; venous insufficiency wound on the right lower lateral leg initially trauma. Better looking wound surface and a rim of epithelialization. Surface area is smaller we have been using Hydrofera Blue under compression 03/17/20-Patient returns at 1 week, the wound is looking stable, dimensions are slightly smaller, continue Hydrofera Blue and Santyl under compression 6/2; venous insufficiency wound on the right lower leg initially traumatic. She has a considerably better looking wound surface than when I saw this 2 weeks ago. Some hyper granulation. We have been using Santyl with [facility] cover. I no longer think the Santyl is necessary we will just use Hydrofera Blue kerlix Coban compression They are going to be traveling including to the [facility] and then subsequently a family vacation in [place]. Her daughter-in-law stated that they would be able to get her in here in 2 weeks before they go to [place] I think that would be good plan. We will show her how to do the dressing including the Kerlix Coban 6/9; venous insufficiency wound on the right lower leg initially traumatic. Complicated by infection requiring hospitalization. Since then she has done very nicely and the wound has been getting progressively smaller we have been using Hydrofera Blue under Kerlix and Coban Objective Constitutional Patient is hypertensive.. Pulse regular and within target range for patient.Marland Kitchen Respirations regular, non-labored and within target range.. Temperature is normal and within the target  range for the patient.Marland Kitchen appears in no distress. Vitals Time Taken: 1:45 PM, Height: 62 in, Weight: 98 lbs, BMI: 17.9, Temperature: 97.8 F, Pulse: 92 bpm, Respiratory Rate: 16 breaths/min, Blood Pressure: 150/70 mmHg. Cardiovascular Pedal pulses are palpable. We have excellent edema control. General Notes: Wound exam; right anterior lower leg wound. The patient's wound is nicely granulated there is a rim of epithelialization. Nice improvement in surface area. Integumentary (Hair, Skin) Wound #3 status is Open. Original cause of wound was Trauma. The wound is located on the Right,Lateral Lower Leg. The wound measures 1.6cm length x 1.5cm width x 0.1cm depth; 1.885cm^2 area and 0.188cm^3 volume. There is Fat Layer (Subcutaneous Tissue) Exposed exposed. There is no tunneling or undermining noted. There is a medium amount of serous drainage noted. The wound margin is flat and intact. There is large (67-100%) pink granulation within the wound bed. There is a small (1-33%) amount of necrotic tissue within the wound bed [name], [name]. ([id number]) including Adherent Slough. Assessment Active Problems ICD-10 Laceration without foreign body, right lower leg, subsequent encounter Non-pressure chronic ulcer of other part of right lower leg with fat layer exposed Chronic venous hypertension (idiopathic) with inflammation of right lower extremity Plan Wound Cleansing: Wound #3 Right,Lateral Lower Leg: Cleanse wound with mild soap and water Anesthetic (add to Medication List): Wound #3 Right,Lateral Lower Leg: Topical Lidocaine 4% cream applied to wound bed prior to debridement (In Clinic Only). Primary Wound Dressing: Hydrafera Blue Ready Transfer Secondary Dressing: Wound #3 Right,Lateral Lower Leg: ABD pad Kerlix and Coban Dressing Change Frequency: Wound #3 Right,Lateral Lower Leg: Change Dressing Monday, Wednesday, Friday Follow-up Appointments: Wound #3 Right,Lateral Lower  Leg: Return Appointment in 1 week. Edema Control: Wound #3 Right,Lateral Lower Leg: Kerlix and Coban - Right Lower Extremity Elevate legs to the level of the heart and pump ankles as often as possible Additional Orders / Instructions: Wound #3 Right,Lateral Lower Leg: Increase protein intake. Home Health: Wound #3 Right,Lateral Lower Leg: [place]  Visits - Monday and Friday (Wednesdays in clinic) [place] Nurse may visit PRN to address patient s wound care needs. FACE TO FACE ENCOUNTER: MEDICARE and MEDICAID PATIENTS: I certify that this patient is under my care and that I had a face-to-face encounter that meets the physician face-to-face encounter requirements with this patient on this date. The encounter with the patient was in whole or in part for the following MEDICAL CONDITION: (primary reason for [place]) MEDICAL NECESSITY: I certify, that based on my findings, NURSING services are a medically necessary home health service. HOME BOUND STATUS: I certify that my clinical findings support that this patient is homebound (i.e., Due to illness or injury, pt requires aid of supportive devices such as crutches, cane, wheelchairs, walkers, the use of special transportation or the assistance of another person to leave their place of residence. There is a normal inability to leave the home and doing so requires considerable and taxing effort. Other absences are for medical reasons / religious services and are infrequent or of short duration when for other reasons). If current dressing causes regression in wound condition, may D/C ordered dressing product/s and apply Normal Saline Moist Dressing daily until next [place] / Other MD appointment. [place] of regression in wound condition at [phone]. Please direct any NON-WOUND related issues/requests for orders to patient's Primary Care Physician 1. We are going to use Hydrofera Blue with  Kerlix Coban 2. Initially a difficult large traumatic wound in the setting of chronic venous insufficiency. This is now a healthy granulated surface that is progressing towards closure. 3. Her husband tells me they are going to [place] the last week of June and then sometime in early July either going to their home in [place]. I am hopeful the wound will be closed in that timeframe Electronic Signature(s) Signed: 03/31/2020 4:11:49 PM By: Linton Ham MD Entered By: Linton Ham on 03/31/2020 14:40:24 [name], [name] ([id number]) [name], [name] ([id number]) -------------------------------------------------------------------------------- SuperBill Details [name]. Date of Service: 03/31/2020 Medical Record Number: [id number] Patient Account Number: [id number] Date of Birth/Sex: 21-Sep-1926 (86 y.o. F) Treating RN: Cornell Barman Primary Care Provider: Derinda Late Other Clinician: Referring Provider: BABAOFF, MARCUS Treating Provider/Extender: Tito Dine in Treatment: 9 Diagnosis Coding ICD-10 Codes Code Description S81.811D Laceration without foreign body, right lower leg, subsequent encounter L97.812 Non-pressure chronic ulcer of other part of right lower leg with fat layer exposed I87.321 Chronic venous hypertension (idiopathic) with inflammation of right lower extremity Facility Procedures CPT4 Code: 29528413 Description: 99213 - WOUND CARE VISIT-LEV 3 EST PT Modifier: Quantity: 1 Physician Procedures CPT4 Code: 2440102 Description: 72536 - WC PHYS LEVEL 3 - EST PT Modifier: Quantity: 1 CPT4 Code: Description: ICD-10 Diagnosis Description U44.034 Non-pressure chronic ulcer of other part of right lower leg with fat l I87.321 Chronic venous hypertension (idiopathic) with inflammation of right lo Modifier: ayer exposed wer extremity Quantity: Electronic Signature(s) Signed: 03/31/2020 4:11:49 PM By: Linton Ham MD Entered By:  Linton Ham on 03/31/2020 14:40:48

## 2020-04-02 NOTE — Progress Notes (Signed)
[name], [name] ([id number]) Visit Report for 03/31/2020 Arrival Information Details [name], [name]. Date of Service: 03/31/2020 1:45 PM Medical Record Number: [id number] Patient Account Number: [id number] Date of Birth/Sex: 10/04/1926 (86 y.o. F) Treating RN: Cornell Barman Primary Care Shyenne Maggard: Derinda Late Other Clinician: Referring Yazmin Locher: BABAOFF, MARCUS Treating Blessing Ozga/Extender: Tito Dine in Treatment: 9 Visit Information History Since Last Visit Added or deleted any medications: No Patient Arrived: Ambulatory Any new allergies or adverse reactions: No Arrival Time: 13:45 Had a fall or experienced change in No Accompanied By: husband activities of daily living that may affect Transfer Assistance: None risk of falls: Patient Identification Verified: Yes Signs or symptoms of abuse/neglect since last visito No Secondary Verification Process Completed: Yes Hospitalized since last visit: No Patient Has Alerts: Yes Implantable device outside of the clinic excluding No Patient Alerts: Patient on Blood Thinner cellular tissue based products placed in the center warfarin since last visit: Has Dressing in Place as Prescribed: Yes Pain Present Now: No Electronic Signature(s) Signed: 04/01/2020 11:39:41 AM By: Lorine Bears RCP, RRT, CHT Entered By: Lorine Bears on 03/31/2020 13:46:51 [name], [name] ([id number]) -------------------------------------------------------------------------------- Clinic Level of Care Assessment Details [name]. Date of Service: 03/31/2020 1:45 PM Medical Record Number: [id number] Patient Account Number: [id number] Date of Birth/Sex: 1926-03-11 (86 y.o. F) Treating RN: Cornell Barman Primary Care Saretta Dahlem: BABAOFF, MARCUS Other Clinician: Referring Verneta Hamidi: BABAOFF, MARCUS Treating Zade Falkner/Extender: Tito Dine in Treatment: 9 Clinic Level of Care  Assessment Items TOOL 4 Quantity Score []  - Use when only an EandM is performed on FOLLOW-UP visit 0 ASSESSMENTS - Nursing Assessment / Reassessment X - Reassessment of Co-morbidities (includes updates in patient status) 1 10 X- 1 5 Reassessment of Adherence to Treatment Plan ASSESSMENTS - Wound and Skin Assessment / Reassessment X - Simple Wound Assessment / Reassessment - one wound 1 5 []  - 0 Complex Wound Assessment / Reassessment - multiple wounds []  - 0 Dermatologic / Skin Assessment (not related to wound area) ASSESSMENTS - Focused Assessment []  - Circumferential Edema Measurements - multi extremities 0 []  - 0 Nutritional Assessment / Counseling / Intervention []  - 0 Lower Extremity Assessment (monofilament, tuning fork, pulses) []  - 0 Peripheral Arterial Disease Assessment (using hand held doppler) ASSESSMENTS - Ostomy and/or Continence Assessment and Care []  - Incontinence Assessment and Management 0 []  - 0 Ostomy Care Assessment and Management (repouching, etc.) PROCESS - Coordination of Care X - Simple Patient / Family Education for ongoing care 1 15 []  - 0 Complex (extensive) Patient / Family Education for ongoing care X- 1 10 Staff obtains Programmer, systems, Records, Test Results / Process Orders []  - 0 Staff telephones HHA, Nursing Homes / Clarify orders / etc []  - 0 Routine Transfer to another Facility (non-emergent condition) []  - 0 Routine Hospital Admission (non-emergent condition) []  - 0 New Admissions / Biomedical engineer / Ordering NPWT, Apligraf, etc. []  - 0 Emergency Hospital Admission (emergent condition) X- 1 10 Simple Discharge Coordination []  - 0 Complex (extensive) Discharge Coordination PROCESS - Special Needs []  - Pediatric / Minor Patient Management 0 []  - 0 Isolation Patient Management []  - 0 Hearing / Language / Visual special needs []  - 0 Assessment of Community assistance (transportation, D/C planning, etc.) []  - 0 Additional  assistance / Altered mentation []  - 0 Support Surface(s) Assessment (bed, cushion, seat, etc.) INTERVENTIONS - Wound Cleansing / Measurement [name], [name] ([id number]) X- 1 5 Simple Wound Cleansing - one  wound []  - 0 Complex Wound Cleansing - multiple wounds []  - 0 Wound Imaging (photographs - any number of wounds) []  - 0 Wound Tracing (instead of photographs) X- 1 5 Simple Wound Measurement - one wound []  - 0 Complex Wound Measurement - multiple wounds INTERVENTIONS - Wound Dressings []  - Small Wound Dressing one or multiple wounds 0 []  - 0 Medium Wound Dressing one or multiple wounds X- 1 20 Large Wound Dressing one or multiple wounds []  - 0 Application of Medications - topical []  - 0 Application of Medications - injection INTERVENTIONS - Miscellaneous []  - External ear exam 0 []  - 0 Specimen Collection (cultures, biopsies, blood, body fluids, etc.) []  - 0 Specimen(s) / Culture(s) sent or taken to Lab for analysis []  - 0 Patient Transfer (multiple staff / Civil Service fast streamer / Similar devices) []  - 0 Simple Staple / Suture removal (25 or less) []  - 0 Complex Staple / Suture removal (26 or more) []  - 0 Hypo / Hyperglycemic Management (close monitor of Blood Glucose) []  - 0 Ankle / Brachial Index (ABI) - do not check if billed separately X- 1 5 Vital Signs Has the patient been seen at the hospital within the last three years: Yes Total Score: 90 Level Of Care: New/Established - Level 3 Electronic Signature(s) Signed: 04/02/2020 12:58:54 PM By: Gretta Cool, BSN, RN, [name], Kim RN, BSN Entered By: Gretta Cool, BSN, RN, [name], Kim on 03/31/2020 14:22:58 [name] ([id number]) -------------------------------------------------------------------------------- Encounter Discharge Information Details [name]. Date of Service: 03/31/2020 1:45 PM Medical Record Number: [id number] Patient Account Number: [id number] Date of Birth/Sex: 27-Mar-1926 (86 y.o.  F) Treating RN: Cornell Barman Primary Care Montasia Chisenhall: Derinda Late Other Clinician: Referring Usbaldo Pannone: BABAOFF, MARCUS Treating Kashon Kraynak/Extender: Tito Dine in Treatment: 9 Encounter Discharge Information Items Discharge Condition: Stable Ambulatory Status: Ambulatory Discharge Destination: Home Transportation: Private Auto Accompanied By: self Schedule Follow-up Appointment: Yes Clinical Summary of Care: Electronic Signature(s) Signed: 04/02/2020 12:58:54 PM By: Gretta Cool, BSN, RN, [name], Kim RN, BSN Entered By: Gretta Cool, BSN, RN, [name], Kim on 03/31/2020 14:23:44 [name] ([id number]) -------------------------------------------------------------------------------- Lower Extremity Assessment Details [name], [name]. Date of Service: 03/31/2020 1:45 PM Medical Record Number: [id number] Patient Account Number: [id number] Date of Birth/Sex: 03-07-1926 (86 y.o. F) Treating RN: Montey Hora Primary Care Keyle Doby: BABAOFF, MARCUS Other Clinician: Referring Kyia Rhude: BABAOFF, MARCUS Treating Adelynne Joerger/Extender: Ricard Dillon Weeks in Treatment: 9 Edema Assessment Assessed: [Left: No] [Right: No] Edema: [Left: Ye] [Right: s] Calf Left: Right: Point of Measurement: 30 cm From Medial Instep cm 29 cm Ankle Left: Right: Point of Measurement: 10 cm From Medial Instep cm 21 cm Vascular Assessment Pulses: Dorsalis Pedis Palpable: [Right:Yes] Electronic Signature(s) Signed: 03/31/2020 4:13:29 PM By: Montey Hora Entered By: Montey Hora on 03/31/2020 14:02:21 [name], [name] ([id number]) -------------------------------------------------------------------------------- Multi Wound Chart Details [name]. Date of Service: 03/31/2020 1:45 PM Medical Record Number: [id number] Patient Account Number: [id number] Date of Birth/Sex: 1926-10-23 (86 y.o. F) Treating RN: Cornell Barman Primary Care Lecia Esperanza: BABAOFF, MARCUS Other  Clinician: Referring Everline Mahaffy: BABAOFF, MARCUS Treating Donovon Micheletti/Extender: Ricard Dillon Weeks in Treatment: 9 Vital Signs Height(in): 62 Pulse(bpm): 92 Weight(lbs): 23 Blood Pressure(mmHg): 150/70 Body Mass Index(BMI): 18 Temperature(F): 97.8 Respiratory Rate(breaths/min): 16 Photos: [N/A:N/A] Wound Location: Right, Lateral Lower Leg N/A N/A Wounding Event: Trauma N/A N/A Primary Etiology: Venous Leg Ulcer N/A N/A Secondary Etiology: Trauma, Other N/A N/A Comorbid History: Cataracts, Deep Vein Thrombosis, N/A N/A Hypertension, Osteoarthritis, Dementia Date Acquired: 01/20/2020  N/A N/A Weeks of Treatment: 9 N/A N/A Wound Status: Open N/A N/A Measurements L x W x D (cm) 1.6x1.5x0.1 N/A N/A Area (cm) : 1.885 N/A N/A Volume (cm) : 0.188 N/A N/A % Reduction in Area: 86.70% N/A N/A % Reduction in Volume: 93.30% N/A N/A Classification: Full Thickness Without Exposed N/A N/A Support Structures Exudate Amount: Medium N/A N/A Exudate Type: Serous N/A N/A Exudate Color: amber N/A N/A Wound Margin: Flat and Intact N/A N/A Granulation Amount: Large (67-100%) N/A N/A Granulation Quality: Pink N/A N/A Necrotic Amount: Small (1-33%) N/A N/A Exposed Structures: Fat Layer (Subcutaneous Tissue) N/A N/A Exposed: Yes Fascia: No Tendon: No Muscle: No Joint: No Bone: No Epithelialization: Small (1-33%) N/A N/A Treatment Notes Wound #3 (Right, Lateral Lower Leg) Notes hblue, ABD, kerlix, coban [name], [name] ([id number]) Electronic Signature(s) Signed: 03/31/2020 4:11:49 PM By: Linton Ham MD Entered By: Linton Ham on 03/31/2020 14:36:41 [name], [name] ([id number]) -------------------------------------------------------------------------------- Multi-Disciplinary Care Plan Details [name]. Date of Service: 03/31/2020 1:45 PM Medical Record Number: [id number] Patient Account Number: [id number] Date of Birth/Sex: 05/08/1926 (86 y.o.  F) Treating RN: Cornell Barman Primary Care Yarielis Funaro: Derinda Late Other Clinician: Referring Cassandr Cederberg: BABAOFF, MARCUS Treating Niylah Hassan/Extender: Tito Dine in Treatment: 9 Active Inactive Necrotic Tissue Nursing Diagnoses: Impaired tissue integrity related to necrotic/devitalized tissue Goals: Necrotic/devitalized tissue will be minimized in the wound bed Date Initiated: 01/28/2020 Target Resolution Date: 02/04/2020 Goal Status: Active Interventions: Assess patient pain level pre-, during and post procedure and prior to discharge Treatment Activities: Apply topical anesthetic as ordered : 01/28/2020 Notes: Orientation to the Wound Care Program Nursing Diagnoses: Knowledge deficit related to the wound healing center program Goals: Patient/caregiver will verbalize understanding of the [place] Date Initiated: 01/28/2020 Target Resolution Date: 02/04/2020 Goal Status: Active Interventions: Provide education on orientation to the wound center Notes: Soft Tissue Infection Nursing Diagnoses: Impaired tissue integrity Goals: Patient will remain free of wound infection Date Initiated: 01/28/2020 Target Resolution Date: 02/11/2020 Goal Status: Active Interventions: Assess signs and symptoms of infection every visit Notes: Wound/Skin Impairment Nursing Diagnoses: Impaired tissue integrity Goals: Ulcer/skin breakdown will have a volume reduction of 30% by week 4 [name], [name] ([id number]) Date Initiated: 01/28/2020 Target Resolution Date: 02/27/2020 Goal Status: Active Interventions: Assess ulceration(s) every visit Treatment Activities: Skin care regimen initiated : 01/28/2020 Topical wound management initiated : 01/28/2020 Notes: Electronic Signature(s) Signed: 04/02/2020 12:58:54 PM By: Gretta Cool, BSN, RN, [name], Kim RN, BSN Entered By: Gretta Cool, BSN, RN, [name], Kim on 03/31/2020 14:20:07 [name]  ([id number]) -------------------------------------------------------------------------------- Pain Assessment Details [name]. Date of Service: 03/31/2020 1:45 PM Medical Record Number: [id number] Patient Account Number: [id number] Date of Birth/Sex: 14-Apr-1926 (86 y.o. F) Treating RN: Montey Hora Primary Care Ananth Fiallos: Derinda Late Other Clinician: Referring Leonie Amacher: BABAOFF, MARCUS Treating Brisia Schuermann/Extender: Ricard Dillon Weeks in Treatment: 9 Active Problems Location of Pain Severity and Description of Pain Patient Has Paino Yes Site Locations Pain Location: Pain in Ulcers With Dressing Change: Yes Duration of the Pain. Constant / Intermittento Constant Pain Management and Medication Current Pain Management: Electronic Signature(s) Signed: 03/31/2020 4:13:29 PM By: Montey Hora Entered By: Montey Hora on 03/31/2020 13:59:41 [name], [name] ([id number]) -------------------------------------------------------------------------------- Wound Assessment Details Patient Name: Brittany Lolling C. Date of Service: 03/31/2020 1:45 PM Medical Record Number: [id number] Patient Account Number: [id number] Date of Birth/Sex: 01/14/1926 (86 y.o. F) Treating RN: Montey Hora Primary Care Caileigh Canche: Derinda Late Other Clinician: Referring Jaye Polidori: BABAOFF, MARCUS Treating  Silvie Obremski/Extender: Ricard Dillon Weeks in Treatment: 9 Wound Status Wound Number: 3 Primary Venous Leg Ulcer Etiology: Wound Location: Right, Lateral Lower Leg Secondary Trauma, Other Wounding Event: Trauma Etiology: Date Acquired: 01/20/2020 Wound Status: Open Weeks Of Treatment: 9 Comorbid Cataracts, Deep Vein Thrombosis, Hypertension, Clustered Wound: No History: Osteoarthritis, Dementia Photos Wound Measurements Length: (cm) 1.6 Width: (cm) 1.5 Depth: (cm) 0.1 Area: (cm) 1.885 Volume: (cm) 0.188 % Reduction in Area: 86.7% % Reduction in Volume:  93.3% Epithelialization: Small (1-33%) Tunneling: No Undermining: No Wound Description Classification: Full Thickness Without Exposed Support Structu Wound Margin: Flat and Intact Exudate Amount: Medium Exudate Type: Serous Exudate Color: amber res Foul Odor After Cleansing: No Slough/Fibrino Yes Wound Bed Granulation Amount: Large (67-100%) Exposed Structure Granulation Quality: Pink Fascia Exposed: No Necrotic Amount: Small (1-33%) Fat Layer (Subcutaneous Tissue) Exposed: Yes Necrotic Quality: Adherent Slough Tendon Exposed: No Muscle Exposed: No Joint Exposed: No Bone Exposed: No Treatment Notes Wound #3 (Right, Lateral Lower Leg) Notes hblue, ABD, kerlix, coban Electronic Signature(s) [name], [name] ([id number]) Signed: 03/31/2020 4:13:29 PM By: Montey Hora Entered By: Montey Hora on 03/31/2020 14:05:55 [name], [name] ([id number]) -------------------------------------------------------------------------------- Vitals Details [name]. Date of Service: 03/31/2020 1:45 PM Medical Record Number: [id number] Patient Account Number: [id number] Date of Birth/Sex: [birth year] (86 y.o. F) Treating RN: Cornell Barman Primary Care Kla Bily: BABAOFF, MARCUS Other Clinician: Referring Kobie Matkins: BABAOFF, MARCUS Treating Natanya Holecek/Extender: Ricard Dillon Weeks in Treatment: 9 Vital Signs Time Taken: 13:45 Temperature (F): 97.8 Height (in): 62 Pulse (bpm): 92 Weight (lbs): 98 Respiratory Rate (breaths/min): 16 Body Mass Index (BMI): 17.9 Blood Pressure (mmHg): 150/70 Reference Range: 80 - 120 mg / dl Electronic Signature(s) Signed: 04/01/2020 11:39:41 AM By: Lorine Bears RCP, RRT, CHT Entered By: Lorine Bears on 03/31/2020 13:47:45

## 2020-04-05 LAB — ACID FAST CULTURE WITH REFLEXED SENSITIVITIES (MYCOBACTERIA): Acid Fast Culture: NEGATIVE

## 2020-04-07 ENCOUNTER — Encounter: Payer: Medicare Other | Admitting: Internal Medicine

## 2020-04-07 ENCOUNTER — Other Ambulatory Visit: Payer: Self-pay

## 2020-04-07 DIAGNOSIS — L97812 Non-pressure chronic ulcer of other part of right lower leg with fat layer exposed: Secondary | ICD-10-CM | POA: Diagnosis not present

## 2020-04-07 NOTE — Progress Notes (Signed)
YICEL, SHANNON ([id number]) Visit Report for 04/07/2020 HPI Details [name], [name]. Date of Service: 04/07/2020 1:30 PM Medical Record Number: [id number] Patient Account Number: [id number] Date of Birth/Sex: [birth year] (86 y.o. F) Treating RN: Cornell Barman Primary Care Provider: Derinda Late Other Clinician: Referring Provider: BABAOFF, MARCUS Treating Provider/Extender: Tito Dine in Treatment: 10 History of Present Illness HPI Description: 06/06/17 on evaluation today patient presents for evaluation concerning an ulcer which she initially had arise as a result of striking her left lower extremity money bed frame. With that being said this was roughly one month ago and unfortunately the wound despite two courses of Keflex have not really made a dramatic improvement. This has continued to drain as well as being exquisitely tender at times. Even to the point that she is having a difficult time walking. Patient does have chronic atrial fibrillation which subsequently leads to her being on long-term anticoagulant therapy and this causes ED bruising which may have contributed to this entry and where things are at this point. She does have valvular heart disease as well as hypertension and her last INR was 2.7. Currently Neosporin, Gauls, and an ace wrap has been utilized. Keflex is the antibiotic that she has been on. Patient has not had a culture up to this point and she states that her blood pressure at the primary care office yesterday was 138/72. Her pain is significant related to be an eight out of 10. No fevers, chills, nausea, or vomiting noted at this time. 06/20/17; patient was admitted here 2 weeks ago. She had a traumatic wound on her left lateral lower leg probably in the setting of some degree of venous insufficiency with surrounding cellulitis. She had been on Keflex, after we saw her she was put on doxycycline which she apparently did not tolerate.  Culture that was done at the time showed Morganella which was resistant to Keflex. Apparently the patient continued to take Keflex after the appointment. She is also on Coumadin. I had tried to change her antibiotic when the Morganella culture was brought to my attention however apparently our staff could not reach the patient to inform them of the antibiotic change 06/26/17; patient has wound on her left lateral lower extremity in the setting of some degree of venous insufficiency. I gave her cefdinir last week and she is completing this. There is no evidence of surrounding infection. Aggressive debridement last week, wound bed looks healthier. We've been using Aquacel. They're traveling in the [place] next week we'll see her back in 2 weeks unless there are problems. 07/10/17; the patient is been using Aquacel Ag her husband is changing this with Kerlix and conform. She still complains of a lot of pain. Apparently with the antibiotics. We gave 2 or 3 weeks ago. Her INR went up to 6, this was managed by her primary physician 07/17/17; she is using Aquacel Ag and a border foam. Still discomfort although dimensions are better. 07/24/17; patient or for review of a trauma wound on her left anterior leg in the setting of some degree of chronic venous insufficiency she has been using Aquacel Ag and a border foam and making progress. 07/31/17; only a small open area of this original significant trauma is still open. Her husband is changing this every second day [Aquacel Ag] Readmission: 01/14/19 on evaluation today patient is that she seen for initial inspection during the office visit today concerning a trauma/skin tear to the left anterior lower extremity. This is roughly  the same region where she was previously treated and years past when she was here in the clinic. Nonetheless upon evaluation today the patient's wound actually showed signs of decent granulation around the edge although unfortunately  the skin flap that was torn back did fold under on itself and the flap itself is dying. She does have some eschar surrounded the edges of the wound at this point. Nonetheless this also was very tender for her. The patient does have a history of hypertension, atrial fibrillation, and long-term use anticoagulant therapy. 01/21/19 on evaluation today patient's wound on the anterior portion of her lower extremity appears to still be necrotic as far as the surface of the wound is concerned. Unfortunately there is no significant improvement at this point compared to last week they were not able to get the Santyl that are using Medihoney and maybe one reason why. The other is I think this may be stained to drive. Possibly adding something such as mepitel over top of the Medihoney may help to loosen this up quite a bit which I think would be in her best interest. Also she still has a lot of erythema and is very tender to touch I'm afraid that we may need to switch to a different antibiotic to see if this will be of benefit there still really nothing that I can culture to get a better picture of what's going on and what we need to do from the standpoint of antibiotics. I do believe the patient needs a referral Talmuds me to vascular in order to evaluate her blood flow based on what I'm seeing they will be able to perform TBI testing if nothing else along with the vascular evaluation to see if there's any evidence for limited blood flow to this lower extremity. 4/8; I have reviewed the patient's arterial studies and they are really quite good. There should be no arterial impediments to healing the wound on the right anterior tibial area. 4/15; patient has a small open area over the left mid tibial area. Still requiring debridement we are using Santyl here and Medihoney at home [Santyl unaffordable] 4/22; difficult, presumably traumatic area over the left mid tibial area in the setting of chronic venous  insufficiency. We have been using Santyl here and meta honey at home. The wound surface is getting gradually better and slight improvement in dimensions. Her husband is changing this daily 4/29; left mid tibia presumably traumatic wound in the setting of chronic venous insufficiency. We have been using Santyl and a combination of Medihoney at home. Arrives today with a much better looking healthy granulated surface. We will change the primary dressing to [facility] 5/6; left mid tibia smaller reasonably healthy looking wound using Hydrofera Blue since last week 5/20-Patient returns to clinic with a left mid tibial wound looking better, we have been using Hydrofera Blue since the previous week 5/27; patient returns to clinic with a left mid tibial wound covered in raised eschar. We have been using Hydrofera Blue her husband changes the dressings using a border foam cover READMISSION 01/28/20 [name] is a patient who is now 86 years old. As usual she comes in with her husband who is her primary caregiver. They were here last from March through May 2020 with a traumatic wound on the left anterior tibia we eventually heal this out. She has chronic venous insufficiency. She is also here previously in 2018. [name], [name] ([id number]) Her current problem started 8 days ago. She went in  the hospital for a thoracic level kyphoplasty. The procedure she developed a skin tear on her leg. This was mentioned to her. Her husband is applying Medihoney to the area and they are in for our review of this. She finds this much too uncomfortable to attempt ABIs although her ABIs when she was in the clinic last year were quite normal bilaterally Past medical history she is not a diabetic. As noted she had a T9 and T6 kyphoplasty, Alzheimer's disease, chronic atrial fibrillation on Coumadin and hypertension 4/14; patient arrives in clinic having removed part of the wrap. We attempted to leave this on all  week. Unfortunately none that none of the tissue that looks semiviable last week has remained viable. She required an extensive debridement. We have been using silver alginate 4/21; the patient arrived today with tremendous erythema around the large part of the wound on the right lateral leg. This extends medially and down towards her foot. I marked the area but I think the extent of the likely cellulitis is beyond what can be safely managed as an outpatient. 02/23/2020 upon evaluation today patient is seen today though she is typically followed by Dr. Dellia Nims. Last time he saw her he sent her to the hospital and she subsequently was in the hospital for some time. In fact it was from 02/11/2020 through 02/19/2020. During that time she was on antibiotic therapy by way of IV antibiotics. Her leg does appear to be doing better today compared to what we saw then. Nonetheless she had a culture that was obtained while she was in the hospital by Dr. Steva Ready. This ended up not growing anything which is not really surprising considering she was on antibiotics which were fairly strong at the time of culture. There was no growth at all after 2 days. With that being said Dr. Steva Ready apparently discussed with Dr. Dellia Nims per her note anyway that she wanted the patient off of the antibiotics for a week and then to repeat a culture in the outpatient setting. She also has a suspicion for the possibility of pyoderma and has referred the patient to Dr. Nehemiah Massed and the patient is supposed to be seeing him sometime I believe Thursday of this week. Nonetheless I explained that pyoderma can sometimes begin as a injury or insult that then worsens. Obviously the initial injury here appears to have been a scraping of her leg that occurred when the patient was being transferred off of the table after a kyphoplasty procedure. Fortunately again as mentioned the infection seems to be doing significantly better based on what I am  seeing at this point compared to previous pictures that I reviewed today. 5/5; the patient is back to my clinic today after seeing with stone 2 days ago. She is on [employer] with a Kerlix Coban I have reviewed her recent history. We had sent her to the hospital with presumed cellulitis considerable erythema in the lateral leg. She received prolonged courses of antibiotics in the hospital. I received a call from one of the hospitalist to report that he didn't feel that she was responding to antibiotics although as noted 2 days ago by Jeri Cos the wound actually looks a lot better along with the periwound skin which is not nearly as erythematous. He raised the possibility of pyoderma gangrenosum which also had been brought up by Dr. Steva Ready the infectious disease consultant. The discharge culture was negative but she was still on antibiotics The pyoderma gangrenosum apparently has something to do  with the fact that both her sons have Crohn's disease although the patient does not have Crohn's disease. I told the doctor that call me I didn't really think this fit with the history which was a trauma induced wound why the patient was having a kyphoplasty. In fact her husband is still very angry about this with the hospital. She also has severe chronic venous insufficiency with hemosiderin and stasis dermatitis and has had wounds in both legs. 5/12; venous insufficiency wound on the right lower lateral lower leg initially trauma. Still debris on the surface of this wound that undergoes a difficult debridement. We have been using Hydrofera Blue 5/19; venous insufficiency wound on the right lower lateral leg initially trauma. Better looking wound surface and a rim of epithelialization. Surface area is smaller we have been using Hydrofera Blue under compression 03/17/20-Patient returns at 1 week, the wound is looking stable, dimensions are slightly smaller, continue Hydrofera Blue and Santyl  under compression 6/2; venous insufficiency wound on the right lower leg initially traumatic. She has a considerably better looking wound surface than when I saw this 2 weeks ago. Some hyper granulation. We have been using Santyl with [facility] cover. I no longer think the Santyl is necessary we will just use Hydrofera Blue kerlix Coban compression They are going to be traveling including to the [facility] and then subsequently a family vacation in [place]. Her daughter-in-law stated that they would be able to get her in here in 2 weeks before they go to [place] I think that would be good plan. We will show her how to do the dressing including the Kerlix Coban 6/9; venous insufficiency wound on the right lower leg initially traumatic. Complicated by infection requiring hospitalization. Since then she has done very nicely and the wound has been getting progressively smaller we have been using Hydrofera Blue under Kerlix and Coban 6/16; venous insufficiency wound on the right lower leg initially traumatic. This was complicated by an infection with persistent cellulitis requiring a fairly protracted hospitalization. We are using Hydrofera Blue under Kerlix and Coban. The wound is contracting nicely. Electronic Signature(s) Signed: 04/07/2020 4:26:25 PM By: Linton Ham MD Entered By: Linton Ham on 04/07/2020 14:27:01 [name] ([id number]) -------------------------------------------------------------------------------- Physical Exam Details Patient Name: MARIYAM, REMINGTON. Date of Service: 04/07/2020 1:30 PM Medical Record Number: [id number] Patient Account Number: [id number] Date of Birth/Sex: 1925-12-05 (86 y.o. F) Treating RN: Cornell Barman Primary Care Provider: Derinda Late Other Clinician: Referring Provider: BABAOFF, MARCUS Treating Provider/Extender: Ricard Dillon Weeks in Treatment: 10 Constitutional Sitting or standing Blood Pressure is within  target range for patient.. Pulse regular and within target range for patient.Marland Kitchen Respirations regular, non- labored and within target range.. Temperature is normal and within the target range for the patient.Marland Kitchen appears in no distress. Respiratory Respiratory effort is easy and symmetric bilaterally. Rate is normal at rest and on room air.. Cardiovascular Pedal pulses are palpable. Integumentary (Hair, Skin) No surrounding erythema around the wound. Psychiatric Baseline dementia. Notes Wound exam; right anterior lower leg. Healthy looking granulated surface. The wound measurements are smaller nice improvement in surface area. Patient has chronic venous insufficiency edema is under control with Kerlix Coban wraps Electronic Signature(s) Signed: 04/07/2020 4:26:25 PM By: Linton Ham MD Entered By: Linton Ham on 04/07/2020 14:31:38 [name], [name] ([id number]) -------------------------------------------------------------------------------- Physician Orders Details [name]. Date of Service: 04/07/2020 1:30 PM Medical Record Number: [id number] Patient Account Number: [id number] Date of Birth/Sex: [birth year] (86 y.o.  F) Treating RN: Cornell Barman Primary Care Provider: BABAOFF, MARCUS Other Clinician: Referring Provider: BABAOFF, MARCUS Treating Provider/Extender: Tito Dine in Treatment: 10 Verbal / Phone Orders: No Diagnosis Coding Wound Cleansing Wound #3 Right,Lateral Lower Leg o Cleanse wound with mild soap and water Anesthetic (add to Medication List) Wound #3 Right,Lateral Lower Leg o Topical Lidocaine 4% cream applied to wound bed prior to debridement (In Clinic Only). Primary Wound Dressing o Hydrafera Blue Ready Transfer Secondary Dressing Wound #3 Right,Lateral Lower Leg o ABD pad o Kerlix and Coban Dressing Change Frequency Wound #3 Right,Lateral Lower Leg o Change Dressing Monday, Wednesday, Friday Follow-up  Appointments Wound #3 Right,Lateral Lower Leg o Return Appointment in 2 weeks. Edema Control Wound #3 Right,Lateral Lower Leg o Kerlix and Coban - Right Lower Extremity o Elevate legs to the level of the heart and pump ankles as often as possible Additional Orders / Instructions Wound #3 Right,Lateral Lower Leg o Increase protein intake. Home Health Wound #3 [place] Visits - Monday and Friday (Wednesdays in clinic) [facility] Nurse may visit PRN to address patientos wound care needs. o FACE TO FACE ENCOUNTER: MEDICARE and MEDICAID PATIENTS: I certify that this patient is under my care and that I had a face-to-face encounter that meets the physician face-to-face encounter requirements with this patient on this date. The encounter with the patient was in whole or in part for the following MEDICAL CONDITION: (primary reason for [place]) MEDICAL NECESSITY: I certify, that based on my findings, NURSING services are a medically necessary home health service. HOME BOUND STATUS: I certify that my clinical findings support that this patient is homebound (i.e., Due to illness or injury, pt requires aid of supportive devices such as crutches, cane, wheelchairs, walkers, the use of special transportation or the assistance of another person to leave their place of residence. There is a normal inability to leave the home and doing so requires considerable and taxing effort. Other absences are for medical reasons / religious services and are infrequent or of short duration when for other reasons). o If current dressing causes regression in wound condition, may D/C ordered dressing product/s and apply Normal Saline Moist Dressing daily until next [place] / Other MD appointment. [place] of regression in wound condition at [phone]. o Please direct any NON-WOUND related issues/requests for orders to  patient's Primary Care Physician [name], [name] ([id number]) Electronic Signature(s) Signed: 04/07/2020 4:26:25 PM By: Linton Ham MD Signed: 04/07/2020 5:04:05 PM By: Gretta Cool, BSN, RN, [name], Kim RN, BSN Entered By: Gretta Cool, BSN, RN, [name], Kim on 04/07/2020 14:14:19 [name] ([id number]) -------------------------------------------------------------------------------- Problem List Details [name], [name]. Date of Service: 04/07/2020 1:30 PM Medical Record Number: [id number] Patient Account Number: [id number] Date of Birth/Sex: [birth year] (86 y.o. F) Treating RN: Cornell Barman Primary Care Provider: Derinda Late Other Clinician: Referring Provider: BABAOFF, MARCUS Treating Provider/Extender: Tito Dine in Treatment: 10 Active Problems ICD-10 Encounter Code Description Active Date MDM Diagnosis S81.811D Laceration without foreign body, right lower leg, subsequent encounter 01/28/2020 No Yes L97.812 Non-pressure chronic ulcer of other part of right lower leg with fat layer 01/28/2020 No Yes exposed I87.321 Chronic venous hypertension (idiopathic) with inflammation of right 01/28/2020 No Yes lower extremity Inactive Problems Resolved Problems Electronic Signature(s) Signed: 04/07/2020 4:26:25 PM By: Linton Ham MD Entered By: Linton Ham on 04/07/2020 14:24:27 [name] ([id number]) -------------------------------------------------------------------------------- Progress Note Details Patient Name: Brittany Lolling  C. Date of Service: 04/07/2020 1:30 PM Medical Record Number: [id number] Patient Account Number: [id number] Date of Birth/Sex: [birth year] (86 y.o. F) Treating RN: Cornell Barman Primary Care Provider: Derinda Late Other Clinician: Referring Provider: BABAOFF, MARCUS Treating Provider/Extender: Ricard Dillon Weeks in Treatment: 10 Subjective History of Present Illness (HPI) 06/06/17 on evaluation today patient presents for  evaluation concerning an ulcer which she initially had arise as a result of striking her left lower extremity money bed frame. With that being said this was roughly one month ago and unfortunately the wound despite two courses of Keflex have not really made a dramatic improvement. This has continued to drain as well as being exquisitely tender at times. Even to the point that she is having a difficult time walking. Patient does have chronic atrial fibrillation which subsequently leads to her being on long-term anticoagulant therapy and this causes ED bruising which may have contributed to this entry and where things are at this point. She does have valvular heart disease as well as hypertension and her last INR was 2.7. Currently Neosporin, Gauls, and an ace wrap has been utilized. Keflex is the antibiotic that she has been on. Patient has not had a culture up to this point and she states that her blood pressure at the primary care office yesterday was 138/72. Her pain is significant related to be an eight out of 10. No fevers, chills, nausea, or vomiting noted at this time. 06/20/17; patient was admitted here 2 weeks ago. She had a traumatic wound on her left lateral lower leg probably in the setting of some degree of venous insufficiency with surrounding cellulitis. She had been on Keflex, after we saw her she was put on doxycycline which she apparently did not tolerate. Culture that was done at the time showed Morganella which was resistant to Keflex. Apparently the patient continued to take Keflex after the appointment. She is also on Coumadin. I had tried to change her antibiotic when the Morganella culture was brought to my attention however apparently our staff could not reach the patient to inform them of the antibiotic change 06/26/17; patient has wound on her left lateral lower extremity in the setting of some degree of venous insufficiency. I gave her cefdinir last week and she is completing  this. There is no evidence of surrounding infection. Aggressive debridement last week, wound bed looks healthier. We've been using Aquacel. They're traveling in the [place] next week we'll see her back in 2 weeks unless there are problems. 07/10/17; the patient is been using Aquacel Ag her husband is changing this with Kerlix and conform. She still complains of a lot of pain. Apparently with the antibiotics. We gave 2 or 3 weeks ago. Her INR went up to 6, this was managed by her primary physician 07/17/17; she is using Aquacel Ag and a border foam. Still discomfort although dimensions are better. 07/24/17; patient or for review of a trauma wound on her left anterior leg in the setting of some degree of chronic venous insufficiency she has been using Aquacel Ag and a border foam and making progress. 07/31/17; only a small open area of this original significant trauma is still open. Her husband is changing this every second day [Aquacel Ag] Readmission: 01/14/19 on evaluation today patient is that she seen for initial inspection during the office visit today concerning a trauma/skin tear to the left anterior lower extremity. This is roughly the same region where she was previously treated and years past when she  was here in the clinic. Nonetheless upon evaluation today the patient's wound actually showed signs of decent granulation around the edge although unfortunately the skin flap that was torn back did fold under on itself and the flap itself is dying. She does have some eschar surrounded the edges of the wound at this point. Nonetheless this also was very tender for her. The patient does have a history of hypertension, atrial fibrillation, and long-term use anticoagulant therapy. 01/21/19 on evaluation today patient's wound on the anterior portion of her lower extremity appears to still be necrotic as far as the surface of the wound is concerned. Unfortunately there is no significant improvement at  this point compared to last week they were not able to get the Santyl that are using Medihoney and maybe one reason why. The other is I think this may be stained to drive. Possibly adding something such as mepitel over top of the Medihoney may help to loosen this up quite a bit which I think would be in her best interest. Also she still has a lot of erythema and is very tender to touch I'm afraid that we may need to switch to a different antibiotic to see if this will be of benefit there still really nothing that I can culture to get a better picture of what's going on and what we need to do from the standpoint of antibiotics. I do believe the patient needs a referral Talmuds me to vascular in order to evaluate her blood flow based on what I'm seeing they will be able to perform TBI testing if nothing else along with the vascular evaluation to see if there's any evidence for limited blood flow to this lower extremity. 4/8; I have reviewed the patient's arterial studies and they are really quite good. There should be no arterial impediments to healing the wound on the right anterior tibial area. 4/15; patient has a small open area over the left mid tibial area. Still requiring debridement we are using Santyl here and Medihoney at home [Santyl unaffordable] 4/22; difficult, presumably traumatic area over the left mid tibial area in the setting of chronic venous insufficiency. We have been using Santyl here and meta honey at home. The wound surface is getting gradually better and slight improvement in dimensions. Her husband is changing this daily 4/29; left mid tibia presumably traumatic wound in the setting of chronic venous insufficiency. We have been using Santyl and a combination of Medihoney at home. Arrives today with a much better looking healthy granulated surface. We will change the primary dressing to [facility] 5/6; left mid tibia smaller reasonably healthy looking wound using Hydrofera  Blue since last week 5/20-Patient returns to clinic with a left mid tibial wound looking better, we have been using Hydrofera Blue since the previous week 5/27; patient returns to clinic with a left mid tibial wound covered in raised eschar. We have been using Hydrofera Blue her husband changes the dressings using a border foam cover READMISSION 01/28/20 [name] is a patient who is now 86 years old. As usual she comes in with her husband who is her primary caregiver. They were here last from March through May 2020 with a traumatic wound on the left anterior tibia we eventually heal this out. She has chronic venous insufficiency. She is also here previously in 2018. Her current problem started 8 days ago. She went in the hospital for a thoracic level kyphoplasty. The procedure she developed a skin tear on her leg.  This was mentioned to her. Her husband is applying Medihoney to the area and they are in for our review of this. She finds this much too uncomfortable to attempt ABIs although her ABIs when she was in the clinic last year were quite normal bilaterally [name], [name] ([id number]) Past medical history she is not a diabetic. As noted she had a T9 and T6 kyphoplasty, Alzheimer's disease, chronic atrial fibrillation on Coumadin and hypertension 4/14; patient arrives in clinic having removed part of the wrap. We attempted to leave this on all week. Unfortunately none that none of the tissue that looks semiviable last week has remained viable. She required an extensive debridement. We have been using silver alginate 4/21; the patient arrived today with tremendous erythema around the large part of the wound on the right lateral leg. This extends medially and down towards her foot. I marked the area but I think the extent of the likely cellulitis is beyond what can be safely managed as an outpatient. 02/23/2020 upon evaluation today patient is seen today though she is typically followed by Dr.  Dellia Nims. Last time he saw her he sent her to the hospital and she subsequently was in the hospital for some time. In fact it was from 02/11/2020 through 02/19/2020. During that time she was on antibiotic therapy by way of IV antibiotics. Her leg does appear to be doing better today compared to what we saw then. Nonetheless she had a culture that was obtained while she was in the hospital by Dr. Steva Ready. This ended up not growing anything which is not really surprising considering she was on antibiotics which were fairly strong at the time of culture. There was no growth at all after 2 days. With that being said Dr. Steva Ready apparently discussed with Dr. Dellia Nims per her note anyway that she wanted the patient off of the antibiotics for a week and then to repeat a culture in the outpatient setting. She also has a suspicion for the possibility of pyoderma and has referred the patient to Dr. Nehemiah Massed and the patient is supposed to be seeing him sometime I believe Thursday of this week. Nonetheless I explained that pyoderma can sometimes begin as a injury or insult that then worsens. Obviously the initial injury here appears to have been a scraping of her leg that occurred when the patient was being transferred off of the table after a kyphoplasty procedure. Fortunately again as mentioned the infection seems to be doing significantly better based on what I am seeing at this point compared to previous pictures that I reviewed today. 5/5; the patient is back to my clinic today after seeing with stone 2 days ago. She is on [employer] with a Kerlix Coban I have reviewed her recent history. We had sent her to the hospital with presumed cellulitis considerable erythema in the lateral leg. She received prolonged courses of antibiotics in the hospital. I received a call from one of the hospitalist to report that he didn't feel that she was responding to antibiotics although as noted 2 days ago by Jeri Cos  the wound actually looks a lot better along with the periwound skin which is not nearly as erythematous. He raised the possibility of pyoderma gangrenosum which also had been brought up by Dr. Steva Ready the infectious disease consultant. The discharge culture was negative but she was still on antibiotics The pyoderma gangrenosum apparently has something to do with the fact that both her sons have Crohn's disease although the patient  does not have Crohn's disease. I told the doctor that call me I didn't really think this fit with the history which was a trauma induced wound why the patient was having a kyphoplasty. In fact her husband is still very angry about this with the hospital. She also has severe chronic venous insufficiency with hemosiderin and stasis dermatitis and has had wounds in both legs. 5/12; venous insufficiency wound on the right lower lateral lower leg initially trauma. Still debris on the surface of this wound that undergoes a difficult debridement. We have been using Hydrofera Blue 5/19; venous insufficiency wound on the right lower lateral leg initially trauma. Better looking wound surface and a rim of epithelialization. Surface area is smaller we have been using Hydrofera Blue under compression 03/17/20-Patient returns at 1 week, the wound is looking stable, dimensions are slightly smaller, continue Hydrofera Blue and Santyl under compression 6/2; venous insufficiency wound on the right lower leg initially traumatic. She has a considerably better looking wound surface than when I saw this 2 weeks ago. Some hyper granulation. We have been using Santyl with [facility] cover. I no longer think the Santyl is necessary we will just use Hydrofera Blue kerlix Coban compression They are going to be traveling including to the [facility] and then subsequently a family vacation in [place]. Her daughter-in-law stated that they would be able to get her in here in 2 weeks  before they go to [place] I think that would be good plan. We will show her how to do the dressing including the Kerlix Coban 6/9; venous insufficiency wound on the right lower leg initially traumatic. Complicated by infection requiring hospitalization. Since then she has done very nicely and the wound has been getting progressively smaller we have been using Hydrofera Blue under Kerlix and Coban 6/16; venous insufficiency wound on the right lower leg initially traumatic. This was complicated by an infection with persistent cellulitis requiring a fairly protracted hospitalization. We are using Hydrofera Blue under Kerlix and Coban. The wound is contracting nicely. Objective Constitutional Sitting or standing Blood Pressure is within target range for patient.. Pulse regular and within target range for patient.Marland Kitchen Respirations regular, non- labored and within target range.. Temperature is normal and within the target range for the patient.Marland Kitchen appears in no distress. Vitals Time Taken: 1:30 PM, Height: 62 in, Weight: 98 lbs, BMI: 17.9, Temperature: 97.9 F, Pulse: 82 bpm, Respiratory Rate: 16 breaths/min, Blood Pressure: 116/71 mmHg. Respiratory Respiratory effort is easy and symmetric bilaterally. Rate is normal at rest and on room air.. Cardiovascular Pedal pulses are palpable. Psychiatric Baseline dementia. General Notes: Wound exam; right anterior lower leg. Healthy looking granulated surface. The wound measurements are smaller nice [name], [name] ([id number]) improvement in surface area. Patient has chronic venous insufficiency edema is under control with Kerlix Coban wraps Integumentary (Hair, Skin) No surrounding erythema around the wound. Wound #3 status is Open. Original cause of wound was Trauma. The wound is located on the Right,Lateral Lower Leg. The wound measures 0.8cm length x 0.5cm width x 0.1cm depth; 0.314cm^2 area and 0.031cm^3 volume. There is Fat Layer (Subcutaneous Tissue)  Exposed exposed. There is no tunneling or undermining noted. There is a medium amount of serous drainage noted. The wound margin is flat and intact. There is large (67-100%) pink granulation within the wound bed. There is a small (1-33%) amount of necrotic tissue within the wound bed including Adherent Slough. Assessment Active Problems ICD-10 Laceration without foreign body, right lower leg, subsequent  encounter Non-pressure chronic ulcer of other part of right lower leg with fat layer exposed Chronic venous hypertension (idiopathic) with inflammation of right lower extremity Plan Wound Cleansing: Wound #3 Right,Lateral Lower Leg: Cleanse wound with mild soap and water Anesthetic (add to Medication List): Wound #3 Right,Lateral Lower Leg: Topical Lidocaine 4% cream applied to wound bed prior to debridement (In Clinic Only). Primary Wound Dressing: Hydrafera Blue Ready Transfer Secondary Dressing: Wound #3 Right,Lateral Lower Leg: ABD pad Kerlix and Coban Dressing Change Frequency: Wound #3 Right,Lateral Lower Leg: Change Dressing Monday, Wednesday, Friday Follow-up Appointments: Wound #3 Right,Lateral Lower Leg: Return Appointment in 2 weeks. Edema Control: Wound #3 Right,Lateral Lower Leg: Kerlix and Coban - Right Lower Extremity Elevate legs to the level of the heart and pump ankles as often as possible Additional Orders / Instructions: Wound #3 Right,Lateral Lower Leg: Increase protein intake. Home Health: Wound #3 Right,Lateral Lower Leg: Continue Home Health Visits - Monday and Friday (Wednesdays in clinic) [place] Nurse may visit PRN to address patient s wound care needs. FACE TO FACE ENCOUNTER: MEDICARE and MEDICAID PATIENTS: I certify that this patient is under my care and that I had a face-to-face encounter that meets the physician face-to-face encounter requirements with this patient on this date. The encounter with the patient was in whole or in part for the  following MEDICAL CONDITION: (primary reason for [place]) MEDICAL NECESSITY: I certify, that based on my findings, NURSING services are a medically necessary home health service. HOME BOUND STATUS: I certify that my clinical findings support that this patient is homebound (i.e., Due to illness or injury, pt requires aid of supportive devices such as crutches, cane, wheelchairs, walkers, the use of special transportation or the assistance of another person to leave their place of residence. There is a normal inability to leave the home and doing so requires considerable and taxing effort. Other absences are for medical reasons / religious services and are infrequent or of short duration when for other reasons). If current dressing causes regression in wound condition, may D/C ordered dressing product/s and apply Normal Saline Moist Dressing daily until next [place] / Other MD appointment. [place] of regression in wound condition at [phone]. Please direct any NON-WOUND related issues/requests for orders to patient's Primary Care Physician 1. Continue Hydrofera Blue under kerlix Coban 2. The patient is traveling to [place] next week on a family vacation and will not be back here for 2 weeks. They have supplies to change her dressing once. May be healed by the next time she is here [name], [name] ([id number]) 3. I reminded her husband that she is going to need 20/30 below-knee stockings. She has had several admissions this clinic usually traumatic wounds in the setting of damaged skin from chronic venous insufficiency Electronic Signature(s) Signed: 04/07/2020 4:26:25 PM By: Linton Ham MD Entered By: Linton Ham on 04/07/2020 14:32:59 [name], [name] ([id number]) -------------------------------------------------------------------------------- SuperBill Details [name]. Date of Service: 04/07/2020 Medical Record  Number: [id number] Patient Account Number: [id number] Date of Birth/Sex: 05-01-1926 (86 y.o. F) Treating RN: Cornell Barman Primary Care Provider: Derinda Late Other Clinician: Referring Provider: BABAOFF, MARCUS Treating Provider/Extender: Ricard Dillon Weeks in Treatment: 10 Diagnosis Coding ICD-10 Codes Code Description S81.811D Laceration without foreign body, right lower leg, subsequent encounter L97.812 Non-pressure chronic ulcer of other part of right lower leg with fat layer exposed I87.321 Chronic venous hypertension (idiopathic) with inflammation of right lower extremity Facility Procedures CPT4  Code: 73225672 Description: 09198 - WOUND CARE VISIT-LEV 3 EST PT Modifier: Quantity: 1 Physician Procedures CPT4 Code: [id number] Description: 10254 - WC PHYS LEVEL 3 - EST PT Modifier: Quantity: 1 CPT4 Code: Description: ICD-10 Diagnosis Description S81.811D Laceration without foreign body, right lower leg, subsequent encounter C62.824 Non-pressure chronic ulcer of other part of right lower leg with fat l I87.321 Chronic venous hypertension (idiopathic) with  inflammation of right lo Modifier: ayer exposed wer extremity Quantity: Electronic Signature(s) Signed: 04/07/2020 4:26:25 PM By: Linton Ham MD Entered By: Linton Ham on 04/07/2020 14:33:25

## 2020-04-07 NOTE — Progress Notes (Signed)
KATHY, WAHID ([id number]) Visit Report for 04/07/2020 Arrival Information Details [name], [name]. Date of Service: 04/07/2020 1:30 PM Medical Record Number: [id number] Patient Account Number: [id number] Date of Birth/Sex: 06-15-1926 (86 y.o. F) Treating RN: Cornell Barman Primary Care Chani Ghanem: Derinda Late Other Clinician: Referring Miela Desjardin: BABAOFF, MARCUS Treating Naftoli Penny/Extender: Tito Dine in Treatment: 10 Visit Information History Since Last Visit Added or deleted any medications: No Patient Arrived: Ambulatory Any new allergies or adverse reactions: No Arrival Time: 13:33 Had a fall or experienced change in No Accompanied By: husband activities of daily living that may affect Transfer Assistance: None risk of falls: Patient Identification Verified: Yes Signs or symptoms of abuse/neglect since last visito No Patient Has Alerts: Yes Hospitalized since last visit: No Patient Alerts: Patient on Blood Thinner Implantable device outside of the clinic excluding No warfarin cellular tissue based products placed in the center since last visit: Has Dressing in Place as Prescribed: Yes Pain Present Now: No Electronic Signature(s) Signed: 04/07/2020 4:31:33 PM By: Lorine Bears RCP, RRT, CHT Entered By: Lorine Bears on 04/07/2020 13:35:02 Ament, [name] ([id number]) -------------------------------------------------------------------------------- Clinic Level of Care Assessment Details Patient Name: [name]. Date of Service: 04/07/2020 1:30 PM Medical Record Number: [id number] Patient Account Number: [id number] Date of Birth/Sex: 1925-12-31 (86 y.o. F) Treating RN: Cornell Barman Primary Care Ayra Hodgdon: BABAOFF, MARCUS Other Clinician: Referring Andres Bantz: BABAOFF, MARCUS Treating Tierre Gerard/Extender: Tito Dine in Treatment: 10 Clinic Level of Care Assessment Items TOOL 4 Quantity Score []  - Use  when only an EandM is performed on FOLLOW-UP visit 0 ASSESSMENTS - Nursing Assessment / Reassessment X - Reassessment of Co-morbidities (includes updates in patient status) 1 10 X- 1 5 Reassessment of Adherence to Treatment Plan ASSESSMENTS - Wound and Skin Assessment / Reassessment X - Simple Wound Assessment / Reassessment - one wound 1 5 []  - 0 Complex Wound Assessment / Reassessment - multiple wounds []  - 0 Dermatologic / Skin Assessment (not related to wound area) ASSESSMENTS - Focused Assessment []  - Circumferential Edema Measurements - multi extremities 0 []  - 0 Nutritional Assessment / Counseling / Intervention []  - 0 Lower Extremity Assessment (monofilament, tuning fork, pulses) []  - 0 Peripheral Arterial Disease Assessment (using hand held doppler) ASSESSMENTS - Ostomy and/or Continence Assessment and Care []  - Incontinence Assessment and Management 0 []  - 0 Ostomy Care Assessment and Management (repouching, etc.) PROCESS - Coordination of Care X - Simple Patient / Family Education for ongoing care 1 15 []  - 0 Complex (extensive) Patient / Family Education for ongoing care X- 1 10 Staff obtains Programmer, systems, Records, Test Results / Process Orders []  - 0 Staff telephones HHA, Nursing Homes / Clarify orders / etc []  - 0 Routine Transfer to another Facility (non-emergent condition) []  - 0 Routine Hospital Admission (non-emergent condition) []  - 0 New Admissions / Biomedical engineer / Ordering NPWT, Apligraf, etc. []  - 0 Emergency Hospital Admission (emergent condition) X- 1 10 Simple Discharge Coordination []  - 0 Complex (extensive) Discharge Coordination PROCESS - Special Needs []  - Pediatric / Minor Patient Management 0 []  - 0 Isolation Patient Management []  - 0 Hearing / Language / Visual special needs []  - 0 Assessment of Community assistance (transportation, D/C planning, etc.) []  - 0 Additional assistance / Altered mentation []  - 0 Support  Surface(s) Assessment (bed, cushion, seat, etc.) INTERVENTIONS - Wound Cleansing / Measurement Csaszar, Almedia C. ([id number]) X- 1 5 Simple Wound Cleansing - one wound []  - 0 Complex  Wound Cleansing - multiple wounds X- 1 5 Wound Imaging (photographs - any number of wounds) []  - 0 Wound Tracing (instead of photographs) X- 1 5 Simple Wound Measurement - one wound []  - 0 Complex Wound Measurement - multiple wounds INTERVENTIONS - Wound Dressings []  - Small Wound Dressing one or multiple wounds 0 X- 1 15 Medium Wound Dressing one or multiple wounds []  - 0 Large Wound Dressing one or multiple wounds []  - 0 Application of Medications - topical []  - 0 Application of Medications - injection INTERVENTIONS - Miscellaneous []  - External ear exam 0 []  - 0 Specimen Collection (cultures, biopsies, blood, body fluids, etc.) []  - 0 Specimen(s) / Culture(s) sent or taken to Lab for analysis []  - 0 Patient Transfer (multiple staff / Civil Service fast streamer / Similar devices) []  - 0 Simple Staple / Suture removal (25 or less) []  - 0 Complex Staple / Suture removal (26 or more) []  - 0 Hypo / Hyperglycemic Management (close monitor of Blood Glucose) []  - 0 Ankle / Brachial Index (ABI) - do not check if billed separately X- 1 5 Vital Signs Has the patient been seen at the hospital within the last three years: Yes Total Score: 90 Level Of Care: New/Established - Level 3 Electronic Signature(s) Signed: 04/07/2020 5:04:05 PM By: Gretta Cool, BSN, RN, [name], Kim RN, BSN Entered By: Gretta Cool, BSN, RN, [name], Kim on 04/07/2020 14:15:18 [name] ([id number]) -------------------------------------------------------------------------------- Encounter Discharge Information Details Patient Name: [name]. Date of Service: 04/07/2020 1:30 PM Medical Record Number: [id number] Patient Account Number: [id number] Date of Birth/Sex: Jul 17, 1926 (86 y.o. F) Treating RN: Cornell Barman Primary Care Valentine Barney:  Derinda Late Other Clinician: Referring Dmario Russom: BABAOFF, MARCUS Treating Elienai Gailey/Extender: Tito Dine in Treatment: 10 Encounter Discharge Information Items Discharge Condition: Stable Ambulatory Status: Ambulatory Discharge Destination: Home Transportation: Private Auto Accompanied By: self Schedule Follow-up Appointment: Yes Clinical Summary of Care: Electronic Signature(s) Signed: 04/07/2020 5:04:05 PM By: Gretta Cool, BSN, RN, [name], Kim RN, BSN Entered By: Gretta Cool, BSN, RN, [name], Kim on 04/07/2020 14:16:33 [name] ([id number]) -------------------------------------------------------------------------------- Lower Extremity Assessment Details Patient Name: TEKEISHA, HAKIM. Date of Service: 04/07/2020 1:30 PM Medical Record Number: [id number] Patient Account Number: [id number] Date of Birth/Sex: 02-27-1926 (86 y.o. F) Treating RN: Montey Hora Primary Care Marshaun Lortie: BABAOFF, MARCUS Other Clinician: Referring Nickie Deren: BABAOFF, MARCUS Treating Corrin Hingle/Extender: Ricard Dillon Weeks in Treatment: 10 Edema Assessment Assessed: [Left: No] [Right: No] Edema: [Left: N] [Right: o] Calf Left: Right: Point of Measurement: 30 cm From Medial Instep cm 29 cm Ankle Left: Right: Point of Measurement: 10 cm From Medial Instep cm 21 cm Vascular Assessment Pulses: Dorsalis Pedis Palpable: [Right:Yes] Electronic Signature(s) Signed: 04/07/2020 4:35:33 PM By: Montey Hora Entered By: Montey Hora on 04/07/2020 13:50:41 Brege, [name] ([id number]) -------------------------------------------------------------------------------- Multi Wound Chart Details Patient Name: [name]. Date of Service: 04/07/2020 1:30 PM Medical Record Number: [id number] Patient Account Number: [id number] Date of Birth/Sex: 1926/01/03 (86 y.o. F) Treating RN: Cornell Barman Primary Care Hakim Minniefield: Derinda Late Other Clinician: Referring Shefali Ng: BABAOFF, MARCUS Treating  Jheri Mitter/Extender: Ricard Dillon Weeks in Treatment: 10 Vital Signs Height(in): 62 Pulse(bpm): 66 Weight(lbs): 62 Blood Pressure(mmHg): 116/71 Body Mass Index(BMI): 18 Temperature(F): 97.9 Respiratory Rate(breaths/min): 16 Photos: [N/A:N/A] Wound Location: Right, Lateral Lower Leg N/A N/A Wounding Event: Trauma N/A N/A Primary Etiology: Venous Leg Ulcer N/A N/A Secondary Etiology: Trauma, Other N/A N/A Comorbid History: Cataracts, Deep Vein Thrombosis, N/A N/A Hypertension, Osteoarthritis, Dementia Date Acquired: 01/20/2020 N/A N/A Weeks of Treatment:  10 N/A N/A Wound Status: Open N/A N/A Measurements L x W x D (cm) 0.8x0.5x0.1 N/A N/A Area (cm) : 0.314 N/A N/A Volume (cm) : 0.031 N/A N/A % Reduction in Area: 97.80% N/A N/A % Reduction in Volume: 98.90% N/A N/A Classification: Full Thickness Without Exposed N/A N/A Support Structures Exudate Amount: Medium N/A N/A Exudate Type: Serous N/A N/A Exudate Color: amber N/A N/A Wound Margin: Flat and Intact N/A N/A Granulation Amount: Large (67-100%) N/A N/A Granulation Quality: Pink N/A N/A Necrotic Amount: Small (1-33%) N/A N/A Exposed Structures: Fat Layer (Subcutaneous Tissue) N/A N/A Exposed: Yes Fascia: No Tendon: No Muscle: No Joint: No Bone: No Epithelialization: Small (1-33%) N/A N/A Treatment Notes Wound #3 (Right, Lateral Lower Leg) Notes hblue, ABD, kerlix, coban DELENA, CASEBEER ([id number]) Electronic Signature(s) Signed: 04/07/2020 4:26:25 PM By: Linton Ham MD Entered By: Linton Ham on 04/07/2020 14:24:42 [name] ([id number]) -------------------------------------------------------------------------------- Multi-Disciplinary Care Plan Details Patient Name: [name]. Date of Service: 04/07/2020 1:30 PM Medical Record Number: [id number] Patient Account Number: [id number] Date of Birth/Sex: 1926-01-17 (86 y.o. F) Treating RN: Cornell Barman Primary Care Lory Galan: Derinda Late Other Clinician: Referring Dniya Neuhaus: BABAOFF, MARCUS Treating Rosaleigh Brazzel/Extender: Tito Dine in Treatment: 10 Active Inactive Necrotic Tissue Nursing Diagnoses: Impaired tissue integrity related to necrotic/devitalized tissue Goals: Necrotic/devitalized tissue will be minimized in the wound bed Date Initiated: 01/28/2020 Target Resolution Date: 02/04/2020 Goal Status: Active Interventions: Assess patient pain level pre-, during and post procedure and prior to discharge Treatment Activities: Apply topical anesthetic as ordered : 01/28/2020 Notes: Orientation to the Wound Care Program Nursing Diagnoses: Knowledge deficit related to the wound healing center program Goals: Patient/caregiver will verbalize understanding of the [place] Date Initiated: 01/28/2020 Target Resolution Date: 02/04/2020 Goal Status: Active Interventions: Provide education on orientation to the wound center Notes: Soft Tissue Infection Nursing Diagnoses: Impaired tissue integrity Goals: Patient will remain free of wound infection Date Initiated: 01/28/2020 Target Resolution Date: 02/11/2020 Goal Status: Active Interventions: Assess signs and symptoms of infection every visit Notes: Wound/Skin Impairment Nursing Diagnoses: Impaired tissue integrity Goals: Ulcer/skin breakdown will have a volume reduction of 30% by week 4 LAMANDA, RUDDER ([id number]) Date Initiated: 01/28/2020 Target Resolution Date: 02/27/2020 Goal Status: Active Interventions: Assess ulceration(s) every visit Treatment Activities: Skin care regimen initiated : 01/28/2020 Topical wound management initiated : 01/28/2020 Notes: Electronic Signature(s) Signed: 04/07/2020 5:04:05 PM By: Gretta Cool, BSN, RN, [name], Kim RN, BSN Entered By: Gretta Cool, BSN, RN, [name], Kim on 04/07/2020 14:10:59 [name] ([id number]) -------------------------------------------------------------------------------- Pain Assessment  Details Patient Name: [name]. Date of Service: 04/07/2020 1:30 PM Medical Record Number: [id number] Patient Account Number: [id number] Date of Birth/Sex: 03-17-1926 (86 y.o. F) Treating RN: Montey Hora Primary Care Kenshin Splawn: Derinda Late Other Clinician: Referring Bea Duren: BABAOFF, MARCUS Treating Jamario Colina/Extender: Ricard Dillon Weeks in Treatment: 10 Active Problems Location of Pain Severity and Description of Pain Patient Has Paino Yes Site Locations Pain Location: Pain in Ulcers With Dressing Change: Yes Pain Management and Medication Current Pain Management: Electronic Signature(s) Signed: 04/07/2020 4:35:33 PM By: Montey Hora Entered By: Montey Hora on 04/07/2020 13:44:31 Mciver, [name] ([id number]) -------------------------------------------------------------------------------- Patient/Caregiver Education Details Patient Name: [name] Date of Service: 04/07/2020 1:30 PM Medical Record Number: [id number] Patient Account Number: [id number] Date of Birth/Gender: 1926-07-01 (86 y.o. F) Treating RN: Cornell Barman Primary Care Physician: Derinda Late Other Clinician: Referring Physician: BABAOFF, MARCUS Treating Physician/Extender: Tito Dine in Treatment: 10 Education Assessment Education Provided To:  Patient Education Topics Provided Wound/Skin Impairment: Handouts: Caring for Your Ulcer Methods: Demonstration, Explain/Verbal Responses: State content correctly Electronic Signature(s) Signed: 04/07/2020 5:04:05 PM By: Gretta Cool, BSN, RN, [name], Kim RN, BSN Entered By: Gretta Cool, BSN, RN, [name], Kim on 04/07/2020 14:15:39 [name] ([id number]) -------------------------------------------------------------------------------- Wound Assessment Details Patient Name: MERRIEL, ZINGER. Date of Service: 04/07/2020 1:30 PM Medical Record Number: [id number] Patient Account Number: [id number] Date of Birth/Sex: 16-Jan-1926 (86 y.o.  F) Treating RN: Montey Hora Primary Care Fisher Hargadon: BABAOFF, MARCUS Other Clinician: Referring Rhona Fusilier: BABAOFF, MARCUS Treating Kahlan Engebretson/Extender: Ricard Dillon Weeks in Treatment: 10 Wound Status Wound Number: 3 Primary Venous Leg Ulcer Etiology: Wound Location: Right, Lateral Lower Leg Secondary Trauma, Other Wounding Event: Trauma Etiology: Date Acquired: 01/20/2020 Wound Status: Open Weeks Of Treatment: 10 Comorbid Cataracts, Deep Vein Thrombosis, Hypertension, Clustered Wound: No History: Osteoarthritis, Dementia Photos Wound Measurements Length: (cm) 0.8 Width: (cm) 0.5 Depth: (cm) 0.1 Area: (cm) 0.314 Volume: (cm) 0.031 % Reduction in Area: 97.8% % Reduction in Volume: 98.9% Epithelialization: Small (1-33%) Tunneling: No Undermining: No Wound Description Classification: Full Thickness Without Exposed Support Structu Wound Margin: Flat and Intact Exudate Amount: Medium Exudate Type: Serous Exudate Color: amber res Foul Odor After Cleansing: No Slough/Fibrino Yes Wound Bed Granulation Amount: Large (67-100%) Exposed Structure Granulation Quality: Pink Fascia Exposed: No Necrotic Amount: Small (1-33%) Fat Layer (Subcutaneous Tissue) Exposed: Yes Necrotic Quality: Adherent Slough Tendon Exposed: No Muscle Exposed: No Joint Exposed: No Bone Exposed: No Treatment Notes Wound #3 (Right, Lateral Lower Leg) Notes hblue, ABD, kerlix, coban Electronic Signature(s) LEIRA, REGINO ([id number]) Signed: 04/07/2020 4:35:33 PM By: Montey Hora Entered By: Montey Hora on 04/07/2020 13:52:46 Nissley, [name] ([id number]) -------------------------------------------------------------------------------- Vitals Details Patient Name: [name]. Date of Service: 04/07/2020 1:30 PM Medical Record Number: [id number] Patient Account Number: [id number] Date of Birth/Sex: 1926/10/20 (86 y.o. F) Treating RN: Cornell Barman Primary Care Norma Montemurro: BABAOFF,  MARCUS Other Clinician: Referring Reeve Mallo: BABAOFF, MARCUS Treating Brinkley Peet/Extender: Ricard Dillon Weeks in Treatment: 10 Vital Signs Time Taken: 13:30 Temperature (F): 97.9 Height (in): 62 Pulse (bpm): 82 Weight (lbs): 98 Respiratory Rate (breaths/min): 16 Body Mass Index (BMI): 17.9 Blood Pressure (mmHg): 116/71 Reference Range: 80 - 120 mg / dl Electronic Signature(s) Signed: 04/07/2020 4:31:33 PM By: Lorine Bears RCP, RRT, CHT Entered By: Lorine Bears on 04/07/2020 13:35:33

## 2020-04-20 NOTE — Progress Notes (Signed)
[name], [name] ([id number]) Visit Report for 03/03/2020 Arrival Information Details [name], [name]. Date of Service: 03/03/2020 3:00 PM Medical Record Number: [id number] Patient Account Number: [id number] Date of Birth/Sex: May 08, 1926 (86 y.o. F) Treating RN: Cornell Barman Primary Care Jen Benedict: Derinda Late Other Clinician: Referring Baudelio Karnes: BABAOFF, MARCUS Treating Carden Teel/Extender: Tito Dine in Treatment: 5 Visit Information History Since Last Visit Added or deleted any medications: No Patient Arrived: Ambulatory Any new allergies or adverse reactions: No Arrival Time: 15:06 Had a fall or experienced change in No Accompanied By: husband activities of daily living that may affect Transfer Assistance: None risk of falls: Patient Identification Verified: Yes Signs or symptoms of abuse/neglect since last visito No Secondary Verification Process Completed: Yes Hospitalized since last visit: No Patient Has Alerts: Yes Implantable device outside of the clinic excluding No Patient Alerts: Patient on Blood Thinner cellular tissue based products placed in the center warfarin since last visit: Has Dressing in Place as Prescribed: Yes Has Compression in Place as Prescribed: Yes Pain Present Now: No Electronic Signature(s) Signed: 03/03/2020 4:54:55 PM By: Lorine Bears RCP, RRT, CHT Entered By: Lorine Bears on 03/03/2020 15:06:51 Sokolowski, [name] ([id number]) -------------------------------------------------------------------------------- Encounter Discharge Information Details [name]. Date of Service: 03/03/2020 3:00 PM Medical Record Number: [id number] Patient Account Number: [id number] Date of Birth/Sex: 01/25/1926 (86 y.o. F) Treating RN: Cornell Barman Primary Care Creedon Danielski: Derinda Late Other Clinician: Referring Justus Droke: BABAOFF, MARCUS Treating Pierson Vantol/Extender: Tito Dine in Treatment: 5 Encounter Discharge Information Items Post Procedure Vitals Discharge Condition: Stable Temperature (F): 98.1 Ambulatory Status: Ambulatory Pulse (bpm): 76 Discharge Destination: Home Respiratory Rate (breaths/min): 16 Transportation: Private Auto Blood Pressure (mmHg): 143/83 Accompanied By: husband Schedule Follow-up Appointment: Yes Clinical Summary of Care: Electronic Signature(s) Signed: 04/20/2020 12:53:52 PM By: Gretta Cool, BSN, RN, [name], Kim RN, BSN Entered By: Gretta Cool, BSN, RN, [name], Kim on 03/03/2020 15:30:41 [name] ([id number]) -------------------------------------------------------------------------------- Lower Extremity Assessment Details [name], [name] Date of Service: 03/03/2020 3:00 PM Medical Record Number: [id number] Patient Account Number: [id number] Date of Birth/Sex: May 29, 1926 (86 y.o. F) Treating RN: Army Melia Primary Care Asheley Hellberg: BABAOFF, MARCUS Other Clinician: Referring Jerred Zaremba: BABAOFF, MARCUS Treating Epic Tribbett/Extender: Ricard Dillon Weeks in Treatment: 5 Edema Assessment Assessed: [Left: No] [Right: No] Edema: [Left: N] [Right: o] Calf Left: Right: Point of Measurement: 30 cm From Medial Instep cm 28 cm Ankle Left: Right: Point of Measurement: 10 cm From Medial Instep cm 20 cm Vascular Assessment Pulses: Dorsalis Pedis Palpable: [Right:Yes] Electronic Signature(s) Signed: 03/04/2020 11:19:24 AM By: Army Melia Entered By: Army Melia on 03/03/2020 15:14:58 Malena, [name] ([id number]) -------------------------------------------------------------------------------- Multi Wound Chart Details [name]. Date of Service: 03/03/2020 3:00 PM Medical Record Number: [id number] Patient Account Number: [id number] Date of Birth/Sex: 1926/05/10 (86 y.o. F) Treating RN: Cornell Barman Primary Care Hafsah Hendler: BABAOFF, MARCUS Other Clinician: Referring Shaneka Efaw: BABAOFF,  MARCUS Treating Kristion Holifield/Extender: Ricard Dillon Weeks in Treatment: 5 Vital Signs Height(in): 62 Pulse(bpm): 29 Weight(lbs): 98 Blood Pressure(mmHg): 143/83 Body Mass Index(BMI): 18 Temperature(F): 98.1 Respiratory Rate(breaths/min): 16 Photos: [N/A:N/A] Wound Location: Right, Lateral Lower Leg N/A N/A Wounding Event: Trauma N/A N/A Primary Etiology: Venous Leg Ulcer N/A N/A Secondary Etiology: Trauma, Other N/A N/A Comorbid History: Cataracts, Deep Vein Thrombosis, N/A N/A Hypertension, Osteoarthritis, Dementia Date Acquired: 01/20/2020 N/A N/A Weeks of Treatment: 5 N/A N/A Wound Status: Open N/A N/A Measurements L x W x D (cm) 4.5x4x0.2 N/A N/A Area (cm) :  14.137 N/A N/A Volume (cm) : 2.827 N/A N/A % Reduction in Area: 0.00% N/A N/A % Reduction in Volume: 0.00% N/A N/A Classification: Full Thickness Without Exposed N/A N/A Support Structures Exudate Amount: Medium N/A N/A Exudate Type: Purulent N/A N/A Exudate Color: yellow, brown, green N/A N/A Wound Margin: Flat and Intact N/A N/A Granulation Amount: Small (1-33%) N/A N/A Granulation Quality: Pink N/A N/A Necrotic Amount: Large (67-100%) N/A N/A Exposed Structures: Fat Layer (Subcutaneous Tissue) N/A N/A Exposed: Yes Fascia: No Tendon: No Muscle: No Joint: No Bone: No Epithelialization: None N/A N/A Debridement: Debridement - Excisional N/A N/A Pre-procedure Verification/Time 15:26 N/A N/A Out Taken: Pain Control: Lidocaine N/A N/A Tissue Debrided: Subcutaneous, Slough N/A N/A Level: Skin/Subcutaneous Tissue N/A N/A Debridement Area (sq cm): 18 N/A N/A Instrument: Curette N/A N/A Bleeding: Moderate N/A N/A Hemostasis Achieved: Pressure N/A N/A [name], LAYMON. ([id number]) Debridement Treatment Procedure was tolerated well N/A N/A Response: Post Debridement 4.5x4x0.3 N/A N/A Measurements L x W x D (cm) Post Debridement Volume: 4.241 N/A N/A (cm) Procedures Performed: Debridement N/A  N/A Treatment Notes Wound #3 (Right, Lateral Lower Leg) Notes Santyl, hblue, ABD, kerlix, coban Electronic Signature(s) Signed: 03/04/2020 7:53:18 AM By: Linton Ham MD Entered By: Linton Ham on 03/03/2020 16:57:06 Norby, [name] ([id number]) -------------------------------------------------------------------------------- Multi-Disciplinary Care Plan Details [name]. Date of Service: 03/03/2020 3:00 PM Medical Record Number: [id number] Patient Account Number: [id number] Date of Birth/Sex: 03-07-1926 (86 y.o. F) Treating RN: Cornell Barman Primary Care Sarrinah Gardin: Derinda Late Other Clinician: Referring Kumar Falwell: BABAOFF, MARCUS Treating Icel Castles/Extender: Tito Dine in Treatment: 5 Active Inactive Necrotic Tissue Nursing Diagnoses: Impaired tissue integrity related to necrotic/devitalized tissue Goals: Necrotic/devitalized tissue will be minimized in the wound bed Date Initiated: 01/28/2020 Target Resolution Date: 02/04/2020 Goal Status: Active Interventions: Assess patient pain level pre-, during and post procedure and prior to discharge Treatment Activities: Apply topical anesthetic as ordered : 01/28/2020 Notes: Orientation to the Wound Care Program Nursing Diagnoses: Knowledge deficit related to the wound healing center program Goals: Patient/caregiver will verbalize understanding of the [place] Date Initiated: 01/28/2020 Target Resolution Date: 02/04/2020 Goal Status: Active Interventions: Provide education on orientation to the wound center Notes: Soft Tissue Infection Nursing Diagnoses: Impaired tissue integrity Goals: Patient will remain free of wound infection Date Initiated: 01/28/2020 Target Resolution Date: 02/11/2020 Goal Status: Active Interventions: Assess signs and symptoms of infection every visit Notes: Wound/Skin Impairment Nursing Diagnoses: Impaired tissue integrity Goals: Ulcer/skin  breakdown will have a volume reduction of 30% by week 4 [name], [name] ([id number]) Date Initiated: 01/28/2020 Target Resolution Date: 02/27/2020 Goal Status: Active Interventions: Assess ulceration(s) every visit Treatment Activities: Skin care regimen initiated : 01/28/2020 Topical wound management initiated : 01/28/2020 Notes: Electronic Signature(s) Signed: 04/20/2020 12:53:52 PM By: Gretta Cool, BSN, RN, [name], Kim RN, BSN Entered By: Gretta Cool, BSN, RN, [name], Kim on 03/03/2020 15:24:48 [name] ([id number]) -------------------------------------------------------------------------------- Pain Assessment Details [name]. Date of Service: 03/03/2020 3:00 PM Medical Record Number: [id number] Patient Account Number: [id number] Date of Birth/Sex: 1926/10/10 (86 y.o. F) Treating RN: Army Melia Primary Care Amariya Liskey: Derinda Late Other Clinician: Referring Jamine Highfill: BABAOFF, MARCUS Treating Briannon Boggio/Extender: Ricard Dillon Weeks in Treatment: 5 Active Problems Location of Pain Severity and Description of Pain Patient Has Paino Yes Site Locations Pain Location: Pain in Ulcers Rate the pain. Current Pain Level: 8 Pain Management and Medication Current Pain Management: Electronic Signature(s) Signed: 03/04/2020 11:19:24 AM By: Army Melia Entered By: Army Melia  on 03/03/2020 15:13:14 [name], [name] ([id number]) -------------------------------------------------------------------------------- Patient/Caregiver Education Details [name], JECH. Date of Service: 03/03/2020 3:00 PM Medical Record Number: [id number] Patient Account Number: [id number] Date of Birth/Gender: Jan 03, 1926 (86 y.o. F) Treating RN: Cornell Barman Primary Care Physician: Derinda Late Other Clinician: Referring Physician: BABAOFF, MARCUS Treating Physician/Extender: Tito Dine in Treatment: 5 Education Assessment Education Provided  To: Patient Education Topics Provided Wound Debridement: Handouts: Wound Debridement Methods: Demonstration, Explain/Verbal Responses: State content correctly Wound/Skin Impairment: Handouts: Caring for Your Ulcer Methods: Demonstration, Explain/Verbal Responses: State content correctly Electronic Signature(s) Signed: 04/20/2020 12:53:52 PM By: Gretta Cool, BSN, RN, [name], Kim RN, BSN Entered By: Gretta Cool, BSN, RN, [name], Kim on 03/03/2020 15:29:31 [name] ([id number]) -------------------------------------------------------------------------------- Wound Assessment Details [name]. Date of Service: 03/03/2020 3:00 PM Medical Record Number: [id number] Patient Account Number: [id number] Date of Birth/Sex: 04-14-1926 (86 y.o. F) Treating RN: Army Melia Primary Care Jasimine Simms: BABAOFF, MARCUS Other Clinician: Referring Vanya Carberry: BABAOFF, MARCUS Treating Virginie Josten/Extender: Ricard Dillon Weeks in Treatment: 5 Wound Status Wound Number: 3 Primary Venous Leg Ulcer Etiology: Wound Location: Right, Lateral Lower Leg Secondary Trauma, Other Wounding Event: Trauma Etiology: Date Acquired: 01/20/2020 Wound Status: Open Weeks Of Treatment: 5 Comorbid Cataracts, Deep Vein Thrombosis, Hypertension, Clustered Wound: No History: Osteoarthritis, Dementia Photos Wound Measurements Length: (cm) 4.5 Width: (cm) 4 Depth: (cm) 0.2 Area: (cm) 14.137 Volume: (cm) 2.827 % Reduction in Area: 0% % Reduction in Volume: 0% Epithelialization: None Wound Description Classification: Full Thickness Without Exposed Support Structu Wound Margin: Flat and Intact Exudate Amount: Medium Exudate Type: Purulent Exudate Color: yellow, brown, green res Foul Odor After Cleansing: No Slough/Fibrino Yes Wound Bed Granulation Amount: Small (1-33%) Exposed Structure Granulation Quality: Pink Fascia Exposed: No Necrotic Amount: Large (67-100%) Fat Layer (Subcutaneous Tissue)  Exposed: Yes Necrotic Quality: Adherent Slough Tendon Exposed: No Muscle Exposed: No Joint Exposed: No Bone Exposed: No Electronic Signature(s) Signed: 03/04/2020 11:19:24 AM By: Army Melia Entered By: Army Melia on 03/03/2020 15:13:34 [name], [name] ([id number]) -------------------------------------------------------------------------------- Vitals Details [name]. Date of Service: 03/03/2020 3:00 PM Medical Record Number: [id number] Patient Account Number: [id number] Date of Birth/Sex: 02/14/1926 (86 y.o. F) Treating RN: Cornell Barman Primary Care Anayi Bricco: BABAOFF, MARCUS Other Clinician: Referring Lysha Schrade: BABAOFF, MARCUS Treating Kamla Skilton/Extender: Ricard Dillon Weeks in Treatment: 5 Vital Signs Time Taken: 15:05 Temperature (F): 98.1 Height (in): 62 Pulse (bpm): 76 Weight (lbs): 98 Respiratory Rate (breaths/min): 16 Body Mass Index (BMI): 17.9 Blood Pressure (mmHg): 143/83 Reference Range: 80 - 120 mg / dl Electronic Signature(s) Signed: 03/03/2020 4:54:55 PM By: Lorine Bears RCP, RRT, CHT Entered By: Lorine Bears on 03/03/2020 15:10:07

## 2020-04-20 NOTE — Progress Notes (Signed)
Brittany [name], Brittany [name] ([id number]) Visit Report for 03/03/2020 Debridement Details Patient Name: Brittany [name], Brittany [name]. Date of Service: 03/03/2020 3:00 PM Medical Record Number: [id number] Patient Account Number: [id number] Date of Birth/Sex: [birth year] (86 y.o. F) Treating [name]: Cornell Barman Primary Care Provider: Derinda Late Other Clinician: Referring Provider: BABAOFF, MARCUS Treating Provider/Extender: Ricard Dillon Weeks in Treatment: 5 Debridement Performed for Wound #3 Right,Lateral Lower Leg Assessment: Performed By: Physician Ricard Dillon, MD Debridement Type: Debridement Severity of Tissue Pre Debridement: Fat layer exposed Level of Consciousness (Pre- Awake and Alert procedure): Pre-procedure Verification/Time Out Yes - 15:26 Taken: Start Time: 15:26 Pain Control: Lidocaine Total Area Debrided (L x W): 4.5 (cm) x 4 (cm) = 18 (cm) Tissue and other material Viable, Non-Viable, Slough, Subcutaneous, Slough debrided: Level: Skin/Subcutaneous Tissue Debridement Description: Excisional Instrument: Curette Bleeding: Moderate Hemostasis Achieved: Pressure Response to Treatment: Procedure was tolerated well Level of Consciousness (Post- Awake and Alert procedure): Post Debridement Measurements of Total Wound Length: (cm) 4.5 Width: (cm) 4 Depth: (cm) 0.3 Volume: (cm) 4.241 Character of Wound/Ulcer Post Debridement: Requires Further Debridement Severity of Tissue Post Debridement: Fat layer exposed Post Procedure Diagnosis Same as Pre-procedure Electronic Signature(s) Signed: 03/04/2020 7:53:18 AM By: Linton Ham MD Signed: 04/20/2020 12:53:52 PM By: Gretta Cool, BSN, [name], [name], Brittany [name], Brittany [name] Entered By: Linton Ham on 03/03/2020 16:57:22 [name], Brittany [name] ([id number]) -------------------------------------------------------------------------------- HPI Details Patient Name: Brittany [name]. Date of Service: 03/03/2020 3:00 PM Medical Record Number:  [id number] Patient Account Number: [id number] Date of Birth/Sex: 10/16/1926 (86 y.o. F) Treating [name]: Cornell Barman Primary Care Provider: Derinda Late Other Clinician: Referring Provider: BABAOFF, MARCUS Treating Provider/Extender: Tito Dine in Treatment: 5 History of Present Illness HPI Description: 06/06/17 on evaluation today patient presents for evaluation concerning an ulcer which she initially had arise as a result of striking her left lower extremity money bed frame. With that being said this was roughly one month ago and unfortunately the wound despite two courses of Keflex have not really made a dramatic improvement. This has continued to drain as well as being exquisitely tender at times. Even to the point that she is having a difficult time walking. Patient does have chronic atrial fibrillation which subsequently leads to her being on long-term anticoagulant therapy and this causes ED bruising which may have contributed to this entry and where things are at this point. She does have valvular heart disease as well as hypertension and her last INR was 2.7. Currently Neosporin, Gauls, and an ace wrap has been utilized. Keflex is the antibiotic that she has been on. Patient has not had a culture up to this point and she states that her blood pressure at the primary care office yesterday was 138/72. Her pain is significant related to be an eight out of 10. No fevers, chills, nausea, or vomiting noted at this time. 06/20/17; patient was admitted here 2 weeks ago. She had a traumatic wound on her left lateral lower leg probably in the setting of some degree of venous insufficiency with surrounding cellulitis. She had been on Keflex, after we saw her she was put on doxycycline which she apparently did not tolerate. Culture that was done at the time showed Morganella which was resistant to Keflex. Apparently the patient continued to take Keflex after the appointment. She is also on  Coumadin. I had tried to change her antibiotic when the Morganella culture was brought to my attention however apparently our staff could not reach the patient to inform them of  the antibiotic change 06/26/17; patient has wound on her left lateral lower extremity in the setting of some degree of venous insufficiency. I gave her cefdinir last week and she is completing this. There is no evidence of surrounding infection. Aggressive debridement last week, wound bed looks healthier. We've been using Aquacel. They're traveling in the [place] next week we'll see her back in 2 weeks unless there are problems. 07/10/17; the patient is been using Aquacel Ag her husband is changing this with Kerlix and conform. She still complains of a lot of pain. Apparently with the antibiotics. We gave 2 or 3 weeks ago. Her INR went up to 6, this was managed by her primary physician 07/17/17; she is using Aquacel Ag and a border foam. Still discomfort although dimensions are better. 07/24/17; patient or for review of a trauma wound on her left anterior leg in the setting of some degree of chronic venous insufficiency she has been using Aquacel Ag and a border foam and making progress. 07/31/17; only a small open area of this original significant trauma is still open. Her husband is changing this every second day [Aquacel Ag] Readmission: 01/14/19 on evaluation today patient is that she seen for initial inspection during the office visit today concerning a trauma/skin tear to the left anterior lower extremity. This is roughly the same region where she was previously treated and years past when she was here in the clinic. Nonetheless upon evaluation today the patient's wound actually showed signs of decent granulation around the edge although unfortunately the skin flap that was torn back did fold under on itself and the flap itself is dying. She does have some eschar surrounded the edges of the wound at this point. Nonetheless  this also was very tender for her. The patient does have a history of hypertension, atrial fibrillation, and long-term use anticoagulant therapy. 01/21/19 on evaluation today patient's wound on the anterior portion of her lower extremity appears to still be necrotic as far as the surface of the wound is concerned. Unfortunately there is no significant improvement at this point compared to last week they were not able to get the Santyl that are using Medihoney and maybe one reason why. The other is I think this may be stained to drive. Possibly adding something such as mepitel over top of the Medihoney may help to loosen this up quite a bit which I think would be in her best interest. Also she still has a lot of erythema and is very tender to touch I'm afraid that we may need to switch to a different antibiotic to see if this will be of benefit there still really nothing that I can culture to get a better picture of what's going on and what we need to do from the standpoint of antibiotics. I do believe the patient needs a referral Tal[place]uds me to vascular in order to evaluate her blood flow based on what I'm seeing they will be able to perform TBI testing if nothing else along with the vascular evaluation to see if there's any evidence for limited blood flow to this lower extremity. 4/8; I have reviewed the patient's arterial studies and they are really quite good. There should be no arterial impediments to healing the wound on the right anterior tibial area. 4/15; patient has a small open area over the left mid tibial area. Still requiring debridement we are using Santyl here and Medihoney at home [Santyl unaffordable] 4/22; difficult, presumably traumatic area over the left mid  tibial area in the setting of chronic venous insufficiency. We have been using Santyl here and meta honey at home. The wound surface is getting gradually better and slight improvement in dimensions. Her husband is changing  this daily 4/29; left mid tibia presumably traumatic wound in the setting of chronic venous insufficiency. We have been using Santyl and a combination of Medihoney at home. Arrives today with a much better looking healthy granulated surface. We will change the primary dressing to [facility] 5/6; left mid tibia smaller reasonably healthy looking wound using Hydrofera Blue since last week 5/20-Patient returns to clinic with a left mid tibial wound looking better, we have been using Hydrofera Blue since the previous week 5/27; patient returns to clinic with a left mid tibial wound covered in raised eschar. We have been using Hydrofera Blue her husband changes the dressings using a border foam cover READMISSION 01/28/20 Brittany [name] is a patient who is now 86 years old. As usual she comes in with her husband who is her primary caregiver. They were here last from March through May 2020 with a traumatic wound on the left anterior tibia we eventually heal this out. She has chronic venous insufficiency. She is also here previously in 2018. Her current problem started 8 days ago. She went in the hospital for a thoracic level kyphoplasty. The procedure she developed a skin tear on her leg. This was mentioned to her. Her husband is applying Medihoney to the area and they are in for our review of this. She finds this much too uncomfortable to attempt ABIs although her ABIs when she was in the clinic last year were quite normal bilaterally Croson, Gretna C. ([id number]) Past medical history she is not a diabetic. As noted she had a T9 and T6 kyphoplasty, Alzheimer's disease, chronic atrial fibrillation on Coumadin and hypertension 4/14; patient arrives in clinic having removed part of the wrap. We attempted to leave this on all week. Unfortunately none that none of the tissue that looks semiviable last week has remained viable. She required an extensive debridement. We have been using silver alginate 4/21;  the patient arrived today with tremendous erythema around the large part of the wound on the right lateral leg. This extends medially and down towards her foot. I marked the area but I think the extent of the likely cellulitis is beyond what can be safely managed as an outpatient. 02/23/2020 upon evaluation today patient is seen today though she is typically followed by Dr. Dellia Nims. Last time he saw her he sent her to the hospital and she subsequently was in the hospital for some time. In fact it was from 02/11/2020 through 02/19/2020. During that time she was on antibiotic therapy by way of IV antibiotics. Her leg does appear to be doing better today compared to what we saw then. Nonetheless she had a culture that was obtained while she was in the hospital by Dr. Steva Ready. This ended up not growing anything which is not really surprising considering she was on antibiotics which were fairly strong at the time of culture. There was no growth at all after 2 days. With that being said Dr. Steva Ready apparently discussed with Dr. Dellia Nims per her note anyway that she wanted the patient off of the antibiotics for a week and then to repeat a culture in the outpatient setting. She also has a suspicion for the possibility of pyoderma and has referred the patient to Dr. Nehemiah Massed and the patient is supposed to be seeing  him sometime I believe Thursday of this week. Nonetheless I explained that pyoderma can sometimes begin as a injury or insult that then worsens. Obviously the initial injury here appears to have been a scraping of her leg that occurred when the patient was being transferred off of the table after a kyphoplasty procedure. Fortunately again as mentioned the infection seems to be doing significantly better based on what I am seeing at this point compared to previous pictures that I reviewed today. 5/5; the patient is back to my clinic today after seeing with stone 2 days ago. She is on [employer] with  a Kerlix Coban I have reviewed her recent history. We had sent her to the hospital with presumed cellulitis considerable erythema in the lateral leg. She received prolonged courses of antibiotics in the hospital. I received a call from one of the hospitalist to report that he didn't feel that she was responding to antibiotics although as noted 2 days ago by Jeri Cos the wound actually looks a lot better along with the periwound skin which is not nearly as erythematous. He raised the possibility of pyoderma gangrenosum which also had been brought up by Dr. Steva Ready the infectious disease consultant. The discharge culture was negative but she was still on antibiotics The pyoderma gangrenosum apparently has something to do with the fact that both her sons have Crohn's disease although the patient does not have Crohn's disease. I told the doctor that call me I didn't really think this fit with the history which was a trauma induced wound why the patient was having a kyphoplasty. In fact her husband is still very angry about this with the hospital. She also has severe chronic venous insufficiency with hemosiderin and stasis dermatitis and has had wounds in both legs. 5/12; venous insufficiency wound on the right lower lateral lower leg initially trauma. Still debris on the surface of this wound that undergoes a difficult debridement. We have been using Hydrofera Blue Electronic Signature(s) Signed: 03/04/2020 7:53:18 AM By: Linton Ham MD Entered By: Linton Ham on 03/03/2020 16:58:55 Brittany [name] ([id number]) -------------------------------------------------------------------------------- Physical Exam Details Patient Name: ADDISSON, FRATE C. Date of Service: 03/03/2020 3:00 PM Medical Record Number: [id number] Patient Account Number: [id number] Date of Birth/Sex: [birth year] (86 y.o. F) Treating [name]: Cornell Barman Primary Care Provider: Derinda Late Other Clinician: Referring  Provider: BABAOFF, MARCUS Treating Provider/Extender: Ricard Dillon Weeks in Treatment: 5 Constitutional Patient is hypertensive.. Pulse regular and within target range for patient.Marland Kitchen Respirations regular, non-labored and within target range.. Temperature is normal and within the target range for the patient.Marland Kitchen appears in no distress. Notes Wound exam; very wound remains clear of infection. She has hemosiderin deposition stasis dermatitis. Tightly adherent debris very difficult debridement which she tolerates poorly. We have good edema control. No evidence of surrounding infection Electronic Signature(s) Signed: 03/04/2020 7:53:18 AM By: Linton Ham MD Entered By: Linton Ham on 03/03/2020 16:59:56 First, Brittany [name] ([id number]) -------------------------------------------------------------------------------- Physician Orders Details Patient Name: Brittany [name] Date of Service: 03/03/2020 3:00 PM Medical Record Number: [id number] Patient Account Number: [id number] Date of Birth/Sex: 11/03/1925 (86 y.o. F) Treating [name]: Cornell Barman Primary Care Provider: BABAOFF, MARCUS Other Clinician: Referring Provider: BABAOFF, MARCUS Treating Provider/Extender: Tito Dine in Treatment: 5 Verbal / Phone Orders: No Diagnosis Coding Wound Cleansing Wound #3 Right,Lateral Lower Leg o Cleanse wound with mild soap and water Anesthetic (add to Medication List) Wound #3 Right,Lateral Lower Leg o Topical Lidocaine 4% cream applied to wound bed prior  to debridement (In Clinic Only). Primary Wound Dressing o Santyl Ointment - under Hydrafera Blue o Hydrafera Blue Ready Transfer Secondary Dressing Wound #3 Right,Lateral Lower Leg o ABD pad o Kerlix and Coban Dressing Change Frequency Wound #3 Right,Lateral Lower Leg o Change Dressing Monday, Wednesday, Friday Follow-up Appointments Wound #3 Right,Lateral Lower Leg o Return Appointment in 1 week. Edema  Control Wound #3 Right,Lateral Lower Leg o Kerlix and Coban - Right Lower Extremity o Elevate legs to the level of the heart and pump ankles as often as possible Additional Orders / Instructions Wound #3 Right,Lateral Lower Leg o Increase protein intake. Home Health Wound #3 [place] Visits - Monday and Friday (Wednesdays in clinic) [facility] Nurse may visit PRN to address patientos wound care needs. o FACE TO FACE ENCOUNTER: MEDICARE and MEDICAID PATIENTS: I certify that this patient is under my care and that I had a face-to-face encounter that meets the physician face-to-face encounter requirements with this patient on this date. The encounter with the patient was in whole or in part for the following MEDICAL CONDITION: (primary reason for [place]) MEDICAL NECESSITY: I certify, that based on my findings, NURSING services are a medically necessary home health service. HOME BOUND STATUS: I certify that my clinical findings support that this patient is homebound (i.e., Due to illness or injury, pt requires aid of supportive devices such as crutches, cane, wheelchairs, walkers, the use of special transportation or the assistance of another person to leave their place of residence. There is a normal inability to leave the home and doing so requires considerable and taxing effort. Other absences are for medical reasons / religious services and are infrequent or of short duration when for other reasons). o If current dressing causes regression in wound condition, may D/C ordered dressing product/s and apply Normal Saline Moist Dressing daily until next [place] / Other MD appointment. [facility] of regression in wound condition at [phone]. o Please direct any NON-WOUND related issues/requests for orders to patient's Primary Care Physician Brittany [name], Brittany [name] ([id number]) Patient  Medications Allergies: No Known Drug Allergies Notifications Medication Indication Start End Santyl 03/03/2020 DOSE topical 250 unit/gram ointment - ointment topical to wound llightly as directed Electronic Signature(s) Signed: 03/03/2020 5:03:32 PM By: Linton Ham MD Entered By: Linton Ham on 03/03/2020 17:03:31 Cordrey, Brittany [name] ([id number]) -------------------------------------------------------------------------------- Problem List Details Patient Name: Brittany [name]. Date of Service: 03/03/2020 3:00 PM Medical Record Number: [id number] Patient Account Number: [id number] Date of Birth/Sex: [birth year] (86 y.o. F) Treating [name]: Cornell Barman Primary Care Provider: Derinda Late Other Clinician: Referring Provider: BABAOFF, MARCUS Treating Provider/Extender: Tito Dine in Treatment: 5 Active Problems ICD-10 Encounter Code Description Active Date MDM Diagnosis S81.811D Laceration without foreign body, right lower leg, subsequent encounter 01/28/2020 No Yes L97.812 Non-pressure chronic ulcer of other part of right lower leg with fat layer 01/28/2020 No Yes exposed I87.321 Chronic venous hypertension (idiopathic) with inflammation of right 01/28/2020 No Yes lower extremity Inactive Problems Resolved Problems Electronic Signature(s) Signed: 03/04/2020 7:53:18 AM By: Linton Ham MD Entered By: Linton Ham on 03/03/2020 16:56:55 Marcum, Brittany [name] ([id number]) -------------------------------------------------------------------------------- Progress Note Details Patient Name: Brittany [name]. Date of Service: 03/03/2020 3:00 PM Medical Record Number: [id number] Patient Account Number: [id number] Date of Birth/Sex: December 10, 1925 (86 y.o. F) Treating [name]: Cornell Barman Primary Care Provider: Derinda Late Other Clinician: Referring Provider: BABAOFF, MARCUS Treating Provider/Extender: Ricard Dillon Weeks in Treatment: 5 Subjective History  of Present Illness  (HPI) 06/06/17 on evaluation today patient presents for evaluation concerning an ulcer which she initially had arise as a result of striking her left lower extremity money bed frame. With that being said this was roughly one month ago and unfortunately the wound despite two courses of Keflex have not really made a dramatic improvement. This has continued to drain as well as being exquisitely tender at times. Even to the point that she is having a difficult time walking. Patient does have chronic atrial fibrillation which subsequently leads to her being on long-term anticoagulant therapy and this causes ED bruising which may have contributed to this entry and where things are at this point. She does have valvular heart disease as well as hypertension and her last INR was 2.7. Currently Neosporin, Gauls, and an ace wrap has been utilized. Keflex is the antibiotic that she has been on. Patient has not had a culture up to this point and she states that her blood pressure at the primary care office yesterday was 138/72. Her pain is significant related to be an eight out of 10. No fevers, chills, nausea, or vomiting noted at this time. 06/20/17; patient was admitted here 2 weeks ago. She had a traumatic wound on her left lateral lower leg probably in the setting of some degree of venous insufficiency with surrounding cellulitis. She had been on Keflex, after we saw her she was put on doxycycline which she apparently did not tolerate. Culture that was done at the time showed Morganella which was resistant to Keflex. Apparently the patient continued to take Keflex after the appointment. She is also on Coumadin. I had tried to change her antibiotic when the Morganella culture was brought to my attention however apparently our staff could not reach the patient to inform them of the antibiotic change 06/26/17; patient has wound on her left lateral lower extremity in the setting of some degree of venous insufficiency.  I gave her cefdinir last week and she is completing this. There is no evidence of surrounding infection. Aggressive debridement last week, wound bed looks healthier. We've been using Aquacel. They're traveling in the [place] next week we'll see her back in 2 weeks unless there are problems. 07/10/17; the patient is been using Aquacel Ag her husband is changing this with Kerlix and conform. She still complains of a lot of pain. Apparently with the antibiotics. We gave 2 or 3 weeks ago. Her INR went up to 6, this was managed by her primary physician 07/17/17; she is using Aquacel Ag and a border foam. Still discomfort although dimensions are better. 07/24/17; patient or for review of a trauma wound on her left anterior leg in the setting of some degree of chronic venous insufficiency she has been using Aquacel Ag and a border foam and making progress. 07/31/17; only a small open area of this original significant trauma is still open. Her husband is changing this every second day [Aquacel Ag] Readmission: 01/14/19 on evaluation today patient is that she seen for initial inspection during the office visit today concerning a trauma/skin tear to the left anterior lower extremity. This is roughly the same region where she was previously treated and years past when she was here in the clinic. Nonetheless upon evaluation today the patient's wound actually showed signs of decent granulation around the edge although unfortunately the skin flap that was torn back did fold under on itself and the flap itself is dying. She does have some eschar surrounded the  edges of the wound at this point. Nonetheless this also was very tender for her. The patient does have a history of hypertension, atrial fibrillation, and long-term use anticoagulant therapy. 01/21/19 on evaluation today patient's wound on the anterior portion of her lower extremity appears to still be necrotic as far as the surface of the wound is concerned.  Unfortunately there is no significant improvement at this point compared to last week they were not able to get the Santyl that are using Medihoney and maybe one reason why. The other is I think this may be stained to drive. Possibly adding something such as mepitel over top of the Medihoney may help to loosen this up quite a bit which I think would be in her best interest. Also she still has a lot of erythema and is very tender to touch I'm afraid that we may need to switch to a different antibiotic to see if this will be of benefit there still really nothing that I can culture to get a better picture of what's going on and what we need to do from the standpoint of antibiotics. I do believe the patient needs a referral Talmuds me to vascular in order to evaluate her blood flow based on what I'm seeing they will be able to perform TBI testing if nothing else along with the vascular evaluation to see if there's any evidence for limited blood flow to this lower extremity. 4/8; I have reviewed the patient's arterial studies and they are really quite good. There should be no arterial impediments to healing the wound on the right anterior tibial area. 4/15; patient has a small open area over the left mid tibial area. Still requiring debridement we are using Santyl here and Medihoney at home [Santyl unaffordable] 4/22; difficult, presumably traumatic area over the left mid tibial area in the setting of chronic venous insufficiency. We have been using Santyl here and meta honey at home. The wound surface is getting gradually better and slight improvement in dimensions. Her husband is changing this daily 4/29; left mid tibia presumably traumatic wound in the setting of chronic venous insufficiency. We have been using Santyl and a combination of Medihoney at home. Arrives today with a much better looking healthy granulated surface. We will change the primary dressing to [facility] 5/6; left mid tibia  smaller reasonably healthy looking wound using Hydrofera Blue since last week 5/20-Patient returns to clinic with a left mid tibial wound looking better, we have been using Hydrofera Blue since the previous week 5/27; patient returns to clinic with a left mid tibial wound covered in raised eschar. We have been using Hydrofera Blue her husband changes the dressings using a border foam cover READMISSION 01/28/20 Brittany [name] is a patient who is now 86 years old. As usual she comes in with her husband who is her primary caregiver. They were here last from March through May 2020 with a traumatic wound on the left anterior tibia we eventually heal this out. She has chronic venous insufficiency. She is also here previously in 2018. Her current problem started 8 days ago. She went in the hospital for a thoracic level kyphoplasty. The procedure she developed a skin tear on her leg. This was mentioned to her. Her husband is applying Medihoney to the area and they are in for our review of this. She finds this much too uncomfortable to attempt ABIs although her ABIs when she was in the clinic last year were quite normal bilaterally Kelly, Brittany  C. ([id number]) Past medical history she is not a diabetic. As noted she had a T9 and T6 kyphoplasty, Alzheimer's disease, chronic atrial fibrillation on Coumadin and hypertension 4/14; patient arrives in clinic having removed part of the wrap. We attempted to leave this on all week. Unfortunately none that none of the tissue that looks semiviable last week has remained viable. She required an extensive debridement. We have been using silver alginate 4/21; the patient arrived today with tremendous erythema around the large part of the wound on the right lateral leg. This extends medially and down towards her foot. I marked the area but I think the extent of the likely cellulitis is beyond what can be safely managed as an outpatient. 02/23/2020 upon evaluation today  patient is seen today though she is typically followed by Dr. Dellia Nims. Last time he saw her he sent her to the hospital and she subsequently was in the hospital for some time. In fact it was from 02/11/2020 through 02/19/2020. During that time she was on antibiotic therapy by way of IV antibiotics. Her leg does appear to be doing better today compared to what we saw then. Nonetheless she had a culture that was obtained while she was in the hospital by Dr. Steva Ready. This ended up not growing anything which is not really surprising considering she was on antibiotics which were fairly strong at the time of culture. There was no growth at all after 2 days. With that being said Dr. Steva Ready apparently discussed with Dr. Dellia Nims per her note anyway that she wanted the patient off of the antibiotics for a week and then to repeat a culture in the outpatient setting. She also has a suspicion for the possibility of pyoderma and has referred the patient to Dr. Nehemiah Massed and the patient is supposed to be seeing him sometime I believe Thursday of this week. Nonetheless I explained that pyoderma can sometimes begin as a injury or insult that then worsens. Obviously the initial injury here appears to have been a scraping of her leg that occurred when the patient was being transferred off of the table after a kyphoplasty procedure. Fortunately again as mentioned the infection seems to be doing significantly better based on what I am seeing at this point compared to previous pictures that I reviewed today. 5/5; the patient is back to my clinic today after seeing with stone 2 days ago. She is on [employer] with a Kerlix Coban I have reviewed her recent history. We had sent her to the hospital with presumed cellulitis considerable erythema in the lateral leg. She received prolonged courses of antibiotics in the hospital. I received a call from one of the hospitalist to report that he didn't feel that she was responding  to antibiotics although as noted 2 days ago by Jeri Cos the wound actually looks a lot better along with the periwound skin which is not nearly as erythematous. He raised the possibility of pyoderma gangrenosum which also had been brought up by Dr. Steva Ready the infectious disease consultant. The discharge culture was negative but she was still on antibiotics The pyoderma gangrenosum apparently has something to do with the fact that both her sons have Crohn's disease although the patient does not have Crohn's disease. I told the doctor that call me I didn't really think this fit with the history which was a trauma induced wound why the patient was having a kyphoplasty. In fact her husband is still very angry about this with the hospital. She  also has severe chronic venous insufficiency with hemosiderin and stasis dermatitis and has had wounds in both legs. 5/12; venous insufficiency wound on the right lower lateral lower leg initially trauma. Still debris on the surface of this wound that undergoes a difficult debridement. We have been using Hydrofera Blue Objective Constitutional Patient is hypertensive.. Pulse regular and within target range for patient.Marland Kitchen Respirations regular, non-labored and within target range.. Temperature is normal and within the target range for the patient.Marland Kitchen appears in no distress. Vitals Time Taken: 3:05 PM, Height: 62 in, Weight: 98 lbs, BMI: 17.9, Temperature: 98.1 F, Pulse: 76 bpm, Respiratory Rate: 16 breaths/min, Blood Pressure: 143/83 mmHg. General Notes: Wound exam; very wound remains clear of infection. She has hemosiderin deposition stasis dermatitis. Tightly adherent debris very difficult debridement which she tolerates poorly. We have good edema control. No evidence of surrounding infection Integumentary (Hair, Skin) Wound #3 status is Open. Original cause of wound was Trauma. The wound is located on the Right,Lateral Lower Leg. The wound measures 4.5cm  length x 4cm width x 0.2cm depth; 14.137cm^2 area and 2.827cm^3 volume. There is Fat Layer (Subcutaneous Tissue) Exposed exposed. There is a medium amount of purulent drainage noted. The wound margin is flat and intact. There is small (1-33%) pink granulation within the wound bed. There is a large (67-100%) amount of necrotic tissue within the wound bed including Adherent Slough. Assessment Active Problems ICD-10 Laceration without foreign body, right lower leg, subsequent encounter Non-pressure chronic ulcer of other part of right lower leg with fat layer exposed Chronic venous hypertension (idiopathic) with inflammation of right lower extremity Brittany [name], [name] ([id number]) Procedures Wound #3 Pre-procedure diagnosis of Wound #3 is a Venous Leg Ulcer located on the Right,Lateral Lower Leg .Severity of Tissue Pre Debridement is: Fat layer exposed. There was a Excisional Skin/Subcutaneous Tissue Debridement with a total area of 18 sq cm performed by Ricard Dillon, MD. With the following instrument(s): Curette to remove Viable and Non-Viable tissue/material. Material removed includes Subcutaneous Tissue and Slough and after achieving pain control using Lidocaine. No specimens were taken. A time out was conducted at 15:26, prior to the start of the procedure. A Moderate amount of bleeding was controlled with Pressure. The procedure was tolerated well. Post Debridement Measurements: 4.5cm length x 4cm width x 0.3cm depth; 4.241cm^3 volume. Character of Wound/Ulcer Post Debridement requires further debridement. Severity of Tissue Post Debridement is: Fat layer exposed. Post procedure Diagnosis Wound #3: Same as Pre-Procedure Plan Wound Cleansing: Wound #3 Right,Lateral Lower Leg: Cleanse wound with mild soap and water Anesthetic (add to Medication List): Wound #3 Right,Lateral Lower Leg: Topical Lidocaine 4% cream applied to wound bed prior to debridement (In Clinic Only). Primary Wound  Dressing: Santyl Ointment - under Hydrafera Blue Hydrafera Blue Ready Transfer Secondary Dressing: Wound #3 Right,Lateral Lower Leg: ABD pad Kerlix and Coban Dressing Change Frequency: Wound #3 Right,Lateral Lower Leg: Change Dressing Monday, Wednesday, Friday Follow-up Appointments: Wound #3 Right,Lateral Lower Leg: Return Appointment in 1 week. Edema Control: Wound #3 Right,Lateral Lower Leg: Kerlix and Coban - Right Lower Extremity Elevate legs to the level of the heart and pump ankles as often as possible Additional Orders / Instructions: Wound #3 Right,Lateral Lower Leg: Increase protein intake. Home Health: Wound #3 Right,Lateral Lower Leg: Continue Home Health Visits - Monday and Friday (Wednesdays in clinic) [place] Nurse may visit PRN to address patient s wound care needs. FACE TO FACE ENCOUNTER: MEDICARE and MEDICAID PATIENTS: I certify that this patient is under  my care and that I had a face-to-face encounter that meets the physician face-to-face encounter requirements with this patient on this date. The encounter with the patient was in whole or in part for the following MEDICAL CONDITION: (primary reason for [place]) MEDICAL NECESSITY: I certify, that based on my findings, NURSING services are a medically necessary home health service. HOME BOUND STATUS: I certify that my clinical findings support that this patient is homebound (i.e., Due to illness or injury, pt requires aid of supportive devices such as crutches, cane, wheelchairs, walkers, the use of special transportation or the assistance of another person to leave their place of residence. There is a normal inability to leave the home and doing so requires considerable and taxing effort. Other absences are for medical reasons / religious services and are infrequent or of short duration when for other reasons). If current dressing causes regression in wound condition, may D/C ordered dressing product/s  and apply Normal Saline Moist Dressing daily until next Ferr[place] / Other MD appointment. [place] of regression in wound condition at [phone]. Please direct any NON-WOUND related issues/requests for orders to patient's Primary Care Physician The following medication(s) was prescribed: Santyl topical 250 unit/gram ointment ointment topical to wound llightly as directed starting 03/03/2020 1. Santyl ointment under the Hydrofera Blue 2. I am going to write a prescription for the Santyl we have home health changing this Monday Friday and we will change it on Wednesday. 3. Although Santyl is technically a daily change previously I found that this behaves somewhat different under compression. I am banking on that observation. 4. No current evidence of infection she is not on antibiotic Brittany [name], Brittany [name] ([id number]) Electronic Signature(s) Signed: 03/03/2020 5:03:59 PM By: Linton Ham MD Entered By: Linton Ham on 03/03/2020 17:03:59 Fabre, Brittany [name] ([id number]) -------------------------------------------------------------------------------- SuperBill Details Patient Name: Brittany [name]. Date of Service: 03/03/2020 Medical Record Number: [id number] Patient Account Number: [id number] Date of Birth/Sex: 11-28-1925 (86 y.o. F) Treating [name]: Cornell Barman Primary Care Provider: Derinda Late Other Clinician: Referring Provider: BABAOFF, MARCUS Treating Provider/Extender: Ricard Dillon Weeks in Treatment: 5 Diagnosis Coding ICD-10 Codes Code Description S81.811D Laceration without foreign body, right lower leg, subsequent encounter L97.812 Non-pressure chronic ulcer of other part of right lower leg with fat layer exposed I87.321 Chronic venous hypertension (idiopathic) with inflammation of right lower extremity Facility Procedures CPT4 Code: [id number] Description: 64158 - DEB SUBQ TISSUE 20 SQ CM/< Modifier: Quantity: 1 CPT4 Code: Description:  ICD-10 Diagnosis Description X09.407 Non-pressure chronic ulcer of other part of right lower leg with fat lay Modifier: er exposed Quantity: Physician Procedures CPT4 Code: 6808811 Description: 11042 - WC PHYS SUBQ TISS 20 SQ CM Modifier: Quantity: 1 CPT4 Code: Description: ICD-10 Diagnosis Description S31.594 Non-pressure chronic ulcer of other part of right lower leg with fat lay Modifier: er exposed Quantity: Electronic Signature(s) Signed: 03/04/2020 7:53:18 AM By: Linton Ham MD Entered By: Linton Ham on 03/03/2020 17:05:09

## 2020-04-21 ENCOUNTER — Encounter: Payer: Medicare Other | Admitting: Internal Medicine

## 2020-04-21 ENCOUNTER — Other Ambulatory Visit: Payer: Self-pay

## 2020-04-21 DIAGNOSIS — L97812 Non-pressure chronic ulcer of other part of right lower leg with fat layer exposed: Secondary | ICD-10-CM | POA: Diagnosis not present

## 2020-04-27 NOTE — Progress Notes (Signed)
[name], [name] ([id number]) Visit Report for 04/21/2020 Arrival Information Details [name], [name]. Date of Service: 04/21/2020 3:15 PM Medical Record Number: [id number] Patient Account Number: [id number] Date of Birth/Sex: 1926-05-05 (86 y.o. F) Treating RN: Cornell Barman Primary Care Loraine Bhullar: Derinda Late Other Clinician: Referring Mar Walmer: BABAOFF, MARCUS Treating Gizel Riedlinger/Extender: Tito Dine in Treatment: 12 Visit Information History Since Last Visit Added or deleted any medications: No Patient Arrived: Wheel Chair Any new allergies or adverse reactions: No Arrival Time: 15:18 Had a fall or experienced change in Yes Accompanied By: daughter in law activities of daily living that may affect Transfer Assistance: Manual risk of falls: Patient Identification Verified: Yes Signs or symptoms of abuse/neglect since last visito No Secondary Verification Process Completed: Yes Hospitalized since last visit: No Patient Has Alerts: Yes Implantable device outside of the clinic excluding No Patient Alerts: Patient on Blood Thinner cellular tissue based products placed in the center warfarin since last visit: Has Dressing in Place as Prescribed: Yes Pain Present Now: Yes Electronic Signature(s) Signed: 04/21/2020 4:40:20 PM By: Lorine Bears RCP, RRT, CHT Entered By: Lorine Bears on 04/21/2020 15:21:48 [name] ([id number]) -------------------------------------------------------------------------------- Clinic Level of Care Assessment Details [name]. Date of Service: 04/21/2020 3:15 PM Medical Record Number: [id number] Patient Account Number: [id number] Date of Birth/Sex: 15-Sep-1926 (86 y.o. F) Treating RN: Cornell Barman Primary Care Marieann Zipp: BABAOFF, MARCUS Other Clinician: Referring Barnes Florek: BABAOFF, MARCUS Treating Kahleb Mcclane/Extender: Tito Dine in Treatment: 12 Clinic  Level of Care Assessment Items TOOL 4 Quantity Score []  - Use when only an EandM is performed on FOLLOW-UP visit 0 ASSESSMENTS - Nursing Assessment / Reassessment X - Reassessment of Co-morbidities (includes updates in patient status) 1 10 X- 1 5 Reassessment of Adherence to Treatment Plan ASSESSMENTS - Wound and Skin Assessment / Reassessment X - Simple Wound Assessment / Reassessment - one wound 1 5 []  - 0 Complex Wound Assessment / Reassessment - multiple wounds []  - 0 Dermatologic / Skin Assessment (not related to wound area) ASSESSMENTS - Focused Assessment []  - Circumferential Edema Measurements - multi extremities 0 []  - 0 Nutritional Assessment / Counseling / Intervention []  - 0 Lower Extremity Assessment (monofilament, tuning fork, pulses) []  - 0 Peripheral Arterial Disease Assessment (using hand held doppler) ASSESSMENTS - Ostomy and/or Continence Assessment and Care []  - Incontinence Assessment and Management 0 []  - 0 Ostomy Care Assessment and Management (repouching, etc.) PROCESS - Coordination of Care X - Simple Patient / Family Education for ongoing care 1 15 []  - 0 Complex (extensive) Patient / Family Education for ongoing care []  - 0 Staff obtains Programmer, systems, Records, Test Results / Process Orders []  - 0 Staff telephones HHA, Nursing Homes / Clarify orders / etc []  - 0 Routine Transfer to another Facility (non-emergent condition) []  - 0 Routine Hospital Admission (non-emergent condition) []  - 0 New Admissions / Biomedical engineer / Ordering NPWT, Apligraf, etc. []  - 0 Emergency Hospital Admission (emergent condition) X- 1 10 Simple Discharge Coordination []  - 0 Complex (extensive) Discharge Coordination PROCESS - Special Needs []  - Pediatric / Minor Patient Management 0 []  - 0 Isolation Patient Management []  - 0 Hearing / Language / Visual special needs []  - 0 Assessment of Community assistance (transportation, D/C planning, etc.) []  -  0 Additional assistance / Altered mentation []  - 0 Support Surface(s) Assessment (bed, cushion, seat, etc.) INTERVENTIONS - Wound Cleansing / Measurement [name], [name] ([id number]) []  - 0 Simple Wound  Cleansing - one wound []  - 0 Complex Wound Cleansing - multiple wounds X- 1 5 Wound Imaging (photographs - any number of wounds) []  - 0 Wound Tracing (instead of photographs) []  - 0 Simple Wound Measurement - one wound []  - 0 Complex Wound Measurement - multiple wounds INTERVENTIONS - Wound Dressings []  - Small Wound Dressing one or multiple wounds 0 []  - 0 Medium Wound Dressing one or multiple wounds []  - 0 Large Wound Dressing one or multiple wounds []  - 0 Application of Medications - topical []  - 0 Application of Medications - injection INTERVENTIONS - Miscellaneous []  - External ear exam 0 []  - 0 Specimen Collection (cultures, biopsies, blood, body fluids, etc.) []  - 0 Specimen(s) / Culture(s) sent or taken to Lab for analysis []  - 0 Patient Transfer (multiple staff / Civil Service fast streamer / Similar devices) []  - 0 Simple Staple / Suture removal (25 or less) []  - 0 Complex Staple / Suture removal (26 or more) []  - 0 Hypo / Hyperglycemic Management (close monitor of Blood Glucose) []  - 0 Ankle / Brachial Index (ABI) - do not check if billed separately X- 1 5 Vital Signs Has the patient been seen at the hospital within the last three years: Yes Total Score: 55 Level Of Care: New/Established - Level 2 Electronic Signature(s) Signed: 04/21/2020 6:00:23 PM By: Gretta Cool, BSN, RN, [name], Kim RN, BSN Entered By: Gretta Cool, BSN, RN, [name], Kim on 04/21/2020 15:51:22 [name] ([id number]) -------------------------------------------------------------------------------- Encounter Discharge Information Details [name]. Date of Service: 04/21/2020 3:15 PM Medical Record Number: [id number] Patient Account Number: [id number] Date of Birth/Sex: 11/26/1925 (86  y.o. F) Treating RN: Cornell Barman Primary Care Carely Nappier: Derinda Late Other Clinician: Referring Thula Stewart: BABAOFF, MARCUS Treating Sophi Calligan/Extender: Tito Dine in Treatment: 12 Encounter Discharge Information Items Discharge Condition: Stable Ambulatory Status: Wheelchair Discharge Destination: Home Transportation: Private Auto Accompanied By: daughter in law Schedule Follow-up Appointment: No Clinical Summary of Care: Electronic Signature(s) Signed: 04/21/2020 5:29:17 PM By: Gretta Cool, BSN, RN, [name], Kim RN, BSN Entered By: Gretta Cool, BSN, RN, [name], Kim on 04/21/2020 17:29:16 [name] ([id number]) -------------------------------------------------------------------------------- General Visit Notes Details [name] Date of Service: 04/21/2020 3:15 PM Medical Record Number: [id number] Patient Account Number: [id number] Date of Birth/Sex: 1926-08-06 (86 y.o. F) Treating RN: Cornell Barman Primary Care Hedwig Mcfall: Derinda Late Other Clinician: Referring Jakory Matsuo: BABAOFF, MARCUS Treating Terrall Bley/Extender: Tito Dine in Treatment: 12 Notes Patient came in today with her right leg wound healed, but in a soft cast. Per daughter in law, patient had a fall while they were out of town on vacation. Upon returning from vacation she was seen by Dr. Sabra Heck at [facility]. She was placed in a soft cast. Patient came in today for clearance from Dr. Dellia Nims to put patient in a permanent cast. Patient was discharged from our care today. Electronic Signature(s) Signed: 04/21/2020 5:25:42 PM By: Gretta Cool, BSN, RN, [name], Kim RN, BSN Entered By: Gretta Cool, BSN, RN, [name], Kim on 04/21/2020 17:25:42 [name] ([id number]) -------------------------------------------------------------------------------- Lower Extremity Assessment Details [name], [name]. Date of Service: 04/21/2020 3:15 PM Medical Record Number: [id number] Patient Account  Number: [id number] Date of Birth/Sex: [birth year] (86 y.o. F) Treating RN: Army Melia Primary Care Ibrahem Volkman: Derinda Late Other Clinician: Referring Jaycub Noorani: BABAOFF, MARCUS Treating Abeni Finchum/Extender: Ricard Dillon Weeks in Treatment: 12 Edema Assessment Assessed: [Left: No] [Right: No] Edema: [Left: N] [Right: o] Vascular Assessment Pulses: Dorsalis Pedis Palpable: [Right:Yes] Electronic Signature(s)  Signed: 04/21/2020 4:01:55 PM By: Army Melia Entered By: Army Melia on 04/21/2020 15:36:04 Takayama, [name] ([id number]) -------------------------------------------------------------------------------- Multi Wound Chart Details [name]. Date of Service: 04/21/2020 3:15 PM Medical Record Number: [id number] Patient Account Number: [id number] Date of Birth/Sex: 25-Sep-1926 (86 y.o. F) Treating RN: Cornell Barman Primary Care Elan Brainerd: Derinda Late Other Clinician: Referring Angenette Daily: BABAOFF, MARCUS Treating Jeniyah Menor/Extender: Tito Dine in Treatment: 12 Vital Signs Height(in): 62 Pulse(bpm): 87 Weight(lbs): 98 Blood Pressure(mmHg): 141/64 Body Mass Index(BMI): 18 Temperature(F): 97.9 Respiratory Rate(breaths/min): 16 Photos: [N/A:N/A] Wound Location: Right, Lateral Lower Leg N/A N/A Wounding Event: Trauma N/A N/A Primary Etiology: Venous Leg Ulcer N/A N/A Secondary Etiology: Trauma, Other N/A N/A Comorbid History: Cataracts, Deep Vein Thrombosis, N/A N/A Hypertension, Osteoarthritis, Dementia Date Acquired: 01/20/2020 N/A N/A Weeks of Treatment: 12 N/A N/A Wound Status: Healed - Epithelialized N/A N/A Measurements L x W x D (cm) 0x0x0 N/A N/A Area (cm) : 0 N/A N/A Volume (cm) : 0 N/A N/A % Reduction in Area: 100.00% N/A N/A % Reduction in Volume: 100.00% N/A N/A Classification: Full Thickness Without Exposed N/A N/A Support Structures Exudate Amount: None Present N/A N/A Wound Margin: Flat and Intact N/A  N/A Granulation Amount: None Present (0%) N/A N/A Necrotic Amount: Large (67-100%) N/A N/A Necrotic Tissue: Eschar, Adherent Slough N/A N/A Exposed Structures: Fascia: No N/A N/A Fat Layer (Subcutaneous Tissue) Exposed: No Tendon: No Muscle: No Joint: No Bone: No Epithelialization: Large (67-100%) N/A N/A Treatment Notes Electronic Signature(s) Signed: 04/27/2020 7:31:01 AM By: Linton Ham MD Entered By: Linton Ham on 04/21/2020 16:04:56 [name] ([id number]) -------------------------------------------------------------------------------- Multi-Disciplinary Care Plan Details [name]. Date of Service: 04/21/2020 3:15 PM Medical Record Number: [id number] Patient Account Number: [id number] Date of Birth/Sex: 1926-08-16 (86 y.o. F) Treating RN: Cornell Barman Primary Care Summit Borchardt: Derinda Late Other Clinician: Referring Houa Ackert: Derinda Late Treating Tonnya Garbett/Extender: Tito Dine in Treatment: 12 Active Inactive Electronic Signature(s) Signed: 04/21/2020 6:04:56 PM By: Gretta Cool, BSN, RN, [name], Kim RN, BSN Previous Signature: 04/21/2020 5:26:00 PM Version By: Gretta Cool, BSN, RN, [name], Kim RN, BSN Entered By: Gretta Cool, BSN, RN, [name], Kim on 04/21/2020 18:04:56 [name] ([id number]) -------------------------------------------------------------------------------- Pain Assessment Details [name], [name]. Date of Service: 04/21/2020 3:15 PM Medical Record Number: [id number] Patient Account Number: [id number] Date of Birth/Sex: 07/31/1926 (86 y.o. F) Treating RN: Army Melia Primary Care Connelly Spruell: Derinda Late Other Clinician: Referring Sunshine Mackowski: BABAOFF, MARCUS Treating Brynlyn Dade/Extender: Ricard Dillon Weeks in Treatment: 12 Active Problems Location of Pain Severity and Description of Pain Patient Has Paino Yes Site Locations Pain Location: Pain in Ulcers Rate the pain. Current Pain Level: 10 Pain  Management and Medication Current Pain Management: Electronic Signature(s) Signed: 04/21/2020 4:01:55 PM By: Army Melia Entered By: Army Melia on 04/21/2020 15:35:54 Ladouceur, [name] ([id number]) -------------------------------------------------------------------------------- Wound Assessment Details Patient Name: Brittany Lolling C. Date of Service: 04/21/2020 3:15 PM Medical Record Number: [id number] Patient Account Number: [id number] Date of Birth/Sex: [birth year] (86 y.o. F) Treating RN: Cornell Barman Primary Care Jnyah Brazee: Derinda Late Other Clinician: Referring Tristram Milian: BABAOFF, MARCUS Treating Katherinne Mofield/Extender: Ricard Dillon Weeks in Treatment: 12 Wound Status Wound Number: 3 Primary Venous Leg Ulcer Etiology: Wound Location: Right, Lateral Lower Leg Secondary Trauma, Other Wounding Event: Trauma Etiology: Date Acquired: 01/20/2020 Wound Status: Healed - Epithelialized Weeks Of Treatment: 12 Comorbid Cataracts, Deep Vein Thrombosis, Hypertension, Clustered Wound: No History: Osteoarthritis, Dementia Photos Wound Measurements Length: (cm) 0 Width: (cm) 0 Depth: (cm) 0 Area: (  cm) 0 Volume: (cm) 0 % Reduction in Area: 100% % Reduction in Volume: 100% Epithelialization: Large (67-100%) Tunneling: No Undermining: No Wound Description Classification: Full Thickness Without Exposed Support Structure Wound Margin: Flat and Intact Exudate Amount: None Present s Foul Odor After Cleansing: No Slough/Fibrino Yes Wound Bed Granulation Amount: None Present (0%) Exposed Structure Necrotic Amount: Large (67-100%) Fascia Exposed: No Necrotic Quality: Eschar, Adherent Slough Fat Layer (Subcutaneous Tissue) Exposed: No Tendon Exposed: No Muscle Exposed: No Joint Exposed: No Bone Exposed: No Electronic Signature(s) Signed: 04/21/2020 6:00:23 PM By: Gretta Cool, BSN, RN, [name], Kim RN, BSN Entered By: Gretta Cool, BSN, RN, [name], Kim on 04/21/2020 15:50:09 [name]  ([id number]) -------------------------------------------------------------------------------- Vitals Details [name] Date of Service: 04/21/2020 3:15 PM Medical Record Number: [id number] Patient Account Number: [id number] Date of Birth/Sex: 07/18/1926 (86 y.o. F) Treating RN: Cornell Barman Primary Care Mayumi Summerson: BABAOFF, MARCUS Other Clinician: Referring Latreece Mochizuki: BABAOFF, MARCUS Treating Ericka Marcellus/Extender: Tito Dine in Treatment: 12 Vital Signs Time Taken: 15:20 Temperature (F): 97.9 Height (in): 62 Pulse (bpm): 87 Weight (lbs): 98 Respiratory Rate (breaths/min): 16 Body Mass Index (BMI): 17.9 Blood Pressure (mmHg): 141/64 Reference Range: 80 - 120 mg / dl Electronic Signature(s) Signed: 04/21/2020 4:40:20 PM By: Lorine Bears RCP, RRT, CHT Entered By: Lorine Bears on 04/21/2020 15:22:44

## 2020-04-27 NOTE — Progress Notes (Signed)
[name], [name] ([id number]) Visit Report for 04/21/2020 HPI Details [name], [name]. Date of Service: 04/21/2020 3:15 PM Medical Record Number: [id number] Patient Account Number: [id number] Date of Birth/Sex: [birth year] (86 y.o. F) Treating RN: Cornell Barman Primary Care Provider: Derinda Late Other Clinician: Referring Provider: BABAOFF, MARCUS Treating Provider/Extender: Tito Dine in Treatment: 12 History of Present Illness HPI Description: 06/06/17 on evaluation today patient presents for evaluation concerning an ulcer which she initially had arise as a result of striking her left lower extremity money bed frame. With that being said this was roughly one month ago and unfortunately the wound despite two courses of Keflex have not really made a dramatic improvement. This has continued to drain as well as being exquisitely tender at times. Even to the point that she is having a difficult time walking. Patient does have chronic atrial fibrillation which subsequently leads to her being on long-term anticoagulant therapy and this causes ED bruising which may have contributed to this entry and where things are at this point. She does have valvular heart disease as well as hypertension and her last INR was 2.7. Currently Neosporin, Gauls, and an ace wrap has been utilized. Keflex is the antibiotic that she has been on. Patient has not had a culture up to this point and she states that her blood pressure at the primary care office yesterday was 138/72. Her pain is significant related to be an eight out of 10. No fevers, chills, nausea, or vomiting noted at this time. 06/20/17; patient was admitted here 2 weeks ago. She had a traumatic wound on her left lateral lower leg probably in the setting of some degree of venous insufficiency with surrounding cellulitis. She had been on Keflex, after we saw her she was put on doxycycline which she apparently did not tolerate.  Culture that was done at the time showed Morganella which was resistant to Keflex. Apparently the patient continued to take Keflex after the appointment. She is also on Coumadin. I had tried to change her antibiotic when the Morganella culture was brought to my attention however apparently our staff could not reach the patient to inform them of the antibiotic change 06/26/17; patient has wound on her left lateral lower extremity in the setting of some degree of venous insufficiency. I gave her cefdinir last week and she is completing this. There is no evidence of surrounding infection. Aggressive debridement last week, wound bed looks healthier. We've been using Aquacel. They're traveling in the [place] next week we'll see her back in 2 weeks unless there are problems. 07/10/17; the patient is been using Aquacel Ag her husband is changing this with Kerlix and conform. She still complains of a lot of pain. Apparently with the antibiotics. We gave 2 or 3 weeks ago. Her INR went up to 6, this was managed by her primary physician 07/17/17; she is using Aquacel Ag and a border foam. Still discomfort although dimensions are better. 07/24/17; patient or for review of a trauma wound on her left anterior leg in the setting of some degree of chronic venous insufficiency she has been using Aquacel Ag and a border foam and making progress. 07/31/17; only a small open area of this original significant trauma is still open. Her husband is changing this every second day [Aquacel Ag] Readmission: 01/14/19 on evaluation today patient is that she seen for initial inspection during the office visit today concerning a trauma/skin tear to the left anterior lower extremity. This is roughly  the same region where she was previously treated and years past when she was here in the clinic. Nonetheless upon evaluation today the patient's wound actually showed signs of decent granulation around the edge although unfortunately  the skin flap that was torn back did fold under on itself and the flap itself is dying. She does have some eschar surrounded the edges of the wound at this point. Nonetheless this also was very tender for her. The patient does have a history of hypertension, atrial fibrillation, and long-term use anticoagulant therapy. 01/21/19 on evaluation today patient's wound on the anterior portion of her lower extremity appears to still be necrotic as far as the surface of the wound is concerned. Unfortunately there is no significant improvement at this point compared to last week they were not able to get the Santyl that are using Medihoney and maybe one reason why. The other is I think this may be stained to drive. Possibly adding something such as mepitel over top of the Medihoney may help to loosen this up quite a bit which I think would be in her best interest. Also she still has a lot of erythema and is very tender to touch I'm afraid that we may need to switch to a different antibiotic to see if this will be of benefit there still really nothing that I can culture to get a better picture of what's going on and what we need to do from the standpoint of antibiotics. I do believe the patient needs a referral Talmuds me to vascular in order to evaluate her blood flow based on what I'm seeing they will be able to perform TBI testing if nothing else along with the vascular evaluation to see if there's any evidence for limited blood flow to this lower extremity. 4/8; I have reviewed the patient's arterial studies and they are really quite good. There should be no arterial impediments to healing the wound on the right anterior tibial area. 4/15; patient has a small open area over the left mid tibial area. Still requiring debridement we are using Santyl here and Medihoney at home [Santyl unaffordable] 4/22; difficult, presumably traumatic area over the left mid tibial area in the setting of chronic venous  insufficiency. We have been using Santyl here and meta honey at home. The wound surface is getting gradually better and slight improvement in dimensions. Her husband is changing this daily 4/29; left mid tibia presumably traumatic wound in the setting of chronic venous insufficiency. We have been using Santyl and a combination of Medihoney at home. Arrives today with a much better looking healthy granulated surface. We will change the primary dressing to [facility] 5/6; left mid tibia smaller reasonably healthy looking wound using Hydrofera Blue since last week 5/20-Patient returns to clinic with a left mid tibial wound looking better, we have been using Hydrofera Blue since the previous week 5/27; patient returns to clinic with a left mid tibial wound covered in raised eschar. We have been using Hydrofera Blue her husband changes the dressings using a border foam cover READMISSION 01/28/20 [name] is a patient who is now 86 years old. As usual she comes in with her husband who is her primary caregiver. They were here last from March through May 2020 with a traumatic wound on the left anterior tibia we eventually heal this out. She has chronic venous insufficiency. She is also here previously in 2018. [name], [name] ([id number]) Her current problem started 8 days ago. She went in  the hospital for a thoracic level kyphoplasty. The procedure she developed a skin tear on her leg. This was mentioned to her. Her husband is applying Medihoney to the area and they are in for our review of this. She finds this much too uncomfortable to attempt ABIs although her ABIs when she was in the clinic last year were quite normal bilaterally Past medical history she is not a diabetic. As noted she had a T9 and T6 kyphoplasty, Alzheimer's disease, chronic atrial fibrillation on Coumadin and hypertension 4/14; patient arrives in clinic having removed part of the wrap. We attempted to leave this on all  week. Unfortunately none that none of the tissue that looks semiviable last week has remained viable. She required an extensive debridement. We have been using silver alginate 4/21; the patient arrived today with tremendous erythema around the large part of the wound on the right lateral leg. This extends medially and down towards her foot. I marked the area but I think the extent of the likely cellulitis is beyond what can be safely managed as an outpatient. 02/23/2020 upon evaluation today patient is seen today though she is typically followed by Dr. Dellia Nims. Last time he saw her he sent her to the hospital and she subsequently was in the hospital for some time. In fact it was from 02/11/2020 through 02/19/2020. During that time she was on antibiotic therapy by way of IV antibiotics. Her leg does appear to be doing better today compared to what we saw then. Nonetheless she had a culture that was obtained while she was in the hospital by Dr. Steva Ready. This ended up not growing anything which is not really surprising considering she was on antibiotics which were fairly strong at the time of culture. There was no growth at all after 2 days. With that being said Dr. Steva Ready apparently discussed with Dr. Dellia Nims per her note anyway that she wanted the patient off of the antibiotics for a week and then to repeat a culture in the outpatient setting. She also has a suspicion for the possibility of pyoderma and has referred the patient to Dr. Nehemiah Massed and the patient is supposed to be seeing him sometime I believe Thursday of this week. Nonetheless I explained that pyoderma can sometimes begin as a injury or insult that then worsens. Obviously the initial injury here appears to have been a scraping of her leg that occurred when the patient was being transferred off of the table after a kyphoplasty procedure. Fortunately again as mentioned the infection seems to be doing significantly better based on what I am  seeing at this point compared to previous pictures that I reviewed today. 5/5; the patient is back to my clinic today after seeing with stone 2 days ago. She is on [employer] with a Kerlix Coban I have reviewed her recent history. We had sent her to the hospital with presumed cellulitis considerable erythema in the lateral leg. She received prolonged courses of antibiotics in the hospital. I received a call from one of the hospitalist to report that he didn't feel that she was responding to antibiotics although as noted 2 days ago by Jeri Cos the wound actually looks a lot better along with the periwound skin which is not nearly as erythematous. He raised the possibility of pyoderma gangrenosum which also had been brought up by Dr. Steva Ready the infectious disease consultant. The discharge culture was negative but she was still on antibiotics The pyoderma gangrenosum apparently has something to do  with the fact that both her sons have Crohn's disease although the patient does not have Crohn's disease. I told the doctor that call me I didn't really think this fit with the history which was a trauma induced wound why the patient was having a kyphoplasty. In fact her husband is still very angry about this with the hospital. She also has severe chronic venous insufficiency with hemosiderin and stasis dermatitis and has had wounds in both legs. 5/12; venous insufficiency wound on the right lower lateral lower leg initially trauma. Still debris on the surface of this wound that undergoes a difficult debridement. We have been using Hydrofera Blue 5/19; venous insufficiency wound on the right lower lateral leg initially trauma. Better looking wound surface and a rim of epithelialization. Surface area is smaller we have been using Hydrofera Blue under compression 03/17/20-Patient returns at 1 week, the wound is looking stable, dimensions are slightly smaller, continue Hydrofera Blue and Santyl  under compression 6/2; venous insufficiency wound on the right lower leg initially traumatic. She has a considerably better looking wound surface than when I saw this 2 weeks ago. Some hyper granulation. We have been using Santyl with [facility] cover. I no longer think the Santyl is necessary we will just use Hydrofera Blue kerlix Coban compression They are going to be traveling including to the [facility] and then subsequently a family vacation in [place]. Her daughter-in-law stated that they would be able to get her in here in 2 weeks before they go to [place] I think that would be good plan. We will show her how to do the dressing including the Kerlix Coban 6/9; venous insufficiency wound on the right lower leg initially traumatic. Complicated by infection requiring hospitalization. Since then she has done very nicely and the wound has been getting progressively smaller we have been using Hydrofera Blue under Kerlix and Coban 6/16; venous insufficiency wound on the right lower leg initially traumatic. This was complicated by an infection with persistent cellulitis requiring a fairly protracted hospitalization. We are using Hydrofera Blue under Kerlix and Coban. The wound is contracting nicely. 6/30; venous insufficiency wound on the right lateral lower leg which was initially traumatic in the hospital has healed. Unfortunately the patient was vacationing in [place] last week had 2 falls in quick succession and by description of her daughter who is with her today she has a bimalleolar fracture in the right ankle. She is seen in [place]. She has a soft brace with an Ace wrap. The patient has dementia making weightbearing restrictions difficult. Electronic Signature(s) Signed: 04/27/2020 7:31:01 AM By: Linton Ham MD Entered By: Linton Ham on 04/21/2020 16:06:28 [name]  ([id number]) -------------------------------------------------------------------------------- Physical Exam Details Patient Name: MATTY, DEAMER. Date of Service: 04/21/2020 3:15 PM Medical Record Number: [id number] Patient Account Number: [id number] Date of Birth/Sex: [birth year] (86 y.o. F) Treating RN: Cornell Barman Primary Care Provider: Derinda Late Other Clinician: Referring Provider: BABAOFF, MARCUS Treating Provider/Extender: Ricard Dillon Weeks in Treatment: 12 Constitutional Patient is hypertensive.. Pulse regular and within target range for patient.Marland Kitchen Respirations regular, non-labored and within target range.. Temperature is normal and within the target range for the patient.Marland Kitchen appears in no distress. Notes Wound exam; the right anterior lower leg is totally healed. Edema control with her venous insufficiency is good. She has some bruising extending into her dorsal foot no doubt from her ankle fractures. Electronic Signature(s) Signed: 04/27/2020 7:31:01 AM By: Linton Ham MD Entered By: Linton Ham on 04/21/2020 16:07:16  [name], [name] ([id number]) -------------------------------------------------------------------------------- Physician Orders Details [name], [name]. Date of Service: 04/21/2020 3:15 PM Medical Record Number: [id number] Patient Account Number: [id number] Date of Birth/Sex: Feb 08, 1926 (86 y.o. F) Treating RN: Cornell Barman Primary Care Provider: Derinda Late Other Clinician: Referring Provider: BABAOFF, MARCUS Treating Provider/Extender: Tito Dine in Treatment: 12 Verbal / Phone Orders: No Diagnosis [facility] Discharge From [facility] o Discharge from [place] - treatment complete. Electronic Signature(s) Signed: 04/21/2020 5:26:26 PM By: Gretta Cool, BSN, RN, [name], Kim RN, BSN Signed: 04/27/2020 7:31:01 AM By: Linton Ham MD Entered By: Gretta Cool, BSN, RN, [name], Kim on  04/21/2020 17:26:25 [name], [name] ([id number]) -------------------------------------------------------------------------------- Problem List Details [name], [name]. Date of Service: 04/21/2020 3:15 PM Medical Record Number: [id number] Patient Account Number: [id number] Date of Birth/Sex: 03/14/1926 (86 y.o. F) Treating RN: Cornell Barman Primary Care Provider: Derinda Late Other Clinician: Referring Provider: BABAOFF, MARCUS Treating Provider/Extender: Tito Dine in Treatment: 12 Active Problems ICD-10 Encounter Code Description Active Date MDM Diagnosis S81.811D Laceration without foreign body, right lower leg, subsequent encounter 01/28/2020 No Yes L97.812 Non-pressure chronic ulcer of other part of right lower leg with fat layer 01/28/2020 No Yes exposed I87.321 Chronic venous hypertension (idiopathic) with inflammation of right 01/28/2020 No Yes lower extremity Inactive Problems Resolved Problems Electronic Signature(s) Signed: 04/27/2020 7:31:01 AM By: Linton Ham MD Entered By: Linton Ham on 04/21/2020 16:04:38 [name], [name] ([id number]) -------------------------------------------------------------------------------- Progress Note Details [name]. Date of Service: 04/21/2020 3:15 PM Medical Record Number: [id number] Patient Account Number: [id number] Date of Birth/Sex: [birth year] (86 y.o. F) Treating RN: Cornell Barman Primary Care Provider: Derinda Late Other Clinician: Referring Provider: BABAOFF, MARCUS Treating Provider/Extender: Tito Dine in Treatment: 12 Subjective History of Present Illness (HPI) 06/06/17 on evaluation today patient presents for evaluation concerning an ulcer which she initially had arise as a result of striking her left lower extremity money bed frame. With that being said this was roughly one month ago and unfortunately the wound despite two courses of Keflex have not really  made a dramatic improvement. This has continued to drain as well as being exquisitely tender at times. Even to the point that she is having a difficult time walking. Patient does have chronic atrial fibrillation which subsequently leads to her being on long-term anticoagulant therapy and this causes ED bruising which may have contributed to this entry and where things are at this point. She does have valvular heart disease as well as hypertension and her last INR was 2.7. Currently Neosporin, Gauls, and an ace wrap has been utilized. Keflex is the antibiotic that she has been on. Patient has not had a culture up to this point and she states that her blood pressure at the primary care office yesterday was 138/72. Her pain is significant related to be an eight out of 10. No fevers, chills, nausea, or vomiting noted at this time. 06/20/17; patient was admitted here 2 weeks ago. She had a traumatic wound on her left lateral lower leg probably in the setting of some degree of venous insufficiency with surrounding cellulitis. She had been on Keflex, after we saw her she was put on doxycycline which she apparently did not tolerate. Culture that was done at the time showed Morganella which was resistant to Keflex. Apparently the patient continued to take Keflex after the appointment. She is also on Coumadin. I had tried to change her  antibiotic when the Morganella culture was brought to my attention however apparently our staff could not reach the patient to inform them of the antibiotic change 06/26/17; patient has wound on her left lateral lower extremity in the setting of some degree of venous insufficiency. I gave her cefdinir last week and she is completing this. There is no evidence of surrounding infection. Aggressive debridement last week, wound bed looks healthier. We've been using Aquacel. They're traveling in the [place] next week we'll see her back in 2 weeks unless there are problems. 07/10/17; the  patient is been using Aquacel Ag her husband is changing this with Kerlix and conform. She still complains of a lot of pain. Apparently with the antibiotics. We gave 2 or 3 weeks ago. Her INR went up to 6, this was managed by her primary physician 07/17/17; she is using Aquacel Ag and a border foam. Still discomfort although dimensions are better. 07/24/17; patient or for review of a trauma wound on her left anterior leg in the setting of some degree of chronic venous insufficiency she has been using Aquacel Ag and a border foam and making progress. 07/31/17; only a small open area of this original significant trauma is still open. Her husband is changing this every second day [Aquacel Ag] Readmission: 01/14/19 on evaluation today patient is that she seen for initial inspection during the office visit today concerning a trauma/skin tear to the left anterior lower extremity. This is roughly the same region where she was previously treated and years past when she was here in the clinic. Nonetheless upon evaluation today the patient's wound actually showed signs of decent granulation around the edge although unfortunately the skin flap that was torn back did fold under on itself and the flap itself is dying. She does have some eschar surrounded the edges of the wound at this point. Nonetheless this also was very tender for her. The patient does have a history of hypertension, atrial fibrillation, and long-term use anticoagulant therapy. 01/21/19 on evaluation today patient's wound on the anterior portion of her lower extremity appears to still be necrotic as far as the surface of the wound is concerned. Unfortunately there is no significant improvement at this point compared to last week they were not able to get the Santyl that are using Medihoney and maybe one reason why. The other is I think this may be stained to drive. Possibly adding something such as mepitel over top of the Medihoney may help to  loosen this up quite a bit which I think would be in her best interest. Also she still has a lot of erythema and is very tender to touch I'm afraid that we may need to switch to a different antibiotic to see if this will be of benefit there still really nothing that I can culture to get a better picture of what's going on and what we need to do from the standpoint of antibiotics. I do believe the patient needs a referral Talmuds me to vascular in order to evaluate her blood flow based on what I'm seeing they will be able to perform TBI testing if nothing else along with the vascular evaluation to see if there's any evidence for limited blood flow to this lower extremity. 4/8; I have reviewed the patient's arterial studies and they are really quite good. There should be no arterial impediments to healing the wound on the right anterior tibial area. 4/15; patient has a small open area over the left mid tibial  area. Still requiring debridement we are using Santyl here and Medihoney at home [Santyl unaffordable] 4/22; difficult, presumably traumatic area over the left mid tibial area in the setting of chronic venous insufficiency. We have been using Santyl here and meta honey at home. The wound surface is getting gradually better and slight improvement in dimensions. Her husband is changing this daily 4/29; left mid tibia presumably traumatic wound in the setting of chronic venous insufficiency. We have been using Santyl and a combination of Medihoney at home. Arrives today with a much better looking healthy granulated surface. We will change the primary dressing to [facility] 5/6; left mid tibia smaller reasonably healthy looking wound using Hydrofera Blue since last week 5/20-Patient returns to clinic with a left mid tibial wound looking better, we have been using Hydrofera Blue since the previous week 5/27; patient returns to clinic with a left mid tibial wound covered in raised eschar. We have  been using Hydrofera Blue her husband changes the dressings using a border foam cover READMISSION 01/28/20 [name] is a patient who is now 86 years old. As usual she comes in with her husband who is her primary caregiver. They were here last from March through May 2020 with a traumatic wound on the left anterior tibia we eventually heal this out. She has chronic venous insufficiency. She is also here previously in 2018. Her current problem started 8 days ago. She went in the hospital for a thoracic level kyphoplasty. The procedure she developed a skin tear on her leg. This was mentioned to her. Her husband is applying Medihoney to the area and they are in for our review of this. She finds this much too uncomfortable to attempt ABIs although her ABIs when she was in the clinic last year were quite normal bilaterally [name], [name] ([id number]) Past medical history she is not a diabetic. As noted she had a T9 and T6 kyphoplasty, Alzheimer's disease, chronic atrial fibrillation on Coumadin and hypertension 4/14; patient arrives in clinic having removed part of the wrap. We attempted to leave this on all week. Unfortunately none that none of the tissue that looks semiviable last week has remained viable. She required an extensive debridement. We have been using silver alginate 4/21; the patient arrived today with tremendous erythema around the large part of the wound on the right lateral leg. This extends medially and down towards her foot. I marked the area but I think the extent of the likely cellulitis is beyond what can be safely managed as an outpatient. 02/23/2020 upon evaluation today patient is seen today though she is typically followed by Dr. Dellia Nims. Last time he saw her he sent her to the hospital and she subsequently was in the hospital for some time. In fact it was from 02/11/2020 through 02/19/2020. During that time she was on antibiotic therapy by way of IV antibiotics. Her leg does  appear to be doing better today compared to what we saw then. Nonetheless she had a culture that was obtained while she was in the hospital by Dr. Steva Ready. This ended up not growing anything which is not really surprising considering she was on antibiotics which were fairly strong at the time of culture. There was no growth at all after 2 days. With that being said Dr. Steva Ready apparently discussed with Dr. Dellia Nims per her note anyway that she wanted the patient off of the antibiotics for a week and then to repeat a culture in the outpatient setting. She also  has a suspicion for the possibility of pyoderma and has referred the patient to Dr. Nehemiah Massed and the patient is supposed to be seeing him sometime I believe Thursday of this week. Nonetheless I explained that pyoderma can sometimes begin as a injury or insult that then worsens. Obviously the initial injury here appears to have been a scraping of her leg that occurred when the patient was being transferred off of the table after a kyphoplasty procedure. Fortunately again as mentioned the infection seems to be doing significantly better based on what I am seeing at this point compared to previous pictures that I reviewed today. 5/5; the patient is back to my clinic today after seeing with stone 2 days ago. She is on [employer] with a Kerlix Coban I have reviewed her recent history. We had sent her to the hospital with presumed cellulitis considerable erythema in the lateral leg. She received prolonged courses of antibiotics in the hospital. I received a call from one of the hospitalist to report that he didn't feel that she was responding to antibiotics although as noted 2 days ago by Jeri Cos the wound actually looks a lot better along with the periwound skin which is not nearly as erythematous. He raised the possibility of pyoderma gangrenosum which also had been brought up by Dr. Steva Ready the infectious disease consultant. The  discharge culture was negative but she was still on antibiotics The pyoderma gangrenosum apparently has something to do with the fact that both her sons have Crohn's disease although the patient does not have Crohn's disease. I told the doctor that call me I didn't really think this fit with the history which was a trauma induced wound why the patient was having a kyphoplasty. In fact her husband is still very angry about this with the hospital. She also has severe chronic venous insufficiency with hemosiderin and stasis dermatitis and has had wounds in both legs. 5/12; venous insufficiency wound on the right lower lateral lower leg initially trauma. Still debris on the surface of this wound that undergoes a difficult debridement. We have been using Hydrofera Blue 5/19; venous insufficiency wound on the right lower lateral leg initially trauma. Better looking wound surface and a rim of epithelialization. Surface area is smaller we have been using Hydrofera Blue under compression 03/17/20-Patient returns at 1 week, the wound is looking stable, dimensions are slightly smaller, continue Hydrofera Blue and Santyl under compression 6/2; venous insufficiency wound on the right lower leg initially traumatic. She has a considerably better looking wound surface than when I saw this 2 weeks ago. Some hyper granulation. We have been using Santyl with [facility] cover. I no longer think the Santyl is necessary we will just use Hydrofera Blue kerlix Coban compression They are going to be traveling including to the [facility] and then subsequently a family vacation in [place]. Her daughter-in-law stated that they would be able to get her in here in 2 weeks before they go to [place] I think that would be good plan. We will show her how to do the dressing including the Kerlix Coban 6/9; venous insufficiency wound on the right lower leg initially traumatic. Complicated by infection requiring  hospitalization. Since then she has done very nicely and the wound has been getting progressively smaller we have been using Hydrofera Blue under Kerlix and Coban 6/16; venous insufficiency wound on the right lower leg initially traumatic. This was complicated by an infection with persistent cellulitis requiring a fairly protracted hospitalization.  We are using Hydrofera Blue under Kerlix and Coban. The wound is contracting nicely. 6/30; venous insufficiency wound on the right lateral lower leg which was initially traumatic in the hospital has healed. Unfortunately the patient was vacationing in [place] last week had 2 falls in quick succession and by description of her daughter who is with her today she has a bimalleolar fracture in the right ankle. She is seen in [place]. She has a soft brace with an Ace wrap. The patient has dementia making weightbearing restrictions difficult. Objective Constitutional Patient is hypertensive.. Pulse regular and within target range for patient.Marland Kitchen Respirations regular, non-labored and within target range.. Temperature is normal and within the target range for the patient.Marland Kitchen appears in no distress. Vitals Time Taken: 3:20 PM, Height: 62 in, Weight: 98 lbs, BMI: 17.9, Temperature: 97.9 F, Pulse: 87 bpm, Respiratory Rate: 16 breaths/min, Blood Pressure: 141/64 mmHg. General Notes: Wound exam; the right anterior lower leg is totally healed. Edema control with her venous insufficiency is good. She has some bruising extending into her dorsal foot no doubt from her ankle fractures. Integumentary (Hair, Skin) Wound #3 status is Healed - Epithelialized. Original cause of wound was Trauma. The wound is located on the Right,Lateral Lower Leg. The wound measures 0cm length x 0cm width x 0cm depth; 0cm^2 area and 0cm^3 volume. There is no tunneling or undermining noted. There is a none [name], [name] ([id number]) present amount of drainage noted. The wound margin is  flat and intact. There is no granulation within the wound bed. There is a large (67-100%) amount of necrotic tissue within the wound bed including Eschar and Adherent Slough. Assessment Active Problems ICD-10 Laceration without foreign body, right lower leg, subsequent encounter Non-pressure chronic ulcer of other part of right lower leg with fat layer exposed Chronic venous hypertension (idiopathic) with inflammation of right lower extremity Plan Discharge From [facility]: Discharge from [facility] - treatment complete. 1. The patient can be discharged from the wound care center treatment is complete her wound is healed 2. I told the patient's daughter that from my point of view she probably should be casted as soon as possible if indeed [facility] thinks that is the best approach here. I am concerned that she may displace her fractures unless this is immobilized. 3. No matter what they do here this is a risk for falls. Electronic Signature(s) Signed: 04/27/2020 7:31:01 AM By: Linton Ham MD Entered By: Linton Ham on 04/21/2020 16:08:33 [name] ([id number]) -------------------------------------------------------------------------------- SuperBill Details [name] Date of Service: 04/21/2020 Medical Record Number: [id number] Patient Account Number: [id number] Date of Birth/Sex: [birth year] (86 y.o. F) Treating RN: Cornell Barman Primary Care Provider: Derinda Late Other Clinician: Referring Provider: BABAOFF, MARCUS Treating Provider/Extender: Tito Dine in Treatment: 12 Diagnosis Coding ICD-10 Codes Code Description S81.811D Laceration without foreign body, right lower leg, subsequent encounter L97.812 Non-pressure chronic ulcer of other part of right lower leg with fat layer exposed I87.321 Chronic venous hypertension (idiopathic) with inflammation of right lower extremity Facility Procedures CPT4 Code:  63016010 Description: [phone] - WOUND CARE VISIT-LEV 2 EST PT Modifier: Quantity: 1 Physician Procedures CPT4 Code: 5732202 Description: 54270 - WC PHYS LEVEL 2 - EST PT Modifier: Quantity: 1 CPT4 Code: Description: ICD-10 Diagnosis Description S81.811D Laceration without foreign body, right lower leg, subsequent encounter L97.812 Non-pressure chronic ulcer of other part of right lower leg with fat l I87.321 Chronic venous hypertension (idiopathic) with  inflammation of right lo Modifier: ayer  exposed wer extremity Quantity: Electronic Signature(s) Signed: 04/27/2020 7:31:01 AM By: Linton Ham MD Entered By: Linton Ham on 04/21/2020 16:09:00

## 2021-03-03 ENCOUNTER — Other Ambulatory Visit: Payer: Self-pay

## 2021-03-03 ENCOUNTER — Ambulatory Visit (INDEPENDENT_AMBULATORY_CARE_PROVIDER_SITE_OTHER): Payer: Medicare Other | Admitting: Dermatology

## 2021-03-03 DIAGNOSIS — L820 Inflamed seborrheic keratosis: Secondary | ICD-10-CM

## 2021-03-03 DIAGNOSIS — L578 Other skin changes due to chronic exposure to nonionizing radiation: Secondary | ICD-10-CM | POA: Diagnosis not present

## 2021-03-03 NOTE — Progress Notes (Signed)
   New Patient Visit  Subjective  [name] is a 87 y.o. female who presents for the following: growth on neck (L neck, >16m, tried otc skin tag medicine, pt picks at).  Patient accompanied by Daughter-In-Law who contributes to history.  The following portions of the chart were reviewed this encounter and updated as appropriate:   Tobacco  Allergies  Meds  Problems  Med Hx  Surg Hx  Fam Hx     Review of Systems:  No other skin or systemic complaints except as noted in HPI or Assessment and Plan.  Objective  Well appearing patient in no apparent distress; mood and affect are within normal limits.  A focused examination was performed including neck. Relevant physical exam findings are noted in the Assessment and Plan.  Objective  L ant neck x 1: Erythematous keratotic waxy stuck-on papule 0.5cm   Assessment & Plan  Inflamed seborrheic keratosis L ant neck x 1  Epidermal / dermal shaving - L ant neck x 1  Lesion diameter (cm):  0.5 Informed consent: discussed and consent obtained   Timeout: patient name, date of birth, surgical site, and procedure verified   Procedure prep:  Patient was prepped and draped in usual sterile fashion Prep type:  Isopropyl alcohol Anesthesia: the lesion was anesthetized in a standard fashion   Anesthetic:  1% lidocaine w/ epinephrine 1-100,000 buffered w/ 8.4% NaHCO3 Instrument used: scissors   Hemostasis achieved with: pressure, aluminum chloride and electrodesiccation   Outcome: patient tolerated procedure well   Post-procedure details: sterile dressing applied and wound care instructions given   Dressing type: bandage and bacitracin    Actinic Damage - chronic, secondary to cumulative UV radiation exposure/sun exposure over time - diffuse scaly erythematous macules with underlying dyspigmentation - Recommend daily broad spectrum sunscreen SPF 30+ to sun-exposed areas, reapply every 2 hours as needed.  - Recommend staying in the  shade or wearing long sleeves, sun glasses (UVA+UVB protection) and wide brim hats (4-inch brim around the entire circumference of the hat). - Call for new or changing lesions.  Return if symptoms worsen or fail to improve.   I, Othelia Pulling, RMA, am acting as scribe for Sarina Ser, MD .  Documentation: I have reviewed the above documentation for accuracy and completeness, and I agree with the above.  Sarina Ser, MD

## 2021-03-03 NOTE — Patient Instructions (Addendum)
If you have any questions or concerns for your doctor, please call our main line at [phone] and press option 4 to reach your doctor's medical assistant. If no one answers, please leave a voicemail as directed and we will return your call as soon as possible. Messages left after 4 pm will be answered the following business day.   You may also send us a message via MyChart. We typically respond to MyChart messages within 1-2 business days.  For prescription refills, please ask your pharmacy to contact our office. Our fax number is [phone].  If you have an urgent issue when the clinic is closed that cannot wait until the next business day, you can page your doctor at the number below.    Please note that while we do our best to be available for urgent issues outside of office hours, we are not available 24/7.   If you have an urgent issue and are unable to reach us, you may choose to seek medical care at your doctor's office, retail clinic, urgent care center, or emergency room.  If you have a medical emergency, please immediately call 911 or go to the emergency department.  Pager Numbers  - Dr. Kowalski: [phone]  - Dr. Moye: [phone]  - Dr. Stewart: [phone]  In the event of inclement weather, please call our main line at [phone] for an update on the status of any delays or closures.  Dermatology Medication Tips: Please keep the boxes that topical medications come in in order to help keep track of the instructions about where and how to use these. Pharmacies typically print the medication instructions only on the boxes and not directly on the medication tubes.   If your medication is too expensive, please contact our office at [phone] option 4 or send us a message through MyChart.   We are unable to tell what your co-pay for medications will be in advance as this is different depending on your insurance coverage. However, we may be able to find a  substitute medication at lower cost or fill out paperwork to get insurance to cover a needed medication.   If a prior authorization is required to get your medication covered by your insurance company, please allow us 1-2 business days to complete this process.  Drug prices often vary depending on where the prescription is filled and some pharmacies may offer cheaper prices.  The website www.goodrx.com contains coupons for medications through different pharmacies. The prices here do not account for what the cost may be with help from insurance (it may be cheaper with your insurance), but the website can give you the price if you did not use any insurance.  - You can print the associated coupon and take it with your prescription to the pharmacy.  - You may also stop by our office during regular business hours and pick up a GoodRx coupon card.  - If you need your prescription sent electronically to a different pharmacy, notify our office through [facility] MyChart or by phone at [phone] option 4.     Wound Care Instructions  1. Cleanse wound gently with soap and water once a day then pat dry with clean gauze. Apply a thing coat of Petrolatum (petroleum jelly, "Vaseline") over the wound (unless you have an allergy to this). We recommend that you use a new, sterile tube of Vaseline. Do not pick or remove scabs. Do not remove the yellow or white "healing tissue" from the base of the wound.    2. Cover the wound with fresh, clean, nonstick gauze and secure with paper tape. You may use Band-Aids in place of gauze and tape if the would is small enough, but would recommend trimming much of the tape off as there is often too much. Sometimes Band-Aids can irritate the skin.  3. You should call the office for your biopsy report after 1 week if you have not already been contacted.  4. If you experience any problems, such as abnormal amounts of bleeding, swelling, significant bruising, significant pain,  or evidence of infection, please call the office immediately.  5. FOR ADULT SURGERY PATIENTS: If you need something for pain relief you may take 1 extra strength Tylenol (acetaminophen) AND 2 Ibuprofen (200mg each) together every 4 hours as needed for pain. (do not take these if you are allergic to them or if you have a reason you should not take them.) Typically, you may only need pain medication for 1 to 3 days.     

## 2021-03-08 ENCOUNTER — Encounter: Payer: Self-pay | Admitting: Dermatology

## 2021-10-06 ENCOUNTER — Other Ambulatory Visit: Payer: Self-pay

## 2021-10-06 ENCOUNTER — Emergency Department: Payer: Medicare Other

## 2021-10-06 ENCOUNTER — Inpatient Hospital Stay
Admission: EM | Admit: 2021-10-06 | Discharge: 2021-10-12 | DRG: 605 | Disposition: A | Discharge status: Home Health | Payer: Medicare Other | Attending: Internal Medicine | Admitting: Internal Medicine

## 2021-10-06 DIAGNOSIS — F0390 Unspecified dementia without behavioral disturbance: Secondary | ICD-10-CM | POA: Diagnosis present

## 2021-10-06 DIAGNOSIS — E876 Hypokalemia: Secondary | ICD-10-CM | POA: Diagnosis present

## 2021-10-06 DIAGNOSIS — R791 Abnormal coagulation profile: Secondary | ICD-10-CM | POA: Diagnosis present

## 2021-10-06 DIAGNOSIS — I10 Essential (primary) hypertension: Secondary | ICD-10-CM | POA: Diagnosis present

## 2021-10-06 DIAGNOSIS — S5012XA Contusion of left forearm, initial encounter: Secondary | ICD-10-CM | POA: Diagnosis present

## 2021-10-06 DIAGNOSIS — I482 Chronic atrial fibrillation, unspecified: Secondary | ICD-10-CM | POA: Diagnosis present

## 2021-10-06 DIAGNOSIS — R296 Repeated falls: Secondary | ICD-10-CM

## 2021-10-06 DIAGNOSIS — Y92009 Unspecified place in unspecified non-institutional (private) residence as the place of occurrence of the external cause: Secondary | ICD-10-CM

## 2021-10-06 DIAGNOSIS — Z20822 Contact with and (suspected) exposure to covid-19: Secondary | ICD-10-CM | POA: Diagnosis present

## 2021-10-06 DIAGNOSIS — S51812A Laceration without foreign body of left forearm, initial encounter: Secondary | ICD-10-CM | POA: Diagnosis present

## 2021-10-06 DIAGNOSIS — R8271 Bacteriuria: Secondary | ICD-10-CM | POA: Diagnosis present

## 2021-10-06 DIAGNOSIS — B9620 Unspecified Escherichia coli [E. coli] as the cause of diseases classified elsewhere: Secondary | ICD-10-CM | POA: Diagnosis present

## 2021-10-06 DIAGNOSIS — S5012XD Contusion of left forearm, subsequent encounter: Secondary | ICD-10-CM | POA: Diagnosis not present

## 2021-10-06 DIAGNOSIS — Z515 Encounter for palliative care: Secondary | ICD-10-CM | POA: Diagnosis not present

## 2021-10-06 DIAGNOSIS — I480 Paroxysmal atrial fibrillation: Secondary | ICD-10-CM | POA: Diagnosis present

## 2021-10-06 DIAGNOSIS — F05 Delirium due to known physiological condition: Secondary | ICD-10-CM | POA: Diagnosis present

## 2021-10-06 DIAGNOSIS — Z66 Do not resuscitate: Secondary | ICD-10-CM | POA: Diagnosis present

## 2021-10-06 DIAGNOSIS — E871 Hypo-osmolality and hyponatremia: Secondary | ICD-10-CM | POA: Diagnosis not present

## 2021-10-06 DIAGNOSIS — R338 Other retention of urine: Secondary | ICD-10-CM | POA: Diagnosis present

## 2021-10-06 DIAGNOSIS — Z7901 Long term (current) use of anticoagulants: Secondary | ICD-10-CM

## 2021-10-06 DIAGNOSIS — Z9181 History of falling: Secondary | ICD-10-CM

## 2021-10-06 DIAGNOSIS — Z79899 Other long term (current) drug therapy: Secondary | ICD-10-CM | POA: Diagnosis not present

## 2021-10-06 DIAGNOSIS — W010XXA Fall on same level from slipping, tripping and stumbling without subsequent striking against object, initial encounter: Secondary | ICD-10-CM | POA: Diagnosis present

## 2021-10-06 DIAGNOSIS — F03A11 Unspecified dementia, mild, with agitation: Secondary | ICD-10-CM | POA: Diagnosis present

## 2021-10-06 DIAGNOSIS — T148XXA Other injury of unspecified body region, initial encounter: Secondary | ICD-10-CM

## 2021-10-06 LAB — COMPREHENSIVE METABOLIC PANEL
ALT: 12 U/L (ref 0–44)
AST: 23 U/L (ref 15–41)
Albumin: 3.9 g/dL (ref 3.5–5.0)
Alkaline Phosphatase: 80 U/L (ref 38–126)
Anion gap: 10 (ref 5–15)
BUN: 13 mg/dL (ref 8–23)
CO2: 28 mmol/L (ref 22–32)
Calcium: 8.8 mg/dL — ABNORMAL LOW (ref 8.9–10.3)
Chloride: 93 mmol/L — ABNORMAL LOW (ref 98–111)
Creatinine, Ser: 0.64 mg/dL (ref 0.44–1.00)
GFR, Estimated: >60 mL/min (ref >60)
Glucose, Bld: 115 mg/dL — ABNORMAL HIGH (ref 70–99)
Potassium: 2.8 mmol/L — ABNORMAL LOW (ref 3.5–5.1)
Sodium: 131 mmol/L — ABNORMAL LOW (ref 135–145)
Total Bilirubin: 1.2 mg/dL (ref 0.3–1.2)
Total Protein: 7 g/dL (ref 6.5–8.1)

## 2021-10-06 LAB — CBC
HCT: 44 % (ref 36.0–46.0)
Hemoglobin: 14.8 g/dL (ref 12.0–15.0)
MCH: 30 pg (ref 26.0–34.0)
MCHC: 33.6 g/dL (ref 30.0–36.0)
MCV: 89.2 fL (ref 80.0–100.0)
Platelets: 249 10*3/uL (ref 150–400)
RBC: 4.93 MIL/uL (ref 3.87–5.11)
RDW: 13.3 % (ref 11.5–15.5)
WBC: 6.7 10*3/uL (ref 4.0–10.5)
nRBC: 0 % (ref 0.0–0.2)

## 2021-10-06 LAB — CBG MONITORING, ED: Glucose-Capillary: 130 mg/dL — ABNORMAL HIGH (ref 70–99)

## 2021-10-06 LAB — RESP PANEL BY RT-PCR (FLU A&B, COVID) ARPGX2
Influenza A by PCR: NEGATIVE
Influenza B by PCR: NEGATIVE
SARS Coronavirus 2 by RT PCR: NEGATIVE

## 2021-10-06 LAB — PROTIME-INR
INR: 3 — ABNORMAL HIGH (ref 0.8–1.2)
Prothrombin Time: 31.4 seconds — ABNORMAL HIGH (ref 11.4–15.2)

## 2021-10-06 MED ORDER — ACETAMINOPHEN 325 MG PO TABS: 650.0000 mg | ORAL_TABLET | Freq: Four times a day (QID) | ORAL | Status: DC | PRN

## 2021-10-06 MED ORDER — SODIUM CHLORIDE 0.9 % IV SOLN: INTRAVENOUS | Status: DC

## 2021-10-06 MED ORDER — PHYTONADIONE 5 MG PO TABS
5.0000 mg | ORAL_TABLET | Freq: Once | ORAL | Status: AC
Start: 2021-10-06 — End: 2021-10-06
  Administered 2021-10-06: 5 mg via ORAL
  Filled 2021-10-06: qty 1

## 2021-10-06 MED ORDER — POTASSIUM CHLORIDE CRYS ER 20 MEQ PO TBCR
40.0000 meq | EXTENDED_RELEASE_TABLET | Freq: Once | ORAL | Status: AC
  Administered 2021-10-06: 40 meq via ORAL
  Filled 2021-10-06: qty 2

## 2021-10-06 MED ORDER — POTASSIUM CHLORIDE 20 MEQ PO PACK: 40.0000 meq | PACK | Freq: Once | ORAL | Status: DC

## 2021-10-06 MED ORDER — MEMANTINE HCL 5 MG PO TABS
5.0000 mg | ORAL_TABLET | Freq: Two times a day (BID) | ORAL | Status: DC
  Administered 2021-10-06 – 2021-10-12 (×13): 5 mg via ORAL
  Filled 2021-10-06 (×13): qty 1

## 2021-10-06 MED ORDER — AMLODIPINE BESYLATE 5 MG PO TABS
5.0000 mg | ORAL_TABLET | Freq: Every day | ORAL | Status: DC
  Administered 2021-10-06 – 2021-10-07 (×2): 5 mg via ORAL
  Filled 2021-10-06 (×2): qty 1

## 2021-10-06 MED ORDER — ACETAMINOPHEN 650 MG RE SUPP
650.0000 mg | Freq: Four times a day (QID) | RECTAL | Status: DC | PRN
  Filled 2021-10-06: qty 1

## 2021-10-06 MED ORDER — ONDANSETRON HCL 4 MG/2ML IJ SOLN: 4.0000 mg | Freq: Four times a day (QID) | INTRAMUSCULAR | Status: DC | PRN

## 2021-10-06 MED ORDER — SODIUM CHLORIDE 0.9 % IV SOLN: INTRAVENOUS | Status: AC

## 2021-10-06 MED ORDER — DOCUSATE SODIUM 100 MG PO CAPS
100.0000 mg | ORAL_CAPSULE | Freq: Two times a day (BID) | ORAL | Status: DC
  Administered 2021-10-06 – 2021-10-11 (×6): 100 mg via ORAL
  Filled 2021-10-06 (×11): qty 1

## 2021-10-06 MED ORDER — ACETAMINOPHEN 500 MG PO TABS
1000.0000 mg | ORAL_TABLET | Freq: Once | ORAL | Status: AC
  Administered 2021-10-06: 1000 mg via ORAL
  Filled 2021-10-06: qty 2

## 2021-10-06 MED ORDER — ONDANSETRON HCL 4 MG PO TABS: 4.0000 mg | ORAL_TABLET | Freq: Four times a day (QID) | ORAL | Status: DC | PRN

## 2021-10-06 MED ORDER — QUETIAPINE FUMARATE 25 MG PO TABS
50.0000 mg | ORAL_TABLET | Freq: Every day | ORAL | Status: DC
  Administered 2021-10-06 – 2021-10-11 (×6): 50 mg via ORAL
  Filled 2021-10-06 (×6): qty 2

## 2021-10-06 MED ORDER — CLONIDINE HCL 0.1 MG PO TABS
0.1000 mg | ORAL_TABLET | Freq: Once | ORAL | Status: AC
  Administered 2021-10-06: 0.1 mg via ORAL
  Filled 2021-10-06: qty 1

## 2021-10-06 MED ORDER — LOSARTAN POTASSIUM 50 MG PO TABS
50.0000 mg | ORAL_TABLET | Freq: Every day | ORAL | Status: DC
  Administered 2021-10-06 – 2021-10-12 (×7): 50 mg via ORAL
  Filled 2021-10-06 (×7): qty 1

## 2021-10-06 MED ORDER — POTASSIUM CHLORIDE 20 MEQ PO PACK
40.0000 meq | PACK | Freq: Once | ORAL | Status: AC
  Administered 2021-10-06: 40 meq via ORAL
  Filled 2021-10-06: qty 2

## 2021-10-06 NOTE — ED Triage Notes (Addendum)
Pt to ER via [facility] from home. Reports a near syncopal episode when ambulating with her care giver today, she became dizzy and caregiver assisted her to the floor. BP systolics 678'L on ems arrival, reports hx of htn and takes bp meds at night.   On Ems arrival it was noted that patient has edema and swelling present to left forearm to hand. Pt reports taking coumadin, reports hitting her arm on the dressing table on Monday and being seen by her pcp yesterday where a dressing was placed. Pt's arm bruised and purple with swollen fingers. Pt able to move fingers freely but reports pain to entire extremity. Bandage removed by EMS.   Daughter also reports recent Seroquel prescription due to increase agitation.  Rings present to left ring finger removed by MD Paduchowski on arrival. Given to daughter.

## 2021-10-06 NOTE — ED Notes (Signed)
Informed RN bed assigned 

## 2021-10-06 NOTE — Progress Notes (Signed)
Patient was seen in the emergency room and noted to have extensive hematoma on the dorsum of the forearm wrist and fingers.  She did not have evidence of compartment syndrome with passive range of motion being painful but not severe pain.  Recommendation at this time is to keep the arm elevated full consult to follow

## 2021-10-06 NOTE — Progress Notes (Signed)
Pt arrived to room 215 from the ED. Received report from [place], Therapist, sports. See assessment. Will continue to monitor.

## 2021-10-06 NOTE — ED Provider Notes (Signed)
[facility] Emergency Department Provider Note  Time seen: 12:11 PM  I have reviewed the triage vital signs and the nursing notes.   HISTORY  Chief Complaint Near Syncope and Arm Injury   HPI [name] is a 87 y.o. female with a past medical history of hypertension, mild dementia, presents to the emergency department for left arm wound and swelling.  According to the daughter 3 days ago patient had a near fall while getting into bed causing a cut to the patient's left forearm.  Daughter brought the patient to her doctor on Tuesday where the wound was cleaned and dressed.  Daughter states she had not seen the wound again until today they took it off and the wound was much larger and now gaping open and the left forearm was extremely swollen and discolored.  Daughter also states this morning while getting ready patient became dizzy and caregiver had to assist the patient to the floor because she felt she was going to pass out.   Past Medical History:  Diagnosis Date   Hypertension     There are no problems to display for this patient.   History reviewed. No pertinent surgical history.  Prior to Admission medications   Not on File    No Known Allergies  History reviewed. No pertinent family history.  Social History Social History   Tobacco Use   Smoking status: Never   Smokeless tobacco: Never  Substance Use Topics   Alcohol use: Never   Drug use: Never    Review of Systems Constitutional: Negative for fever. Cardiovascular: Negative for chest pain. Respiratory: Negative for shortness of breath. Gastrointestinal: Negative for abdominal pain, vomiting and diarrhea. Genitourinary: Negative for urinary compaints Musculoskeletal: Patient has an approximate 8 cm x 3 cm wound to the left forearm.  Significant swelling to left forearm Skin: Bruising to left forearm and hand Neurological: Negative for headache All other ROS  negative  ____________________________________________   PHYSICAL EXAM:  VITAL SIGNS: ED Triage Vitals  Enc Vitals Group     BP 10/06/21 1152 (!) 207/120     Pulse Rate 10/06/21 1152 97     Resp 10/06/21 1152 17     Temp 10/06/21 1123 97.6 F (36.4 C)     Temp Source 10/06/21 1123 Oral     SpO2 10/06/21 1152 98 %     Weight --      Height --      Head Circumference --      Peak Flow --      Pain Score 10/06/21 1149 9     Pain Loc --      Pain Edu? --      Excl. in [place]? --    Constitutional: Alert and oriented. Well appearing and in no distress. Eyes: Normal exam ENT      Head: Normocephalic and atraumatic.      Mouth/Throat: Mucous membranes are moist. Cardiovascular: Normal rate, regular rhythm.  Respiratory: Normal respiratory effort without tachypnea nor retractions. Breath sounds are clear Gastrointestinal: Soft and nontender. No distention. Musculoskeletal: Patient has significant swelling to the left forearm into the left hand consistent with hematoma.  Skin color changes consistent with hematoma as well.  Patient does have an approximate 8 cm laceration to the left forearm that is now gaping open approximately 3 to 4 cm due to the expansion of the forearm secondary to hematoma.  Patient has intact sensation to all fingers as well as good cap refill. Neurologic:  Normal speech and language. No gross focal neurologic deficits are appreciated. Skin:  Skin is warm.  Ecchymotic consistent with hematoma of the left forearm and hand.  Rings are present on the left ring finger that appear to be compressing the proximal ring finger. Psychiatric: Mood and affect are normal.   ____________________________________________    EKG  EKG viewed and interpreted by myself shows atrial fibrillation at 93 bpm with a narrow QRS, normal axis.  Nonspecific ST changes.  ____________________________________________    RADIOLOGY  X-ray shows swelling but no bony  abnormality  ____________________________________________   INITIAL IMPRESSION / ASSESSMENT AND PLAN / ED COURSE  Pertinent labs & imaging results that were available during my care of the patient were reviewed by me and considered in my medical decision making (see chart for details).   Patient presents emergency department for evaluation after an episode of dizziness this morning as well as significant left forearm and hand swelling and discoloration.  Examination is most consistent with hematoma that appears to be fairly extensive to the left forearm and left hand.  Patient's rings on her ring finger were compressing her ring finger were unable to be removed due to swelling in the fingers secondary to hematoma.  Patient's larger ring had a clasp that was able to be removed, the smaller ring I cut off with a ring cutter and gave to the daughter.  Patient's lab work today shows no significant findings mildly hypokalemic INR of 3.0.  Blood pressure is elevated 207/120.  We will dose 0.1 mg of clonidine.  Dr. Rudene Christians of orthopedics has seen and evaluated the patient in the emergency department.  Given the patient's age and significant swelling/hematoma we will reverse the patient's Coumadin with 5 mg of oral vitamin K.  Patient's potassium is low we will dose oral potassium.  We will admit to the hospital service for further monitoring like compression and elevation.  Patient and daughter agreeable to plan of care.  [name] was evaluated in Emergency Department on 10/06/2021 for the symptoms described in the history of present illness. She was evaluated in the context of the global COVID-19 pandemic, which necessitated consideration that the patient might be at risk for infection with the SARS-CoV-2 virus that causes COVID-19. Institutional protocols and algorithms that pertain to the evaluation of patients at risk for COVID-19 are in a state of rapid change based on information released by  regulatory bodies including the CDC and federal and state organizations. These policies and algorithms were followed during the patient's care in the ED.  ____________________________________________   FINAL CLINICAL IMPRESSION(S) / ED DIAGNOSES  Hematoma Laceration   Harvest Dark, MD 10/06/21 1416

## 2021-10-06 NOTE — ED Notes (Signed)
Patient transported to X-ray 

## 2021-10-06 NOTE — H&P (Addendum)
History and Physical    [name] ZTI:[id number] DOB: [birth year] DOA: 10/06/2021  PCP: Derinda Late, MD   Patient coming from: Home  I have personally briefly reviewed patient's old medical records in [place]  Chief Complaint: Left forearm hematoma and dizziness.  HPI: [name] is a 87 y.o. female with PMH significant for essential hypertension, dementia, atrial fibrillation on Coumadin presented in the ED with complaints of dizziness and progression of left forearm laceration.  History is obtained from daughter since patient is demented.  Daughter reports patient fell on Monday while getting out of bed and has developed small  laceration on left forearm associated with bruise, discoloration, She has followed up with her primary care physician on Tuesday,  it was cleaned and dressing was done.  Patient continued to take her Coumadin dosing and today morning She was feeling very dizzy while standing up and her arm looks more swollen and discolored.  Patient was brought in the ED.  Daughter reports She was recently started on Seroquel for hallucinations and restlessness 2 weeks ago.  Daughter denies any fever, nausea, vomiting, sick contact, recent travel.  ED Course: She was hemodynamically stable except hypertension. HR 97, RR 16, BP 207/120, SPO2 98% on room air, temp 97.6. Labs include sodium 131, potassium 3.8, chloride 93, bicarb 28, glucose 115, BUN 13, creatinine 0.64, calcium 8.8, anion gap 10, alkaline phosphatase 80, albumin 3.9, AST 23, ALT 12, total protein 7.0, total bilirubin 1.2, WBC 6.7, hemoglobin 14.8, hematocrit 44.0, platelet 249, PT 31.4, INR 3.0. X-ray left forearm: No displaced fracture or dislocation is seen in the left forearm. There is marked soft tissue swelling along the dorsal aspect, possibly hematoma.  Review of Systems:  Review of Systems  Constitutional: Negative.   HENT: Negative.    Eyes: Negative.   Respiratory: Negative.     Cardiovascular: Negative.   Gastrointestinal: Negative.   Genitourinary: Negative.   Musculoskeletal:        Left arm hematoma.  Skin: Negative.   Neurological: Negative.   Endo/Heme/Allergies:        S/p fall with laceration left forearm hematoma  Psychiatric/Behavioral:  Positive for hallucinations. The patient is nervous/anxious.    Past Medical History:  Diagnosis Date   Hypertension     History reviewed. No pertinent surgical history.   reports that she has never smoked. She has never used smokeless tobacco. She reports that she does not drink alcohol and does not use drugs.  No Known Allergies  History reviewed. No pertinent family history.  Family history reviewed and not pertinent .  Prior to Admission medications   Medication Sig Start Date End Date Taking? Authorizing Provider  amLODipine (NORVASC) 5 MG tablet Take 5 mg by mouth daily. 06/23/21   [provider]  losartan (COZAAR) 50 MG tablet Take 50 mg by mouth daily. 07/20/21   [provider]  memantine (NAMENDA) 5 MG tablet Take 5 mg by mouth 2 (two) times daily. 05/30/21   [provider]  QUEtiapine (SEROQUEL) 50 MG tablet Take 50 mg by mouth at bedtime. 10/04/21   [provider]  warfarin (COUMADIN) 3 MG tablet Take 3 mg by mouth daily. 09/19/21   [provider]  warfarin (COUMADIN) 6 MG tablet Take 6 mg by mouth daily. 09/19/21   [provider]    Physical Exam: Vitals:   10/06/21 1241 10/06/21 1241 10/06/21 1357 10/06/21 1416  BP: (!) 190/120 (!) 190/120 (!) 140/93 105/67  Pulse:  97 97 80  Resp:  17  16  Temp:      TempSrc:      SpO2:  100% 95% 96%    Constitutional: Appears comfortable, not in any acute distress with significant left forearm bruising noted. Vitals:   10/06/21 1241 10/06/21 1241 10/06/21 1357 10/06/21 1416  BP: (!) 190/120 (!) 190/120 (!) 140/93 105/67  Pulse:  97 97 80  Resp:  17  16  Temp:      TempSrc:      SpO2:  100%  95% 96%   Eyes: PERRL, lids and conjunctivae normal ENMT: Mucous membranes are moist.  Posterior pharynx without exudate.Normal dentition.  Neck: normal, supple, no masses, no thyromegaly Respiratory: Clear to auscultation bilaterally, no crackles, no wheezing, no accessory muscle use.   Cardiovascular: S1-S2 heard, regular rate, irregular rhythm, no murmur Abdomen: Soft, nontender, nondistended, BS+ Musculoskeletal: Left forearm discoloration, tender, swollen covered in dressing. Laceration 4cm noted. Skin: no rashes, lesions, ulcers. No induration Neurologic: Not assessed, Patient is not following commands. Psychiatric:  Alert and oriented x 1. Normal mood.     Labs on Admission: I have personally reviewed following labs and imaging studies  CBC: Recent Labs  Lab 10/06/21 1109  WBC 6.7  HGB 14.8  HCT 44.0  MCV 89.2  PLT 588   Basic Metabolic Panel: Recent Labs  Lab 10/06/21 1109  NA 131*  K 2.8*  CL 93*  CO2 28  GLUCOSE 115*  BUN 13  CREATININE 0.64  CALCIUM 8.8*   GFR: CrCl cannot be calculated (Unknown ideal weight.). Liver Function Tests: Recent Labs  Lab 10/06/21 1109  AST 23  ALT 12  ALKPHOS 80  BILITOT 1.2  PROT 7.0  ALBUMIN 3.9   No results for input(s): LIPASE, AMYLASE in the last 168 hours. No results for input(s): AMMONIA in the last 168 hours. Coagulation Profile: Recent Labs  Lab 10/06/21 1109  INR 3.0*   Cardiac Enzymes: No results for input(s): CKTOTAL, CKMB, CKMBINDEX, TROPONINI in the last 168 hours. BNP (last 3 results) No results for input(s): PROBNP in the last 8760 hours. HbA1C: No results for input(s): HGBA1C in the last 72 hours. CBG: Recent Labs  Lab 10/06/21 1446  GLUCAP 130*   Lipid Profile: No results for input(s): CHOL, HDL, LDLCALC, TRIG, CHOLHDL, LDLDIRECT in the last 72 hours. Thyroid Function Tests: No results for input(s): TSH, T4TOTAL, FREET4, T3FREE, THYROIDAB in the last 72 hours. Anemia Panel: No  results for input(s): VITAMINB12, FOLATE, FERRITIN, TIBC, IRON, RETICCTPCT in the last 72 hours. Urine analysis: No results found for: COLORURINE, APPEARANCEUR, [place], [place], GLUCOSEU, HGBUR, BILIRUBINUR, KETONESUR, PROTEINUR, UROBILINOGEN, NITRITE, LEUKOCYTESUR  Radiological Exams on Admission: DG Forearm Left  Result Date: 10/06/2021 CLINICAL DATA:  Pain and swelling, trauma EXAM: LEFT FOREARM - 2 VIEW COMPARISON:  None. FINDINGS: No displaced fracture or dislocation is seen. There is marked soft tissue swelling along the dorsal aspect. Arterial calcifications are seen in the soft tissues. Osteopenia is seen in bony structures. Degenerative changes are noted in the intercarpal joints and first carpometacarpal joint. IMPRESSION: No displaced fracture or dislocation is seen in the left forearm. There is marked soft tissue swelling along the dorsal aspect, possibly hematoma. Electronically Signed   By: Elmer Picker M.D.   On: 10/06/2021 12:55    EKG: Independently reviewed.  Atrial fibrillation  Assessment/Plan Principal Problem:   Traumatic hematoma of left forearm Active Problems:   Dementia without behavioral disturbance (HCC)   Atrial fibrillation, chronic (  [facility])   HTN (hypertension)   Left forearm hematoma : Patient presented with worsening left forearm hematoma. Patient fell on Monday, has developed a small laceration with bruising. She continued to take her regular Coumadin, has worsening hematoma. X-ray shows soft tissue swelling, no fracture or dislocation. Orthopedic consulted,  recommended conservative management. No need for intervention at this time, Continue elevation, pain control.  Supratherapeutic INR; INR 3.0, presented with worsening hematoma left forearm. Hold Coumadin, received vitamin K for Coumadin reversal. Monitor INR.  Atrial fibrillation: Heart rate controlled, Continue amlodipine. Hold Coumadin since INR is 3.0  Hypertension: Continue  amlodipine and losartan  Dementia Continue Namenda and Seroquel.  Recurrent falls: PT and OT eval. Needs to address anticoagulation going forward with recurrent falls.  Hypokalemia: Replaced, continue to monitor  Hyponatremia: This could be due to dehydration,  decreased p.o. intake Continue IV hydration, recheck sodium.  DVT prophylaxis: SCDs Code Status: DNR Family Communication: Daughter at bed side. Disposition Plan:   Status is: Inpatient  Remains inpatient appropriate because: Admitted for left arm hematoma and supratherapeutic INR requiring vitamin K Ortho consulted for evaluation.   Consults called: [facility] Dr. Rudene Christians Admission status: Inpatient   Shawna Clamp MD [facility]   If 7PM-7AM, please contact night-coverage   10/06/2021, 3:06 PM

## 2021-10-06 NOTE — ED Notes (Signed)
Dressing placed loosely to left arm. Blood and oozing noted to preexisting wound.

## 2021-10-07 DIAGNOSIS — E876 Hypokalemia: Secondary | ICD-10-CM | POA: Diagnosis present

## 2021-10-07 DIAGNOSIS — R296 Repeated falls: Secondary | ICD-10-CM

## 2021-10-07 DIAGNOSIS — R791 Abnormal coagulation profile: Secondary | ICD-10-CM | POA: Diagnosis present

## 2021-10-07 DIAGNOSIS — F0390 Unspecified dementia without behavioral disturbance: Secondary | ICD-10-CM

## 2021-10-07 DIAGNOSIS — I482 Chronic atrial fibrillation, unspecified: Secondary | ICD-10-CM

## 2021-10-07 LAB — MAGNESIUM: Magnesium: 1.8 mg/dL (ref 1.7–2.4)

## 2021-10-07 LAB — COMPREHENSIVE METABOLIC PANEL
ALT: 10 U/L (ref 0–44)
AST: 22 U/L (ref 15–41)
Albumin: 4.1 g/dL (ref 3.5–5.0)
Alkaline Phosphatase: 71 U/L (ref 38–126)
Anion gap: 10 (ref 5–15)
BUN: 15 mg/dL (ref 8–23)
CO2: 24 mmol/L (ref 22–32)
Calcium: 8.8 mg/dL — ABNORMAL LOW (ref 8.9–10.3)
Chloride: 96 mmol/L — ABNORMAL LOW (ref 98–111)
Creatinine, Ser: 0.66 mg/dL (ref 0.44–1.00)
GFR, Estimated: >60 mL/min (ref >60)
Glucose, Bld: 102 mg/dL — ABNORMAL HIGH (ref 70–99)
Potassium: 3.8 mmol/L (ref 3.5–5.1)
Sodium: 130 mmol/L — ABNORMAL LOW (ref 135–145)
Total Bilirubin: 1.5 mg/dL — ABNORMAL HIGH (ref 0.3–1.2)
Total Protein: 7 g/dL (ref 6.5–8.1)

## 2021-10-07 LAB — CBC
HCT: 37.4 % (ref 36.0–46.0)
Hemoglobin: 12.7 g/dL (ref 12.0–15.0)
MCH: 29.5 pg (ref 26.0–34.0)
MCHC: 34 g/dL (ref 30.0–36.0)
MCV: 87 fL (ref 80.0–100.0)
Platelets: 257 10*3/uL (ref 150–400)
RBC: 4.3 MIL/uL (ref 3.87–5.11)
RDW: 13.6 % (ref 11.5–15.5)
WBC: 7.4 10*3/uL (ref 4.0–10.5)
nRBC: 0 % (ref 0.0–0.2)

## 2021-10-07 LAB — URINALYSIS, ROUTINE W REFLEX MICROSCOPIC
Glucose, UA: NEGATIVE mg/dL
Ketones, ur: 15 mg/dL — AB
Leukocytes,Ua: NEGATIVE
Nitrite: NEGATIVE
Protein, ur: 30 mg/dL — AB
Specific Gravity, Urine: 1.015 (ref 1.005–1.030)
pH: 8.5 — ABNORMAL HIGH (ref 5.0–8.0)

## 2021-10-07 LAB — PHOSPHORUS: Phosphorus: 3.3 mg/dL (ref 2.5–4.6)

## 2021-10-07 LAB — PROTIME-INR
INR: 2.1 — ABNORMAL HIGH (ref 0.8–1.2)
Prothrombin Time: 23.5 seconds — ABNORMAL HIGH (ref 11.4–15.2)

## 2021-10-07 MED ORDER — HYDRALAZINE HCL 25 MG PO TABS
25.0000 mg | ORAL_TABLET | Freq: Four times a day (QID) | ORAL | Status: DC | PRN
  Administered 2021-10-07 – 2021-10-12 (×3): 25 mg via ORAL
  Filled 2021-10-07 (×3): qty 1

## 2021-10-07 MED ORDER — HALOPERIDOL LACTATE 5 MG/ML IJ SOLN
2.0000 mg | Freq: Four times a day (QID) | INTRAMUSCULAR | Status: DC | PRN
  Administered 2021-10-10: 18:00:00 2 mg via INTRAVENOUS
  Filled 2021-10-07: qty 1

## 2021-10-07 MED ORDER — AMLODIPINE BESYLATE 5 MG PO TABS
5.0000 mg | ORAL_TABLET | Freq: Once | ORAL | Status: AC
  Administered 2021-10-07: 5 mg via ORAL
  Filled 2021-10-07: qty 1

## 2021-10-07 MED ORDER — ACETAMINOPHEN 650 MG RE SUPP
650.0000 mg | Freq: Four times a day (QID) | RECTAL | Status: DC
  Filled 2021-10-07: qty 1

## 2021-10-07 MED ORDER — AMLODIPINE BESYLATE 10 MG PO TABS
10.0000 mg | ORAL_TABLET | Freq: Every day | ORAL | Status: DC
  Administered 2021-10-08 – 2021-10-12 (×5): 10 mg via ORAL
  Filled 2021-10-07 (×5): qty 1

## 2021-10-07 MED ORDER — POLYETHYLENE GLYCOL 3350 17 G PO PACK
17.0000 g | PACK | Freq: Every day | ORAL | Status: DC
  Administered 2021-10-07 – 2021-10-12 (×5): 17 g via ORAL
  Filled 2021-10-07 (×5): qty 1

## 2021-10-07 MED ORDER — ACETAMINOPHEN 325 MG PO TABS
650.0000 mg | ORAL_TABLET | Freq: Four times a day (QID) | ORAL | Status: DC
  Administered 2021-10-07 – 2021-10-10 (×9): 650 mg via ORAL
  Filled 2021-10-07 (×10): qty 2

## 2021-10-07 MED ORDER — TRAMADOL HCL 50 MG PO TABS
50.0000 mg | ORAL_TABLET | Freq: Four times a day (QID) | ORAL | Status: DC | PRN
  Administered 2021-10-07 – 2021-10-08 (×4): 50 mg via ORAL
  Filled 2021-10-07 (×4): qty 1

## 2021-10-07 NOTE — Assessment & Plan Note (Signed)
Present on admission after fall at home on Monday 12/12, patient had worsening left forearm hematoma.  She reportedly continued taking her Coumadin as usual.  X-ray in the ED showed soft tissue swelling, no fracture or dislocation. --Orthopedics following -- Continue conservative management with elevation and pain control -- Monitor closely for signs of compartment syndrome -- No intervention indicated at this point

## 2021-10-07 NOTE — Hospital Course (Signed)
[name] is a 87 y.o. female with PMH significant for essential hypertension, dementia, atrial fibrillation on Coumadin presented in the ED on 10/06/2021 with complaints of dizziness and progressive swelling and discoloration of the left forearm, along with a laceration sustained when she had a fall back on Monday 12/12.  She continued taking her Coumadin and the arm became more swollen and discolored.  Of note, patient was recently started on Seroquel in the outpatient setting for hallucinations and increased agitation and restlessness.  Unclear if this is progression of her dementia or due to some underlying illness.

## 2021-10-07 NOTE — Progress Notes (Signed)
Dressing of the L arm was done twice this shift, soak with blood. A large laceration, a small abrasion and large blister was noted. Arm was elevated with pillow. MD was notified. Will monitor the pt's arm.

## 2021-10-07 NOTE — Assessment & Plan Note (Addendum)
Resolved with replacement.  Potassium improved from 2.8 >> 3.8.  Monitor and replace as needed

## 2021-10-07 NOTE — Assessment & Plan Note (Addendum)
Presented with INR of 3.0 and having developed large left forearm hematoma after fall at home.  Given vitamin K in the ED. -- Daily INR's -- Stop warfarin

## 2021-10-07 NOTE — Assessment & Plan Note (Signed)
Rate controlled. Stop warfarin.  Would not resume anticoagulation given multiple admissions for complications.  Consider a DOAC in follow up instead of warfarin. Telemetry.

## 2021-10-07 NOTE — Progress Notes (Signed)
Progress Note    [name]   PPI:[id number]  DOB: 06/20/1926  DOA: 10/06/2021     1 Date of Service: 10/07/2021   Brief narrative [name] is a 87 y.o. female with PMH significant for essential hypertension, dementia, atrial fibrillation on Coumadin presented in the ED on 10/06/2021 with complaints of dizziness and progressive swelling and discoloration of the left forearm, along with a laceration sustained when she had a fall back on Monday 12/12.  She continued taking her Coumadin and the arm became more swollen and discolored.  Of note, patient was recently started on Seroquel in the outpatient setting for hallucinations and increased agitation and restlessness.  Unclear if this is progression of her dementia or due to some underlying illness.    Assessment and Plan * Traumatic hematoma of left forearm Present on admission after fall at home on Monday 12/12, patient had worsening left forearm hematoma.  She reportedly continued taking her Coumadin as usual.  X-ray in the ED showed soft tissue swelling, no fracture or dislocation. --Orthopedics following -- Continue conservative management with elevation and pain control -- Monitor closely for signs of compartment syndrome -- No intervention indicated at this point  Hypokalemia Resolved with replacement.  Potassium improved from 2.8 >> 3.8.  Monitor and replace as needed  Supratherapeutic INR Presented with INR of 3.0 and having developed large left forearm hematoma after fall at home.  Given vitamin K in the ED. -- Daily INR's -- Stop warfarin  Atrial fibrillation, chronic (HCC) Rate controlled. Stop warfarin.  Would not resume anticoagulation given multiple admissions for complications.  Consider a DOAC in follow up instead of warfarin. Telemetry.  HTN (hypertension) BP uncontrolled, possibly due to pain. --Continue amlodipine and losartan -- Oral hydralazine as needed -- Increase amlodipine to 10  mg  Dementia without behavioral disturbance ([place]) With recent worsening cognition and onset of hallucinations prior to this admission.  She had recently been started on Seroquel with some noted improvement. She has having some delirium and intermittent agitation here. --Continue Namenda and Seroquel --Low-dose Haldol as needed for agitation -- Delirium precautions -- Check urinalysis for infection given recent acute change in her cognition with new onset hallucinations and increased agitation  Recurrent falls Most recent fall resulting in forearm laceration and underlying hematoma prompting this admission. --Fall precautions -- PT evaluation -- Cconsider discontinuing anticoagulation as risks appear to outweigh the benefits    Subjective:  Patient seen with caregiver at bedside today.  Patient was initially sleeping but woke to voice and gentle sternal rub.  She became quite restless, trying to pull dressing of her left arm (right hand with mitten in place) see.  She did calm down later during the encounter.  Caregiver and daughter-in-law who I spoke with on the phone during encounter report patient's recently developed hallucinations and increased agitation and was started on Seroquel which seemed to help.  She has become more confused here in the hospital since admission however.   Objective Vitals:   10/07/21 0949 10/07/21 1000 10/07/21 1225 10/07/21 1513  BP: (!) 194/106 (!) 177/100 (!) 176/112 118/72  Pulse: 90  99 80  Resp: 20   20  Temp: 98.6 F (37 C)   97.7 F (36.5 C)  TempSrc: Oral   Axillary  SpO2: 96%   94%      Vital signs were reviewed and unremarkable except for: Blood pressure: Significantly elevated this morning, improved with increased antihypertensive    Exam General exam: Sleeping but  woke to voice and gentle sternal rub, became restless and mildly agitated, no acute distress HEENT: atraumatic, clear conjunctiva, anicteric sclera, moist mucus  membranes Respiratory system: CTAB on exam limited by patient speaking, normal respiratory effort. Cardiovascular system: normal S1/S2,  RRR, no pedal edema.   Gastrointestinal system: Abdomen soft and nontender Central nervous system: Exam limited by patient confusion/dementia, grossly nonfocal exam, patient does not follow commands Extremities: Left forearm in gauze dressing with dark ecchymosis both distally and proximally, left forearm is swollen but not tense and distally warm to palpation (image below)      Labs / Other Information My review of labs, imaging, notes and other tests is significant for Sodium 130, hypokalemia resolved, chloride 96, glucose 102, total bili 1.5 (up from 1.2)  .  INR 2.1.  Urinalysis with bacteriuria and pyuria:  Cloudy, small bilirubin, moderate hemoglobin, ketonuria, negative leukocytes and negative nitrite, many bacteria, 6-10 WBCs.     Disposition Plan: Status is: Inpatient  Remains inpatient appropriate because: Severity of illness with forearm hematoma requiring very close monitoring for further complications        Time spent: 35 minutes with greater than 50% spent at bedside and in coordination of care [facility] 10/07/2021, 3:40 PM

## 2021-10-07 NOTE — Assessment & Plan Note (Addendum)
Most recent fall resulting in forearm laceration and underlying hematoma prompting this admission. --Fall precautions -- PT evaluation -- Cconsider discontinuing anticoagulation as risks appear to outweigh the benefits

## 2021-10-07 NOTE — Assessment & Plan Note (Addendum)
BP uncontrolled, possibly due to pain. --Continue amlodipine and losartan -- Oral hydralazine as needed -- Increase amlodipine to 10 mg

## 2021-10-07 NOTE — Consult Note (Signed)
Reason for Consult: Hematoma left arm Referring Physician: Dr. Humberto Seals [name] is an 87 y.o. female.  HPI: Patient is a 87 year old on Coumadin for atrial fibrillation.  About a year ago she had a hematoma in her leg that developed infection.  She bumped her arm and scraped it and has had bleeding with a large amount of swelling to the dorsum of the forearm hand and fingers.  She was seen in the emergency room last night and seen again this morning she is quite confused today.  Past Medical History:  Diagnosis Date   Hypertension     History reviewed. No pertinent surgical history.  History reviewed. No pertinent family history.  Social History:  reports that she has never smoked. She has never used smokeless tobacco. She reports that she does not drink alcohol and does not use drugs.  Allergies: No Known Allergies  Medications: I have reviewed the patient's current medications.  Results for orders placed or performed during the hospital encounter of 10/06/21 (from the past 48 hour(s))  CBC     Status: None   Collection Time: 10/06/21 11:09 AM  Result Value Ref Range   WBC 6.7 4.0 - 10.5 K/uL   RBC 4.93 3.87 - 5.11 MIL/uL   Hemoglobin 14.8 12.0 - 15.0 g/dL   HCT 44.0 36.0 - 46.0 %   MCV 89.2 80.0 - 100.0 fL   MCH 30.0 26.0 - 34.0 pg   MCHC 33.6 30.0 - 36.0 g/dL   RDW 13.3 11.5 - 15.5 %   Platelets 249 150 - 400 K/uL   nRBC 0.0 0.0 - 0.2 %    Comment: Performed at [facility], [address] [place], [place] [address]  Comprehensive metabolic panel     Status: Abnormal   Collection Time: 10/06/21 11:09 AM  Result Value Ref Range   Sodium 131 (L) 135 - 145 mmol/L   Potassium 2.8 (L) 3.5 - 5.1 mmol/L   Chloride 93 (L) 98 - 111 mmol/L   CO2 28 22 - 32 mmol/L   Glucose, Bld 115 (H) 70 - 99 mg/dL    Comment: Glucose reference range applies only to samples taken after fasting for at least 8 hours.   BUN 13 8 - 23 mg/dL   Creatinine, Ser 0.64 0.44 - 1.00 mg/dL    Calcium 8.8 (L) 8.9 - 10.3 mg/dL   Total Protein 7.0 6.5 - 8.1 g/dL   Albumin 3.9 3.5 - 5.0 g/dL   AST 23 15 - 41 U/L   ALT 12 0 - 44 U/L   Alkaline Phosphatase 80 38 - 126 U/L   Total Bilirubin 1.2 0.3 - 1.2 mg/dL   GFR, Estimated >60 >60 mL/min    Comment: (NOTE) Calculated using the CKD-EPI Creatinine Equation (2021)    Anion gap 10 5 - 15    Comment: Performed at [facility], [address] [place], [place] [address]  Protime-INR     Status: Abnormal   Collection Time: 10/06/21 11:09 AM  Result Value Ref Range   Prothrombin Time 31.4 (H) 11.4 - 15.2 seconds   INR 3.0 (H) 0.8 - 1.2    Comment: (NOTE) INR goal varies based on device and disease states. Performed at [facility], [address] [place], [place] [address]   Resp Panel by RT-PCR (Flu A&B, Covid) Nasopharyngeal Swab     Status: None   Collection Time: 10/06/21 11:59 AM   Specimen: Nasopharyngeal Swab; Nasopharyngeal(NP) swabs in vial  transport medium  Result Value Ref Range   SARS Coronavirus 2 by RT PCR NEGATIVE NEGATIVE    Comment: (NOTE) SARS-CoV-2 target nucleic acids are NOT DETECTED.  The SARS-CoV-2 RNA is generally detectable in upper respiratory specimens during the acute phase of infection. The lowest concentration of SARS-CoV-2 viral copies this assay can detect is 138 copies/mL. A negative result does not preclude SARS-Cov-2 infection and should not be used as the sole basis for treatment or other patient management decisions. A negative result may occur with  improper specimen collection/handling, submission of specimen other than nasopharyngeal swab, presence of viral mutation(s) within the areas targeted by this assay, and inadequate number of viral copies(<138 copies/mL). A negative result must be combined with clinical observations, patient history, and epidemiological information. The expected result is Negative.  Fact Sheet for Patients:   EntrepreneurPulse.com.au  Fact Sheet for Healthcare Providers:  IncredibleEmployment.be  This test is no t yet approved or cleared by the [place] FDA and  has been authorized for detection and/or diagnosis of SARS-CoV-2 by FDA under an Emergency Use Authorization (EUA). This EUA will remain  in effect (meaning this test can be used) for the duration of the COVID-19 declaration under Section 564(b)(1) of the Act, 21 U.S.C.section 360bbb-3(b)(1), unless the authorization is terminated  or revoked sooner.       Influenza A by PCR NEGATIVE NEGATIVE   Influenza B by PCR NEGATIVE NEGATIVE    Comment: (NOTE) The Xpert Xpress SARS-CoV-2/FLU/RSV plus assay is intended as an aid in the diagnosis of influenza from Nasopharyngeal swab specimens and should not be used as a sole basis for treatment. Nasal washings and aspirates are unacceptable for Xpert Xpress SARS-CoV-2/FLU/RSV testing.  Fact Sheet for Patients: EntrepreneurPulse.com.au  Fact Sheet for Healthcare Providers: IncredibleEmployment.be  This test is not yet approved or cleared by the [place] FDA and has been authorized for detection and/or diagnosis of SARS-CoV-2 by FDA under an Emergency Use Authorization (EUA). This EUA will remain in effect (meaning this test can be used) for the duration of the COVID-19 declaration under Section 564(b)(1) of the Act, 21 U.S.C. section 360bbb-3(b)(1), unless the authorization is terminated or revoked.  Performed at [facility], [address] [place], [place] [address]   CBG monitoring, ED     Status: Abnormal   Collection Time: 10/06/21  2:46 PM  Result Value Ref Range   Glucose-Capillary 130 (H) 70 - 99 mg/dL    Comment: Glucose reference range applies only to samples taken after fasting for at least 8 hours.  Comprehensive metabolic panel     Status: Abnormal   Collection Time: 10/07/21   5:18 AM  Result Value Ref Range   Sodium 130 (L) 135 - 145 mmol/L   Potassium 3.8 3.5 - 5.1 mmol/L   Chloride 96 (L) 98 - 111 mmol/L   CO2 24 22 - 32 mmol/L   Glucose, Bld 102 (H) 70 - 99 mg/dL    Comment: Glucose reference range applies only to samples taken after fasting for at least 8 hours.   BUN 15 8 - 23 mg/dL   Creatinine, Ser 0.66 0.44 - 1.00 mg/dL   Calcium 8.8 (L) 8.9 - 10.3 mg/dL   Total Protein 7.0 6.5 - 8.1 g/dL   Albumin 4.1 3.5 - 5.0 g/dL   AST 22 15 - 41 U/L   ALT 10 0 - 44 U/L   Alkaline Phosphatase 71 38 - 126 U/L   Total Bilirubin 1.5 (H)  0.3 - 1.2 mg/dL   GFR, Estimated >60 >60 mL/min    Comment: (NOTE) Calculated using the CKD-EPI Creatinine Equation (2021)    Anion gap 10 5 - 15    Comment: Performed at [facility], [address] [place], [place] [address]  CBC     Status: None   Collection Time: 10/07/21  5:18 AM  Result Value Ref Range   WBC 7.4 4.0 - 10.5 K/uL   RBC 4.30 3.87 - 5.11 MIL/uL   Hemoglobin 12.7 12.0 - 15.0 g/dL   HCT 37.4 36.0 - 46.0 %   MCV 87.0 80.0 - 100.0 fL   MCH 29.5 26.0 - 34.0 pg   MCHC 34.0 30.0 - 36.0 g/dL   RDW 13.6 11.5 - 15.5 %   Platelets 257 150 - 400 K/uL   nRBC 0.0 0.0 - 0.2 %    Comment: Performed at [facility], [address], [place], [place] [address]  Magnesium     Status: None   Collection Time: 10/07/21  5:18 AM  Result Value Ref Range   Magnesium 1.8 1.7 - 2.4 mg/dL    Comment: Performed at [facility], [address] [place], [place] [address]  Phosphorus     Status: None   Collection Time: 10/07/21  5:18 AM  Result Value Ref Range   Phosphorus 3.3 2.5 - 4.6 mg/dL    Comment: Performed at [facility], [address] [place], [place] [address]    DG Forearm Left  Result Date: 10/06/2021 CLINICAL DATA:  Pain and swelling, trauma EXAM: LEFT FOREARM - 2 VIEW COMPARISON:  None. FINDINGS: No displaced fracture or dislocation is seen. There is marked soft  tissue swelling along the dorsal aspect. Arterial calcifications are seen in the soft tissues. Osteopenia is seen in bony structures. Degenerative changes are noted in the intercarpal joints and first carpometacarpal joint. IMPRESSION: No displaced fracture or dislocation is seen in the left forearm. There is marked soft tissue swelling along the dorsal aspect, possibly hematoma. Electronically Signed   By: Elmer Picker M.D.   On: 10/06/2021 12:55    Review of Systems Blood pressure (!) 147/117, pulse (!) 102, temperature 97.6 F (36.4 C), temperature source Oral, resp. rate 20, SpO2 (!) 88 %. Physical Exam Her swelling to the fingers is less tense today with minimal pain with passive range of motion.  Dressing intact to the forearm. Assessment/Plan: Extensive hematoma that seems to be somewhat better today.  We will need to follow for possible infection if she starts developing fever and recommend IV antibiotics but hold off at present.  Recommend discontinuing anticoagulation at discharge since this is her second admission for anticoagulation complication.  Hessie Knows 10/07/2021, 7:45 AM

## 2021-10-07 NOTE — Assessment & Plan Note (Addendum)
With recent worsening cognition and onset of hallucinations prior to this admission.  She had recently been started on Seroquel with some noted improvement. She has having some delirium and intermittent agitation here. --Continue Namenda and Seroquel --Low-dose Haldol as needed for agitation -- Delirium precautions -- Check urinalysis for infection given recent acute change in her cognition with new onset hallucinations and increased agitation

## 2021-10-08 DIAGNOSIS — I10 Essential (primary) hypertension: Secondary | ICD-10-CM

## 2021-10-08 DIAGNOSIS — S5012XD Contusion of left forearm, subsequent encounter: Secondary | ICD-10-CM

## 2021-10-08 LAB — BASIC METABOLIC PANEL
Anion gap: 7 (ref 5–15)
BUN: 22 mg/dL (ref 8–23)
CO2: 29 mmol/L (ref 22–32)
Calcium: 8.6 mg/dL — ABNORMAL LOW (ref 8.9–10.3)
Chloride: 99 mmol/L (ref 98–111)
Creatinine, Ser: 0.66 mg/dL (ref 0.44–1.00)
GFR, Estimated: >60 mL/min (ref >60)
Glucose, Bld: 103 mg/dL — ABNORMAL HIGH (ref 70–99)
Potassium: 3.5 mmol/L (ref 3.5–5.1)
Sodium: 135 mmol/L (ref 135–145)

## 2021-10-08 LAB — HEMOGLOBIN AND HEMATOCRIT, BLOOD
HCT: 31.8 % — ABNORMAL LOW (ref 36.0–46.0)
Hemoglobin: 10.8 g/dL — ABNORMAL LOW (ref 12.0–15.0)

## 2021-10-08 LAB — PROTIME-INR
INR: 1.3 — ABNORMAL HIGH (ref 0.8–1.2)
Prothrombin Time: 16.5 seconds — ABNORMAL HIGH (ref 11.4–15.2)

## 2021-10-08 NOTE — Assessment & Plan Note (Addendum)
BP uncontrolled, possibly due to pain. --Continue amlodipine and losartan -- Oral hydralazine as needed -- Increased amlodipine to 10 mg

## 2021-10-08 NOTE — Assessment & Plan Note (Signed)
Present on admission after fall at home on Monday 12/12, patient had worsening left forearm hematoma.  She reportedly continued taking her Coumadin as usual.  X-ray in the ED showed soft tissue swelling, no fracture or dislocation. 12/17: swelling improved, ecchymosis stable, pain improved/controlled --Orthopedics following -- Continue conservative management with elevation, icing and pain control -- Monitor closely for signs of compartment syndrome -- No intervention indicated at this point

## 2021-10-08 NOTE — Assessment & Plan Note (Addendum)
With recent worsening cognition and onset of hallucinations prior to this admission.  She had recently been started on Seroquel with some noted improvement. She has having some mild delirium and intermittent agitation here. --Continue Namenda and Seroquel --Low-dose Haldol as needed for agitation -- Delirium precautions -- UA with bacteruria but not grossly infected -- Check urine culture from prior sample

## 2021-10-08 NOTE — Assessment & Plan Note (Addendum)
Rate controlled. Stop warfarin.  Would not resume anticoagulation given multiple admissions for complications and very high falls risk.  Consider a DOAC in follow up instead of warfarin if anticoagulation is to be continued. Telemetry.

## 2021-10-08 NOTE — Progress Notes (Signed)
Progress Note    [name]   ZMO:[id number]  DOB: 1926-01-07  DOA: 10/06/2021     2 Date of Service: 10/08/2021   Brief narrative [name] is a 87 y.o. female with PMH significant for essential hypertension, dementia, atrial fibrillation on Coumadin presented in the ED on 10/06/2021 with complaints of dizziness and progressive swelling and discoloration of the left forearm, along with a laceration sustained when she had a fall back on Monday 12/12.  She continued taking her Coumadin and the arm became more swollen and discolored.  Of note, patient was recently started on Seroquel in the outpatient setting for hallucinations and increased agitation and restlessness.  Unclear if this is progression of her dementia or due to some underlying illness.    Assessment and Plan * Traumatic hematoma of left forearm Present on admission after fall at home on Monday 12/12, patient had worsening left forearm hematoma.  She reportedly continued taking her Coumadin as usual.  X-ray in the ED showed soft tissue swelling, no fracture or dislocation. 12/17: swelling improved, ecchymosis stable, pain improved/controlled --Orthopedics following -- Continue conservative management with elevation, icing and pain control -- Monitor closely for signs of compartment syndrome -- No intervention indicated at this point  Hypokalemia Resolved with replacement.  Potassium improved from 2.8 >> 3.8>>3.5.  Monitor and replace as needed  Supratherapeutic INR Presented with INR of 3.0 and having developed large left forearm hematoma after fall at home.  Given vitamin K in the ED. 12/17: INR 1.3 -- Daily INR's -- Stop warfarin  Atrial fibrillation, chronic (HCC) Rate controlled. Stop warfarin.  Would not resume anticoagulation given multiple admissions for complications and very high falls risk.  Consider a DOAC in follow up instead of warfarin if anticoagulation is to be continued. Telemetry.  HTN  (hypertension) BP uncontrolled, possibly due to pain. --Continue amlodipine and losartan -- Oral hydralazine as needed -- Increased amlodipine to 10 mg  Dementia without behavioral disturbance ([place]) With recent worsening cognition and onset of hallucinations prior to this admission.  She had recently been started on Seroquel with some noted improvement. She has having some mild delirium and intermittent agitation here. --Continue Namenda and Seroquel --Low-dose Haldol as needed for agitation -- Delirium precautions -- UA with bacteruria but not grossly infected -- Check urine culture from prior sample  Recurrent falls Most recent fall resulting in forearm laceration and underlying hematoma prompting this admission. --Fall precautions -- PT evaluation -- Cconsider discontinuing anticoagulation as risks appear to outweigh the benefits     Subjective:  Patient awake laying in bed when seen today.  Caregiver and son at bedside.  No acute events reported.  Patient seems to be more comfortable with Tylenol and tramadol controlling pain.  Objective Vitals:   10/07/21 1513 10/07/21 2052 10/08/21 0556 10/08/21 0748  BP: 118/72 (!) 145/81 137/82 (!) 148/93  Pulse: 80 95 86 82  Resp: 20 16 16 16   Temp: 97.7 F (36.5 C) 98.3 F (36.8 C) 98 F (36.7 C) (!) 97.5 F (36.4 C)  TempSrc: Axillary Axillary Axillary Rectal  SpO2: 94% 93% 96% 97%      Vital signs were reviewed and unremarkable except for: Hypertension improved, BP with intermittent mild elevation   Exam General exam: awake, alert, no acute distress Respiratory system: CTAB, no wheezes, rales or rhonchi, normal respiratory effort. Cardiovascular system: normal S1/S2, RRR, no pedal edema.   Gastrointestinal system: soft, NT, ND, no HSM felt, +bowel sounds. Central nervous system: Exam limited  by patient's dementia. no gross focal neurologic deficits, minimally verbal but seems to have normal speech Extremities: Right  forearm with clean dry intact gauze dressing, right upper extremity swelling is improved, stable ecchymosis Psychiatry: normal mood, congruent affect, abnormal judgment and insight due to dementia   Labs / Other Information My review of labs, imaging, notes and other tests is significant for Glucose 103, hemoglobin declined (14.8 on admission, 12.7 yesterday, 10.8 today), INR down to 1.3 today     Disposition Plan: Status is: Inpatient  Remains inpatient appropriate because: Warrants further close monitoring given declining hemoglobin.     Time spent: 30 minutes [facility] 10/08/2021, 2:03 PM

## 2021-10-08 NOTE — Evaluation (Signed)
Physical Therapy Evaluation Patient Details Name: [name] MRN: [id number] DOB: June 14, 1926 Today's Date: 10/08/2021  History of Present Illness  Patient is a 87 year old female with PMH  significant for essential hypertension, dementia, atrial fibrillation on Coumadin presented in the ED on 10/06/2021 with complaints of dizziness and progressive swelling and discoloration of the left forearm, along with a laceration sustained when she had a fall back on Monday 12/12.  X-ray in the ED showed soft tissue swelling, no fracture or dislocation.  Clinical Impression  Patient tolerated session poorly. Prior to hospitalization, caregiver and son stated patient was independent with most ADLs, and SBA with ambulation w/ RW. Caregiver states a decline in status within the past week, where patient has required assistance with all ADLs and ambulation within the past week. Patient was mostly non-verbal throughout the session, with patient's dementia limited progress of evaluation. PROM was [facility] in BLE. Due to impaired cognition, MMT, trasnfers, and ambulation was unable to be assessed. Will attempt PT trial to see if patient is appropriate for ongoing PT.      Recommendations for follow up therapy are one component of a multi-disciplinary discharge planning process, led by the attending physician.  Recommendations may be updated based on patient status, additional functional criteria and insurance authorization.  Follow Up Recommendations Skilled nursing-short term rehab (<3 hours/day)    Assistance Recommended at Discharge Frequent or constant Supervision/Assistance  Functional Status Assessment Patient has had a recent decline in their functional status and demonstrates the ability to make significant improvements in function in a reasonable and predictable amount of time.  Equipment Recommendations  None recommended by PT    Recommendations for Other Services       Precautions / Restrictions  Precautions Precautions: None Restrictions Weight Bearing Restrictions: No      Mobility  Bed Mobility                    Transfers                        Ambulation/Gait                  Stairs            Wheelchair Mobility    Modified Rankin (Stroke Patients Only)       Balance                                             Pertinent Vitals/Pain Pain Assessment: 0-10 Pain Score: 0-No pain    Home Living Family/patient expects to be discharged to:: Private residence Living Arrangements: Alone Available Help at Discharge: Available 24 hours/day (caregiver 24/7, Son and daughter and law live 2 doors down) Type of Home: House Home Access: Stairs to enter Entrance Stairs-Rails: Can reach both Entrance Stairs-Number of Steps: 4 Alternate Level Stairs-Number of Steps: patient has chair lift that she utilizes to get upstairs Home Layout: Two level (patient sleeps upstairs) Home Equipment: Rolling Walker (2 wheels);Grab bars - tub/shower;Wheelchair - manual      Prior Function               Mobility Comments: Per caregiver other than the last week, where patient as needed assistance with all mobility and ADLs, patient was ambulating at SBA with RW ADLs Comments: independent with eating, bathroom per caregiver  Hand Dominance   Dominant Hand: Right    Extremity/Trunk Assessment   Upper Extremity Assessment Upper Extremity Assessment: Difficult to assess due to impaired cognition    Lower Extremity Assessment Lower Extremity Assessment: Difficult to assess due to impaired cognition (PROM BLE WFL)       Communication   Communication: Other (comment) (patient is mosly non-verbal)  Cognition Arousal/Alertness: Suspect due to medications Behavior During Therapy: Flat affect Overall Cognitive Status: Impaired/Different from baseline Area of Impairment: Orientation;Memory;Following  commands;Safety/judgement                 Orientation Level: Disoriented to;Person;Place;Time;Situation (would ask A&O questions and patient would nod yes that she knew the answers but would not verbalize answer when asked)   Memory: Decreased short-term memory Following Commands: Follows one step commands inconsistently Safety/Judgement: Decreased awareness of safety;Decreased awareness of deficits     General Comments: Patient dementia seriously limits therapy progress, Max encouragment to talk, family member and care giver do all the talking        General Comments      Exercises     Assessment/Plan    PT Assessment Patient needs continued PT services  PT Problem List Decreased strength;Decreased mobility;Decreased safety awareness;Decreased knowledge of precautions;Decreased balance       PT Treatment Interventions Therapeutic exercise;Balance training;Functional mobility training;Therapeutic activities;Patient/family education;Gait training    PT Goals (Current goals can be found in the Care Plan section)  Acute Rehab PT Goals Patient Stated Goal: patient mostly non-verbal, caregiver and sons goal was to get patient home PT Goal Formulation: With patient/family Time For Goal Achievement: 10/22/21 Potential to Achieve Goals: Poor    Frequency Min 1X/week (PT trial, limited participation in eval)   Barriers to discharge        Co-evaluation               AM-PAC PT "6 Clicks" Mobility  Outcome Measure Help needed turning from your back to your side while in a flat bed without using bedrails?: A Lot Help needed moving from lying on your back to sitting on the side of a flat bed without using bedrails?: A Lot Help needed moving to and from a bed to a chair (including a wheelchair)?: A Lot Help needed standing up from a chair using your arms (e.g., wheelchair or bedside chair)?: A Lot Help needed to walk in hospital room?: A Lot Help needed climbing 3-5  steps with a railing? : Total 6 Click Score: 11    End of Session   Activity Tolerance: Other (comment) (Patient tolerance to session limited due to dementia) Patient left: in bed;with call bell/phone within reach;with bed alarm set;with family/visitor present;with restraints reapplied Nurse Communication: Mobility status PT Visit Diagnosis: Repeated falls (R29.6);Muscle weakness (generalized) (M62.81);Unsteadiness on feet (R26.81);Difficulty in walking, not elsewhere classified (R26.2);Dizziness and giddiness (R42)    Time: 2536-6440 PT Time Calculation (min) (ACUTE ONLY): 21 min   Charges:   PT Evaluation $PT Eval Low Complexity: 1 Low          Iva Boop, PT  10/08/21. 11:56 AM

## 2021-10-08 NOTE — Progress Notes (Signed)
Pt had urinary retention. Bladder scan >337ml. MD was notified, in and out cath ordered. 463ml was removed.  10/08/21 1851  External Urinary Catheter  Placement Date/Time: 10/08/21 0621    Output (mL) 50 mL  Urine Characteristics  Urinary Interventions Bladder scan;Intermittent/Straight cath  Bladder Scan Volume (mL) 395 mL  Intermittent/Straight Cath (mL) 400 mL  Hygiene Peri care

## 2021-10-08 NOTE — Progress Notes (Signed)
Patient is sleeping well this morning.  Safety mitten applied to the left hand.  Patient became agitated upon being awoken but did calm down.  Mitten was removed to examine he left hand, continued swelling but improved when compared to documentation yesterday.  No visible signs of pain with passive flexion and extension of the left hand.  Moderate ecchymosis noted proximal to the dressing.  Dressing intact without any signs of drainage.  Continue to elevate the hand and apply ice to improve swelling.  Raquel Naphtali Riede, PA-C [place]

## 2021-10-08 NOTE — Assessment & Plan Note (Signed)
Most recent fall resulting in forearm laceration and underlying hematoma prompting this admission. --Fall precautions -- PT evaluation -- Cconsider discontinuing anticoagulation as risks appear to outweigh the benefits

## 2021-10-08 NOTE — Assessment & Plan Note (Addendum)
Resolved with replacement.  Potassium improved from 2.8 >> 3.8>>3.5.  Monitor and replace as needed

## 2021-10-08 NOTE — Assessment & Plan Note (Signed)
Presented with INR of 3.0 and having developed large left forearm hematoma after fall at home.  Given vitamin K in the ED. 12/17: INR 1.3 -- Daily INR's -- Stop warfarin

## 2021-10-08 NOTE — TOC CM/SW Note (Signed)
PT was unable to assess patient today for rehab needs. Will follow for assessment and recommendations.  Dayton Scrape, [place]

## 2021-10-09 DIAGNOSIS — R338 Other retention of urine: Secondary | ICD-10-CM

## 2021-10-09 LAB — CBC
HCT: 32.2 % — ABNORMAL LOW (ref 36.0–46.0)
Hemoglobin: 11.1 g/dL — ABNORMAL LOW (ref 12.0–15.0)
MCH: 30.7 pg (ref 26.0–34.0)
MCHC: 34.5 g/dL (ref 30.0–36.0)
MCV: 89.2 fL (ref 80.0–100.0)
Platelets: 239 10*3/uL (ref 150–400)
RBC: 3.61 MIL/uL — ABNORMAL LOW (ref 3.87–5.11)
RDW: 13.6 % (ref 11.5–15.5)
WBC: 5.7 10*3/uL (ref 4.0–10.5)
nRBC: 0 % (ref 0.0–0.2)

## 2021-10-09 LAB — PROTIME-INR
INR: 1.1 (ref 0.8–1.2)
Prothrombin Time: 14.6 seconds (ref 11.4–15.2)

## 2021-10-09 LAB — BASIC METABOLIC PANEL
Anion gap: 8 (ref 5–15)
BUN: 23 mg/dL (ref 8–23)
CO2: 27 mmol/L (ref 22–32)
Calcium: 8.9 mg/dL (ref 8.9–10.3)
Chloride: 97 mmol/L — ABNORMAL LOW (ref 98–111)
Creatinine, Ser: 0.74 mg/dL (ref 0.44–1.00)
GFR, Estimated: >60 mL/min (ref >60)
Glucose, Bld: 88 mg/dL (ref 70–99)
Potassium: 3.9 mmol/L (ref 3.5–5.1)
Sodium: 132 mmol/L — ABNORMAL LOW (ref 135–145)

## 2021-10-09 MED ORDER — SODIUM CHLORIDE 0.9 % IV SOLN: INTRAVENOUS | Status: DC

## 2021-10-09 MED ORDER — SODIUM CHLORIDE 0.9 % IV SOLN
1.0000 g | INTRAVENOUS | Status: AC
  Administered 2021-10-09 – 2021-10-11 (×3): 1 g via INTRAVENOUS
  Filled 2021-10-09: qty 10
  Filled 2021-10-09: qty 1
  Filled 2021-10-09: qty 10

## 2021-10-09 NOTE — Assessment & Plan Note (Signed)
Most recent fall resulting in forearm laceration and underlying hematoma prompting this admission. --Fall precautions -- PT evaluation -- Consider discontinuing anticoagulation as risks appear to outweigh the benefits

## 2021-10-09 NOTE — Assessment & Plan Note (Signed)
Rate controlled. Stop warfarin.  Would not resume anticoagulation given multiple admissions for complications and very high falls risk.  Telemetry. Cardiology consulted for input per family's request.

## 2021-10-09 NOTE — Assessment & Plan Note (Signed)
Present on admission after fall at home on Monday 12/12, patient had worsening left forearm hematoma.  She reportedly continued taking her Coumadin as usual.  X-ray in the ED showed soft tissue swelling, no fracture or dislocation. 12/17-18: swelling improving, ecchymosis stable, pain improved/controlled --Orthopedics following -- Continue conservative management with elevation, icing and pain control -- Monitor closely for signs of compartment syndrome -- No intervention indicated at this point

## 2021-10-09 NOTE — Assessment & Plan Note (Signed)
With recent worsening cognition and onset of hallucinations prior to this admission.  She had recently been started on Seroquel with some noted improvement. She has having some mild delirium and intermittent agitation here. --Continue Namenda and Seroquel --Low-dose Haldol as needed for agitation -- Delirium precautions -- UA with bacteruria but not grossly infected -- Urine culture growing E coli - start Rocephin

## 2021-10-09 NOTE — Assessment & Plan Note (Signed)
Presented with INR of 3.0 and having developed large left forearm hematoma after fall at home.  Given vitamin K in the ED. INR has normalized. -- Stop warfarin

## 2021-10-09 NOTE — Assessment & Plan Note (Signed)
Resolved with replacement.   Monitor and replace as needed

## 2021-10-09 NOTE — Assessment & Plan Note (Signed)
BP uncontrolled, possibly due to pain. --Continue amlodipine and losartan -- Oral hydralazine as needed -- Increased amlodipine to 10 mg

## 2021-10-09 NOTE — Assessment & Plan Note (Signed)
Suspect due to tramadol. --Bladder scans & in/out caths PRN --Stop tramadol (resume only if Tylenol not adequate) --Consider Foley but hold off for now

## 2021-10-09 NOTE — Progress Notes (Signed)
Progress Note    [name]   EHO:[id number]  DOB: 07/21/1926  DOA: 10/06/2021     3 Date of Service: 10/09/2021   Brief Narrative [name] is a 87 y.o. female with PMH significant for essential hypertension, dementia, atrial fibrillation on Coumadin presented in the ED on 10/06/2021 with complaints of dizziness and progressive swelling and discoloration of the left forearm, along with a laceration sustained when she had a fall back on Monday 12/12.  She continued taking her Coumadin and the arm became more swollen and discolored.  Of note, patient was recently started on Seroquel in the outpatient setting for hallucinations and increased agitation and restlessness.  Unclear if this is progression of her dementia or due to some underlying illness.    Assessment and Plan * Traumatic hematoma of left forearm Present on admission after fall at home on Monday 12/12, patient had worsening left forearm hematoma.  She reportedly continued taking her Coumadin as usual.  X-ray in the ED showed soft tissue swelling, no fracture or dislocation. 12/17-18: swelling improving, ecchymosis stable, pain improved/controlled --Orthopedics following -- Continue conservative management with elevation, icing and pain control -- Monitor closely for signs of compartment syndrome -- No intervention indicated at this point  Acute urinary retention Suspect due to tramadol. --Bladder scans & in/out caths PRN --Stop tramadol (resume only if Tylenol not adequate) --Consider Foley but hold off for now  Hypokalemia Resolved with replacement.   Monitor and replace as needed  Supratherapeutic INR Presented with INR of 3.0 and having developed large left forearm hematoma after fall at home.  Given vitamin K in the ED. INR has normalized. -- Stop warfarin  Atrial fibrillation, chronic (HCC) Rate controlled. Stop warfarin.  Would not resume anticoagulation given multiple admissions for complications  and very high falls risk.  Telemetry. Cardiology consulted for input per family's request.  HTN (hypertension) BP uncontrolled, possibly due to pain. --Continue amlodipine and losartan -- Oral hydralazine as needed -- Increased amlodipine to 10 mg  Dementia without behavioral disturbance ([place]) With recent worsening cognition and onset of hallucinations prior to this admission.  She had recently been started on Seroquel with some noted improvement. She has having some mild delirium and intermittent agitation here. --Continue Namenda and Seroquel --Low-dose Haldol as needed for agitation -- Delirium precautions -- UA with bacteruria but not grossly infected -- Urine culture growing E coli - start Rocephin  Recurrent falls Most recent fall resulting in forearm laceration and underlying hematoma prompting this admission. --Fall precautions -- PT evaluation -- Consider discontinuing anticoagulation as risks appear to outweigh the benefits   12/18: Palliative Care consulted. Wound care consulted. HH PT, RN and Aid ordered. Hospital bed ordered per family request.   Subjective:  Pt doing well today. No acute events reported.  Son and caregiver at bedside.  Daughter-in-law on speaker phone during encounter.  Pt having urinary retention.  Pain seems controlled, family agreeable to monitor off tramadol to see if urinary retention improves.    Objective Vitals:   10/08/21 1946 10/09/21 0622 10/09/21 0810 10/09/21 1528  BP:  (!) 155/93 (!) 165/89 (!) 160/84  Pulse:  81 94 90  Resp:  14 14 20   Temp: 98.5 F (36.9 C)  98.2 F (36.8 C) 98.5 F (36.9 C)  TempSrc: Oral  Oral Oral  SpO2:  100% 98% 96%      Vital signs were reviewed and unremarkable except for: BP mildly elevated   Exam General exam: awake, alert, no  acute distress, frail Respiratory system: normal respiratory effort, on room air. Cardiovascular system: normal S1/S2, RRR, no pedal edema.   Central nervous  system: no gross focal neurologic deficits, normal speech but minimally verbal Skin: right forearm ecchymosis is stable, swelling further improved, dressing clean dry and intact,  Psychiatry: normal mood, congruent affect, judgement and insight abnormal due to dementia   Labs / Other Information My review of labs, imaging, notes and other tests is significant for Sodium 132, hemoglobin improved from 10.8 up to 1.1  , INR normalized 1.1, urine culture from initial sample on admission growing E. coli   Disposition Plan: Status is: Inpatient  Remains inpatient appropriate because: Severity of illness requiring IV therapies as outlined above and clearance by orthopedic surgery for forearm hematoma      Time spent: 30 minutes with greater than 50% spent at bedside and in coordination of care  [facility] 10/09/2021, 5:21 PM

## 2021-10-10 DIAGNOSIS — Z66 Do not resuscitate: Secondary | ICD-10-CM

## 2021-10-10 DIAGNOSIS — Z515 Encounter for palliative care: Secondary | ICD-10-CM

## 2021-10-10 DIAGNOSIS — S51812A Laceration without foreign body of left forearm, initial encounter: Secondary | ICD-10-CM

## 2021-10-10 DIAGNOSIS — T148XXA Other injury of unspecified body region, initial encounter: Secondary | ICD-10-CM

## 2021-10-10 LAB — URINE CULTURE: Culture: 60000 — AB

## 2021-10-10 LAB — BASIC METABOLIC PANEL
Anion gap: 6 (ref 5–15)
BUN: 22 mg/dL (ref 8–23)
CO2: 26 mmol/L (ref 22–32)
Calcium: 8.7 mg/dL — ABNORMAL LOW (ref 8.9–10.3)
Chloride: 99 mmol/L (ref 98–111)
Creatinine, Ser: 0.68 mg/dL (ref 0.44–1.00)
GFR, Estimated: >60 mL/min (ref >60)
Glucose, Bld: 109 mg/dL — ABNORMAL HIGH (ref 70–99)
Potassium: 3.9 mmol/L (ref 3.5–5.1)
Sodium: 131 mmol/L — ABNORMAL LOW (ref 135–145)

## 2021-10-10 LAB — CBC
HCT: 30.8 % — ABNORMAL LOW (ref 36.0–46.0)
Hemoglobin: 10.8 g/dL — ABNORMAL LOW (ref 12.0–15.0)
MCH: 30.8 pg (ref 26.0–34.0)
MCHC: 35.1 g/dL (ref 30.0–36.0)
MCV: 87.7 fL (ref 80.0–100.0)
Platelets: 262 10*3/uL (ref 150–400)
RBC: 3.51 MIL/uL — ABNORMAL LOW (ref 3.87–5.11)
RDW: 13.5 % (ref 11.5–15.5)
WBC: 6.5 10*3/uL (ref 4.0–10.5)
nRBC: 0 % (ref 0.0–0.2)

## 2021-10-10 MED ORDER — TRAMADOL HCL 50 MG PO TABS
50.0000 mg | ORAL_TABLET | Freq: Three times a day (TID) | ORAL | Status: DC | PRN
  Administered 2021-10-10 – 2021-10-12 (×2): 50 mg via ORAL
  Filled 2021-10-10 (×3): qty 1

## 2021-10-10 MED ORDER — ACETAMINOPHEN 500 MG PO TABS
1000.0000 mg | ORAL_TABLET | Freq: Four times a day (QID) | ORAL | Status: DC
  Administered 2021-10-10 – 2021-10-12 (×6): 1000 mg via ORAL
  Filled 2021-10-10 (×6): qty 2

## 2021-10-10 NOTE — Consult Note (Addendum)
[name]  Patient ID: [name], MRN: [id number], DOB/AGE: 24-Jan-1926 87 y.o. Admit date: 10/06/2021   Date of Consult: 10/10/2021 Primary Physician: Derinda Late, MD Primary Cardiologist: Nehemiah Massed  Chief Complaint:  Chief Complaint  Patient presents with   Near Syncope   Arm Injury   Reason for Consult:  Atrial fibrillation  HPI: 87 y.o. female with known chronic nonvalvular atrial fibrillation with reasonable heart rate control over a many year.  The patient has had a recent monitor in within the last year showing no evidence of sick sinus syndrome.  In addition to that the patient has been on antihypertensive medication management including amlodipine and losartan with no issues.  She has been on warfarin for further risk reduction and stroke with atrial fibrillation of which was fine until she has had a significant fall.  With this fall she has a large gaping wound in her left arm for which she has had significant improvements in swelling over the last several days.  She does have some oozing but no active bleeding.  We had stopped the warfarin at this time to lower her risk of further bleeding.  After long discussion with the family about her risk of falling and significant concerns in severe decrease in physical ability, we have decided not to continue any warfarin at this time.  Additionally there has been concerns of significant blood pressure control and will consider additional medication depending on concerns.  She will need further acute care with her left arm with outpatient wound care as well as she will need some other facility care as well due to the fact that she is unable to get up and move into the things she needs for home care.  There has not been any evidence of congestive heart failure myocardial infarction or angina at this time  Past Medical History:  Diagnosis Date   Hypertension       Surgical History: History reviewed. No  pertinent surgical history.   Home Meds: Prior to Admission medications   Medication Sig Start Date End Date Taking? Authorizing Provider  losartan (COZAAR) 50 MG tablet Take 50 mg by mouth daily. 07/20/21  Yes [provider]  memantine (NAMENDA) 5 MG tablet Take 5 mg by mouth 2 (two) times daily. 05/30/21  Yes [provider]  QUEtiapine (SEROQUEL) 50 MG tablet Take 50 mg by mouth at bedtime. 10/04/21  Yes [provider]  warfarin (COUMADIN) 3 MG tablet Take 3 mg by mouth daily. 09/19/21  Yes [provider]  amLODipine (NORVASC) 5 MG tablet Take 5 mg by mouth daily. Patient not taking: Reported on 10/06/2021 06/23/21   [provider]  warfarin (COUMADIN) 6 MG tablet Take 6 mg by mouth daily. 09/19/21   [provider]    Inpatient Medications:   acetaminophen  650 mg Oral Q6H   Or   acetaminophen  650 mg Rectal Q6H   amLODipine  10 mg Oral Daily   docusate sodium  100 mg Oral BID   losartan  50 mg Oral Daily   memantine  5 mg Oral BID   polyethylene glycol  17 g Oral Daily   QUEtiapine  50 mg Oral QHS    sodium chloride 75 mL/hr at 10/10/21 0512   cefTRIAXone (ROCEPHIN)  IV Stopped (10/09/21 2157)    Allergies: No Known Allergies  Social History   Socioeconomic History   Marital status: Widowed    Spouse name: Not on file  Number of children: Not on file   Years of education: Not on file   Highest education level: Not on file  Occupational History   Not on file  Tobacco Use   Smoking status: Never   Smokeless tobacco: Never  Substance and Sexual Activity   Alcohol use: Never   Drug use: Never   Sexual activity: Never  Other Topics Concern   Not on file  Social History Narrative   Not on file   Social Determinants of Health   Financial Resource Strain: Not on file  Food Insecurity: Not on file  Transportation Needs: Not on file  Physical Activity: Not on file  Stress: Not on file  Social Connections: Not  on file  Intimate Partner Violence: Not on file     History reviewed. No pertinent family history.   Review of Systems Positive for none Negative for: General:  chills, fever, night sweats or weight changes.  Cardiovascular: PND orthopnea syncope dizziness  Dermatological skin lesions rashes Respiratory: Cough congestion Urologic: Frequent urination urination at night and hematuria Abdominal: negative for nausea, vomiting, diarrhea, bright red blood per rectum, melena, or hematemesis Neurologic: negative for visual changes, and/or hearing changes  All other systems reviewed and are otherwise negative except as noted above.  Labs: No results for input(s): CKTOTAL, CKMB, TROPONINI in the last 72 hours. Lab Results  Component Value Date   WBC 6.5 10/10/2021   HGB 10.8 (L) 10/10/2021   HCT 30.8 (L) 10/10/2021   MCV 87.7 10/10/2021   PLT 262 10/10/2021    Recent Labs  Lab 10/07/21 0518 10/08/21 0533 10/10/21 0258  NA 130*   < > 131*  K 3.8   < > 3.9  CL 96*   < > 99  CO2 24   < > 26  BUN 15   < > 22  CREATININE 0.66   < > 0.68  CALCIUM 8.8*   < > 8.7*  PROT 7.0  --   --   BILITOT 1.5*  --   --   ALKPHOS 71  --   --   ALT 10  --   --   AST 22  --   --   GLUCOSE 102*   < > 109*   < > = values in this interval not displayed.   No results found for: CHOL, HDL, LDLCALC, TRIG No results found for: DDIMER  Radiology/Studies:  DG Forearm Left  Result Date: 10/06/2021 CLINICAL DATA:  Pain and swelling, trauma EXAM: LEFT FOREARM - 2 VIEW COMPARISON:  None. FINDINGS: No displaced fracture or dislocation is seen. There is marked soft tissue swelling along the dorsal aspect. Arterial calcifications are seen in the soft tissues. Osteopenia is seen in bony structures. Degenerative changes are noted in the intercarpal joints and first carpometacarpal joint. IMPRESSION: No displaced fracture or dislocation is seen in the left forearm. There is marked soft tissue swelling along the  dorsal aspect, possibly hematoma. Electronically Signed   By: Elmer Picker M.D.   On: 10/06/2021 12:55    EKG: Atrial fibrillation with controlled ventricular rate  Weights: There were no vitals filed for this visit.   Physical Exam: Blood pressure (!) 173/77, pulse 97, temperature (!) 97.5 F (36.4 C), temperature source Oral, resp. rate 17, SpO2 99 %. There is no height or weight on file to calculate BMI. General: Well developed, well nourished, in no acute distress. Head eyes ears nose throat: Normocephalic, atraumatic, sclera non-icteric, no xanthomas, nares are  without discharge. No apparent thyromegaly and/or mass  Lungs: Normal respiratory effort.  no wheezes, no rales, no rhonchi.  Heart: Irregular with normal S1 S2.  2-3+ aortic murmur gallop, no rub, PMI is normal size and placement, carotid upstroke normal without bruit, jugular venous pressure is normal Abdomen: Soft, non-tender, non-distended with normoactive bowel sounds. No hepatomegaly. No rebound/guarding. No obvious abdominal masses. Abdominal aorta is normal size without bruit Extremities: No edema. no cyanosis, no clubbing, no ulcers but patient does have left arm wound with improving edema Peripheral : 2+ bilateral upper extremity pulses, 2+ bilateral femoral pulses, 2+ bilateral dorsal pedal pulse Neuro: Not alert and oriented. No facial asymmetry. No focal deficit. Moves all extremities spontaneously. Musculoskeletal: Normal muscle tone without kyphosis Psych: Sometimes responds to questions appropriately with a normal affect.    Assessment: 87 year old female with chronic nonvalvular atrial fibrillation and hypertension in for a fall and injury to her left arm without evidence of congestive heart failure or acute coronary syndrome at high risk for continued falling.  Long discussion with family for further treatment as below  Plan: 1.  Abstain from warfarin due to significant fall risk and bleeding risk after  significant left arm injury 2.  Continuation of amlodipine and losartan at previous dosages as outpatient but would not add any other medication management at this time due to concerns of dizziness and/or increased exacerbation of fall risk.  We have discussed with the family that higher blood pressure temporarily is manageable  3.  Patient will need physical therapy [facility] patient will therapy assessment for need for short-term facility care 4.  No further cardiac diagnostics necessary at this time 5.  Wound care consult to assess needs for further wound care in the near future  Signed, Corey Skains M.D. [facility] 10/10/2021, 8:10 AM

## 2021-10-10 NOTE — Care Management Important Message (Signed)
Important Message  Patient Details  Name: Journee Bobrowski MRN: [id number] Date of Birth: 05-12-1926   Medicare Important Message Given:  Yes     Dannette Barbara 10/10/2021, 11:42 AM

## 2021-10-10 NOTE — Progress Notes (Signed)
PROGRESS NOTE    Maylin Freeburg  XFG:[id number] DOB: 1926/06/21 DOA: 10/06/2021 PCP: Derinda Late, MD    Brief Narrative:  87 year old with history of dementia, chronic A. fib on Coumadin presents to the ER on 12/15 with dizziness, progressive swelling and discoloration of the left forearm after suffering from a laceration 3 days ago at home.  Also noted to be recently getting more agitated and sleepless at night. Found to have significant hematoma and admitted to the hospital.   Assessment & Plan:   Principal Problem:   Traumatic hematoma of left forearm Active Problems:   Dementia without behavioral disturbance (HCC)   Atrial fibrillation, chronic (HCC)   HTN (hypertension)   Supratherapeutic INR   Recurrent falls   Hypokalemia   Acute urinary retention  Traumatic hematoma of the left forearm with skin laceration and discoloration: Seen by surgery and recommended conservative management. Dressing instructions attached.  Daily nonstick dressing with antibiotics. Anticoagulation is stopped for good.  Paroxysmal A. fib: Rate controlled.  Coumadin discontinued.  Plans to stop Coumadin for able.  Recurrent urinary retention: Due to immobility.  In and out catheterization.  Mobilize to bathroom and attempt straight cath if needed.  We will try to avoid Foley catheter placement.  Dementia with behavioral disturbances: Recently having nighttime symptoms with hallucinations and some delirium. Fall precautions.  Delirium precautions.  Frequent reorientation. Started on Seroquel 50 mg at night, continue. Low-dose tramadol for pain.  Around-the-clock Tylenol for pain relief.  PT OT and short-term rehab before going home with caretakers.  Essential hypertension: Blood pressure stable on amlodipine losartan.  Patient with deteriorating clinical status, needing more support system at home.  Will certainly benefit with more rehab, palliative care discussion and support.   DVT  prophylaxis: SCDs Start: 10/06/21 1416   Code Status: DNR Family Communication: Daughter-in-law at the bedside Disposition Plan: Status is: Inpatient  Remains inpatient appropriate because: Wound care, IV antibiotics, mobility.         Consultants:  Orthopedics Cardiology  Procedures:  None  Antimicrobials:  Rocephin 12/70---   Subjective: Patient seen and examined.  Poor historian.  Patient herself was confused but calm.  She did not offer any complaints.  Daughter in law at the bedside. Reportedly retaining some urine and had 2-3 straight cath last 24 hours. Patient does not complain of pain but becomes uncomfortable on left arm movement.  Objective: Vitals:   10/09/21 1528 10/09/21 1944 10/10/21 0507 10/10/21 0803  BP: (!) 160/84 (!) 149/88 (!) 179/98 (!) 173/77  Pulse: 90 88 93 97  Resp: 20 14 16 17   Temp: 98.5 F (36.9 C) 98.1 F (36.7 C) 98.2 F (36.8 C) (!) 97.5 F (36.4 C)  TempSrc: Oral Oral Oral Oral  SpO2: 96% 98% 98% 99%    Intake/Output Summary (Last 24 hours) at 10/10/2021 1248 Last data filed at 10/10/2021 9678 Gross per 24 hour  Intake 1056.54 ml  Output 550 ml  Net 506.54 ml   There were no vitals filed for this visit.  Examination:  General exam: Appears frail and debilitated. Alert but not oriented.  Not in any obvious distress.  Looks fairly comfortable.  On room air. Respiratory system: Clear to auscultation. Respiratory effort normal. Cardiovascular system: S1 & S2 heard,  Gastrointestinal system: Soft.  Nontender. Central nervous system: Alert but not oriented. Left forearm on dressing, discolored and ecchymotic.  Large skin tear present.    Data Reviewed: I have personally reviewed following labs and imaging studies  CBC:  Recent Labs  Lab 10/06/21 1109 10/07/21 0518 10/08/21 0533 10/09/21 0509 10/10/21 0258  WBC 6.7 7.4  --  5.7 6.5  HGB 14.8 12.7 10.8* 11.1* 10.8*  HCT 44.0 37.4 31.8* 32.2* 30.8*  MCV 89.2 87.0   --  89.2 87.7  PLT 249 257  --  239 370   Basic Metabolic Panel: Recent Labs  Lab 10/06/21 1109 10/07/21 0518 10/08/21 0533 10/09/21 0509 10/10/21 0258  NA 131* 130* 135 132* 131*  K 2.8* 3.8 3.5 3.9 3.9  CL 93* 96* 99 97* 99  CO2 28 24 29 27 26   GLUCOSE 115* 102* 103* 88 109*  BUN 13 15 22 23 22   CREATININE 0.64 0.66 0.66 0.74 0.68  CALCIUM 8.8* 8.8* 8.6* 8.9 8.7*  MG  --  1.8  --   --   --   PHOS  --  3.3  --   --   --    GFR: CrCl cannot be calculated (Unknown ideal weight.). Liver Function Tests: Recent Labs  Lab 10/06/21 1109 10/07/21 0518  AST 23 22  ALT 12 10  ALKPHOS 80 71  BILITOT 1.2 1.5*  PROT 7.0 7.0  ALBUMIN 3.9 4.1   No results for input(s): LIPASE, AMYLASE in the last 168 hours. No results for input(s): AMMONIA in the last 168 hours. Coagulation Profile: Recent Labs  Lab 10/06/21 1109 10/07/21 0806 10/08/21 0533 10/09/21 0509  INR 3.0* 2.1* 1.3* 1.1   Cardiac Enzymes: No results for input(s): CKTOTAL, CKMB, CKMBINDEX, TROPONINI in the last 168 hours. BNP (last 3 results) No results for input(s): PROBNP in the last 8760 hours. HbA1C: No results for input(s): HGBA1C in the last 72 hours. CBG: Recent Labs  Lab 10/06/21 1446  GLUCAP 130*   Lipid Profile: No results for input(s): CHOL, HDL, LDLCALC, TRIG, CHOLHDL, LDLDIRECT in the last 72 hours. Thyroid Function Tests: No results for input(s): TSH, T4TOTAL, FREET4, T3FREE, THYROIDAB in the last 72 hours. Anemia Panel: No results for input(s): VITAMINB12, FOLATE, FERRITIN, TIBC, IRON, RETICCTPCT in the last 72 hours. Sepsis Labs: No results for input(s): PROCALCITON, LATICACIDVEN in the last 168 hours.  Recent Results (from the past 240 hour(s))  Resp Panel by RT-PCR (Flu A&B, Covid) Nasopharyngeal Swab     Status: None   Collection Time: 10/06/21 11:59 AM   Specimen: Nasopharyngeal Swab; Nasopharyngeal(NP) swabs in vial transport medium  Result Value Ref Range Status   SARS  Coronavirus 2 by RT PCR NEGATIVE NEGATIVE Final    Comment: (NOTE) SARS-CoV-2 target nucleic acids are NOT DETECTED.  The SARS-CoV-2 RNA is generally detectable in upper respiratory specimens during the acute phase of infection. The lowest concentration of SARS-CoV-2 viral copies this assay can detect is 138 copies/mL. A negative result does not preclude SARS-Cov-2 infection and should not be used as the sole basis for treatment or other patient management decisions. A negative result may occur with  improper specimen collection/handling, submission of specimen other than nasopharyngeal swab, presence of viral mutation(s) within the areas targeted by this assay, and inadequate number of viral copies(<138 copies/mL). A negative result must be combined with clinical observations, patient history, and epidemiological information. The expected result is Negative.  Fact Sheet for Patients:  EntrepreneurPulse.com.au  Fact Sheet for Healthcare Providers:  IncredibleEmployment.be  This test is no t yet approved or cleared by the [place] FDA and  has been authorized for detection and/or diagnosis of SARS-CoV-2 by FDA under an Emergency Use Authorization (EUA). This EUA will  remain  in effect (meaning this test can be used) for the duration of the COVID-19 declaration under Section 564(b)(1) of the Act, 21 U.S.C.section 360bbb-3(b)(1), unless the authorization is terminated  or revoked sooner.       Influenza A by PCR NEGATIVE NEGATIVE Final   Influenza B by PCR NEGATIVE NEGATIVE Final    Comment: (NOTE) The Xpert Xpress SARS-CoV-2/FLU/RSV plus assay is intended as an aid in the diagnosis of influenza from Nasopharyngeal swab specimens and should not be used as a sole basis for treatment. Nasal washings and aspirates are unacceptable for Xpert Xpress SARS-CoV-2/FLU/RSV testing.  Fact Sheet for  Patients: EntrepreneurPulse.com.au  Fact Sheet for Healthcare Providers: IncredibleEmployment.be  This test is not yet approved or cleared by the [place] FDA and has been authorized for detection and/or diagnosis of SARS-CoV-2 by FDA under an Emergency Use Authorization (EUA). This EUA will remain in effect (meaning this test can be used) for the duration of the COVID-19 declaration under Section 564(b)(1) of the Act, 21 U.S.C. section 360bbb-3(b)(1), unless the authorization is terminated or revoked.  Performed at [facility], [address] [place], [place] [address]   Urine Culture     Status: Abnormal   Collection Time: 10/07/21 12:25 PM   Specimen: Urine, Random  Result Value Ref Range Status   Specimen Description   Final    URINE, RANDOM Performed at [facility], [address] [place], [place] [address]    Special Requests   Final    NONE Performed at [facility], [place], [place] [address]    Culture 60,000 COLONIES/mL ESCHERICHIA COLI (A)  Final   Report Status 10/10/2021 FINAL  Final   Organism ID, Bacteria ESCHERICHIA COLI (A)  Final      Susceptibility   Escherichia coli - MIC*    AMPICILLIN <=2 SENSITIVE Sensitive     CEFAZOLIN <=4 SENSITIVE Sensitive     CEFEPIME <=0.12 SENSITIVE Sensitive     CEFTRIAXONE <=0.25 SENSITIVE Sensitive     CIPROFLOXACIN <=0.25 SENSITIVE Sensitive     GENTAMICIN <=1 SENSITIVE Sensitive     IMIPENEM <=0.25 SENSITIVE Sensitive     NITROFURANTOIN <=16 SENSITIVE Sensitive     TRIMETH/SULFA <=20 SENSITIVE Sensitive     AMPICILLIN/SULBACTAM <=2 SENSITIVE Sensitive     PIP/TAZO <=4 SENSITIVE Sensitive     * 60,000 COLONIES/mL ESCHERICHIA COLI         Radiology Studies: No results found.      Scheduled Meds:  acetaminophen  1,000 mg Oral Q6H   amLODipine  10 mg Oral Daily   docusate sodium  100 mg Oral BID   losartan  50 mg Oral  Daily   memantine  5 mg Oral BID   polyethylene glycol  17 g Oral Daily   QUEtiapine  50 mg Oral QHS   Continuous Infusions:  cefTRIAXone (ROCEPHIN)  IV Stopped (10/09/21 2157)     LOS: 4 days    Time spent: 30 minutes    Barb Merino, MD [facility] Pager [phone]

## 2021-10-10 NOTE — Progress Notes (Signed)
Physical Therapy Treatment Patient Details Name: [name] MRN: [id number] DOB: 1926/08/31 Today's Date: 10/10/2021   History of Present Illness Patient is a 87 year old female with PMH  significant for essential hypertension, dementia, atrial fibrillation on Coumadin presented in the ED on 10/06/2021 with complaints of dizziness and progressive swelling and discoloration of the left forearm, along with a laceration sustained when she had a fall back on Monday 12/12.  X-ray in the ED showed soft tissue swelling, no fracture or dislocation.    PT Comments    Patient tolerated session much better compared to initial eval. Patient was able to communicate well with simple questions. No pain was reported from patient throughout session, however patient required frequent cueing not to mess with L forearm bandage. Patient was able to demonstrate fair standing balance with BUE support from walking at [facility] for approximately 5 minutes. Patient able to ambulate ~110 feet at [place] with RW. Constant cueing required for direction and reorientation due to dementia. Patient demonstrated poor obstacle negotiation, requiring significant cueing and Min A at the walker to clear.  Patient would continue to benefit from skilled physical therapy at a SNF following acute discharge. Daughter in law reported agreement with this plan.   Recommendations for follow up therapy are one component of a multi-disciplinary discharge planning process, led by the attending physician.  Recommendations may be updated based on patient status, additional functional criteria and insurance authorization.  Follow Up Recommendations  Skilled nursing-short term rehab (<3 hours/day)     Assistance Recommended at Discharge Frequent or constant Supervision/Assistance  Equipment Recommendations  None recommended by PT    Recommendations for Other Services       Precautions / Restrictions Precautions Precautions:  Fall Restrictions Weight Bearing Restrictions: No     Mobility  Bed Mobility Overal bed mobility: Needs Assistance Bed Mobility: Sit to Supine       Sit to supine: Supervision   General bed mobility comments: increased time and effort; required BUE support form hand rails for support    Transfers Overall transfer level: Needs assistance Equipment used: Rolling walker (2 wheels) Transfers: Sit to/from Stand Sit to Stand: Min guard           General transfer comment: cueing on proper hand placement when pushing up to stand (from bed/BSC vs pulling on walker)    Ambulation/Gait Ambulation/Gait assistance: Min assist Gait Distance (Feet): 110 Feet Assistive device: Rolling walker (2 wheels) Gait Pattern/deviations: Shuffle;Decreased step length - right;Decreased step length - left;Trunk flexed;Narrow base of support Gait velocity: decreased     General Gait Details: downward gaze, required constant cueing for direction/re-orientation; poor obstacle negotiation; required Min A to move walker around obstacles; able to demonstrate backwards steps when cued to ambulate around obstacles   Stairs             Wheelchair Mobility    Modified Rankin (Stroke Patients Only)       Balance Overall balance assessment: Needs assistance Sitting-balance support: Single extremity supported;Bilateral upper extremity supported;Feet supported Sitting balance-Leahy Scale: Fair     Standing balance support: Bilateral upper extremity supported;During functional activity Standing balance-Leahy Scale: Fair Standing balance comment: Patient able to stand x5 minutes with no LOB at [facility]; cueing to keep hands placed on walker for stability                            Cognition Arousal/Alertness: Suspect due to medications Behavior  During Therapy: Flat affect Overall Cognitive Status: Impaired/Different from baseline                         Following Commands:  Follows one step commands inconsistently Safety/Judgement: Decreased awareness of safety;Decreased awareness of deficits     General Comments: Patient dementia seriously limits therapy progress, patient seemed for conversational today during session, was able to answer simple questions, continues/freqent cueing on re-orientation to task        Exercises      General Comments        Pertinent Vitals/Pain Pain Score: 0-No pain Pain Location: Patient reporte no pain, however required cueing not to mess with L arm bandages Pain Intervention(s): Limited activity within patient's tolerance;Monitored during session;Repositioned    Home Living                          Prior Function            PT Goals (current goals can now be found in the care plan section) Acute Rehab PT Goals Patient Stated Goal: to get patient home, however daughter in law is in agreement with patient going to SNF PT Goal Formulation: With patient/family Time For Goal Achievement: 10/22/21 Potential to Achieve Goals: Fair Progress towards PT goals: Progressing toward goals    Frequency    Min 2X/week      PT Plan      Co-evaluation PT/OT/SLP Co-Evaluation/Treatment: Yes Reason for Co-Treatment: To address functional/ADL transfers PT goals addressed during session: Mobility/safety with mobility;Proper use of DME        AM-PAC PT "6 Clicks" Mobility   Outcome Measure  Help needed turning from your back to your side while in a flat bed without using bedrails?: A Little Help needed moving from lying on your back to sitting on the side of a flat bed without using bedrails?: A Little Help needed moving to and from a bed to a chair (including a wheelchair)?: A Little Help needed standing up from a chair using your arms (e.g., wheelchair or bedside chair)?: A Little Help needed to walk in hospital room?: A Little Help needed climbing 3-5 steps with a railing? : A Lot 6 Click Score: 17     End of Session Equipment Utilized During Treatment: Gait belt Activity Tolerance: Patient tolerated treatment well Patient left: in bed;with call bell/phone within reach;with bed alarm set;with family/visitor present;with restraints reapplied Nurse Communication: Mobility status PT Visit Diagnosis: Repeated falls (R29.6);Muscle weakness (generalized) (M62.81);Unsteadiness on feet (R26.81);Difficulty in walking, not elsewhere classified (R26.2);Dizziness and giddiness (R42)     Time: 9323-5573 PT Time Calculation (min) (ACUTE ONLY): 37 min  Charges:  $Gait Training: 8-22 mins                     Iva Boop, PT  10/10/21. 1:01 PM

## 2021-10-10 NOTE — Progress Notes (Signed)
Fingers appear much less swollen today compared to Friday when I last saw her.  Forearm is also improved with dressing was not changed.  No evidence of compartment syndrome at this time that is unlikely.  Recommend continued holding of anticoagulation and see if that is best for her long-term with her mental status age and now 2 admissions for complications related to anticoagulation.

## 2021-10-10 NOTE — Evaluation (Signed)
Occupational Therapy Evaluation Patient Details Name: [name] MRN: [id number] DOB: 1926/08/22 Today's Date: 10/10/2021   History of Present Illness Patient is a 87 year old female with PMH  significant for essential hypertension, dementia, atrial fibrillation on Coumadin presented in the ED on 10/06/2021 with complaints of dizziness and progressive swelling and discoloration of the left forearm, along with a laceration sustained when she had a fall back on Monday 12/12.  X-ray in the ED showed soft tissue swelling, no fracture or dislocation.   Clinical Impression   Ms Weatherman was seen for OT evaluation this date. Prior to hospital admission, pt was MOD I for mobility and ADLs. Pt lives alone with 24/7 caregiver in 2 level home, 4 STE. Daughter in law at bedside states caregiver unable to provide physical assist for mobility. Pt presents to acute OT demonstrating impaired ADL performance and functional mobility 2/2 decreased activity tolerance, functional strength/balance deficits, and poor command following. Pt currently requires MOD A for BSC t/f. MAX A + RW +2 for perihygiene in standing, +2 assist CGA and cues to prevent pt picking at LUE wrapping/soiled gown. MOD A don/doff gown in sitting. MIN A + RW + MAX cues for in room mobility - assist to place LUE on RW and manage RW as pt demonstrates poor navigation of objects. Pt would benefit from skilled OT to address noted impairments and functional limitations (see below for any additional details) in order to maximize safety and independence while minimizing falls risk and caregiver burden. Upon hospital discharge, recommend STR to maximize pt safety and return to PLOF.       Recommendations for follow up therapy are one component of a multi-disciplinary discharge planning process, led by the attending physician.  Recommendations may be updated based on patient status, additional functional criteria and insurance authorization.   Follow Up  Recommendations  Skilled nursing-short term rehab (<3 hours/day)    Assistance Recommended at Discharge Frequent or constant Supervision/Assistance  Functional Status Assessment  Patient has had a recent decline in their functional status and demonstrates the ability to make significant improvements in function in a reasonable and predictable amount of time.  Equipment Recommendations  BSC/3in1    Recommendations for Other Services       Precautions / Restrictions Precautions Precautions: Fall Restrictions Weight Bearing Restrictions: No      Mobility Bed Mobility Overal bed mobility: Needs Assistance Bed Mobility: Supine to Sit     Supine to sit: Mod assist;HOB elevated Sit to supine: Supervision   General bed mobility comments: difficulty following commands    Transfers Overall transfer level: Needs assistance Equipment used: Rolling walker (2 wheels) Transfers: Sit to/from Stand Sit to Stand: Min assist           General transfer comment: assist to place LUE on RW and manage RW      Balance Overall balance assessment: Needs assistance Sitting-balance support: Feet supported;Single extremity supported Sitting balance-Leahy Scale: Fair     Standing balance support: Bilateral upper extremity supported;Reliant on assistive device for balance Standing balance-Leahy Scale: Fair Standing balance comment: requires BUE support                           ADL either performed or assessed with clinical judgement   ADL Overall ADL's : Needs assistance/impaired  General ADL Comments: MOD A for BSC t/f. MAX A + RW +2 for perihygiene in standing, +2 assist CGA and cues to prevent pt picking at LUE wrapping/soiled gown. MOD A don/doff gown in sitting. MIN A + RW + MAX cues for in room mobility - assist to place LUE on RW and manage RW as pt demonstrates poor navigation of objects.      Pertinent  Vitals/Pain Pain Assessment: Faces Pain Score: 0-No pain Faces Pain Scale: Hurts little more Pain Location: LUE Pain Descriptors / Indicators: Aching Pain Intervention(s): Limited activity within patient's tolerance;Repositioned     Hand Dominance Right   Extremity/Trunk Assessment Upper Extremity Assessment Upper Extremity Assessment: Generalized weakness   Lower Extremity Assessment Lower Extremity Assessment: Generalized weakness       Communication Communication Communication: HOH   Cognition Arousal/Alertness: Awake/alert Behavior During Therapy: Flat affect Overall Cognitive Status: Impaired/Different from baseline Area of Impairment: Memory;Following commands;Safety/judgement                     Memory: Decreased short-term memory Following Commands: Follows one step commands inconsistently Safety/Judgement: Decreased awareness of safety;Decreased awareness of deficits     General Comments: MAX cueing to attend/sequence task     General Comments       Exercises Exercises: Other exercises Other Exercises Other Exercises: Pt and family educated re: OT role, DME recs, d/c recs, falls prevention, ECS Other Exercises: LBD, toileting, UBD, sup<>sit, sit<>stand, sitting/standing balnace/tolerance   Shoulder Instructions      Home Living Family/patient expects to be discharged to:: Private residence Living Arrangements: Alone Available Help at Discharge: Available 24 hours/day (caregiver 24/7, Son and daughter in law live 2 doors down) Type of Home: House Home Access: Stairs to enter Technical brewer of Steps: 4 Entrance Stairs-Rails: Can reach both Home Layout: Two level (pt sleeps upstairs) Alternate Level Stairs-Number of Steps: patient has chair lift that she utilizes to get upstairs   Bathroom Shower/Tub: Teacher, early years/pre: Standard     Home Equipment: Conservation officer, nature (2 wheels);Grab bars - tub/shower;Wheelchair - manual           Prior Functioning/Environment Prior Level of Function : Independent/Modified Independent             Mobility Comments: Per caregiver other than the last week, patient was ambulating at SBA with RW ADLs Comments: completed ADLs w/o physical assist        OT Problem List: Decreased strength;Decreased range of motion;Decreased activity tolerance;Impaired balance (sitting and/or standing);Decreased cognition;Decreased safety awareness      OT Treatment/Interventions: Self-care/ADL training;Therapeutic exercise;Energy conservation;DME and/or AE instruction;Therapeutic activities;Patient/family education;Balance training    OT Goals(Current goals can be found in the care plan section) Acute Rehab OT Goals Patient Stated Goal: to go home OT Goal Formulation: With patient/family Time For Goal Achievement: 10/24/21 Potential to Achieve Goals: Fair ADL Goals Pt Will Perform Grooming: with set-up;with supervision (c MIN CUES) Pt Will Perform Lower Body Dressing: with set-up;with supervision;sit to/from stand Pt Will Transfer to Toilet: with set-up;with supervision;ambulating;regular height toilet  OT Frequency: Min 2X/week   Barriers to D/C: Inaccessible home environment          Co-evaluation PT/OT/SLP Co-Evaluation/Treatment: Yes Reason for Co-Treatment: Necessary to address cognition/behavior during functional activity;To address functional/ADL transfers PT goals addressed during session: Mobility/safety with mobility OT goals addressed during session: ADL's and self-care      AM-PAC OT "6 Clicks" Daily Activity     Outcome Measure Help  from another person eating meals?: A Little Help from another person taking care of personal grooming?: A Little Help from another person toileting, which includes using toliet, bedpan, or urinal?: A Lot Help from another person bathing (including washing, rinsing, drying)?: A Lot Help from another person to put on and taking off  regular upper body clothing?: A Lot Help from another person to put on and taking off regular lower body clothing?: A Lot 6 Click Score: 14   End of Session Equipment Utilized During Treatment: Rolling walker (2 wheels);Gait belt Nurse Communication: Mobility status  Activity Tolerance: Patient tolerated treatment well Patient left: in bed;with call bell/phone within reach;with bed alarm set;with nursing/sitter in room;with family/visitor present  OT Visit Diagnosis: Other abnormalities of gait and mobility (R26.89);Muscle weakness (generalized) (M62.81)                Time: 4888-9169 OT Time Calculation (min): 37 min Charges:  OT General Charges $OT Visit: 1 Visit OT Evaluation $OT Eval Moderate Complexity: 1 Mod OT Treatments $Self Care/Home Management : 8-22 mins  Dessie Coma, M.S. OTR/L  10/10/21, 4:38 PM  ascom [phone]

## 2021-10-10 NOTE — TOC Progression Note (Signed)
Transition of Care [facility]) - Progression Note    Patient Details  Name: [name] MRN: [id number] Date of Birth: 1926-05-03  [facility] CM/SW Contact  Beverly Sessions, RN Phone Number: 10/10/2021, 3:53 PM  Clinical Narrative:    Met with patient and daughter and law at bedside.  After patient worked with therapy today Daughter in Sports coach Vevelyn Royals confirms that plan will be home with home health services through [facility].   Referral made to [facility] with [facility]  for hospital bed and [facility]  Anticipated DC tomorrow.  DNR put on chart for MD to sign     Barriers to Discharge: Continued Medical Work up  Expected Discharge Plan and Services                                     HH Arranged: PT, RN [facility] Agency: [place] ([facility]) Date [place]: 10/09/21 Time [place]: [phone] Representative spoke with at [place]: [place] (SDOH) Interventions    Readmission Risk Interventions No flowsheet data found.

## 2021-10-10 NOTE — Care Management (Signed)
°  °  Durable Medical Equipment  (From admission, onward)           Start     Ordered   10/10/21 1314  For home use only DME lightweight manual wheelchair with seat cushion  Once       Comments: Patient suffers from weakness which impairs their ability to perform daily activities like toileting in the home.  A walker will not resolve  issue with performing activities of daily living. A wheelchair will allow patient to safely perform daily activities. Patient is not able to propel themselves in the home using a standard weight wheelchair due to general weakness. Patient can self propel in the lightweight wheelchair. Length of need Lifetime. Accessories: elevating leg rests (ELRs), wheel locks, extensions and anti-tippers.   10/10/21 1314   10/09/21 1147  For home use only DME Hospital bed  Once       Question Answer Comment  Length of Need Lifetime   Patient has (list medical condition): Dementia, generalized weakness, recurrent falls   The above medical condition requires: Patient requires the ability to reposition frequently   Bed type Semi-electric      10/09/21 1146

## 2021-10-10 NOTE — Consult Note (Signed)
WOC Nurse Consult Note: Reason for Consult: full thickness skin tear  Wound type:trauma Pressure Injury POA: N/A Measurement: 6.5cm x 1.8cm x 0.4cm Wound bed: aple pink and white subcutaneous tissues Drainage (amount, consistency, odor) scant serous Periwound: edema, ecchymosis Dressing procedure/placement/frequency: I have placed a dressing today that is nonadherent and antimicrobial (xeroform and recommend this daily for up to 21 days.  It is topped with an ABD pad and secured with a Kerlix roll gauze wrap topped with an ACE bandage wrapped to the elbow to reduce edema through mild compression. Elevation is on a pillow. Change daily.  [place] nursing team will not follow, but will remain available to this patient, the nursing and medical teams.  Please re-consult if needed. Thanks, Maudie Flakes, MSN, RN, [place], Arther Abbott  Pager# [phone]

## 2021-10-10 NOTE — Consult Note (Addendum)
Consultation Note Date: 10/10/2021   [name]  DOB: 1925-12-25  MRN: [id number]  Age / Sex: 87 y.o., female  PCP: Brittany Late, MD Referring Physician: Barb Merino, MD  Reason for Consultation: Establishing goals of care  HPI/Patient Profile: 87 y.o. female  with past medical history of chronic, nonvalvular A. Fib (Coumadin), HTN, and dementia admitted on 10/06/2021 with fall with left arm injury.  Patient sustained a fall at home on 10/03/2021.  Patient presented to the emergency department on 1215 with complaints of dizziness and progressive swelling and discoloration of the left forearm.  Palliative medicine was consulted to discuss goals of care.  Clinical Assessment and Goals of Care: I have reviewed medical records including EPIC notes, labs and imaging, assessed the patient and then met with patient and her daughter-in-law [name] to discuss diagnosis prognosis, GOC, EOL wishes, disposition and options.  I introduced Palliative Medicine as specialized medical care for people living with serious illness. It focuses on providing relief from the symptoms and stress of a serious illness. The goal is to improve quality of life for both the patient and the family.  We discussed a brief life review of the patient.  Patient was married for over 8 years before her husband passed earlier this year from metastatic liver cancer.  Patient has 2 sons, 1 of whom is very involved in her care.  Patient lives at home with a 24/7 caregiver.  As far as functional and nutritional status patient was relatively independent with ADLs with assistance of caregiver.  However, daughter-in-law [name] endorses a steady decline over the last 2 weeks.  Patient is no longer able to swallow pills.  Per endorses patient will eat when prompted and fed.  Patient was ambulating independently but now has difficulty  without standby assistance.  We discussed patient's current illness and what it means in the larger context of patient's on-going co-morbidities.  Natural disease trajectory and expectations at EOL were discussed. The difference between aggressive medical intervention and comfort care was considered in light of the patient's goals of care.   Education offered regarding concept specific to human mortality and the limitations of medical interventions to prolong life with patients diagnoses with dementia.  Hospice in the home, hospice inpatient facility, and palliative outpatient services described in detail.  [name] shares that the plans are set for the patient to go home but that an extra layer of support with palliative services would be acceptable.  [name] has a healthy understanding that the patient's dementia is a progressive and chronic illness that is not reversible.  She shares she understands that hospice is likely on the horizon for the patient and that palliative services would be a helpful guide on the side to help determine when to enact her hospice benefits.  Discussed with patient/family the importance of continued conversation with family and the medical providers regarding overall plan of care and treatment options, ensuring decisions are within the context of the patients values and GOCs.    Questions and  concerns were addressed. The family was encouraged to call with questions or concerns.   Primary Decision Maker NEXT OF KIN  Code Status/Advance Care Planning: DNR Referral for outpatient palliative services  Prognosis:   Unable to determine  Discharge Planning: Home with Palliative Services  Primary Diagnoses: Present on Admission:  Traumatic hematoma of left forearm  Dementia without behavioral disturbance (HCC)  Atrial fibrillation, chronic (HCC)  HTN (hypertension)  Supratherapeutic INR  Hypokalemia  (Resolved) Hypomagnesemia   Physical Exam Vitals and nursing  note reviewed.  Constitutional:      Appearance: Normal appearance.  HENT:     Head: Normocephalic and atraumatic.     Mouth/Throat:     Mouth: Mucous membranes are moist.  Cardiovascular:     Rate and Rhythm: Normal rate.     Heart sounds: Murmur heard.  Pulmonary:     Effort: Pulmonary effort is normal.  Abdominal:     Palpations: Abdomen is soft.  Musculoskeletal:     Comments: Generalized weakness  Skin:    Comments: LUE ecchymosis, non-pitting +1 edema  Neurological:     Mental Status: She is alert. Mental status is at baseline.    Vital Signs: BP (!) 173/77 (BP Location: Right Arm)    Pulse 97    Temp (!) 97.5 F (36.4 C) (Oral)    Resp 17    SpO2 99%  Pain Scale: Faces   Pain Score: 7  SpO2: SpO2: 99 % O2 Device:SpO2: 99 % O2 Flow Rate: .O2 Flow Rate (L/min): 2 L/min  Palliative Assessment/Data: 50%     I discussed this patient's plan of care with patient, patient's daughter in law [name].  Thank you for this consult. Palliative medicine will continue to follow and assist holistically.   Time Total: 70 minutes Greater than 50%  of this time was spent counseling and coordinating care related to the above assessment and plan.  Signed by: Jordan Hawks, DNP, FNP-BC Palliative Medicine    Please contact Palliative Medicine Team phone at [phone] for questions and concerns.  For individual provider: See Shea Evans

## 2021-10-11 MED ORDER — LORAZEPAM 0.5 MG PO TABS: 0.5000 mg | ORAL_TABLET | Freq: Three times a day (TID) | ORAL | 0 refills | Status: AC | PRN

## 2021-10-11 MED ORDER — ACETAMINOPHEN 500 MG PO TABS
1000.0000 mg | ORAL_TABLET | Freq: Four times a day (QID) | ORAL | 0 refills | Status: DC | PRN
Start: 2021-10-11 — End: 2023-08-06

## 2021-10-11 MED ORDER — AMLODIPINE BESYLATE 10 MG PO TABS: 10.0000 mg | ORAL_TABLET | Freq: Every day | ORAL | 0 refills | Status: AC

## 2021-10-11 NOTE — Discharge Summary (Signed)
Physician Discharge Summary  [name] ZJI:[id number] DOB: 1926-06-10 DOA: 10/06/2021  PCP: Derinda Late, MD  Admit date: 10/06/2021 Discharge date: 10/11/2021  Admitted From: Home Disposition: Home with home infusion therapy  Recommendations for Outpatient Follow-up:  Follow up with PCP in 1-2 weeks   Home Health: PT/OT Equipment/Devices: Present at home  Discharge Condition: Fair CODE STATUS: DNR Diet recommendation: Regular diet, low salt.  Discharge summary: 87 year old with history of dementia, chronic A. fib on Coumadin presented to the ER on 12/15 with dizziness, progressive swelling and discoloration of the left forearm after suffering from a laceration 3 days ago at home.  Also noted to be recently getting more agitated and sleepless at night. Found to have significant hematoma and admitted to the hospital.   Traumatic hematoma of the left forearm with skin laceration and discoloration: Seen by surgery and recommended conservative management. Dressing instructions attached.  Daily nonstick dressing with antibiotics. Anticoagulation is stopped altogether.   Paroxysmal A. fib: Rate controlled.  Coumadin discontinued.  Rate controlled.   Recurrent urinary retention: Due to immobility.  Patient able to urinate when taken to bedside commode.  Does have some postvoid residual but not significant.  Encourage taking to commode every time she needs urinating.   Avoid catheterization.   Should be seen at home, if significant retention may need Foley catheter.     Dementia with behavioral disturbances: Recently having nighttime symptoms with hallucinations and some delirium. Fall precautions.  Delirium precautions.  Frequent reorientation. Started on Seroquel 50 mg at night, continue. Around-the-clock Tylenol for pain relief.   Return to home environment. Prescribed Ativan 0.5 mg every 8 hours to use for anxiety, agitation and discomfort. Patient was seen by  palliative care in the hospital, will send a community palliative care referral.  Essential hypertension: Blood pressure elevated with recent outpatient change in medications.   Go back on amlodipine 10 mg daily.  Losartan 50 mg daily.     Patient with multiple issues and advancing dementia.  Has caretaker support at home.  She can go home once arrangements are made at home for her safe return.      Discharge Diagnoses:  Principal Problem:   Traumatic hematoma of left forearm Active Problems:   Dementia without behavioral disturbance (HCC)   Atrial fibrillation, chronic (HCC)   HTN (hypertension)   Supratherapeutic INR   Recurrent falls   Hypokalemia   Acute urinary retention    Discharge Instructions  Discharge Instructions     Diet general   Complete by: As directed    Discharge wound care:   Complete by: As directed    Wound care to left forearm skin tear, full thickness:  cleanse with NS, pat dry. Cover with folded xeroform gauze (lawson # [id number]), top with dry gauze, ABD and then secure with Kerlix roll gauze. Top Kerlix with ACE bandage applied from base of fingers to elbow. Elevate arm on pillow.   Increase activity slowly   Complete by: As directed       Allergies as of 10/11/2021   No Known Allergies      Medication List     STOP taking these medications    warfarin 3 MG tablet Commonly known as: COUMADIN   warfarin 6 MG tablet Commonly known as: COUMADIN       TAKE these medications    acetaminophen 500 MG tablet Commonly known as: TYLENOL Take 2 tablets (1,000 mg total) by mouth every 6 (six) hours as needed for mild  pain or moderate pain.   amLODipine 10 MG tablet Commonly known as: NORVASC Take 1 tablet (10 mg total) by mouth daily. What changed:  medication strength how much to take   LORazepam 0.5 MG tablet Commonly known as: Ativan Take 1 tablet (0.5 mg total) by mouth every 8 (eight) hours as needed for up to 14 days for anxiety.    losartan 50 MG tablet Commonly known as: COZAAR Take 50 mg by mouth daily.   memantine 5 MG tablet Commonly known as: NAMENDA Take 5 mg by mouth 2 (two) times daily.   QUEtiapine 50 MG tablet Commonly known as: SEROQUEL Take 50 mg by mouth at bedtime.               Durable Medical Equipment  (From admission, onward)           Start     Ordered   10/10/21 1314  For home use only DME lightweight manual wheelchair with seat cushion  Once       Comments: Patient suffers from weakness which impairs their ability to perform daily activities like toileting in the home.  A walker will not resolve  issue with performing activities of daily living. A wheelchair will allow patient to safely perform daily activities. Patient is not able to propel themselves in the home using a standard weight wheelchair due to general weakness. Patient can self propel in the lightweight wheelchair. Length of need Lifetime. Accessories: elevating leg rests (ELRs), wheel locks, extensions and anti-tippers.   10/10/21 1314   10/09/21 1147  For home use only DME Hospital bed  Once       Question Answer Comment  Length of Need Lifetime   Patient has (list medical condition): Dementia, generalized weakness, recurrent falls   The above medical condition requires: Patient requires the ability to reposition frequently   Bed type Semi-electric      10/09/21 1146              Discharge Care Instructions  (From admission, onward)           Start     Ordered   10/11/21 0000  Discharge wound care:       Comments: Wound care to left forearm skin tear, full thickness:  cleanse with NS, pat dry. Cover with folded xeroform gauze (lawson # [phone]), top with dry gauze, ABD and then secure with Kerlix roll gauze. Top Kerlix with ACE bandage applied from base of fingers to elbow. Elevate arm on pillow.   10/11/21 1453            No Known Allergies  Consultations: Palliative  care Surgery Cardiology   Procedures/Studies: DG Forearm Left  Result Date: 10/06/2021 CLINICAL DATA:  Pain and swelling, trauma EXAM: LEFT FOREARM - 2 VIEW COMPARISON:  None. FINDINGS: No displaced fracture or dislocation is seen. There is marked soft tissue swelling along the dorsal aspect. Arterial calcifications are seen in the soft tissues. Osteopenia is seen in bony structures. Degenerative changes are noted in the intercarpal joints and first carpometacarpal joint. IMPRESSION: No displaced fracture or dislocation is seen in the left forearm. There is marked soft tissue swelling along the dorsal aspect, possibly hematoma. Electronically Signed   By: Elmer Picker M.D.   On: 10/06/2021 12:55   (Echo, Carotid, EGD, Colonoscopy, ERCP)    Subjective: Patient seen and examined.  Pleasantly confused.  Overnight events noted.  Patient is more anxious and agitated when she had full bladder.  Otherwise, she was able to answer few basic questions and was pleasant in the afternoon. Nursing reported more than 400 mL on bladder scan, she was able to urinate about 275 mL when  placed in the bedside commode.   Discharge Exam: Vitals:   10/11/21 0546 10/11/21 0836  BP: (!) 165/83 (!) 179/98  Pulse: 92 97  Resp:  18  Temp:  (!) 97.3 F (36.3 C)  SpO2:  99%   Vitals:   10/10/21 2016 10/11/21 0520 10/11/21 0546 10/11/21 0836  BP: (!) 164/83 (!) 194/82 (!) 165/83 (!) 179/98  Pulse: 72 94 92 97  Resp: 20 18  18   Temp:  97.6 F (36.4 C)  (!) 97.3 F (36.3 C)  TempSrc:    Oral  SpO2: 97% 98%  99%  Weight:      Height:        General exam: Appears frail and debilitated. Alert but not oriented.  Not in any obvious distress.  Looks fairly comfortable.  On room air. Respiratory system: Clear to auscultation. Respiratory effort normal. Cardiovascular system: S1 & S2 heard,  Gastrointestinal system: Soft.  Nontender. Central nervous system: Alert but not oriented. Left forearm on  dressing, discolored and ecchymotic.  Large skin tear present.    The results of significant diagnostics from this hospitalization (including imaging, microbiology, ancillary and laboratory) are listed below for reference.     Microbiology: Recent Results (from the past 240 hour(s))  Resp Panel by RT-PCR (Flu A&B, Covid) Nasopharyngeal Swab     Status: None   Collection Time: 10/06/21 11:59 AM   Specimen: Nasopharyngeal Swab; Nasopharyngeal(NP) swabs in vial transport medium  Result Value Ref Range Status   SARS Coronavirus 2 by RT PCR NEGATIVE NEGATIVE Final    Comment: (NOTE) SARS-CoV-2 target nucleic acids are NOT DETECTED.  The SARS-CoV-2 RNA is generally detectable in upper respiratory specimens during the acute phase of infection. The lowest concentration of SARS-CoV-2 viral copies this assay can detect is 138 copies/mL. A negative result does not preclude SARS-Cov-2 infection and should not be used as the sole basis for treatment or other patient management decisions. A negative result may occur with  improper specimen collection/handling, submission of specimen other than nasopharyngeal swab, presence of viral mutation(s) within the areas targeted by this assay, and inadequate number of viral copies(<138 copies/mL). A negative result must be combined with clinical observations, patient history, and epidemiological information. The expected result is Negative.  Fact Sheet for Patients:  EntrepreneurPulse.com.au  Fact Sheet for Healthcare Providers:  IncredibleEmployment.be  This test is no t yet approved or cleared by the [place] FDA and  has been authorized for detection and/or diagnosis of SARS-CoV-2 by FDA under an Emergency Use Authorization (EUA). This EUA will remain  in effect (meaning this test can be used) for the duration of the COVID-19 declaration under Section 564(b)(1) of the Act, 21 U.S.C.section 360bbb-3(b)(1),  unless the authorization is terminated  or revoked sooner.       Influenza A by PCR NEGATIVE NEGATIVE Final   Influenza B by PCR NEGATIVE NEGATIVE Final    Comment: (NOTE) The Xpert Xpress SARS-CoV-2/FLU/RSV plus assay is intended as an aid in the diagnosis of influenza from Nasopharyngeal swab specimens and should not be used as a sole basis for treatment. Nasal washings and aspirates are unacceptable for Xpert Xpress SARS-CoV-2/FLU/RSV testing.  Fact Sheet for Patients: EntrepreneurPulse.com.au  Fact Sheet for Healthcare Providers: IncredibleEmployment.be  This test is not yet approved or cleared  by the [place] and has been authorized for detection and/or diagnosis of SARS-CoV-2 by FDA under an Emergency Use Authorization (EUA). This EUA will remain in effect (meaning this test can be used) for the duration of the COVID-19 declaration under Section 564(b)(1) of the Act, 21 U.S.C. section 360bbb-3(b)(1), unless the authorization is terminated or revoked.  Performed at [facility], [address], [place], [place] [address]   Urine Culture     Status: Abnormal   Collection Time: 10/07/21 12:25 PM   Specimen: Urine, Random  Result Value Ref Range Status   Specimen Description   Final    URINE, RANDOM Performed at [facility], [address] [place],  [address]    Special Requests   Final    NONE Performed at [facility], [place], [place] [address]    Culture 60,000 COLONIES/mL ESCHERICHIA COLI (A)  Final   Report Status 10/10/2021 FINAL  Final   Organism ID, Bacteria ESCHERICHIA COLI (A)  Final      Susceptibility   Escherichia coli - MIC*    AMPICILLIN <=2 SENSITIVE Sensitive     CEFAZOLIN <=4 SENSITIVE Sensitive     CEFEPIME <=0.12 SENSITIVE Sensitive     CEFTRIAXONE <=0.25 SENSITIVE Sensitive     CIPROFLOXACIN <=0.25 SENSITIVE Sensitive     GENTAMICIN <=1  SENSITIVE Sensitive     IMIPENEM <=0.25 SENSITIVE Sensitive     NITROFURANTOIN <=16 SENSITIVE Sensitive     TRIMETH/SULFA <=20 SENSITIVE Sensitive     AMPICILLIN/SULBACTAM <=2 SENSITIVE Sensitive     PIP/TAZO <=4 SENSITIVE Sensitive     * 60,000 COLONIES/mL ESCHERICHIA COLI     Labs: BNP (last 3 results) No results for input(s): BNP in the last 8760 hours. Basic Metabolic Panel: Recent Labs  Lab 10/06/21 1109 10/07/21 0518 10/08/21 0533 10/09/21 0509 10/10/21 0258  NA 131* 130* 135 132* 131*  K 2.8* 3.8 3.5 3.9 3.9  CL 93* 96* 99 97* 99  CO2 28 24 29 27 26   GLUCOSE 115* 102* 103* 88 109*  BUN 13 15 22 23 22   CREATININE 0.64 0.66 0.66 0.74 0.68  CALCIUM 8.8* 8.8* 8.6* 8.9 8.7*  MG  --  1.8  --   --   --   PHOS  --  3.3  --   --   --    Liver Function Tests: Recent Labs  Lab 10/06/21 1109 10/07/21 0518  AST 23 22  ALT 12 10  ALKPHOS 80 71  BILITOT 1.2 1.5*  PROT 7.0 7.0  ALBUMIN 3.9 4.1   No results for input(s): LIPASE, AMYLASE in the last 168 hours. No results for input(s): AMMONIA in the last 168 hours. CBC: Recent Labs  Lab 10/06/21 1109 10/07/21 0518 10/08/21 0533 10/09/21 0509 10/10/21 0258  WBC 6.7 7.4  --  5.7 6.5  HGB 14.8 12.7 10.8* 11.1* 10.8*  HCT 44.0 37.4 31.8* 32.2* 30.8*  MCV 89.2 87.0  --  89.2 87.7  PLT 249 257  --  239 262   Cardiac Enzymes: No results for input(s): CKTOTAL, CKMB, CKMBINDEX, TROPONINI in the last 168 hours. BNP: Invalid input(s): POCBNP CBG: Recent Labs  Lab 10/06/21 1446  GLUCAP 130*   D-Dimer No results for input(s): DDIMER in the last 72 hours. Hgb A1c No results for input(s): HGBA1C in the last 72 hours. Lipid Profile No results for input(s): CHOL, HDL, LDLCALC, TRIG, CHOLHDL, LDLDIRECT in the last 72 hours. Thyroid function studies  No results for input(s): TSH, T4TOTAL, T3FREE, THYROIDAB in the last 72 hours.  Invalid input(s): FREET3 Anemia work up No results for input(s): VITAMINB12, FOLATE,  FERRITIN, TIBC, IRON, RETICCTPCT in the last 72 hours. Urinalysis    Component Value Date/Time   COLORURINE YELLOW 10/07/2021 1215   APPEARANCEUR CLOUDY (A) 10/07/2021 1215   LABSPEC 1.015 10/07/2021 1215   PHURINE 8.5 (H) 10/07/2021 1215   GLUCOSEU NEGATIVE 10/07/2021 1215   HGBUR MODERATE (A) 10/07/2021 1215   BILIRUBINUR SMALL (A) 10/07/2021 1215   KETONESUR 15 (A) 10/07/2021 1215   PROTEINUR 30 (A) 10/07/2021 1215   NITRITE NEGATIVE 10/07/2021 1215   LEUKOCYTESUR NEGATIVE 10/07/2021 1215   Sepsis Labs Invalid input(s): PROCALCITONIN,  WBC,  LACTICIDVEN Microbiology Recent Results (from the past 240 hour(s))  Resp Panel by RT-PCR (Flu A&B, Covid) Nasopharyngeal Swab     Status: None   Collection Time: 10/06/21 11:59 AM   Specimen: Nasopharyngeal Swab; Nasopharyngeal(NP) swabs in vial transport medium  Result Value Ref Range Status   SARS Coronavirus 2 by RT PCR NEGATIVE NEGATIVE Final    Comment: (NOTE) SARS-CoV-2 target nucleic acids are NOT DETECTED.  The SARS-CoV-2 RNA is generally detectable in upper respiratory specimens during the acute phase of infection. The lowest concentration of SARS-CoV-2 viral copies this assay can detect is 138 copies/mL. A negative result does not preclude SARS-Cov-2 infection and should not be used as the sole basis for treatment or other patient management decisions. A negative result may occur with  improper specimen collection/handling, submission of specimen other than nasopharyngeal swab, presence of viral mutation(s) within the areas targeted by this assay, and inadequate number of viral copies(<138 copies/mL). A negative result must be combined with clinical observations, patient history, and epidemiological information. The expected result is Negative.  Fact Sheet for Patients:  EntrepreneurPulse.com.au  Fact Sheet for Healthcare Providers:  IncredibleEmployment.be  This test is no t yet  approved or cleared by the [place] FDA and  has been authorized for detection and/or diagnosis of SARS-CoV-2 by FDA under an Emergency Use Authorization (EUA). This EUA will remain  in effect (meaning this test can be used) for the duration of the COVID-19 declaration under Section 564(b)(1) of the Act, 21 U.S.C.section 360bbb-3(b)(1), unless the authorization is terminated  or revoked sooner.       Influenza A by PCR NEGATIVE NEGATIVE Final   Influenza B by PCR NEGATIVE NEGATIVE Final    Comment: (NOTE) The Xpert Xpress SARS-CoV-2/FLU/RSV plus assay is intended as an aid in the diagnosis of influenza from Nasopharyngeal swab specimens and should not be used as a sole basis for treatment. Nasal washings and aspirates are unacceptable for Xpert Xpress SARS-CoV-2/FLU/RSV testing.  Fact Sheet for Patients: EntrepreneurPulse.com.au  Fact Sheet for Healthcare Providers: IncredibleEmployment.be  This test is not yet approved or cleared by the [place] FDA and has been authorized for detection and/or diagnosis of SARS-CoV-2 by FDA under an Emergency Use Authorization (EUA). This EUA will remain in effect (meaning this test can be used) for the duration of the COVID-19 declaration under Section 564(b)(1) of the Act, 21 U.S.C. section 360bbb-3(b)(1), unless the authorization is terminated or revoked.  Performed at [facility], [address] [place], [place] [address]   Urine Culture     Status: Abnormal   Collection Time: 10/07/21 12:25 PM   Specimen: Urine, Random  Result Value Ref Range Status   Specimen Description   Final    URINE, RANDOM Performed at [facility], [address], [place], [place] [address]    Special Requests   Final    NONE Performed at [facility], [place],  [address]    Culture 60,000 COLONIES/mL ESCHERICHIA COLI (A)  Final   Report Status 10/10/2021  FINAL  Final   Organism ID, Bacteria ESCHERICHIA COLI (A)  Final      Susceptibility   Escherichia coli - MIC*    AMPICILLIN <=2 SENSITIVE Sensitive     CEFAZOLIN <=4 SENSITIVE Sensitive     CEFEPIME <=0.12 SENSITIVE Sensitive     CEFTRIAXONE <=0.25 SENSITIVE Sensitive     CIPROFLOXACIN <=0.25 SENSITIVE Sensitive     GENTAMICIN <=1 SENSITIVE Sensitive     IMIPENEM <=0.25 SENSITIVE Sensitive     NITROFURANTOIN <=16 SENSITIVE Sensitive     TRIMETH/SULFA <=20 SENSITIVE Sensitive     AMPICILLIN/SULBACTAM <=2 SENSITIVE Sensitive     PIP/TAZO <=4 SENSITIVE Sensitive     * 60,000 COLONIES/mL ESCHERICHIA COLI     Time coordinating discharge:  30 minutes  SIGNED:   Barb Merino, MD  [facility] 10/11/2021, 3:38 PM

## 2021-10-11 NOTE — Progress Notes (Addendum)
Mobility Specialist - Progress Note   10/11/21 1000  Mobility  Activity Dangled on edge of bed  Level of Assistance Moderate assist, patient does 50-74%  Distance Ambulated (ft) 0 ft  Mobility Sit up in bed/chair position for meals  Mobility Response Tolerated well  Mobility performed by Mobility specialist  $Mobility charge 1 Mobility    Pt sleeping in bed upon arrival, utilizing RA. Awakened by voice and initially agreeable to ambulation---pt sat EOB with modA and verbal/tactile cueing for continuation of activity. Pt dangled EOB for ~2 minutes, confused; requiring reorientation to task at hand. Pt then requested to return back to bed. Pt left in bed with alarm set.    Kathee Delton Mobility Specialist 10/11/21, 11:02 AM

## 2021-10-11 NOTE — TOC Transition Note (Signed)
Transition of Care [facility]) - CM/SW Discharge Note   Patient Details  Name: [name] MRN: [id number] Date of Birth: 1926/10/21  Transition of Care [facility]) CM/SW Contact:  Beverly Sessions, RN Phone Number: 10/11/2021, 4:00 PM   Clinical Narrative:    Patient to discharge today Hospital bed to be delivered at [facility] daughter in law aware of discharge plan.    EMS transport called EMS packet on chart MD to sign DNR  Corene Cornea with [name] notified of discharge    Final next level of care: [place] Barriers to Discharge: Continued Medical Work up   Patient Goals and CMS Choice Patient states their goals for this hospitalization and ongoing recovery are:: To get back home and recover CMS Medicare.gov Compare Post Acute Care list provided to:: Patient Represenative (must comment) Choice offered to / list presented to : Adult Children  Discharge Placement                  Name of family member notified: Don Patient and family notified of of transfer: 10/09/21  Discharge Plan and Services                          HH Arranged: PT, RN [facility] Agency: [place] ([place]) Date [place]: 10/09/21 Time [place]: [phone] Representative spoke with at [place]: [place] (SDOH) Interventions     Readmission Risk Interventions No flowsheet data found.

## 2021-10-11 NOTE — Progress Notes (Signed)
[facility] Encounter Note  Patient: [name] / Admit Date: 10/06/2021 / Date of Encounter: 10/11/2021, 7:21 AM   Subjective: No particular change of condition since yesterday.  Overall patient has had significant improvement in left arm swelling over the last 4 to 5 days.  She does have significant bruising but no current evidence of infection.  Wound care will be needed.  The patient has had continued atrial fibrillation with controlled ventricular rate with recent outpatient evaluation showing no evidence of sick sinus syndrome and no need for additional medication management or pacemaker placement.  The patient was on anticoagulation but currently discontinued due to significant fall risk and significant problems with left arm laceration and injury.  Patient's blood pressure has been elevated although back on her current medical regimen from outpatient and reasonable  Review of Systems: Positive for: Shortness of breath otherwise does not converse well Negative for: Vision change, hearing change, syncope, dizziness, nausea, vomiting,diarrhea, bloody stool, stomach pain, cough, congestion, diaphoresis, urinary frequency, urinary pain,skin lesions, skin rashes Others previously listed  Objective: Telemetry: Atrial fibrillation with controlled ventricular rate Physical Exam: Blood pressure (!) 165/83, pulse 92, temperature 97.6 F (36.4 C), resp. rate 18, height 5' (1.524 m), weight 45.5 kg, SpO2 98 %. Body mass index is 19.59 kg/m. General: Well developed, well nourished, in no acute distress. Head: Normocephalic, atraumatic, sclera non-icteric, no xanthomas, nares are without discharge. Neck: No apparent masses Lungs: Normal respirations with no wheezes, no rhonchi, no rales , no crackles   Heart: Irregular rate and rhythm, normal S1 S2, no murmur, no rub, no gallop, PMI is normal size and placement, carotid upstroke normal without bruit, jugular venous pressure  normal Abdomen: Soft, non-tender, non-distended with normoactive bowel sounds. No hepatosplenomegaly. Abdominal aorta is normal size without bruit Extremities: No edema, no clubbing, no cyanosis, no ulcers, with left arm bruising and laceration and mild swelling improved Peripheral: 2+ radial, 2+ femoral, 2+ dorsal pedal pulses Neuro: Alert and oriented. Moves all extremities spontaneously. Psych:  Responds to questions appropriately with a normal affect.   Intake/Output Summary (Last 24 hours) at 10/11/2021 0721 Last data filed at 10/11/2021 0604 Gross per 24 hour  Intake 120 ml  Output 500 ml  Net -380 ml    Inpatient Medications:   acetaminophen  1,000 mg Oral Q6H   amLODipine  10 mg Oral Daily   docusate sodium  100 mg Oral BID   losartan  50 mg Oral Daily   memantine  5 mg Oral BID   polyethylene glycol  17 g Oral Daily   QUEtiapine  50 mg Oral QHS   Infusions:   cefTRIAXone (ROCEPHIN)  IV 1 g (10/10/21 2028)    Labs: Recent Labs    10/09/21 0509 10/10/21 0258  NA 132* 131*  K 3.9 3.9  CL 97* 99  CO2 27 26  GLUCOSE 88 109*  BUN 23 22  CREATININE 0.74 0.68  CALCIUM 8.9 8.7*   No results for input(s): AST, ALT, ALKPHOS, BILITOT, PROT, ALBUMIN in the last 72 hours. Recent Labs    10/09/21 0509 10/10/21 0258  WBC 5.7 6.5  HGB 11.1* 10.8*  HCT 32.2* 30.8*  MCV 89.2 87.7  PLT 239 262   No results for input(s): CKTOTAL, CKMB, TROPONINI in the last 72 hours. Invalid input(s): POCBNP No results for input(s): HGBA1C in the last 72 hours.   Weights: Filed Weights   10/10/21 1500  Weight: 45.5 kg     Radiology/Studies:  DG Forearm Left  Result Date: 10/06/2021 CLINICAL DATA:  Pain and swelling, trauma EXAM: LEFT FOREARM - 2 VIEW COMPARISON:  None. FINDINGS: No displaced fracture or dislocation is seen. There is marked soft tissue swelling along the dorsal aspect. Arterial calcifications are seen in the soft tissues. Osteopenia is seen in bony structures.  Degenerative changes are noted in the intercarpal joints and first carpometacarpal joint. IMPRESSION: No displaced fracture or dislocation is seen in the left forearm. There is marked soft tissue swelling along the dorsal aspect, possibly hematoma. Electronically Signed   By: Elmer Picker M.D.   On: 10/06/2021 12:55     Assessment and Recommendation  87 y.o. female with known chronic nonvalvular atrial fibrillation with controlled ventricular rate and no need for medications or pacemaker placement and hypertension with significant fall and left arm laceration with improved edema and swelling needing wound care 1.  No additional medication management due to no evidence of sick sinus syndrome of atrial fibrillation without medications.  Would avoid beta-blocker or other rate regulating medications at this time 2.  Avoid anticoagulation at this time due to significant fall risk and bleeding and bruising risk.  We have discussed at length with the family about this choice for which they agreed 3.  Attenuation of medication management for hypertension control as before with no additional medications at this time.  Concerns for hypotension dizziness and fall if additional medications used 4.  Patient being prepped for outpatient facility transfer due to significant need in therapy 5.  No further cardiac diagnostics necessary at this time 6.  Call if further questions  Signed, Serafina Royals M.D. FACC

## 2021-10-11 NOTE — Progress Notes (Signed)
Bladder scanned patient and she had 498 in her bladder.  Received an order to in and out cath and got out 500 mL.  Will continue to monitor.  Christene Slates 10/11/2021  6:06 AM

## 2021-10-11 NOTE — Progress Notes (Signed)
Patient not discharged.  Hospital bed not scheduled to be delivered until 10:00pm (originally schedule for 10:00am) updated Vevelyn Royals (family member) and Dr. Sloan Leiter.

## 2021-10-12 ENCOUNTER — Encounter: Payer: Self-pay | Admitting: Dermatology

## 2021-10-12 NOTE — Progress Notes (Signed)
Patient seen and examined.  No new events overnight.  Could not go home last night because of logistic issues at home.  No new changes.  Discharge summary reviewed, medications reviewed.  Patient is stable to discharge home today.  Total time spent: 15 minutes.

## 2021-10-12 NOTE — Plan of Care (Signed)
Patient discharged to home via EMS.  Discharge packet givne to EMS for home.  Daughter aware of patient discharge. PIV removed prior to transport.

## 2021-10-22 IMAGING — MR MR THORACIC SPINE W/O CM
5 of 6 series · 28 of 48 positions shown · non-contrast
Comparison: 03/02/2016

CLINICAL DATA: Mid back pain. History of compression fractures with
kyphoplasty

EXAM:
MRI THORACIC SPINE WITHOUT CONTRAST
TECHNIQUE: Multiplanar, multisequence MR imaging of the thoracic spine was
performed. No intravenous contrast was administered.

[Series 16: T1 · sagittal · 5.0mm · 1.88mm/px · 2 of 9 slices shown (1 of 2)]
[im 1/9]
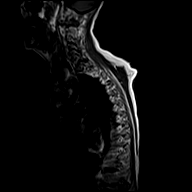
[im 9/9]
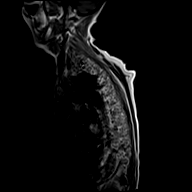

[Series 17: T2 · sagittal · 3.0mm · 1.06mm/px · 6 of 17 slices shown (1 of 2)]
[im 1/17]
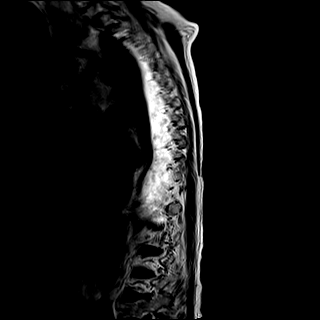
[im 4/17]
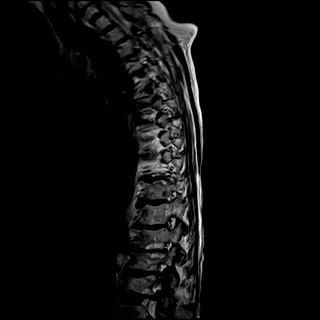
[im 7/17]
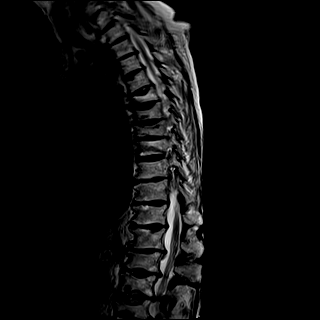
[im 10/17]
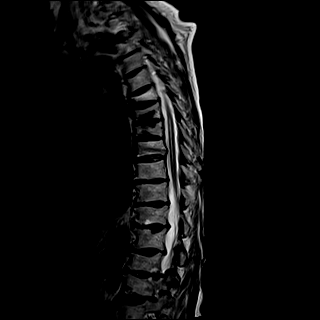
[im 13/17]
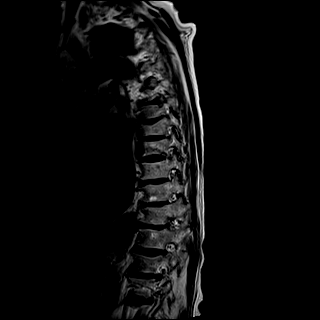
[im 17/17]
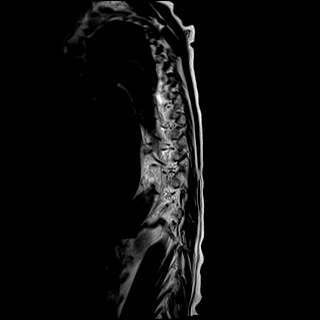

[Series 18: T1 · sagittal · 3.0mm · 1.06mm/px · 6 of 17 slices shown (2 of 2)]
[im 1/17]
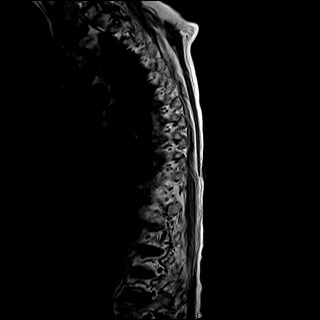
[im 4/17]
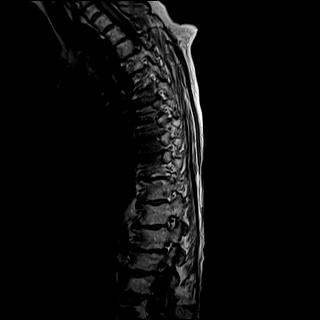
[im 7/17]
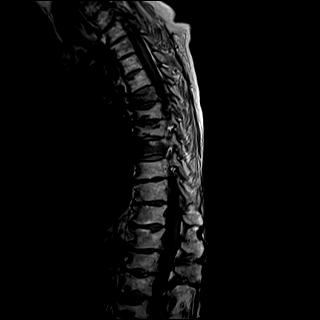
[im 10/17]
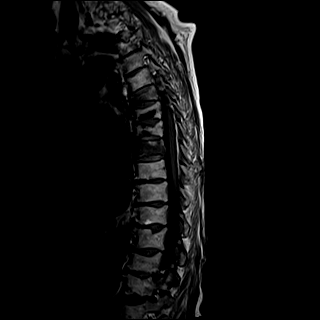
[im 13/17]
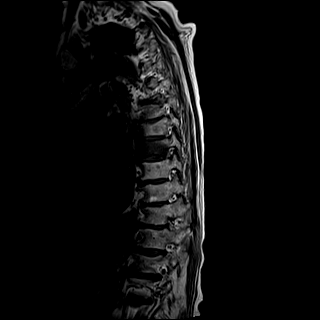
[im 17/17]
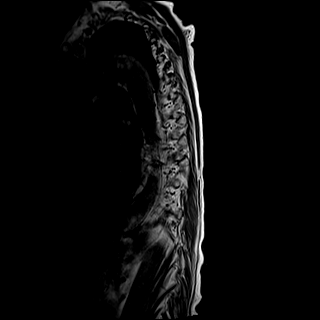

[Series 19: STIR · sagittal · 3.0mm · 0.53mm/px · 6 of 17 slices shown]
[im 1/17]
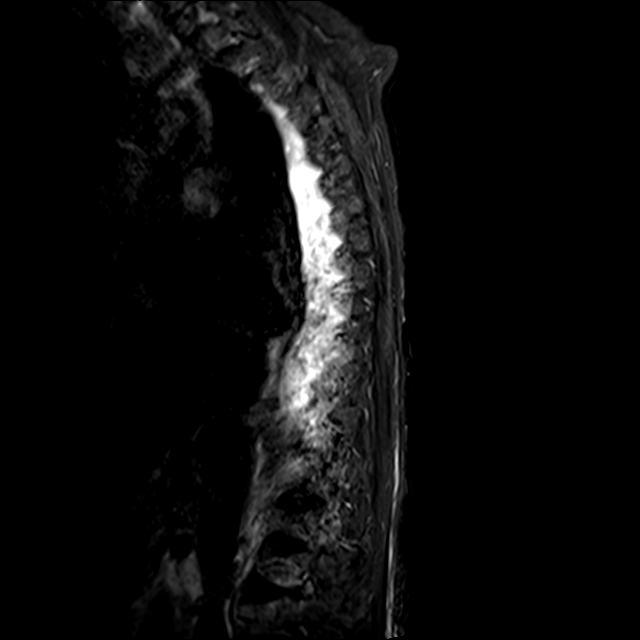
[im 4/17]
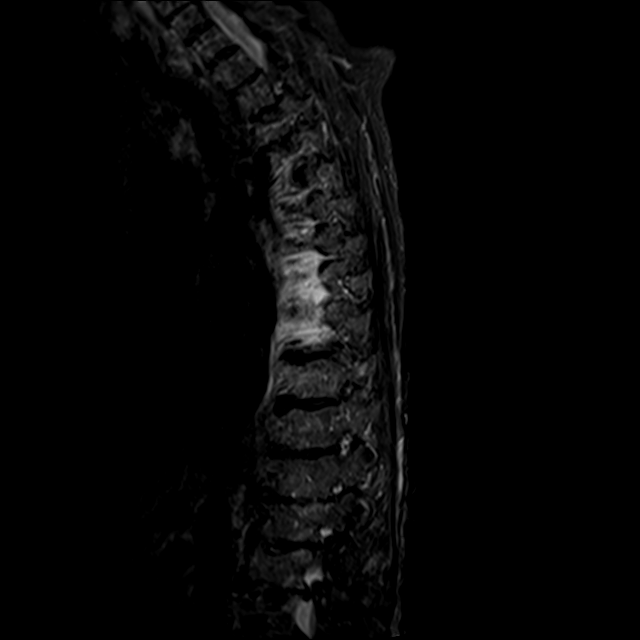
[im 7/17]
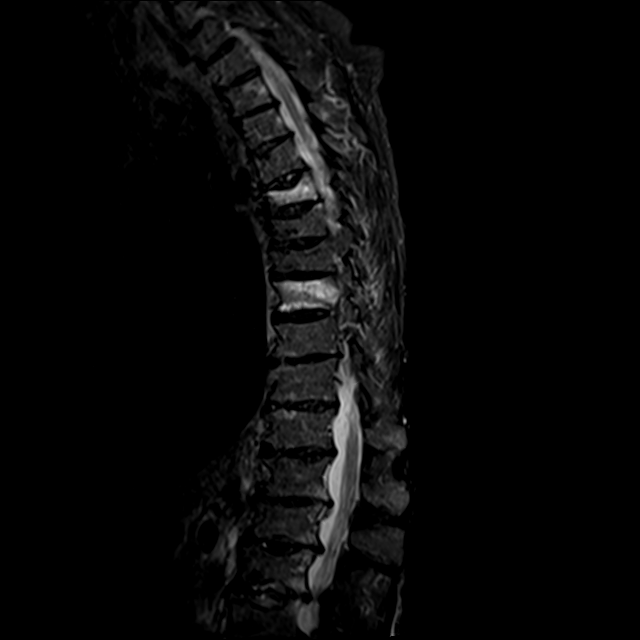
[im 10/17]
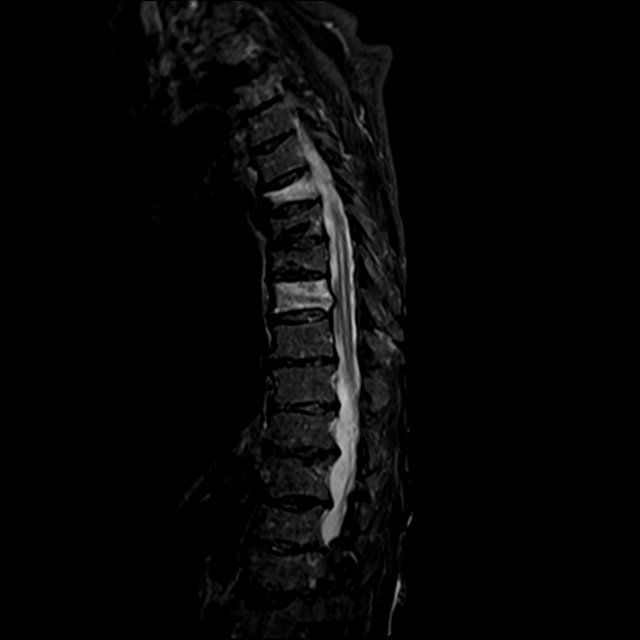
[im 13/17]
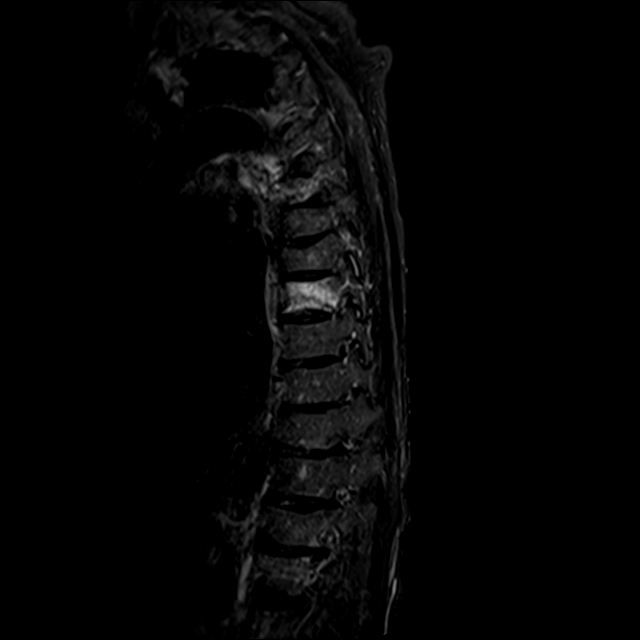
[im 17/17]
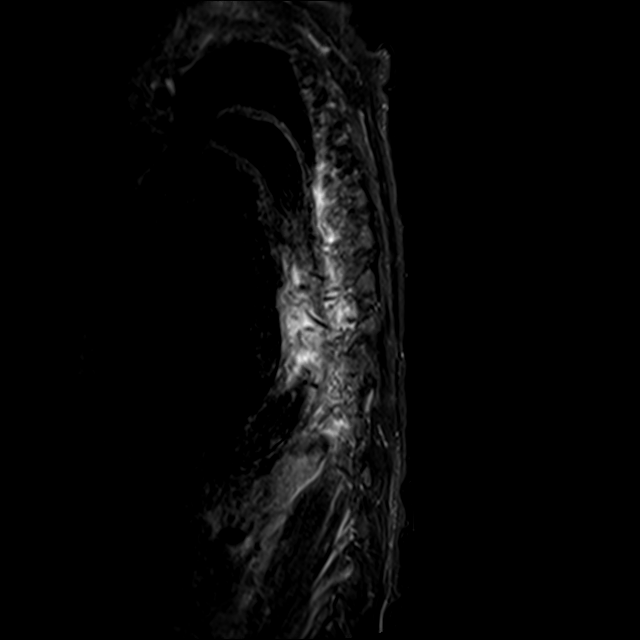

[Series 20: T2 · axial · 4.0mm · 0.59mm/px · z∈[-329,-149]mm · 8 of 39 slices shown (2 of 2)]
[im 1/39]
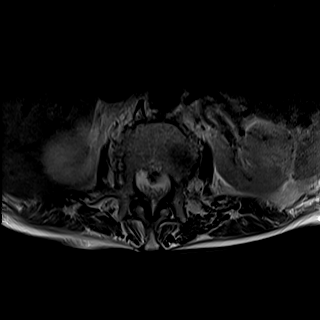
[im 6/39]
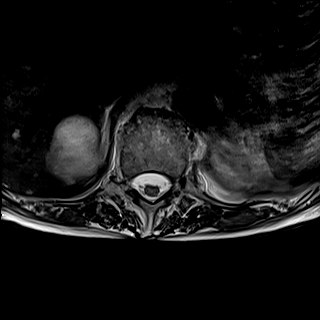
[im 12/39]
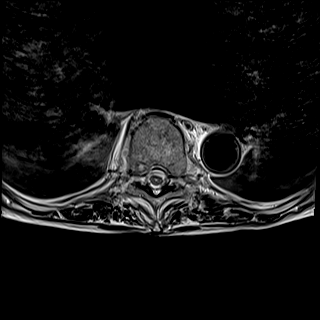
[im 18/39]
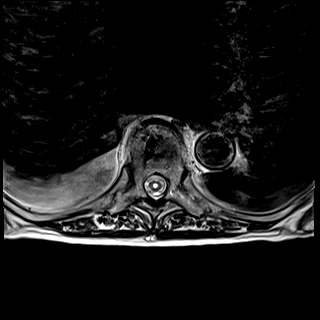
[im 21/39]
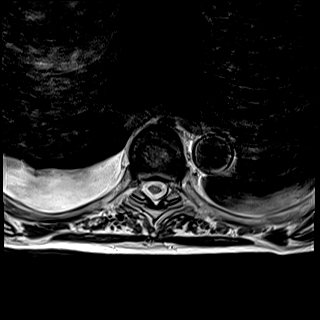
[im 27/39]
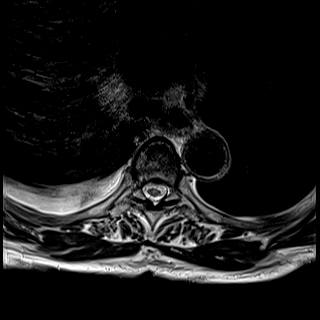
[im 33/39]
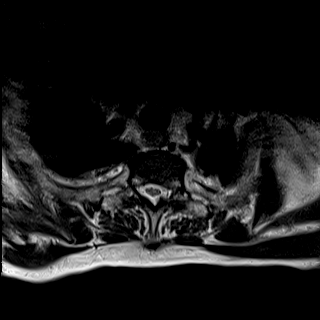
[im 39/39]
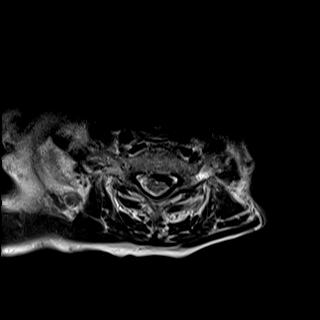

[28 of 48 positions shown; findings below may reference images not displayed]

FINDINGS: Alignment:  Maintained thoracic kyphosis. No static listhesis.

Vertebrae: Chronic mild superior endplate compression deformities of
the T7 and T8 vertebral bodies status post cement augmentation. No
residual marrow edema within these levels.

Acute compression fracture of the T6 vertebral body with
approximately 60% vertebral body height loss. No bony retropulsion.

Acute compression fracture of the T9 vertebral body involving the
inferior endplate with less than 10% vertebral body height loss.
There is 3 mm bony retropulsion without evidence of cord
compression.

There is mild marrow edema within the T3 vertebral body without
vertebral body height loss.

No evidence of discitis. Degenerative endplate marrow changes
throughout the thoracic spine.

Cord:  Unchanged small syrinx extending from the T7-T10 levels.

Paraspinal and other soft tissues: Small right and trace left
pleural effusions. Multiple cystic lesions within the visualized
portion of the liver, grossly unchanged from prior, incompletely
evaluated.

Disc levels:

Mild canal stenosis at the T9 level at site of compression fracture
with bony retropulsion. No cord signal change or cord compression.

Similar degree of multilevel degenerative disc disease throughout
the thoracic spine with small posterior disc bulges from T2-3
through T5-6. No high-grade foraminal or canal stenosis is seen
throughout the thoracic spine.

Visualized upper lumbar spine demonstrates multilevel spondylosis
including a central disc protrusion at L1-2 with 7 mm caudal
extension of disc material.
IMPRESSION: 1. Acute compression fracture of the T6 vertebral body with
approximately 60% vertebral body height loss.
2. Acute T9 inferior endplate compression fracture with less than
10% vertebral body height loss. There is mild bony retropulsion at
the fracture site resulting in mild canal stenosis without evidence
of cord compression.
3. Mild marrow edema within the T3 vertebral body without associated
vertebral body height loss. Developing compression fracture at this
site not excluded.
4. Chronic mild T7 and T8 superior endplate compression deformities
status post cement augmentation.
5. Stable small syrinx extending from T7-T10 levels.
6. Stable multilevel degenerative changes of the thoracic spine.
7. Small right and trace left pleural effusions.

These results will be called to the ordering clinician or
representative by the Radiologist Assistant, and communication
documented in the PACS or [REDACTED].

## 2021-11-05 IMAGING — XA DG C-ARM 1-60 MIN
1 series · 1 of 1 positions shown · non-contrast
Comparison: MRI dated January 06, 2020

CLINICAL DATA: T6 and T9 kyphoplasty

EXAM:
DG C-ARM 1-60 MIN
FLUOROSCOPY TIME:  Fluoroscopy Time:  3 minutes and 12 seconds
Number of Acquired Spot Images: 2

[Series 5: ortho standard · 1 of 1 slices shown]
[im 1/1]
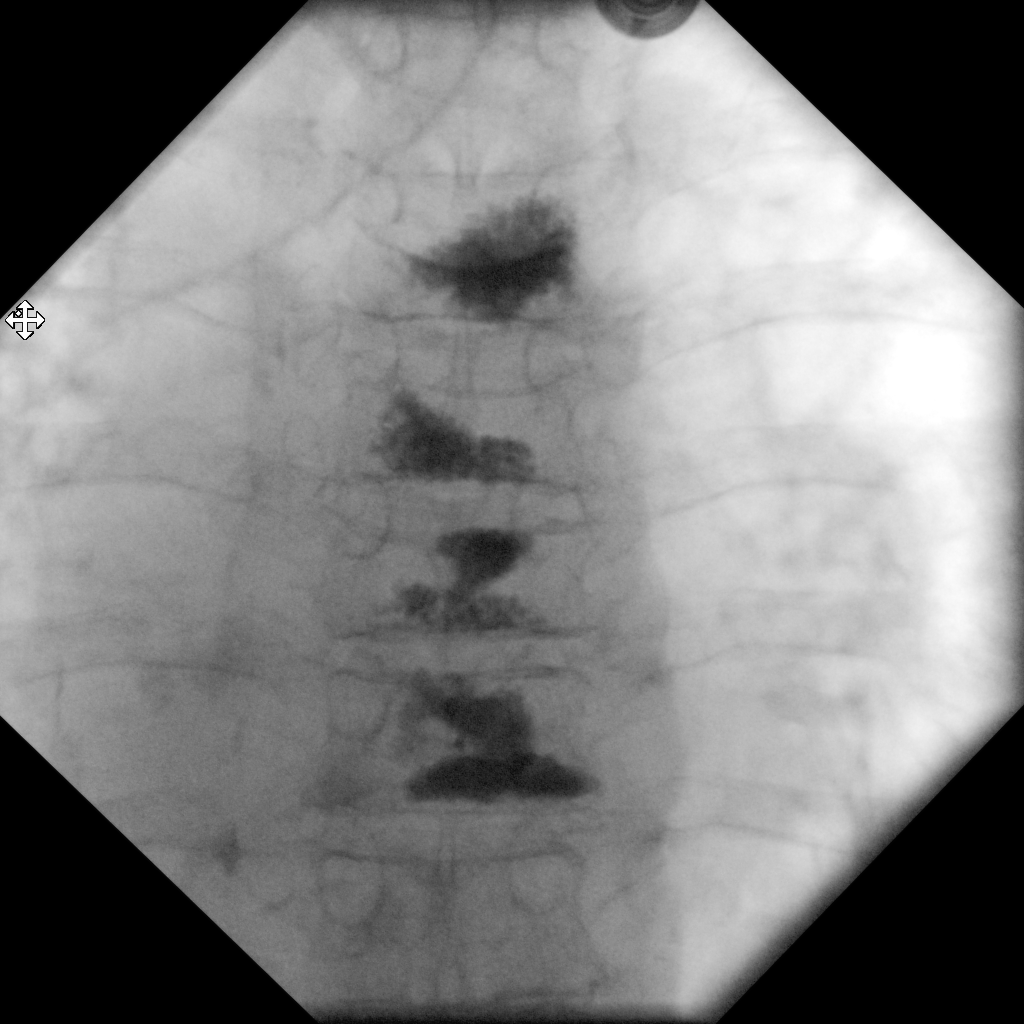

[1 of 1 positions shown; findings below may reference images not displayed]

FINDINGS: The patient has undergone reported T6 and T9 kyphoplasty. The
patient is status post prior T7 and T8 kyphoplasty. Again noted are
compression fractures of the T6 through T9 vertebral bodies.
IMPRESSION: Status post kyphoplasty at the T6 and T9 levels.

## 2021-11-11 ENCOUNTER — Other Ambulatory Visit: Payer: Self-pay

## 2021-11-11 ENCOUNTER — Encounter: Payer: Medicare Other | Attending: Internal Medicine | Admitting: Internal Medicine

## 2021-11-11 DIAGNOSIS — L98498 Non-pressure chronic ulcer of skin of other sites with other specified severity: Secondary | ICD-10-CM | POA: Insufficient documentation

## 2021-11-11 DIAGNOSIS — F0390 Unspecified dementia without behavioral disturbance: Secondary | ICD-10-CM | POA: Diagnosis not present

## 2021-11-11 NOTE — Progress Notes (Signed)
[name], [name] ([id number]) Visit Report for 11/11/2021 Allergy List Details [name], [name]. Date of Service: 11/11/2021 8:45 AM Medical Record Number: [id number] Patient Account Number: [id number] Date of Birth/Sex: 19-Mar-1926 (88 y.o. F) Treating RN: Carlene Coria Primary Care Loomis Anacker: Derinda Late Other Clinician: Referring Abbigale Mcelhaney: Derinda Late Treating Jaiceon Collister/Extender: Ricard Dillon Weeks in Treatment: 0 Allergies Active Allergies No Known Drug Allergies Allergy Notes Electronic Signature(s) Signed: 11/11/2021 10:08:44 AM By: Carlene Coria RN Entered By: Carlene Coria on 11/11/2021 08:58:23 [name], [name] ([id number]) -------------------------------------------------------------------------------- Arrival Information Details [name] Date of Service: 11/11/2021 8:45 AM Medical Record Number: [id number] Patient Account Number: [id number] Date of Birth/Sex: 06-14-1926 (87 y.o. F) Treating RN: Carlene Coria Primary Care Natina Wiginton: Derinda Late Other Clinician: Referring Cristo Ausburn: Derinda Late Treating Phillip Maffei/Extender: Tito Dine in Treatment: 0 Visit Information Patient Arrived: Wheel Chair Arrival Time: 08:55 Accompanied By: daughter Transfer Assistance: None Patient Identification Verified: Yes Secondary Verification Process Completed: Yes Patient Requires Transmission-Based Precautions: No Patient Has Alerts: No History Since Last Visit All ordered tests and consults were completed: No Added or deleted any medications: No Any new allergies or adverse reactions: No Had a fall or experienced change in activities of daily living that may affect risk of falls: No Signs or symptoms of abuse/neglect since last visito No Hospitalized since last visit: No Implantable device outside of the clinic excluding cellular tissue based products placed in the center since last visit: No Electronic  Signature(s) Signed: 11/11/2021 10:08:44 AM By: Carlene Coria RN Entered By: Carlene Coria on 11/11/2021 08:56:03 [name], [name] ([id number]) -------------------------------------------------------------------------------- Clinic Level of Care Assessment Details [name]. Date of Service: 11/11/2021 8:45 AM Medical Record Number: [id number] Patient Account Number: [id number] Date of Birth/Sex: August 14, 1926 (88 y.o. F) Treating RN: Carlene Coria Primary Care Lynnett Langlinais: Derinda Late Other Clinician: Referring Adhrit Krenz: Derinda Late Treating Agnes Probert/Extender: Tito Dine in Treatment: 0 Clinic Level of Care Assessment Items TOOL 1 Quantity Score X - Use when EandM and Procedure is performed on INITIAL visit 1 0 ASSESSMENTS - Nursing Assessment / Reassessment X - General Physical Exam (combine w/ comprehensive assessment (listed just below) when performed on new 1 20 pt. evals) X- 1 25 Comprehensive Assessment (HX, ROS, Risk Assessments, Wounds Hx, etc.) ASSESSMENTS - Wound and Skin Assessment / Reassessment []  - Dermatologic / Skin Assessment (not related to wound area) 0 ASSESSMENTS - Ostomy and/or Continence Assessment and Care []  - Incontinence Assessment and Management 0 []  - 0 Ostomy Care Assessment and Management (repouching, etc.) PROCESS - Coordination of Care X - Simple Patient / Family Education for ongoing care 1 15 []  - 0 Complex (extensive) Patient / Family Education for ongoing care X- 1 10 Staff obtains Programmer, systems, Records, Test Results / Process Orders []  - 0 Staff telephones HHA, Nursing Homes / Clarify orders / etc []  - 0 Routine Transfer to another Facility (non-emergent condition) []  - 0 Routine Hospital Admission (non-emergent condition) X- 1 15 New Admissions / Biomedical engineer / Ordering NPWT, Apligraf, etc. []  - 0 Emergency Hospital Admission (emergent condition) PROCESS - Special Needs []  - Pediatric / Minor  Patient Management 0 []  - 0 Isolation Patient Management []  - 0 Hearing / Language / Visual special needs []  - 0 Assessment of Community assistance (transportation, D/C planning, etc.) []  - 0 Additional assistance / Altered mentation []  - 0 Support Surface(s) Assessment (bed, cushion, seat, etc.) INTERVENTIONS - Miscellaneous []  - External ear exam 0 []  -  0 Patient Transfer (multiple staff / Civil Service fast streamer / Similar devices) []  - 0 Simple Staple / Suture removal (25 or less) []  - 0 Complex Staple / Suture removal (26 or more) []  - 0 Hypo/Hyperglycemic Management (do not check if billed separately) []  - 0 Ankle / Brachial Index (ABI) - do not check if billed separately Has the patient been seen at the hospital within the last three years: Yes Total Score: 85 Level Of Care: New/Established - Level 3 [name], [name] ([id number]) Electronic Signature(s) Signed: 11/11/2021 10:08:44 AM By: Carlene Coria RN Entered By: Carlene Coria on 11/11/2021 09:29:00 [name] ([id number]) -------------------------------------------------------------------------------- Encounter Discharge Information Details [name]. Date of Service: 11/11/2021 8:45 AM Medical Record Number: [id number] Patient Account Number: [id number] Date of Birth/Sex: [birth year] (88 y.o. F) Treating RN: Carlene Coria Primary Care Jatavis Malek: Derinda Late Other Clinician: Referring Akeyla Molden: Derinda Late Treating Jermari Tamargo/Extender: Tito Dine in Treatment: 0 Encounter Discharge Information Items Discharge Condition: Stable Ambulatory Status: Wheelchair Discharge Destination: Home Transportation: Private Auto Accompanied By: daughter Schedule Follow-up Appointment: Yes Clinical Summary of Care: Patient Declined Electronic Signature(s) Signed: 11/11/2021 10:08:44 AM By: Carlene Coria RN Entered By: Carlene Coria on 11/11/2021 09:43:21 [name], [name]  ([id number]) -------------------------------------------------------------------------------- Lower Extremity Assessment Details [name]. Date of Service: 11/11/2021 8:45 AM Medical Record Number: [id number] Patient Account Number: [id number] Date of Birth/Sex: 1926/03/30 (88 y.o. F) Treating RN: Carlene Coria Primary Care Kynzley Dowson: Derinda Late Other Clinician: Referring Seleta Hovland: Derinda Late Treating Sherlene Rickel/Extender: Tito Dine in Treatment: 0 Electronic Signature(s) Signed: 11/11/2021 10:08:44 AM By: Carlene Coria RN Entered By: Carlene Coria on 11/11/2021 09:16:03 [name], [name] ([id number]) -------------------------------------------------------------------------------- Multi Wound Chart Details [name], [name] Date of Service: 11/11/2021 8:45 AM Medical Record Number: [id number] Patient Account Number: [id number] Date of Birth/Sex: January 04, 1926 (88 y.o. F) Treating RN: Carlene Coria Primary Care Richards Pherigo: Derinda Late Other Clinician: Referring Shevelle Smither: Derinda Late Treating Patria Warzecha/Extender: Tito Dine in Treatment: 0 Vital Signs Height(in): 60 Pulse(bpm): 103 Weight(lbs): 72 Blood Pressure(mmHg): 159/92 Body Mass Index(BMI): 18 Temperature(F): 97.9 Respiratory Rate(breaths/min): 16 Photos: [N/A:N/A] Wound Location: Left, Anterior Upper Arm Left, Distal, Anterior Upper Arm N/A Wounding Event: Hematoma Hematoma N/A Primary Etiology: Trauma, Other Trauma, Other N/A Comorbid History: Cataracts, Deep Vein Thrombosis, Cataracts, Deep Vein Thrombosis, N/A Hypertension, Osteoarthritis, Hypertension, Osteoarthritis, Dementia Dementia Date Acquired: 10/06/2021 10/06/2021 N/A Weeks of Treatment: 0 0 N/A Wound Status: Open Open N/A Measurements L x W x D (cm) 4.7x1.5x0.5 0.7x0.4x0.1 N/A Area (cm) : 5.537 0.22 N/A Volume (cm) : 2.769 0.022 N/A Starting Position 1 (o'clock): 6 Ending Position 1  (o'clock): 12 Maximum Distance 1 (cm): 3 Undermining: Yes No N/A Classification: Full Thickness Without Exposed Full Thickness Without Exposed N/A Support Structures Support Structures Exudate Amount: Medium Medium N/A Exudate Type: Serosanguineous Serosanguineous N/A Exudate Color: red, brown red, brown N/A Granulation Amount: Large (67-100%) Large (67-100%) N/A Granulation Quality: Red Hyper-granulation N/A Necrotic Amount: Small (1-33%) None Present (0%) N/A Exposed Structures: Fat Layer (Subcutaneous Tissue): Fascia: No N/A Yes Fat Layer (Subcutaneous Tissue): Fascia: No No Tendon: No Tendon: No Muscle: No Muscle: No Joint: No Joint: No Bone: No Bone: No Epithelialization: None None N/A Procedures Performed: N/A CHEM CAUT GRANULATION TISS N/A Treatment Notes Electronic Signature(s) Signed: 11/11/2021 3:34:05 PM By: Linton Ham MD Entered By: Linton Ham on 11/11/2021 09:35:58 [name], [name] ([id number]) ZAYNA, TOSTE ([id number]) -------------------------------------------------------------------------------- Multi-Disciplinary Care Plan Details [name], MOE. Date  of Service: 11/11/2021 8:45 AM Medical Record Number: [id number] Patient Account Number: [id number] Date of Birth/Sex: 07-01-1926 (88 y.o. F) Treating RN: Carlene Coria Primary Care Maretta Overdorf: Derinda Late Other Clinician: Referring Alexyss Balzarini: Derinda Late Treating Kasaundra Fahrney/Extender: Tito Dine in Treatment: 0 Active Inactive Wound/Skin Impairment Nursing Diagnoses: Knowledge deficit related to ulceration/compromised skin integrity Goals: Patient/caregiver will verbalize understanding of skin care regimen Date Initiated: 11/11/2021 Target Resolution Date: 11/11/2021 Goal Status: Active Ulcer/skin breakdown will have a volume reduction of 30% by week 4 Date Initiated: 11/11/2021 Target Resolution Date: 12/12/2021 Goal Status: Active Ulcer/skin breakdown  will have a volume reduction of 50% by week 8 Date Initiated: 11/11/2021 Target Resolution Date: 01/09/2022 Goal Status: Active Ulcer/skin breakdown will have a volume reduction of 80% by week 12 Date Initiated: 11/11/2021 Target Resolution Date: 02/09/2022 Goal Status: Active Ulcer/skin breakdown will heal within 14 weeks Date Initiated: 11/11/2021 Target Resolution Date: 03/11/2022 Goal Status: Active Interventions: Assess patient/caregiver ability to obtain necessary supplies Assess patient/caregiver ability to perform ulcer/skin care regimen upon admission and as needed Assess ulceration(s) every visit Notes: Electronic Signature(s) Signed: 11/11/2021 10:08:44 AM By: Carlene Coria RN Entered By: Carlene Coria on 11/11/2021 09:22:41 [name], [name] ([id number]) -------------------------------------------------------------------------------- Pain Assessment Details [name]. Date of Service: 11/11/2021 8:45 AM Medical Record Number: [id number] Patient Account Number: [id number] Date of Birth/Sex: 1926-03-28 (88 y.o. F) Treating RN: Carlene Coria Primary Care Ravi Tuccillo: Derinda Late Other Clinician: Referring Jean Skow: Derinda Late Treating Ellarose Brandi/Extender: Tito Dine in Treatment: 0 Active Problems Location of Pain Severity and Description of Pain Patient Has Paino Yes Site Locations Duration of the Pain. Constant / Intermittento Intermittent How Long Does it Lasto Hours: Minutes: 10 Rate the pain. Current Pain Level: 3 Worst Pain Level: 5 Least Pain Level: 0 Tolerable Pain Level: 4 Character of Pain Describe the Pain: Aching, Burning Pain Management and Medication Current Pain Management: Medication: Yes Cold Application: No Rest: Yes Massage: No Activity: No T.E.N.S.: No Heat Application: No Leg drop or elevation: No Is the Current Pain Management Adequate: Inadequate How does your wound impact your activities of daily  livingo Sleep: Yes Bathing: No Appetite: No Relationship With Others: No Bladder Continence: No Emotions: No Bowel Continence: No Work: No Toileting: No Drive: No Dressing: No Hobbies: No Electronic Signature(s) Signed: 11/11/2021 10:08:44 AM By: Carlene Coria RN Entered By: Carlene Coria on 11/11/2021 08:57:05 [name], [name] ([id number]) -------------------------------------------------------------------------------- Patient/Caregiver Education Details [name] Date of Service: 11/11/2021 8:45 AM Medical Record Number: [id number] Patient Account Number: [id number] Date of Birth/Gender: [birth year] (88 y.o. F) Treating RN: Carlene Coria Primary Care Physician: Derinda Late Other Clinician: Referring Physician: Derinda Late Treating Physician/Extender: Tito Dine in Treatment: 0 Education Assessment Education Provided To: Patient Education Topics Provided Wound/Skin Impairment: Methods: Explain/Verbal Responses: State content correctly Electronic Signature(s) Signed: 11/11/2021 10:08:44 AM By: Carlene Coria RN Entered By: Carlene Coria on 11/11/2021 78:24:23 [name] ([id number]) -------------------------------------------------------------------------------- Wound Assessment Details Patient Name: Thomes Lolling C. Date of Service: 11/11/2021 8:45 AM Medical Record Number: [id number] Patient Account Number: [id number] Date of Birth/Sex: [birth year] (88 y.o. F) Treating RN: Carlene Coria Primary Care Dacota Devall: Derinda Late Other Clinician: Referring Lateefah Mallery: Derinda Late Treating Kortne All/Extender: Tito Dine in Treatment: 0 Wound Status Wound Number: 4 Primary Trauma, Other Etiology: Wound Location: Left, Anterior Upper Arm Wound Status: Open Wounding Event: Hematoma Comorbid Cataracts, Deep Vein Thrombosis, Hypertension, Date Acquired: 10/06/2021 History: Osteoarthritis, Dementia Weeks Of Treatment:  0 Clustered  Wound: No Photos Wound Measurements Length: (cm) 4.7 Width: (cm) 1.5 Depth: (cm) 0.5 Area: (cm) 5.537 Volume: (cm) 2.769 % Reduction in Area: % Reduction in Volume: Epithelialization: None Tunneling: No Undermining: Yes Starting Position (o'clock): 6 Ending Position (o'clock): 12 Maximum Distance: (cm) 3 Wound Description Classification: Full Thickness Without Exposed Support Structu Exudate Amount: Medium Exudate Type: Serosanguineous Exudate Color: red, brown res Foul Odor After Cleansing: No Slough/Fibrino Yes Wound Bed Granulation Amount: Large (67-100%) Exposed Structure Granulation Quality: Red Fascia Exposed: No Necrotic Amount: Small (1-33%) Fat Layer (Subcutaneous Tissue) Exposed: Yes Necrotic Quality: Adherent Slough Tendon Exposed: No Muscle Exposed: No Joint Exposed: No Bone Exposed: No Treatment Notes Wound #4 (Upper Arm) Wound Laterality: Left, Anterior Cleanser Millis, Bula C. ([id number]) Peri-Wound Care Topical Primary Dressing Silvercel 4 1/4x 4 1/4 (in/in) Discharge Instruction: pack lightly into undermining 6 oclock to 12 oclock Secondary Dressing ABD Pad 5x9 (in/in) Discharge Instruction: Cover with ABD pad Kerlix 4.5 x 4.1 (in/yd) Discharge Instruction: Apply Kerlix 4.5 x 4.1 (in/yd) as instructed Secured With 15M Medipore H Soft Cloth Surgical Tape, 2x2 (in/yd) Compression Wrap Compression Stockings Add-Ons ace wrap and geri sleeve Electronic Signature(s) Signed: 11/11/2021 10:08:44 AM By: Carlene Coria RN Entered By: Carlene Coria on 11/11/2021 09:14:01 Birnie, [name] ([id number]) -------------------------------------------------------------------------------- Wound Assessment Details [name]. Date of Service: 11/11/2021 8:45 AM Medical Record Number: [id number] Patient Account Number: [id number] Date of Birth/Sex: 1926-08-27 (88 y.o. F) Treating RN: Carlene Coria Primary Care Loveda Colaizzi:  Derinda Late Other Clinician: Referring Taneisha Fuson: Derinda Late Treating Keldrick Pomplun/Extender: Tito Dine in Treatment: 0 Wound Status Wound Number: 5 Primary Trauma, Other Etiology: Wound Location: Left, Distal, Anterior Upper Arm Wound Status: Open Wounding Event: Hematoma Comorbid Cataracts, Deep Vein Thrombosis, Hypertension, Date Acquired: 10/06/2021 History: Osteoarthritis, Dementia Weeks Of Treatment: 0 Clustered Wound: No Photos Wound Measurements Length: (cm) 0.7 Width: (cm) 0.4 Depth: (cm) 0.1 Area: (cm) 0.22 Volume: (cm) 0.022 % Reduction in Area: % Reduction in Volume: Epithelialization: None Tunneling: No Undermining: No Wound Description Classification: Full Thickness Without Exposed Support Structures Exudate Amount: Medium Exudate Type: Serosanguineous Exudate Color: red, brown Foul Odor After Cleansing: No Slough/Fibrino No Wound Bed Granulation Amount: Large (67-100%) Exposed Structure Granulation Quality: Hyper-granulation Fascia Exposed: No Necrotic Amount: None Present (0%) Fat Layer (Subcutaneous Tissue) Exposed: No Tendon Exposed: No Muscle Exposed: No Joint Exposed: No Bone Exposed: No Treatment Notes Wound #5 (Upper Arm) Wound Laterality: Left, Anterior, Distal Cleanser Peri-Wound Care Topical Primary Dressing Favata, Jayanna C. ([id number]) Silvercel 4 1/4x 4 1/4 (in/in) Discharge Instruction: Apply Silvercel 4 1/4x 4 1/4 (in/in) as instructed Secondary Dressing ABD Pad 5x9 (in/in) Discharge Instruction: Cover with ABD pad Kerlix 4.5 x 4.1 (in/yd) Discharge Instruction: Apply Kerlix 4.5 x 4.1 (in/yd) as instructed Secured With 15M Medipore H Soft Cloth Surgical Tape, 2x2 (in/yd) Compression Wrap Compression Stockings Add-Ons ace wrap and geri sleeve Electronic Signature(s) Signed: 11/11/2021 10:08:44 AM By: Carlene Coria RN Entered By: Carlene Coria on 11/11/2021 09:15:42 Longanecker, [name]  ([id number]) -------------------------------------------------------------------------------- Vitals Details [name] Date of Service: 11/11/2021 8:45 AM Medical Record Number: [id number] Patient Account Number: [id number] Date of Birth/Sex: October 23, 1926 (88 y.o. F) Treating RN: Carlene Coria Primary Care Yaqub Arney: Derinda Late Other Clinician: Referring Rayneisha Bouza: Derinda Late Treating Brain Honeycutt/Extender: Tito Dine in Treatment: 0 Vital Signs Time Taken: 08:57 Temperature (F): 97.9 Height (in): 60 Pulse (bpm): 103 Source: Stated Respiratory Rate (breaths/min): 16 Weight (lbs): 94 Blood Pressure (mmHg): 159/92 Source: Stated  Reference Range: 80 - 120 mg / dl Body Mass Index (BMI): 18.4 Electronic Signature(s) Signed: 11/11/2021 10:08:44 AM By: Carlene Coria RN Entered By: Carlene Coria on 11/11/2021 08:57:59

## 2021-11-11 NOTE — Progress Notes (Signed)
Brittany, [name] ([id number]) Visit Report for 11/11/2021 Chief Complaint Document Details [name], [name]. Date of Service: 11/11/2021 8:45 AM Medical Record Number: [id number] Patient Account Number: [id number] Date of Birth/Sex: May 07, 1926 (88 y.o. F) Treating RN: Carlene Coria Primary Care Provider: Derinda Late Other Clinician: Referring Provider: Derinda Late Treating Provider/Extender: Tito Dine in Treatment: 0 Information Obtained from: Patient Chief Complaint Left anterior shin ulcer 01/28/2020; patient is here for review of wound on her right anterior lower shin 11/11/2021; the patient is here for review of wound secondary to a hematoma on the left forearm Electronic Signature(s) Signed: 11/11/2021 3:34:05 PM By: Linton Ham MD Entered By: Linton Ham on 11/11/2021 09:36:36 [place]ido, [name] ([id number]) -------------------------------------------------------------------------------- HPI Details [name]. Date of Service: 11/11/2021 8:45 AM Medical Record Number: [id number] Patient Account Number: [id number] Date of Birth/Sex: [birth year] (88 y.o. F) Treating RN: Carlene Coria Primary Care Provider: Derinda Late Other Clinician: Referring Provider: Derinda Late Treating Provider/Extender: Tito Dine in Treatment: 0 History of Present Illness HPI Description: 06/06/17 on evaluation today patient presents for evaluation concerning an ulcer which she initially had arise as a result of striking her left lower extremity money bed frame. With that being said this was roughly one month ago and unfortunately the wound despite two courses of Keflex have not really made a dramatic improvement. This has continued to drain as well as being exquisitely tender at times. Even to the point that she is having a difficult time walking. Patient does have chronic atrial fibrillation which subsequently leads to her being  on long-term anticoagulant therapy and this causes ED bruising which may have contributed to this entry and where things are at this point. She does have valvular heart disease as well as hypertension and her last INR was 2.7. Currently Neosporin, Gauls, and an ace wrap has been utilized. Keflex is the antibiotic that she has been on. Patient has not had a culture up to this point and she states that her blood pressure at the primary care office yesterday was 138/72. Her pain is significant related to be an eight out of 10. No fevers, chills, nausea, or vomiting noted at this time. 06/20/17; patient was admitted here 2 weeks ago. She had a traumatic wound on her left lateral lower leg probably in the setting of some degree of venous insufficiency with surrounding cellulitis. She had been on Keflex, after we saw her she was put on doxycycline which she apparently did not tolerate. Culture that was done at the time showed Morganella which was resistant to Keflex. Apparently the patient continued to take Keflex after the appointment. She is also on Coumadin. I had tried to change her antibiotic when the Morganella culture was brought to my attention however apparently our staff could not reach the patient to inform them of the antibiotic change 06/26/17; patient has wound on her left lateral lower extremity in the setting of some degree of venous insufficiency. I gave her cefdinir last week and she is completing this. There is no evidence of surrounding infection. Aggressive debridement last week, wound bed looks healthier. We've been using Aquacel. They're traveling in the [place] next week we'll see her back in 2 weeks unless there are problems. 07/10/17; the patient is been using Aquacel Ag her husband is changing this with Kerlix and conform. She still complains of a lot of pain. Apparently with the antibiotics. We gave 2 or 3 weeks ago. Her INR went up  to 6, this was managed by her primary  physician 07/17/17; she is using Aquacel Ag and a border foam. Still discomfort although dimensions are better. 07/24/17; patient or for review of a trauma wound on her left anterior leg in the setting of some degree of chronic venous insufficiency she has been using Aquacel Ag and a border foam and making progress. 07/31/17; only a small open area of this original significant trauma is still open. Her husband is changing this every second day [Aquacel Ag] Readmission: 01/14/19 on evaluation today patient is that she seen for initial inspection during the office visit today concerning a trauma/skin tear to the left anterior lower extremity. This is roughly the same region where she was previously treated and years past when she was here in the clinic. Nonetheless upon evaluation today the patient's wound actually showed signs of decent granulation around the edge although unfortunately the skin flap that was torn back did fold under on itself and the flap itself is dying. She does have some eschar surrounded the edges of the wound at this point. Nonetheless this also was very tender for her. The patient does have a history of hypertension, atrial fibrillation, and long-term use anticoagulant therapy. 01/21/19 on evaluation today patient's wound on the anterior portion of her lower extremity appears to still be necrotic as far as the surface of the wound is concerned. Unfortunately there is no significant improvement at this point compared to last week they were not able to get the Santyl that are using Medihoney and maybe one reason why. The other is I think this may be stained to drive. Possibly adding something such as mepitel over top of the Medihoney may help to loosen this up quite a bit which I think would be in her best interest. Also she still has a lot of erythema and is very tender to touch I'm afraid that we may need to switch to a different antibiotic to see if this will be of benefit there  still really nothing that I can culture to get a better picture of what's going on and what we need to do from the standpoint of antibiotics. I do believe the patient needs a referral Talmuds me to vascular in order to evaluate her blood flow based on what I'm seeing they will be able to perform TBI testing if nothing else along with the vascular evaluation to see if there's any evidence for limited blood flow to this lower extremity. 4/8; I have reviewed the patient's arterial studies and they are really quite good. There should be no arterial impediments to healing the wound on the right anterior tibial area. 4/15; patient has a small open area over the left mid tibial area. Still requiring debridement we are using Santyl here and Medihoney at home [Santyl unaffordable] 4/22; difficult, presumably traumatic area over the left mid tibial area in the setting of chronic venous insufficiency. We have been using Santyl here and meta honey at home. The wound surface is getting gradually better and slight improvement in dimensions. Her husband is changing this daily 4/29; left mid tibia presumably traumatic wound in the setting of chronic venous insufficiency. We have been using Santyl and a combination of Medihoney at home. Arrives today with a much better looking healthy granulated surface. We will change the primary dressing to [facility] 5/6; left mid tibia smaller reasonably healthy looking wound using Hydrofera Blue since last week 5/20-Patient returns to clinic with a left mid tibial wound looking  better, we have been using Hydrofera Blue since the previous week 5/27; patient returns to clinic with a left mid tibial wound covered in raised eschar. We have been using Hydrofera Blue her husband changes the dressings using a border foam cover READMISSION 01/28/20 Mrs. Yeater is a patient who is now 88 years old. As usual she comes in with her husband who is her primary caregiver. They were here  last from March through May 2020 with a traumatic wound on the left anterior tibia we eventually heal this out. She has chronic venous insufficiency. She is also here previously in 2018. Her current problem started 8 days ago. She went in the hospital for a thoracic level kyphoplasty. The procedure she developed a skin tear on her leg. This was mentioned to her. Her husband is applying Medihoney to the area and they are in for our review of this. She finds this much too uncomfortable to attempt ABIs although her ABIs when she was in the clinic last year were quite normal bilaterally Bos, Khamille C. ([id number]) Past medical history she is not a diabetic. As noted she had a T9 and T6 kyphoplasty, Alzheimer's disease, chronic atrial fibrillation on Coumadin and hypertension 4/14; patient arrives in clinic having removed part of the wrap. We attempted to leave this on all week. Unfortunately none that none of the tissue that looks semiviable last week has remained viable. She required an extensive debridement. We have been using silver alginate 4/21; the patient arrived today with tremendous erythema around the large part of the wound on the right lateral leg. This extends medially and down towards her foot. I marked the area but I think the extent of the likely cellulitis is beyond what can be safely managed as an outpatient. 02/23/2020 upon evaluation today patient is seen today though she is typically followed by Dr. Dellia Nims. Last time he saw her he sent her to the hospital and she subsequently was in the hospital for some time. In fact it was from 02/11/2020 through 02/19/2020. During that time she was on antibiotic therapy by way of IV antibiotics. Her leg does appear to be doing better today compared to what we saw then. Nonetheless she had a culture that was obtained while she was in the hospital by Dr. Steva Ready. This ended up not growing anything which is not really surprising considering she  was on antibiotics which were fairly strong at the time of culture. There was no growth at all after 2 days. With that being said Dr. Steva Ready apparently discussed with Dr. Dellia Nims per her note anyway that she wanted the patient off of the antibiotics for a week and then to repeat a culture in the outpatient setting. She also has a suspicion for the possibility of pyoderma and has referred the patient to Dr. Nehemiah Massed and the patient is supposed to be seeing him sometime I believe Thursday of this week. Nonetheless I explained that pyoderma can sometimes begin as a injury or insult that then worsens. Obviously the initial injury here appears to have been a scraping of her leg that occurred when the patient was being transferred off of the table after a kyphoplasty procedure. Fortunately again as mentioned the infection seems to be doing significantly better based on what I am seeing at this point compared to previous pictures that I reviewed today. 5/5; the patient is back to my clinic today after seeing with stone 2 days ago. She is on [employer] with a Kerlix Coban I  have reviewed her recent history. We had sent her to the hospital with presumed cellulitis considerable erythema in the lateral leg. She received prolonged courses of antibiotics in the hospital. I received a call from one of the hospitalist to report that he didn't feel that she was responding to antibiotics although as noted 2 days ago by Jeri Cos the wound actually looks a lot better along with the periwound skin which is not nearly as erythematous. He raised the possibility of pyoderma gangrenosum which also had been brought up by Dr. Steva Ready the infectious disease consultant. The discharge culture was negative but she was still on antibiotics The pyoderma gangrenosum apparently has something to do with the fact that both her sons have Crohn's disease although the patient does not have Crohn's disease. I told the doctor that  call me I didn't really think this fit with the history which was a trauma induced wound why the patient was having a kyphoplasty. In fact her husband is still very angry about this with the hospital. She also has severe chronic venous insufficiency with hemosiderin and stasis dermatitis and has had wounds in both legs. 5/12; venous insufficiency wound on the right lower lateral lower leg initially trauma. Still debris on the surface of this wound that undergoes a difficult debridement. We have been using Hydrofera Blue 5/19; venous insufficiency wound on the right lower lateral leg initially trauma. Better looking wound surface and a rim of epithelialization. Surface area is smaller we have been using Hydrofera Blue under compression 03/17/20-Patient returns at 1 week, the wound is looking stable, dimensions are slightly smaller, continue Hydrofera Blue and Santyl under compression 6/2; venous insufficiency wound on the right lower leg initially traumatic. She has a considerably better looking wound surface than when I saw this 2 weeks ago. Some hyper granulation. We have been using Santyl with [facility] cover. I no longer think the Santyl is necessary we will just use Hydrofera Blue kerlix Coban compression They are going to be traveling including to the [facility] and then subsequently a family vacation in [place]. Her daughter-in-law stated that they would be able to get her in here in 2 weeks before they go to [place] I think that would be good plan. We will show her how to do the dressing including the Kerlix Coban 6/9; venous insufficiency wound on the right lower leg initially traumatic. Complicated by infection requiring hospitalization. Since then she has done very nicely and the wound has been getting progressively smaller we have been using Hydrofera Blue under Kerlix and Coban 6/16; venous insufficiency wound on the right lower leg initially traumatic. This was  complicated by an infection with persistent cellulitis requiring a fairly protracted hospitalization. We are using Hydrofera Blue under Kerlix and Coban. The wound is contracting nicely. 6/30; venous insufficiency wound on the right lateral lower leg which was initially traumatic in the hospital has healed. Unfortunately the patient was vacationing in [place] last week had 2 falls in quick succession and by description of her daughter who is with her today she has a bimalleolar fracture in the right ankle. She is seen in [place]. She has a soft brace with an Ace wrap. The patient has dementia making weightbearing restrictions difficult. READMISSION 11/11/2021 This is a now 88 year old woman that we have seen in the past in this clinic for predominantly chronic venous insufficiency wounds on her lower extremities. On this occasion in mid December she suffered an assisted fall and as she was  on Coumadin developed a hematoma. She was hospitalized from 12/15 to 10/11/2021 with a laceration injury to the left arm. She was treated with IV antibiotics. Notable that her hemoglobin dropped from 14.8-10.8 and if that was all in the arm that would have been a sizable amount of blood. Her daughter says that her arm was black from the wrist up to just above where her wound is now. Ultimately the eschar has come off and there has been drainage but all the blood has been evacuated. They have been using Xeroform gauze, Geri sleeves and an Ace wrap. The patient has dementia and will tend to pick at dressings. They have home health changing the dressing 2 times a week [advanced] The patient is no longer on Coumadin and this was discontinued. She has very significant dementia which is much more advanced than I remember in 2021 even. She has tricuspid regurgitation mitral regurgitation and hypertension. Electronic Signature(s) Signed: 11/11/2021 3:34:05 PM By: Linton Ham MD Entered By: Linton Ham on  11/11/2021 09:40:25 [place], [name] ([id number]) -------------------------------------------------------------------------------- Otelia Sergeant TISS Details [name] Date of Service: 11/11/2021 8:45 AM Medical Record Number: [id number] Patient Account Number: [id number] Date of Birth/Sex: October 11, 1926 (88 y.o. F) Treating RN: Carlene Coria Primary Care Provider: Derinda Late Other Clinician: Referring Provider: Derinda Late Treating Provider/Extender: Tito Dine in Treatment: 0 Procedure Performed for: Wound #5 Left,Distal,Anterior Upper Arm Performed By: Physician Ricard Dillon, MD Post Procedure Diagnosis Same as Pre-procedure Electronic Signature(s) Signed: 11/11/2021 3:34:05 PM By: Linton Ham MD Entered By: Linton Ham on 11/11/2021 09:36:07 [name] ([id number]) -------------------------------------------------------------------------------- Physical Exam Details Patient Name: LEEONA, MCCARDLE. Date of Service: 11/11/2021 8:45 AM Medical Record Number: [id number] Patient Account Number: [id number] Date of Birth/Sex: Jul 12, 1926 (88 y.o. F) Treating RN: Carlene Coria Primary Care Provider: Derinda Late Other Clinician: Referring Provider: Derinda Late Treating Provider/Extender: Tito Dine in Treatment: 0 Constitutional Patient is hypertensive.. Pulse regular and within target range for patient.Marland Kitchen Respirations regular, non-labored and within target range.. Temperature is normal and within the target range for the patient.Marland Kitchen appears in no distress. Psychiatric Moderate to severe dementia. Notes Wound exam; left dorsal forearm towards the lateral aspect of the arm. There is the major wound in the mid aspect and a smaller hyper granulated wound distally. The dorsal forearm major wound has reasonably healthy looking granulation however there is a 3 cm tunnel superiorly and a significant amount of  undermining laterally I do not think they were specifically addressing this. There is no evidence of infection o She has a small hyper granulated wound more towards her wrist I used silver nitrate on this Electronic Signature(s) Signed: 11/11/2021 3:34:05 PM By: Linton Ham MD Entered By: Linton Ham on 11/11/2021 09:41:55 Deller, [name] ([id number]) -------------------------------------------------------------------------------- Physician Orders Details [name]. Date of Service: 11/11/2021 8:45 AM Medical Record Number: [id number] Patient Account Number: [id number] Date of Birth/Sex: November 30, 1925 (88 y.o. F) Treating RN: Carlene Coria Primary Care Provider: Derinda Late Other Clinician: Referring Provider: Derinda Late Treating Provider/Extender: Tito Dine in Treatment: 0 Verbal / Phone Orders: No Diagnosis Coding Follow-up Appointments o Return Appointment in 1 week. [place] for wound care. May utilize formulary equivalent dressing for wound treatment orders unless otherwise specified. Home Health Nurse may visit PRN to address patientos wound care needs. - [facility] Bathing/ Shower/ Hygiene o May shower; gently cleanse wound with antibacterial soap, rinse and  pat dry prior to dressing wounds Wound Treatment Wound #4 - Upper Arm Wound Laterality: Left, Anterior Primary Dressing: Silvercel 4 1/4x 4 1/4 (in/in) 3 x Per Week/30 Days Discharge Instructions: pack lightly into undermining 6 oclock to 12 oclock Secondary Dressing: ABD Pad 5x9 (in/in) 3 x Per Week/30 Days Discharge Instructions: Cover with ABD pad Secondary Dressing: Kerlix 4.5 x 4.1 (in/yd) 3 x Per Week/30 Days Discharge Instructions: Apply Kerlix 4.5 x 4.1 (in/yd) as instructed Secured With: 90M Medipore H Soft Cloth Surgical Tape, 2x2 (in/yd) 3 x Per Week/30 Days Add-Ons: ace wrap and geri sleeve 3 x Per Week/30 Days Wound #5 -  Upper Arm Wound Laterality: Left, Anterior, Distal Primary Dressing: Silvercel 4 1/4x 4 1/4 (in/in) 3 x Per Week/30 Days Discharge Instructions: Apply Silvercel 4 1/4x 4 1/4 (in/in) as instructed Secondary Dressing: ABD Pad 5x9 (in/in) 3 x Per Week/30 Days Discharge Instructions: Cover with ABD pad Secondary Dressing: Kerlix 4.5 x 4.1 (in/yd) 3 x Per Week/30 Days Discharge Instructions: Apply Kerlix 4.5 x 4.1 (in/yd) as instructed Secured With: 90M Medipore H Soft Cloth Surgical Tape, 2x2 (in/yd) 3 x Per Week/30 Days Add-Ons: ace wrap and geri sleeve 3 x Per Week/30 Days Electronic Signature(s) Signed: 11/11/2021 10:08:44 AM By: Carlene Coria RN Signed: 11/11/2021 3:34:05 PM By: Linton Ham MD Entered By: Carlene Coria on 11/11/2021 09:39:59 [place], [name] ([id number]) -------------------------------------------------------------------------------- Problem List Details [name]. Date of Service: 11/11/2021 8:45 AM Medical Record Number: [id number] Patient Account Number: [id number] Date of Birth/Sex: 1926-09-08 (88 y.o. F) Treating RN: Carlene Coria Primary Care Provider: Derinda Late Other Clinician: Referring Provider: Derinda Late Treating Provider/Extender: Tito Dine in Treatment: 0 Active Problems ICD-10 Encounter Code Description Active Date MDM Diagnosis S40.022D Contusion of left upper arm, subsequent encounter 11/11/2021 No Yes L98.498 Non-pressure chronic ulcer of skin of other sites with other specified 11/11/2021 No Yes severity Inactive Problems Resolved Problems Electronic Signature(s) Signed: 11/11/2021 3:34:05 PM By: Linton Ham MD Entered By: Linton Ham on 11/11/2021 09:32:17 [place], [name] ([id number]) -------------------------------------------------------------------------------- Progress Note Details [name]. Date of Service: 11/11/2021 8:45 AM Medical Record Number:  [id number] Patient Account Number: [id number] Date of Birth/Sex: 03/16/1926 (88 y.o. F) Treating RN: Carlene Coria Primary Care Provider: Derinda Late Other Clinician: Referring Provider: Derinda Late Treating Provider/Extender: Tito Dine in Treatment: 0 Subjective Chief Complaint Information obtained from Patient Left anterior shin ulcer 01/28/2020; patient is here for review of wound on her right anterior lower shin 11/11/2021; the patient is here for review of wound secondary to a hematoma on the left forearm History of Present Illness (HPI) 06/06/17 on evaluation today patient presents for evaluation concerning an ulcer which she initially had arise as a result of striking her left lower extremity money bed frame. With that being said this was roughly one month ago and unfortunately the wound despite two courses of Keflex have not really made a dramatic improvement. This has continued to drain as well as being exquisitely tender at times. Even to the point that she is having a difficult time walking. Patient does have chronic atrial fibrillation which subsequently leads to her being on long-term anticoagulant therapy and this causes ED bruising which may have contributed to this entry and where things are at this point. She does have valvular heart disease as well as hypertension and her last INR was 2.7. Currently Neosporin, Gauls, and an ace wrap has been utilized. Keflex is the antibiotic that  she has been on. Patient has not had a culture up to this point and she states that her blood pressure at the primary care office yesterday was 138/72. Her pain is significant related to be an eight out of 10. No fevers, chills, nausea, or vomiting noted at this time. 06/20/17; patient was admitted here 2 weeks ago. She had a traumatic wound on her left lateral lower leg probably in the setting of some degree of venous insufficiency with surrounding cellulitis. She had been on Keflex,  after we saw her she was put on doxycycline which she apparently did not tolerate. Culture that was done at the time showed Morganella which was resistant to Keflex. Apparently the patient continued to take Keflex after the appointment. She is also on Coumadin. I had tried to change her antibiotic when the Morganella culture was brought to my attention however apparently our staff could not reach the patient to inform them of the antibiotic change 06/26/17; patient has wound on her left lateral lower extremity in the setting of some degree of venous insufficiency. I gave her cefdinir last week and she is completing this. There is no evidence of surrounding infection. Aggressive debridement last week, wound bed looks healthier. We've been using Aquacel. They're traveling in the [place] next week we'll see her back in 2 weeks unless there are problems. 07/10/17; the patient is been using Aquacel Ag her husband is changing this with Kerlix and conform. She still complains of a lot of pain. Apparently with the antibiotics. We gave 2 or 3 weeks ago. Her INR went up to 6, this was managed by her primary physician 07/17/17; she is using Aquacel Ag and a border foam. Still discomfort although dimensions are better. 07/24/17; patient or for review of a trauma wound on her left anterior leg in the setting of some degree of chronic venous insufficiency she has been using Aquacel Ag and a border foam and making progress. 07/31/17; only a small open area of this original significant trauma is still open. Her husband is changing this every second day [Aquacel Ag] Readmission: 01/14/19 on evaluation today patient is that she seen for initial inspection during the office visit today concerning a trauma/skin tear to the left anterior lower extremity. This is roughly the same region where she was previously treated and years past when she was here in the clinic. Nonetheless upon evaluation today the patient's wound  actually showed signs of decent granulation around the edge although unfortunately the skin flap that was torn back did fold under on itself and the flap itself is dying. She does have some eschar surrounded the edges of the wound at this point. Nonetheless this also was very tender for her. The patient does have a history of hypertension, atrial fibrillation, and long-term use anticoagulant therapy. 01/21/19 on evaluation today patient's wound on the anterior portion of her lower extremity appears to still be necrotic as far as the surface of the wound is concerned. Unfortunately there is no significant improvement at this point compared to last week they were not able to get the Santyl that are using Medihoney and maybe one reason why. The other is I think this may be stained to drive. Possibly adding something such as mepitel over top of the Medihoney may help to loosen this up quite a bit which I think would be in her best interest. Also she still has a lot of erythema and is very tender to touch I'm afraid that we may need  to switch to a different antibiotic to see if this will be of benefit there still really nothing that I can culture to get a better picture of what's going on and what we need to do from the standpoint of antibiotics. I do believe the patient needs a referral Talmuds me to vascular in order to evaluate her blood flow based on what I'm seeing they will be able to perform TBI testing if nothing else along with the vascular evaluation to see if there's any evidence for limited blood flow to this lower extremity. 4/8; I have reviewed the patient's arterial studies and they are really quite good. There should be no arterial impediments to healing the wound on the right anterior tibial area. 4/15; patient has a small open area over the left mid tibial area. Still requiring debridement we are using Santyl here and Medihoney at home [Santyl unaffordable] 4/22; difficult, presumably  traumatic area over the left mid tibial area in the setting of chronic venous insufficiency. We have been using Santyl here and meta honey at home. The wound surface is getting gradually better and slight improvement in dimensions. Her husband is changing this daily 4/29; left mid tibia presumably traumatic wound in the setting of chronic venous insufficiency. We have been using Santyl and a combination of Medihoney at home. Arrives today with a much better looking healthy granulated surface. We will change the primary dressing to [facility] 5/6; left mid tibia smaller reasonably healthy looking wound using Hydrofera Blue since last week 5/20-Patient returns to clinic with a left mid tibial wound looking better, we have been using Hydrofera Blue since the previous week 5/27; patient returns to clinic with a left mid tibial wound covered in raised eschar. We have been using Hydrofera Blue her husband changes the dressings using a border foam cover READMISSION 01/28/20 Mrs. Knoch is a patient who is now 88 years old. As usual she comes in with her husband who is her primary caregiver. They were here last from March through May 2020 with a traumatic wound on the left anterior tibia we eventually heal this out. She has chronic venous insufficiency. She is PATTI, SHORB ([id number]) also here previously in 2018. Her current problem started 8 days ago. She went in the hospital for a thoracic level kyphoplasty. The procedure she developed a skin tear on her leg. This was mentioned to her. Her husband is applying Medihoney to the area and they are in for our review of this. She finds this much too uncomfortable to attempt ABIs although her ABIs when she was in the clinic last year were quite normal bilaterally Past medical history she is not a diabetic. As noted she had a T9 and T6 kyphoplasty, Alzheimer's disease, chronic atrial fibrillation on Coumadin and hypertension 4/14; patient arrives in  clinic having removed part of the wrap. We attempted to leave this on all week. Unfortunately none that none of the tissue that looks semiviable last week has remained viable. She required an extensive debridement. We have been using silver alginate 4/21; the patient arrived today with tremendous erythema around the large part of the wound on the right lateral leg. This extends medially and down towards her foot. I marked the area but I think the extent of the likely cellulitis is beyond what can be safely managed as an outpatient. 02/23/2020 upon evaluation today patient is seen today though she is typically followed by Dr. Dellia Nims. Last time he saw her he sent her to  the hospital and she subsequently was in the hospital for some time. In fact it was from 02/11/2020 through 02/19/2020. During that time she was on antibiotic therapy by way of IV antibiotics. Her leg does appear to be doing better today compared to what we saw then. Nonetheless she had a culture that was obtained while she was in the hospital by Dr. Steva Ready. This ended up not growing anything which is not really surprising considering she was on antibiotics which were fairly strong at the time of culture. There was no growth at all after 2 days. With that being said Dr. Steva Ready apparently discussed with Dr. Dellia Nims per her note anyway that she wanted the patient off of the antibiotics for a week and then to repeat a culture in the outpatient setting. She also has a suspicion for the possibility of pyoderma and has referred the patient to Dr. Nehemiah Massed and the patient is supposed to be seeing him sometime I believe Thursday of this week. Nonetheless I explained that pyoderma can sometimes begin as a injury or insult that then worsens. Obviously the initial injury here appears to have been a scraping of her leg that occurred when the patient was being transferred off of the table after a kyphoplasty procedure. Fortunately again as mentioned  the infection seems to be doing significantly better based on what I am seeing at this point compared to previous pictures that I reviewed today. 5/5; the patient is back to my clinic today after seeing with stone 2 days ago. She is on [employer] with a Kerlix Coban I have reviewed her recent history. We had sent her to the hospital with presumed cellulitis considerable erythema in the lateral leg. She received prolonged courses of antibiotics in the hospital. I received a call from one of the hospitalist to report that he didn't feel that she was responding to antibiotics although as noted 2 days ago by Jeri Cos the wound actually looks a lot better along with the periwound skin which is not nearly as erythematous. He raised the possibility of pyoderma gangrenosum which also had been brought up by Dr. Steva Ready the infectious disease consultant. The discharge culture was negative but she was still on antibiotics The pyoderma gangrenosum apparently has something to do with the fact that both her sons have Crohn's disease although the patient does not have Crohn's disease. I told the doctor that call me I didn't really think this fit with the history which was a trauma induced wound why the patient was having a kyphoplasty. In fact her husband is still very angry about this with the hospital. She also has severe chronic venous insufficiency with hemosiderin and stasis dermatitis and has had wounds in both legs. 5/12; venous insufficiency wound on the right lower lateral lower leg initially trauma. Still debris on the surface of this wound that undergoes a difficult debridement. We have been using Hydrofera Blue 5/19; venous insufficiency wound on the right lower lateral leg initially trauma. Better looking wound surface and a rim of epithelialization. Surface area is smaller we have been using Hydrofera Blue under compression 03/17/20-Patient returns at 1 week, the wound is looking stable,  dimensions are slightly smaller, continue Hydrofera Blue and Santyl under compression 6/2; venous insufficiency wound on the right lower leg initially traumatic. She has a considerably better looking wound surface than when I saw this 2 weeks ago. Some hyper granulation. We have been using Santyl with [facility] cover. I no longer think the [facility]  is necessary we will just use Hydrofera Blue kerlix Coban compression They are going to be traveling including to the [facility] and then subsequently a family vacation in [place]. Her daughter-in-law stated that they would be able to get her in here in 2 weeks before they go to [place] I think that would be good plan. We will show her how to do the dressing including the Kerlix Coban 6/9; venous insufficiency wound on the right lower leg initially traumatic. Complicated by infection requiring hospitalization. Since then she has done very nicely and the wound has been getting progressively smaller we have been using Hydrofera Blue under Kerlix and Coban 6/16; venous insufficiency wound on the right lower leg initially traumatic. This was complicated by an infection with persistent cellulitis requiring a fairly protracted hospitalization. We are using Hydrofera Blue under Kerlix and Coban. The wound is contracting nicely. 6/30; venous insufficiency wound on the right lateral lower leg which was initially traumatic in the hospital has healed. Unfortunately the patient was vacationing in [place] last week had 2 falls in quick succession and by description of her daughter who is with her today she has a bimalleolar fracture in the right ankle. She is seen in [place]. She has a soft brace with an Ace wrap. The patient has dementia making weightbearing restrictions difficult. READMISSION 11/11/2021 This is a now 88 year old woman that we have seen in the past in this clinic for predominantly chronic venous insufficiency wounds on her  lower extremities. On this occasion in mid December she suffered an assisted fall and as she was on Coumadin developed a hematoma. She was hospitalized from 12/15 to 10/11/2021 with a laceration injury to the left arm. She was treated with IV antibiotics. Notable that her hemoglobin dropped from 14.8-10.8 and if that was all in the arm that would have been a sizable amount of blood. Her daughter says that her arm was black from the wrist up to just above where her wound is now. Ultimately the eschar has come off and there has been drainage but all the blood has been evacuated. They have been using Xeroform gauze, Geri sleeves and an Ace wrap. The patient has dementia and will tend to pick at dressings. They have home health changing the dressing 2 times a week [advanced] The patient is no longer on Coumadin and this was discontinued. She has very significant dementia which is much more advanced than I remember in 2021 even. She has tricuspid regurgitation mitral regurgitation and hypertension. Patient History Information obtained from Patient. Allergies No Known Drug Allergies REAGAN, BEHLKE ([id number]) Family History Cancer - Siblings, Hypertension - Father,Siblings, Stroke - Father, No family history of Diabetes, Heart Disease, Kidney Disease, Lung Disease, Seizures, Thyroid Problems, Tuberculosis. Social History Former smoker, Marital Status - Married, Alcohol Use - Daily, Drug Use - No History, Caffeine Use - Never. Medical History Eyes Patient has history of Cataracts - removed Denies history of Glaucoma, Optic Neuritis Ear/Nose/Mouth/Throat Denies history of Chronic sinus problems/congestion Hematologic/Lymphatic Denies history of Anemia, Hemophilia, Human Immunodeficiency Virus, Lymphedema, Sickle Cell Disease Respiratory Denies history of Aspiration, Asthma, Chronic Obstructive Pulmonary Disease (COPD), Pneumothorax, Sleep Apnea, Tuberculosis Cardiovascular Patient has  history of Deep Vein Thrombosis, Hypertension Denies history of Angina, Arrhythmia, Congestive Heart Failure, Coronary Artery Disease, Hypotension, Myocardial Infarction, Peripheral Arterial Disease, Peripheral Venous Disease, Phlebitis, Vasculitis Gastrointestinal Denies history of Cirrhosis , Colitis, Crohn s, Hepatitis A, Hepatitis B, Hepatitis C Endocrine Denies history of Type I Diabetes, Type II Diabetes  Genitourinary Denies history of End Stage Renal Disease Immunological Denies history of Lupus Erythematosus, Raynaud s, Scleroderma Integumentary (Skin) Denies history of History of Burn, History of pressure wounds Musculoskeletal Patient has history of Osteoarthritis - hands Denies history of Gout, Rheumatoid Arthritis, Osteomyelitis Neurologic Patient has history of Dementia Denies history of Neuropathy, Quadriplegia, Paraplegia, Seizure Disorder Oncologic Denies history of Received Chemotherapy, Received Radiation Medical And Surgical History Notes Constitutional Symptoms (General Health) A-Fib; HTN; Valvular heart disease Objective Constitutional Patient is hypertensive.. Pulse regular and within target range for patient.Marland Kitchen Respirations regular, non-labored and within target range.. Temperature is normal and within the target range for the patient.Marland Kitchen appears in no distress. Vitals Time Taken: 8:57 AM, Height: 60 in, Source: Stated, Weight: 94 lbs, Source: Stated, BMI: 18.4, Temperature: 97.9 F, Pulse: 103 bpm, Respiratory Rate: 16 breaths/min, Blood Pressure: 159/92 mmHg. Psychiatric Moderate to severe dementia. General Notes: Wound exam; left dorsal forearm towards the lateral aspect of the arm. There is the major wound in the mid aspect and a smaller hyper granulated wound distally. The dorsal forearm major wound has reasonably healthy looking granulation however there is a 3 cm tunnel superiorly and a significant amount of undermining laterally I do not think they were  specifically addressing this. There is no evidence of infection She has a small hyper granulated wound more towards her wrist I used silver nitrate on this Integumentary (Hair, Skin) Wound #4 status is Open. Original cause of wound was Hematoma. The date acquired was: 10/06/2021. The wound is located on the Left,Anterior Upper Arm. The wound measures 4.7cm length x 1.5cm width x 0.5cm depth; 5.537cm^2 area and 2.769cm^3 volume. There is Fat Layer Schiller, Dara C. ([id number]) (Subcutaneous Tissue) exposed. There is no tunneling noted, however, there is undermining starting at 6:00 and ending at 12:00 with a maximum distance of 3cm. There is a medium amount of serosanguineous drainage noted. There is large (67-100%) red granulation within the wound bed. There is a small (1-33%) amount of necrotic tissue within the wound bed including Adherent Slough. Wound #5 status is Open. Original cause of wound was Hematoma. The date acquired was: 10/06/2021. The wound is located on the Left,Distal,Anterior Upper Arm. The wound measures 0.7cm length x 0.4cm width x 0.1cm depth; 0.22cm^2 area and 0.022cm^3 volume. There is no tunneling or undermining noted. There is a medium amount of serosanguineous drainage noted. There is large (67-100%) hyper - granulation within the wound bed. There is no necrotic tissue within the wound bed. Assessment Active Problems ICD-10 Contusion of left upper arm, subsequent encounter Non-pressure chronic ulcer of skin of other sites with other specified severity Procedures Wound #5 Pre-procedure diagnosis of Wound #5 is a Trauma, Other located on the Left,Distal,Anterior Upper Arm . An CHEM CAUT GRANULATION TISS procedure was performed by Ricard Dillon, MD. Post procedure Diagnosis Wound #5: Same as Pre-Procedure Plan Follow-up Appointments: Return Appointment in 1 week. Home Health: [facility] for wound care. May utilize formulary equivalent dressing for  wound treatment orders unless otherwise specified. Home Health Nurse may visit PRN to address patient s wound care needs. - [facility] Bathing/ Shower/ Hygiene: May shower; gently cleanse wound with antibacterial soap, rinse and pat dry prior to dressing wounds WOUND #4: - Upper Arm Wound Laterality: Left, Anterior Primary Dressing: Silvercel 4 1/4x 4 1/4 (in/in) 3 x Per Week/30 Days Discharge Instructions: pack lightly into undermining 6 oclock to 12 oclock Secondary Dressing: ABD Pad 5x9 (in/in) 3 x Per Week/30 Days  Discharge Instructions: Cover with ABD pad Secondary Dressing: Kerlix 4.5 x 4.1 (in/yd) 3 x Per Week/30 Days Discharge Instructions: Apply Kerlix 4.5 x 4.1 (in/yd) as instructed Secured With: 175M Medipore H Soft Cloth Surgical Tape, 2x2 (in/yd) 3 x Per Week/30 Days Add-Ons: ace wrap and geri sleeve 3 x Per Week/30 Days WOUND #5: - Upper Arm Wound Laterality: Left, Anterior, Distal Primary Dressing: Silvercel 4 1/4x 4 1/4 (in/in) 3 x Per Week/30 Days Discharge Instructions: Apply Silvercel 4 1/4x 4 1/4 (in/in) as instructed Secondary Dressing: ABD Pad 5x9 (in/in) 3 x Per Week/30 Days Discharge Instructions: Cover with ABD pad Secondary Dressing: Kerlix 4.5 x 4.1 (in/yd) 3 x Per Week/30 Days Discharge Instructions: Apply Kerlix 4.5 x 4.1 (in/yd) as instructed Secured With: 175M Medipore H Soft Cloth Surgical Tape, 2x2 (in/yd) 3 x Per Week/30 Days Add-Ons: ace wrap and geri sleeve 3 x Per Week/30 Days 1. For today we packed the wound with silver alginate, they were not previously packing has with anything simply layering Xeroform over the top 2. ABD/kerlix and an Geri sleeve followed by an Ace wrap. Anything to keep her from undressing the wound is important to him this may become problematic. 3. I had some thoughts about Hydrofera Blue however she has home health and for now I went with the silver alginate just to see how this would respond. 4. We are going to ask home  health to come out twice and she will come here once. Her daughter asked about simply allowing home health to change this at home [I suspect there is difficulties arranging for her to be transported]. I told her that at least for 2 or 3 of occasions we like to [place]. ([id number]) see and follow this and make sure were proceeding in the right direction 5. No evidence of infection Electronic Signature(s) Signed: 11/11/2021 3:34:05 PM By: Linton Ham MD Entered By: Linton Ham on 11/11/2021 09:44:19 [name] ([id number]) -------------------------------------------------------------------------------- ROS/PFSH Details [name] Date of Service: 11/11/2021 8:45 AM Medical Record Number: [id number] Patient Account Number: [id number] Date of Birth/Sex: 1926-08-09 (88 y.o. F) Treating RN: Carlene Coria Primary Care Provider: Derinda Late Other Clinician: Referring Provider: Derinda Late Treating Provider/Extender: Tito Dine in Treatment: 0 Information Obtained From Patient Constitutional Symptoms (General Health) Medical History: Past Medical History Notes: A-Fib; HTN; Valvular heart disease Eyes Medical History: Positive for: Cataracts - removed Negative for: Glaucoma; Optic Neuritis Ear/Nose/Mouth/Throat Medical History: Negative for: Chronic sinus problems/congestion Hematologic/Lymphatic Medical History: Negative for: Anemia; Hemophilia; Human Immunodeficiency Virus; Lymphedema; Sickle Cell Disease Respiratory Medical History: Negative for: Aspiration; Asthma; Chronic Obstructive Pulmonary Disease (COPD); Pneumothorax; Sleep Apnea; Tuberculosis Cardiovascular Medical History: Positive for: Deep Vein Thrombosis; Hypertension Negative for: Angina; Arrhythmia; Congestive Heart Failure; Coronary Artery Disease; Hypotension; Myocardial Infarction; Peripheral Arterial Disease; Peripheral Venous Disease; Phlebitis;  Vasculitis Gastrointestinal Medical History: Negative for: Cirrhosis ; Colitis; Crohnos; Hepatitis A; Hepatitis B; Hepatitis C Endocrine Medical History: Negative for: Type I Diabetes; Type II Diabetes Genitourinary Medical History: Negative for: End Stage Renal Disease Immunological Medical History: Negative for: Lupus Erythematosus; Raynaudos; Scleroderma Integumentary (Skin) Moise, Krysteena C. ([id number]) Medical History: Negative for: History of Burn; History of pressure wounds Musculoskeletal Medical History: Positive for: Osteoarthritis - hands Negative for: Gout; Rheumatoid Arthritis; Osteomyelitis Neurologic Medical History: Positive for: Dementia Negative for: Neuropathy; Quadriplegia; Paraplegia; Seizure Disorder Oncologic Medical History: Negative for: Received Chemotherapy; Received Radiation HBO Extended History Items Eyes: Cataracts Immunizations Pneumococcal Vaccine: Received Pneumococcal Vaccination: Yes  Received Pneumococcal Vaccination On or After 60th Birthday: No Immunization Notes: up to date Implantable Devices None Family and Social History Cancer: Yes - Siblings; Diabetes: No; Heart Disease: No; Hypertension: Yes - Father,Siblings; Kidney Disease: No; Lung Disease: No; Seizures: No; Stroke: Yes - Father; Thyroid Problems: No; Tuberculosis: No; Former smoker; Marital Status - Married; Alcohol Use: Daily; Drug Use: No History; Caffeine Use: Never; Financial Concerns: No; Food, Clothing or Shelter Needs: No; Support System Lacking: No; Transportation Concerns: No Electronic Signature(s) Signed: 11/11/2021 10:08:44 AM By: Carlene Coria RN Signed: 11/11/2021 3:34:05 PM By: Linton Ham MD Entered By: Carlene Coria on 11/11/2021 08:58:55 Rancourt, [name] ([id number]) -------------------------------------------------------------------------------- SuperBill Details [name]. Date of Service: 11/11/2021 Medical Record Number:  [id number] Patient Account Number: [id number] Date of Birth/Sex: [birth year] (88 y.o. F) Treating RN: Carlene Coria Primary Care Provider: Derinda Late Other Clinician: Referring Provider: Derinda Late Treating Provider/Extender: Tito Dine in Treatment: 0 Diagnosis Coding ICD-10 Codes Code Description S40.022D Contusion of left upper arm, subsequent encounter L98.498 Non-pressure chronic ulcer of skin of other sites with other specified severity Facility Procedures CPT4 Code: 10175102 Description: [facility] VISIT-LEV 3 EST PT Modifier: Quantity: 1 CPT4 Code: 58527782 Description: 42353 - CHEM CAUT GRANULATION TISS Modifier: Quantity: 1 CPT4 Code: Description: ICD-10 Diagnosis Description L98.498 Non-pressure chronic ulcer of skin of other sites with other specified se Modifier: verity Quantity: Physician Procedures CPT4 Code: 6144315 Description: 99214 - WC PHYS LEVEL 4 - EST PT Modifier: 25 Quantity: 1 CPT4 Code: Description: ICD-10 Diagnosis Description S40.022D Contusion of left upper arm, subsequent encounter L98.498 Non-pressure chronic ulcer of skin of other sites with other specified sev Modifier: erity Quantity: CPT4 Code: 4008676 Description: 19509 - WC PHYS CHEM CAUT GRAN TISSUE Modifier: Quantity: 1 CPT4 Code: Description: ICD-10 Diagnosis Description L98.498 Non-pressure chronic ulcer of skin of other sites with other specified sev Modifier: erity Quantity: Electronic Signature(s) Signed: 11/11/2021 3:34:05 PM By: Linton Ham MD Entered By: Linton Ham on 11/11/2021 09:44:48

## 2021-11-11 NOTE — Progress Notes (Signed)
CHARDAY, CAPETILLO ([id number]) Visit Report for 11/11/2021 Abuse/Suicide Risk Screen Details [name], [name]. Date of Service: 11/11/2021 8:45 AM Medical Record Number: [id number] Patient Account Number: [id number] Date of Birth/Sex: [birth year] (88 y.o. F) Treating RN: Carlene Coria Primary Care Aalijah Mims: Derinda Late Other Clinician: Referring Thorne Wirz: Derinda Late Treating Zollie Clemence/Extender: Tito Dine in Treatment: 0 Abuse/Suicide Risk Screen Items Answer ABUSE RISK SCREEN: Has anyone close to you tried to hurt or harm you recentlyo No Do you feel uncomfortable with anyone in your familyo No Has anyone forced you do things that you didnot want to doo No Electronic Signature(s) Signed: 11/11/2021 10:08:44 AM By: Carlene Coria RN Entered By: Carlene Coria on 11/11/2021 [place], [name] ([id number]) -------------------------------------------------------------------------------- Activities of Daily Living Details [name], [name]. Date of Service: 11/11/2021 8:45 AM Medical Record Number: [id number] Patient Account Number: [id number] Date of Birth/Sex: 09/25/1926 (88 y.o. F) Treating RN: Carlene Coria Primary Care Lowana Hable: Derinda Late Other Clinician: Referring Koah Chisenhall: Derinda Late Treating Yianna Tersigni/Extender: Tito Dine in Treatment: 0 Activities of Daily Living Items Answer Activities of Daily Living (Please select one for each item) Drive Automobile Not Able Take Medications Need Assistance Use Telephone Need Assistance Care for Appearance Need Assistance Use Toilet Need Assistance Bath / Shower Need Assistance Dress Self Need Assistance Feed Self Need Assistance Walk Need Assistance Get In / Out Bed Need Assistance Housework Not Able Prepare Meals Not Able Handle Money Not Able Shop for Self Not Able Electronic Signature(s) Signed: 11/11/2021 10:08:44 AM By: Carlene Coria RN Entered By: Carlene Coria on 11/11/2021 08:59:52 [place], [name] ([id number]) -------------------------------------------------------------------------------- Education Screening Details [name] Date of Service: 11/11/2021 8:45 AM Medical Record Number: [id number] Patient Account Number: [id number] Date of Birth/Sex: 01-22-1926 (88 y.o. F) Treating RN: Carlene Coria Primary Care Dacia Capers: Derinda Late Other Clinician: Referring Channie Bostick: Derinda Late Treating Valarie Farace/Extender: Tito Dine in Treatment: 0 Primary Learner Assessed: Caregiver daughter Reason Patient is not Primary Learner: dementia Learning Preferences/Education Level/Primary Language Learning Preference: Explanation Highest Education Level: College or Above Preferred Language: English Cognitive Barrier Language Barrier: No Translator Needed: No Memory Deficit: No Emotional Barrier: No Cultural/Religious Beliefs Affecting Medical Care: No Physical Barrier Impaired Vision: No Impaired Hearing: No Decreased Hand dexterity: No Knowledge/Comprehension Knowledge Level: Medium Comprehension Level: High Ability to understand written instructions: High Ability to understand verbal instructions: High Motivation Anxiety Level: Anxious Cooperation: Cooperative Education Importance: Acknowledges Need Interest in Health Problems: Asks Questions Perception: Coherent Willingness to Engage in Self-Management High Activities: Readiness to Engage in Self-Management High Activities: Electronic Signature(s) Signed: 11/11/2021 10:08:44 AM By: Carlene Coria RN Entered By: Carlene Coria on 11/11/2021 09:00:43 [name] ([id number]) -------------------------------------------------------------------------------- Fall Risk Assessment Details [name]. Date of Service: 11/11/2021 8:45 AM Medical Record Number: [id number] Patient Account Number: [id number] Date of Birth/Sex:  November 20, 1925 (88 y.o. F) Treating RN: Carlene Coria Primary Care Kaleeyah Cuffie: Derinda Late Other Clinician: Referring Kymoni Monday: Derinda Late Treating Adalind Weitz/Extender: Tito Dine in Treatment: 0 Fall Risk Assessment Items Have you had 2 or more falls in the last 12 monthso 0 No Have you had any fall that resulted in injury in the last 12 monthso 0 No FALLS RISK SCREEN History of falling - immediate or within 3 months 0 No Secondary diagnosis (Do you have 2 or more medical diagnoseso) 0 No Ambulatory aid None/bed rest/wheelchair/nurse 0 No Crutches/cane/walker 0 No Furniture 0 No Intravenous therapy Access/Saline/Heparin Lock 0  No Gait/Transferring Normal/ bed rest/ wheelchair 0 No Weak (short steps with or without shuffle, stooped but able to lift head while walking, may 0 No seek support from furniture) Impaired (short steps with shuffle, may have difficulty arising from chair, head down, impaired 0 No balance) Mental Status Oriented to own ability 0 No Electronic Signature(s) Signed: 11/11/2021 10:08:44 AM By: Carlene Coria RN Entered By: Carlene Coria on 11/11/2021 09:01:06 [place], [name] ([id number]) -------------------------------------------------------------------------------- Foot Assessment Details [name]. Date of Service: 11/11/2021 8:45 AM Medical Record Number: [id number] Patient Account Number: [id number] Date of Birth/Sex: 11-28-1925 (88 y.o. F) Treating RN: Carlene Coria Primary Care Vu Liebman: Derinda Late Other Clinician: Referring Kemoni Ortega: Derinda Late Treating Barb Shear/Extender: Tito Dine in Treatment: 0 Foot Assessment Items Site Locations + = Sensation present, - = Sensation absent, C = Callus, U = Ulcer R = Redness, W = Warmth, M = Maceration, PU = Pre-ulcerative lesion F = Fissure, S = Swelling, D = Dryness Assessment Right: Left: Other Deformity: No No Prior Foot Ulcer: No No Prior  Amputation: No No Charcot Joint: No No Ambulatory Status: Ambulatory With Help Assistance Device: Wheelchair Gait: Buyer, retail Signature(s) Signed: 11/11/2021 10:08:44 AM By: Carlene Coria RN Entered By: Carlene Coria on 11/11/2021 [place], [name] ([id number]) -------------------------------------------------------------------------------- Nutrition Risk Screening Details [name]. Date of Service: 11/11/2021 8:45 AM Medical Record Number: [id number] Patient Account Number: [id number] Date of Birth/Sex: April 20, 1926 (88 y.o. F) Treating RN: Carlene Coria Primary Care Brantlee Hinde: Derinda Late Other Clinician: Referring Nyisha Clippard: Derinda Late Treating Melyna Huron/Extender: Tito Dine in Treatment: 0 Height (in): 60 Weight (lbs): 94 Body Mass Index (BMI): 18.4 Nutrition Risk Screening Items Score Screening NUTRITION RISK SCREEN: I have an illness or condition that made me change the kind and/or amount of food I eat 0 No I eat fewer than two meals per day 0 No I eat few fruits and vegetables, or milk products 0 No I have three or more drinks of beer, liquor or wine almost every day 0 No I have tooth or mouth problems that make it hard for me to eat 0 No I don't always have enough money to buy the food I need 0 No I eat alone most of the time 0 No I take three or more different prescribed or over-the-counter drugs a day 1 Yes Without wanting to, I have lost or gained 10 pounds in the last six months 0 No I am not always physically able to shop, cook and/or feed myself 2 Yes Nutrition Protocols Good Risk Protocol Moderate Risk Protocol 0 Provide education on nutrition High Risk Proctocol Risk Level: Moderate Risk Score: 3 Electronic Signature(s) Signed: 11/11/2021 10:08:44 AM By: Carlene Coria RN Entered By: Carlene Coria on 11/11/2021 09:01:28

## 2021-11-18 ENCOUNTER — Encounter: Payer: Medicare Other | Admitting: Physician Assistant

## 2021-11-18 ENCOUNTER — Other Ambulatory Visit: Payer: Self-pay

## 2021-11-18 DIAGNOSIS — L98498 Non-pressure chronic ulcer of skin of other sites with other specified severity: Secondary | ICD-10-CM | POA: Diagnosis not present

## 2021-11-18 NOTE — Progress Notes (Signed)
LASHA, ECHEVERRIA ([id number]) Visit Report for 11/18/2021 Arrival Information Details [name], [name]. Date of Service: 11/18/2021 11:00 AM Medical Record Number: [id number] Patient Account Number: [id number] Date of Birth/Sex: September 02, 1926 (88 y.o. F) Treating RN: Carlene Coria Primary Care Annalisse Minkoff: Derinda Late Other Clinician: Referring Yaire Kreher: Derinda Late Treating Torey Reinard/Extender: Skipper Cliche in Treatment: 1 Visit Information History Since Last Visit All ordered tests and consults were completed: No Patient Arrived: Wheel Chair Added or deleted any medications: No Arrival Time: 11:00 Any new allergies or adverse reactions: No Accompanied By: son Had a fall or experienced change in No Transfer Assistance: None activities of daily living that may affect Patient Identification Verified: Yes risk of falls: Secondary Verification Process Completed: Yes Signs or symptoms of abuse/neglect since last visito No Patient Requires Transmission-Based Precautions: No Hospitalized since last visit: No Patient Has Alerts: No Implantable device outside of the clinic excluding No cellular tissue based products placed in the center since last visit: Has Dressing in Place as Prescribed: Yes Pain Present Now: No Electronic Signature(s) Signed: 11/18/2021 2:31:10 PM By: Carlene Coria RN Entered By: Carlene Coria on 11/18/2021 11:07:05 [place], [name] ([id number]) -------------------------------------------------------------------------------- Clinic Level of Care Assessment Details [name]. Date of Service: 11/18/2021 11:00 AM Medical Record Number: [id number] Patient Account Number: [id number] Date of Birth/Sex: 1926-05-03 (88 y.o. F) Treating RN: Carlene Coria Primary Care Dorotha Hirschi: Derinda Late Other Clinician: Referring Harvest Deist: Derinda Late Treating Jessicamarie Amiri/Extender: Skipper Cliche in Treatment: 1 Clinic Level of Care  Assessment Items TOOL 1 Quantity Score []  - Use when EandM and Procedure is performed on INITIAL visit 0 ASSESSMENTS - Nursing Assessment / Reassessment []  - General Physical Exam (combine w/ comprehensive assessment (listed just below) when performed on new 0 pt. evals) []  - 0 Comprehensive Assessment (HX, ROS, Risk Assessments, Wounds Hx, etc.) ASSESSMENTS - Wound and Skin Assessment / Reassessment []  - Dermatologic / Skin Assessment (not related to wound area) 0 ASSESSMENTS - Ostomy and/or Continence Assessment and Care []  - Incontinence Assessment and Management 0 []  - 0 Ostomy Care Assessment and Management (repouching, etc.) PROCESS - Coordination of Care []  - Simple Patient / Family Education for ongoing care 0 []  - 0 Complex (extensive) Patient / Family Education for ongoing care []  - 0 Staff obtains Programmer, systems, Records, Test Results / Process Orders []  - 0 Staff telephones HHA, Nursing Homes / Clarify orders / etc []  - 0 Routine Transfer to another Facility (non-emergent condition) []  - 0 Routine Hospital Admission (non-emergent condition) []  - 0 New Admissions / Biomedical engineer / Ordering NPWT, Apligraf, etc. []  - 0 Emergency Hospital Admission (emergent condition) PROCESS - Special Needs []  - Pediatric / Minor Patient Management 0 []  - 0 Isolation Patient Management []  - 0 Hearing / Language / Visual special needs []  - 0 Assessment of Community assistance (transportation, D/C planning, etc.) []  - 0 Additional assistance / Altered mentation []  - 0 Support Surface(s) Assessment (bed, cushion, seat, etc.) INTERVENTIONS - Miscellaneous []  - External ear exam 0 []  - 0 Patient Transfer (multiple staff / Civil Service fast streamer / Similar devices) []  - 0 Simple Staple / Suture removal (25 or less) []  - 0 Complex Staple / Suture removal (26 or more) []  - 0 Hypo/Hyperglycemic Management (do not check if billed separately) []  - 0 Ankle / Brachial Index (ABI) - do not  check if billed separately Has the patient been seen at the hospital within the last three years: Yes Total Score: 0  Level Of Care: ____ [name] ([id number]) Electronic Signature(s) Signed: 11/18/2021 2:31:10 PM By: Carlene Coria RN Entered By: Carlene Coria on 11/18/2021 11:20:35 [name], [name] ([id number]) -------------------------------------------------------------------------------- Encounter Discharge Information Details [name], [name]. Date of Service: 11/18/2021 11:00 AM Medical Record Number: [id number] Patient Account Number: [id number] Date of Birth/Sex: [birth year] (88 y.o. F) Treating RN: Carlene Coria Primary Care Haasini Patnaude: Derinda Late Other Clinician: Referring Etsuko Dierolf: Derinda Late Treating Cherylin Waguespack/Extender: Skipper Cliche in Treatment: 1 Encounter Discharge Information Items Discharge Condition: Stable Ambulatory Status: Wheelchair Discharge Destination: Home Transportation: Private Auto Accompanied By: son Schedule Follow-up Appointment: Yes Clinical Summary of Care: Patient Declined Electronic Signature(s) Signed: 11/18/2021 2:31:10 PM By: Carlene Coria RN Entered By: Carlene Coria on 11/18/2021 11:22:04 [name] ([id number]) -------------------------------------------------------------------------------- Lower Extremity Assessment Details [name]. Date of Service: 11/18/2021 11:00 AM Medical Record Number: [id number] Patient Account Number: [id number] Date of Birth/Sex: 1926-01-29 (88 y.o. F) Treating RN: Carlene Coria Primary Care Hilde Churchman: Derinda Late Other Clinician: Referring Anastacio Bua: Derinda Late Treating Shlok Raz/Extender: Skipper Cliche in Treatment: 1 Electronic Signature(s) Signed: 11/18/2021 2:31:10 PM By: Carlene Coria RN Entered By: Carlene Coria on 11/18/2021 11:13:56 [name], [name]  ([id number]) -------------------------------------------------------------------------------- Multi Wound Chart Details [name]. Date of Service: 11/18/2021 11:00 AM Medical Record Number: [id number] Patient Account Number: [id number] Date of Birth/Sex: 11/23/1925 (88 y.o. F) Treating RN: Carlene Coria Primary Care Shalev Helminiak: Derinda Late Other Clinician: Referring Josealberto Montalto: Derinda Late Treating Damein Gaunce/Extender: Skipper Cliche in Treatment: 1 Vital Signs Height(in): 60 Pulse(bpm): 76 Weight(lbs): 4 Blood Pressure(mmHg): 106/88 Body Mass Index(BMI): 18.4 Temperature(F): 98.2 Respiratory Rate(breaths/min): 18 Photos: [N/A:N/A] Wound Location: Left, Anterior Upper Arm Left, Distal, Anterior Upper Arm N/A Wounding Event: Hematoma Hematoma N/A Primary Etiology: Trauma, Other Trauma, Other N/A Comorbid History: Cataracts, Deep Vein Thrombosis, Cataracts, Deep Vein Thrombosis, N/A Hypertension, Osteoarthritis, Hypertension, Osteoarthritis, Dementia Dementia Date Acquired: 10/06/2021 10/06/2021 N/A Weeks of Treatment: 1 1 N/A Wound Status: Open Open N/A Wound Recurrence: No No N/A Measurements L x W x D (cm) 4.5x0.7x0.5 0x0x0 N/A Area (cm) : 2.474 0 N/A Volume (cm) : 1.237 0 N/A % Reduction in Area: 55.30% 100.00% N/A % Reduction in Volume: 55.30% 100.00% N/A Classification: Full Thickness Without Exposed Full Thickness Without Exposed N/A Support Structures Support Structures Exudate Amount: Medium Medium N/A Exudate Type: Serosanguineous Serosanguineous N/A Exudate Color: red, brown red, brown N/A Granulation Amount: Large (67-100%) Large (67-100%) N/A Granulation Quality: Red Hyper-granulation N/A Necrotic Amount: Small (1-33%) None Present (0%) N/A Exposed Structures: Fat Layer (Subcutaneous Tissue): Fascia: No N/A Yes Fat Layer (Subcutaneous Tissue): Fascia: No No Tendon: No Tendon: No Muscle: No Muscle: No Joint: No Joint:  No Bone: No Bone: No Epithelialization: None None N/A Treatment Notes Electronic Signature(s) Signed: 11/18/2021 2:31:10 PM By: Carlene Coria RN Entered By: Carlene Coria on 11/18/2021 11:17:30 [name] ([id number]) -------------------------------------------------------------------------------- Multi-Disciplinary Care Plan Details [name]. Date of Service: 11/18/2021 11:00 AM Medical Record Number: [id number] Patient Account Number: [id number] Date of Birth/Sex: 01-08-1926 (88 y.o. F) Treating RN: Carlene Coria Primary Care Era Parr: Derinda Late Other Clinician: Referring Jaquia Benedicto: Derinda Late Treating Swayzee Wadley/Extender: Skipper Cliche in Treatment: 1 Active Inactive Wound/Skin Impairment Nursing Diagnoses: Knowledge deficit related to ulceration/compromised skin integrity Goals: Patient/caregiver will verbalize understanding of skin care regimen Date Initiated: 11/11/2021 Target Resolution Date: 11/11/2021 Goal Status: Active Ulcer/skin breakdown will have a volume reduction of 30% by week 4 Date Initiated: 11/11/2021 Target Resolution Date: 12/12/2021  Goal Status: Active Ulcer/skin breakdown will have a volume reduction of 50% by week 8 Date Initiated: 11/11/2021 Target Resolution Date: 01/09/2022 Goal Status: Active Ulcer/skin breakdown will have a volume reduction of 80% by week 12 Date Initiated: 11/11/2021 Target Resolution Date: 02/09/2022 Goal Status: Active Ulcer/skin breakdown will heal within 14 weeks Date Initiated: 11/11/2021 Target Resolution Date: 03/11/2022 Goal Status: Active Interventions: Assess patient/caregiver ability to obtain necessary supplies Assess patient/caregiver ability to perform ulcer/skin care regimen upon admission and as needed Assess ulceration(s) every visit Notes: Electronic Signature(s) Signed: 11/18/2021 2:31:10 PM By: Carlene Coria RN Entered By: Carlene Coria on 11/18/2021 11:17:11 [name], [name] ([id number]) -------------------------------------------------------------------------------- Pain Assessment Details [name]. Date of Service: 11/18/2021 11:00 AM Medical Record Number: [id number] Patient Account Number: [id number] Date of Birth/Sex: 05/19/1926 (88 y.o. F) Treating RN: Carlene Coria Primary Care Quintavius Niebuhr: Derinda Late Other Clinician: Referring Festus Pursel: Derinda Late Treating Shardee Dieu/Extender: Skipper Cliche in Treatment: 1 Active Problems Location of Pain Severity and Description of Pain Patient Has Paino No Site Locations Pain Management and Medication Current Pain Management: Electronic Signature(s) Signed: 11/18/2021 2:31:10 PM By: Carlene Coria RN Entered By: Carlene Coria on 11/18/2021 11:07:29 [name] ([id number]) -------------------------------------------------------------------------------- Patient/Caregiver Education Details [name]. Date of Service: 11/18/2021 11:00 AM Medical Record Number: [id number] Patient Account Number: [id number] Date of Birth/Gender: [birth year] (88 y.o. F) Treating RN: Carlene Coria Primary Care Physician: Derinda Late Other Clinician: Referring Physician: Derinda Late Treating Physician/Extender: Skipper Cliche in Treatment: 1 Education Assessment Education Provided To: Patient Education Topics Provided Wound/Skin Impairment: Methods: Explain/Verbal Responses: State content correctly Electronic Signature(s) Signed: 11/18/2021 2:31:10 PM By: Carlene Coria RN Entered By: Carlene Coria on 11/18/2021 11:21:00 [name], [name] ([id number]) -------------------------------------------------------------------------------- Wound Assessment Details [name]. Date of Service: 11/18/2021 11:00 AM Medical Record Number: [id number] Patient Account Number: [id number] Date of Birth/Sex: 1926-01-12 (88 y.o. F) Treating RN: Carlene Coria Primary  Care Novalee Horsfall: Derinda Late Other Clinician: Referring Jocelynne Duquette: Derinda Late Treating Jessic Standifer/Extender: Skipper Cliche in Treatment: 1 Wound Status Wound Number: 4 Primary Trauma, Other Etiology: Wound Location: Left, Anterior Upper Arm Wound Status: Open Wounding Event: Hematoma Comorbid Cataracts, Deep Vein Thrombosis, Hypertension, Date Acquired: 10/06/2021 History: Osteoarthritis, Dementia Weeks Of Treatment: 1 Clustered Wound: No Photos Wound Measurements Length: (cm) 4.5 Width: (cm) 0.7 Depth: (cm) 0.5 Area: (cm) 2.474 Volume: (cm) 1.237 % Reduction in Area: 55.3% % Reduction in Volume: 55.3% Epithelialization: None Tunneling: No Undermining: No Wound Description Classification: Full Thickness Without Exposed Support Structu Exudate Amount: Medium Exudate Type: Serosanguineous Exudate Color: red, brown res Foul Odor After Cleansing: No Slough/Fibrino Yes Wound Bed Granulation Amount: Large (67-100%) Exposed Structure Granulation Quality: Red Fascia Exposed: No Necrotic Amount: Small (1-33%) Fat Layer (Subcutaneous Tissue) Exposed: Yes Necrotic Quality: Adherent Slough Tendon Exposed: No Muscle Exposed: No Joint Exposed: No Bone Exposed: No Treatment Notes Wound #4 (Upper Arm) Wound Laterality: Left, Anterior Cleanser Peri-Wound Care Topical Primary Dressing [name], [name] ([id number]) Silvercel 4 1/4x 4 1/4 (in/in) Discharge Instruction: pack lightly into undermining 6 oclock to 12 oclock Secondary Dressing ABD Pad 5x9 (in/in) Discharge Instruction: Cover with ABD pad Kerlix 4.5 x 4.1 (in/yd) Discharge Instruction: Apply Kerlix 4.5 x 4.1 (in/yd) as instructed Secured With 42M Medipore H Soft Cloth Surgical Tape, 2x2 (in/yd) Compression Wrap Compression Stockings Add-Ons ace wrap and geri sleeve Electronic Signature(s) Signed: 11/18/2021 2:31:10 PM By: Carlene Coria RN Entered By: Carlene Coria on 11/18/2021 11:13:23 [name], Brittany  C. ([id number]) -------------------------------------------------------------------------------- Wound Assessment Details [name], [name]. Date of Service: 11/18/2021 11:00 AM Medical Record Number: [id number] Patient Account Number: [id number] Date of Birth/Sex: May 31, 1926 (88 y.o. F) Treating RN: Carlene Coria Primary Care Avonell Lenig: Derinda Late Other Clinician: Referring Nataliee Shurtz: Derinda Late Treating Akima Slaugh/Extender: Skipper Cliche in Treatment: 1 Wound Status Wound Number: 5 Primary Trauma, Other Etiology: Wound Location: Left, Distal, Anterior Upper Arm Wound Status: Open Wounding Event: Hematoma Comorbid Cataracts, Deep Vein Thrombosis, Hypertension, Date Acquired: 10/06/2021 History: Osteoarthritis, Dementia Weeks Of Treatment: 1 Clustered Wound: No Photos Wound Measurements Length: (cm) 0 Width: (cm) 0 Depth: (cm) 0 Area: (cm) Volume: (cm) % Reduction in Area: 100% % Reduction in Volume: 100% Epithelialization: None 0 Tunneling: No 0 Undermining: No Wound Description Classification: Full Thickness Without Exposed Support Structures Exudate Amount: Medium Exudate Type: Serosanguineous Exudate Color: red, brown Foul Odor After Cleansing: No Slough/Fibrino No Wound Bed Granulation Amount: Large (67-100%) Exposed Structure Granulation Quality: Hyper-granulation Fascia Exposed: No Necrotic Amount: None Present (0%) Fat Layer (Subcutaneous Tissue) Exposed: No Tendon Exposed: No Muscle Exposed: No Joint Exposed: No Bone Exposed: No Electronic Signature(s) Signed: 11/18/2021 2:31:10 PM By: Carlene Coria RN Entered By: Carlene Coria on 11/18/2021 11:13:44 [name], [name] ([id number]) -------------------------------------------------------------------------------- Vitals Details [name] Date of Service: 11/18/2021 11:00 AM Medical Record Number: [id number] Patient Account Number: [id number] Date of Birth/Sex:  Jul 11, 1926 (88 y.o. F) Treating RN: Carlene Coria Primary Care Teria Khachatryan: Derinda Late Other Clinician: Referring Alexandr Oehler: Derinda Late Treating Ketina Mars/Extender: Skipper Cliche in Treatment: 1 Vital Signs Time Taken: 11:07 Temperature (F): 98.2 Height (in): 60 Pulse (bpm): 77 Weight (lbs): 94 Respiratory Rate (breaths/min): 18 Body Mass Index (BMI): 18.4 Blood Pressure (mmHg): 106/88 Reference Range: 80 - 120 mg / dl Electronic Signature(s) Signed: 11/18/2021 2:31:10 PM By: Carlene Coria RN Entered By: Carlene Coria on 11/18/2021 11:07:22

## 2021-11-18 NOTE — Progress Notes (Addendum)
Brittany, [name] ([id number]) Visit Report for 11/18/2021 Chief Complaint Document Details [name], [name]. Date of Service: 11/18/2021 11:00 AM Medical Record Number: [id number] Patient Account Number: [id number] Date of Birth/Sex: Jan 14, 1926 (88 y.o. F) Treating RN: Carlene Coria Primary Care Provider: Derinda Late Other Clinician: Referring Provider: Derinda Late Treating Provider/Extender: Skipper Cliche in Treatment: 1 Information Obtained from: Patient Chief Complaint Left anterior shin ulcer 01/28/2020; patient is here for review of wound on her right anterior lower shin 11/11/2021; the patient is here for review of wound secondary to a hematoma on the left forearm Electronic Signature(s) Signed: 11/18/2021 11:15:27 AM By: Worthy Keeler PA-C Entered By: Worthy Keeler on 11/18/2021 11:15:27 Piggott, [name] ([id number]) -------------------------------------------------------------------------------- HPI Details [name]. Date of Service: 11/18/2021 11:00 AM Medical Record Number: [id number] Patient Account Number: [id number] Date of Birth/Sex: 1925-12-11 (88 y.o. F) Treating RN: Carlene Coria Primary Care Provider: Derinda Late Other Clinician: Referring Provider: Derinda Late Treating Provider/Extender: Skipper Cliche in Treatment: 1 History of Present Illness HPI Description: 06/06/17 on evaluation today patient presents for evaluation concerning an ulcer which she initially had arise as a result of striking her left lower extremity money bed frame. With that being said this was roughly one month ago and unfortunately the wound despite two courses of Keflex have not really made a dramatic improvement. This has continued to drain as well as being exquisitely tender at times. Even to the point that she is having a difficult time walking. Patient does have chronic atrial fibrillation which subsequently leads to her being on  long-term anticoagulant therapy and this causes ED bruising which may have contributed to this entry and where things are at this point. She does have valvular heart disease as well as hypertension and her last INR was 2.7. Currently Neosporin, Gauls, and an ace wrap has been utilized. Keflex is the antibiotic that she has been on. Patient has not had a culture up to this point and she states that her blood pressure at the primary care office yesterday was 138/72. Her pain is significant related to be an eight out of 10. No fevers, chills, nausea, or vomiting noted at this time. 06/20/17; patient was admitted here 2 weeks ago. She had a traumatic wound on her left lateral lower leg probably in the setting of some degree of venous insufficiency with surrounding cellulitis. She had been on Keflex, after we saw her she was put on doxycycline which she apparently did not tolerate. Culture that was done at the time showed Morganella which was resistant to Keflex. Apparently the patient continued to take Keflex after the appointment. She is also on Coumadin. I had tried to change her antibiotic when the Morganella culture was brought to my attention however apparently our staff could not reach the patient to inform them of the antibiotic change 06/26/17; patient has wound on her left lateral lower extremity in the setting of some degree of venous insufficiency. I gave her cefdinir last week and she is completing this. There is no evidence of surrounding infection. Aggressive debridement last week, wound bed looks healthier. We've been using Aquacel. They're traveling in the [place] next week we'll see her back in 2 weeks unless there are problems. 07/10/17; the patient is been using Aquacel Ag her husband is changing this with Kerlix and conform. She still complains of a lot of pain. Apparently with the antibiotics. We gave 2 or 3 weeks ago. Her INR went up  to 6, this was managed by her primary  physician 07/17/17; she is using Aquacel Ag and a border foam. Still discomfort although dimensions are better. 07/24/17; patient or for review of a trauma wound on her left anterior leg in the setting of some degree of chronic venous insufficiency she has been using Aquacel Ag and a border foam and making progress. 07/31/17; only a small open area of this original significant trauma is still open. Her husband is changing this every second day [Aquacel Ag] Readmission: 01/14/19 on evaluation today patient is that she seen for initial inspection during the office visit today concerning a trauma/skin tear to the left anterior lower extremity. This is roughly the same region where she was previously treated and years past when she was here in the clinic. Nonetheless upon evaluation today the patient's wound actually showed signs of decent granulation around the edge although unfortunately the skin flap that was torn back did fold under on itself and the flap itself is dying. She does have some eschar surrounded the edges of the wound at this point. Nonetheless this also was very tender for her. The patient does have a history of hypertension, atrial fibrillation, and long-term use anticoagulant therapy. 01/21/19 on evaluation today patient's wound on the anterior portion of her lower extremity appears to still be necrotic as far as the surface of the wound is concerned. Unfortunately there is no significant improvement at this point compared to last week they were not able to get the Santyl that are using Medihoney and maybe one reason why. The other is I think this may be stained to drive. Possibly adding something such as mepitel over top of the Medihoney may help to loosen this up quite a bit which I think would be in her best interest. Also she still has a lot of erythema and is very tender to touch I'm afraid that we may need to switch to a different antibiotic to see if this will be of benefit there  still really nothing that I can culture to get a better picture of what's going on and what we need to do from the standpoint of antibiotics. I do believe the patient needs a referral Talmuds me to vascular in order to evaluate her blood flow based on what I'm seeing they will be able to perform TBI testing if nothing else along with the vascular evaluation to see if there's any evidence for limited blood flow to this lower extremity. 4/8; I have reviewed the patient's arterial studies and they are really quite good. There should be no arterial impediments to healing the wound on the right anterior tibial area. 4/15; patient has a small open area over the left mid tibial area. Still requiring debridement we are using Santyl here and Medihoney at home [Santyl unaffordable] 4/22; difficult, presumably traumatic area over the left mid tibial area in the setting of chronic venous insufficiency. We have been using Santyl here and meta honey at home. The wound surface is getting gradually better and slight improvement in dimensions. Her husband is changing this daily 4/29; left mid tibia presumably traumatic wound in the setting of chronic venous insufficiency. We have been using Santyl and a combination of Medihoney at home. Arrives today with a much better looking healthy granulated surface. We will change the primary dressing to [facility] 5/6; left mid tibia smaller reasonably healthy looking wound using Hydrofera Blue since last week 5/20-Patient returns to clinic with a left mid tibial wound looking  better, we have been using Hydrofera Blue since the previous week 5/27; patient returns to clinic with a left mid tibial wound covered in raised eschar. We have been using Hydrofera Blue her husband changes the dressings using a border foam cover READMISSION 01/28/20 Mrs. Madura is a patient who is now 88 years old. As usual she comes in with her husband who is her primary caregiver. They were here  last from March through May 2020 with a traumatic wound on the left anterior tibia we eventually heal this out. She has chronic venous insufficiency. She is also here previously in 2018. Her current problem started 8 days ago. She went in the hospital for a thoracic level kyphoplasty. The procedure she developed a skin tear on her leg. This was mentioned to her. Her husband is applying Medihoney to the area and they are in for our review of this. She finds this much too uncomfortable to attempt ABIs although her ABIs when she was in the clinic last year were quite normal bilaterally Zahler, Denyse C. ([id number]) Past medical history she is not a diabetic. As noted she had a T9 and T6 kyphoplasty, Alzheimer's disease, chronic atrial fibrillation on Coumadin and hypertension 4/14; patient arrives in clinic having removed part of the wrap. We attempted to leave this on all week. Unfortunately none that none of the tissue that looks semiviable last week has remained viable. She required an extensive debridement. We have been using silver alginate 4/21; the patient arrived today with tremendous erythema around the large part of the wound on the right lateral leg. This extends medially and down towards her foot. I marked the area but I think the extent of the likely cellulitis is beyond what can be safely managed as an outpatient. 02/23/2020 upon evaluation today patient is seen today though she is typically followed by Dr. Dellia Nims. Last time he saw her he sent her to the hospital and she subsequently was in the hospital for some time. In fact it was from 02/11/2020 through 02/19/2020. During that time she was on antibiotic therapy by way of IV antibiotics. Her leg does appear to be doing better today compared to what we saw then. Nonetheless she had a culture that was obtained while she was in the hospital by Dr. Steva Ready. This ended up not growing anything which is not really surprising considering she  was on antibiotics which were fairly strong at the time of culture. There was no growth at all after 2 days. With that being said Dr. Steva Ready apparently discussed with Dr. Dellia Nims per her note anyway that she wanted the patient off of the antibiotics for a week and then to repeat a culture in the outpatient setting. She also has a suspicion for the possibility of pyoderma and has referred the patient to Dr. Nehemiah Massed and the patient is supposed to be seeing him sometime I believe Thursday of this week. Nonetheless I explained that pyoderma can sometimes begin as a injury or insult that then worsens. Obviously the initial injury here appears to have been a scraping of her leg that occurred when the patient was being transferred off of the table after a kyphoplasty procedure. Fortunately again as mentioned the infection seems to be doing significantly better based on what I am seeing at this point compared to previous pictures that I reviewed today. 5/5; the patient is back to my clinic today after seeing with stone 2 days ago. She is on [employer] with a Kerlix Coban I  have reviewed her recent history. We had sent her to the hospital with presumed cellulitis considerable erythema in the lateral leg. She received prolonged courses of antibiotics in the hospital. I received a call from one of the hospitalist to report that he didn't feel that she was responding to antibiotics although as noted 2 days ago by Jeri Cos the wound actually looks a lot better along with the periwound skin which is not nearly as erythematous. He raised the possibility of pyoderma gangrenosum which also had been brought up by Dr. Steva Ready the infectious disease consultant. The discharge culture was negative but she was still on antibiotics The pyoderma gangrenosum apparently has something to do with the fact that both her sons have Crohn's disease although the patient does not have Crohn's disease. I told the doctor that  call me I didn't really think this fit with the history which was a trauma induced wound why the patient was having a kyphoplasty. In fact her husband is still very angry about this with the hospital. She also has severe chronic venous insufficiency with hemosiderin and stasis dermatitis and has had wounds in both legs. 5/12; venous insufficiency wound on the right lower lateral lower leg initially trauma. Still debris on the surface of this wound that undergoes a difficult debridement. We have been using Hydrofera Blue 5/19; venous insufficiency wound on the right lower lateral leg initially trauma. Better looking wound surface and a rim of epithelialization. Surface area is smaller we have been using Hydrofera Blue under compression 03/17/20-Patient returns at 1 week, the wound is looking stable, dimensions are slightly smaller, continue Hydrofera Blue and Santyl under compression 6/2; venous insufficiency wound on the right lower leg initially traumatic. She has a considerably better looking wound surface than when I saw this 2 weeks ago. Some hyper granulation. We have been using Santyl with [facility] cover. I no longer think the Santyl is necessary we will just use Hydrofera Blue kerlix Coban compression They are going to be traveling including to the [facility] and then subsequently a family vacation in [place]. Her daughter-in-law stated that they would be able to get her in here in 2 weeks before they go to [place] I think that would be good plan. We will show her how to do the dressing including the Kerlix Coban 6/9; venous insufficiency wound on the right lower leg initially traumatic. Complicated by infection requiring hospitalization. Since then she has done very nicely and the wound has been getting progressively smaller we have been using Hydrofera Blue under Kerlix and Coban 6/16; venous insufficiency wound on the right lower leg initially traumatic. This was  complicated by an infection with persistent cellulitis requiring a fairly protracted hospitalization. We are using Hydrofera Blue under Kerlix and Coban. The wound is contracting nicely. 6/30; venous insufficiency wound on the right lateral lower leg which was initially traumatic in the hospital has healed. Unfortunately the patient was vacationing in [place] last week had 2 falls in quick succession and by description of her daughter who is with her today she has a bimalleolar fracture in the right ankle. She is seen in [place]. She has a soft brace with an Ace wrap. The patient has dementia making weightbearing restrictions difficult. READMISSION 11/11/2021 This is a now 88 year old woman that we have seen in the past in this clinic for predominantly chronic venous insufficiency wounds on her lower extremities. On this occasion in mid December she suffered an assisted fall and as she was  on Coumadin developed a hematoma. She was hospitalized from 12/15 to 10/11/2021 with a laceration injury to the left arm. She was treated with IV antibiotics. Notable that her hemoglobin dropped from 14.8-10.8 and if that was all in the arm that would have been a sizable amount of blood. Her daughter says that her arm was black from the wrist up to just above where her wound is now. Ultimately the eschar has come off and there has been drainage but all the blood has been evacuated. They have been using Xeroform gauze, Geri sleeves and an Ace wrap. The patient has dementia and will tend to pick at dressings. They have home health changing the dressing 2 times a week [advanced] The patient is no longer on Coumadin and this was discontinued. She has very significant dementia which is much more advanced than I remember in 2021 even. She has tricuspid regurgitation mitral regurgitation and hypertension. 11/18/2021 upon evaluation today patient appears to be doing well with regard to the wound on her forearm.  Fortunately she does not have any signs of infection at this time which is great news. She does have some hypergranulation. We do need to treat this again today I do believe. Electronic Signature(s) Signed: 11/18/2021 11:24:33 AM By: Worthy Keeler PA-C Entered By: Worthy Keeler on 11/18/2021 11:24:32 Charlie, [name] ([id number]) -------------------------------------------------------------------------------- Otelia Sergeant TISS Details [name] Date of Service: 11/18/2021 11:00 AM Medical Record Number: [id number] Patient Account Number: [id number] Date of Birth/Sex: 1926-03-29 (88 y.o. F) Treating RN: Carlene Coria Primary Care Provider: Derinda Late Other Clinician: Referring Provider: Derinda Late Treating Provider/Extender: Skipper Cliche in Treatment: 1 Procedure Performed for: Wound #4 Left,Anterior Upper Arm Performed By: Physician Tommie Sams., PA-C Post Procedure Diagnosis Same as Pre-procedure Electronic Signature(s) Signed: 11/18/2021 2:31:10 PM By: Carlene Coria RN Entered By: Carlene Coria on 11/18/2021 11:20:04 [name] ([id number]) -------------------------------------------------------------------------------- Physical Exam Details [name]. Date of Service: 11/18/2021 11:00 AM Medical Record Number: [id number] Patient Account Number: [id number] Date of Birth/Sex: 1926/08/26 (88 y.o. F) Treating RN: Carlene Coria Primary Care Provider: Derinda Late Other Clinician: Referring Provider: Derinda Late Treating Provider/Extender: Skipper Cliche in Treatment: 1 Constitutional Well-nourished and well-hydrated in no acute distress. Respiratory normal breathing without difficulty. Psychiatric this patient is able to make decisions and demonstrates good insight into disease process. Alert and Oriented x 3. pleasant and cooperative. Notes Upon inspection patient's wound ischial  hypergranulation we did treat this with silver nitrate today which she tolerated without complication I did treat it pretty well to try to help flatten this out hopefully will see some really good improvement come next week. Electronic Signature(s) Signed: 11/18/2021 11:24:48 AM By: Worthy Keeler PA-C Entered By: Worthy Keeler on 11/18/2021 11:24:48 [name] ([id number]) -------------------------------------------------------------------------------- Physician Orders Details [name] Date of Service: 11/18/2021 11:00 AM Medical Record Number: [id number] Patient Account Number: [id number] Date of Birth/Sex: 12-30-1925 (88 y.o. F) Treating RN: Carlene Coria Primary Care Provider: Derinda Late Other Clinician: Referring Provider: Derinda Late Treating Provider/Extender: Skipper Cliche in Treatment: 1 Verbal / Phone Orders: No Diagnosis Coding ICD-10 Coding Code Description S40.022D Contusion of left upper arm, subsequent encounter L98.498 Non-pressure chronic ulcer of skin of other sites with other specified severity Follow-up Appointments o Return Appointment in 1 week. [place] for wound care. May utilize formulary equivalent dressing for wound treatment orders unless otherwise specified. Home  Health Nurse may visit PRN to address patientos wound care needs. - [facility] Bathing/ Shower/ Hygiene o May shower; gently cleanse wound with antibacterial soap, rinse and pat dry prior to dressing wounds Wound Treatment Wound #4 - Upper Arm Wound Laterality: Left, Anterior Primary Dressing: Silvercel 4 1/4x 4 1/4 (in/in) 3 x Per Week/30 Days Discharge Instructions: pack lightly into undermining 6 oclock to 12 oclock Secondary Dressing: ABD Pad 5x9 (in/in) 3 x Per Week/30 Days Discharge Instructions: Cover with ABD pad Secondary Dressing: Kerlix 4.5 x 4.1 (in/yd) 3 x Per Week/30 Days Discharge Instructions:  Apply Kerlix 4.5 x 4.1 (in/yd) as instructed Secured With: 12M Medipore H Soft Cloth Surgical Tape, 2x2 (in/yd) 3 x Per Week/30 Days Add-Ons: ace wrap and geri sleeve 3 x Per Week/30 Days Electronic Signature(s) Signed: 11/18/2021 2:31:10 PM By: Carlene Coria RN Signed: 11/18/2021 4:30:05 PM By: Worthy Keeler PA-C Entered By: Carlene Coria on 11/18/2021 11:19:34 Neighbors, [name] ([id number]) -------------------------------------------------------------------------------- Problem List Details [name]. Date of Service: 11/18/2021 11:00 AM Medical Record Number: [id number] Patient Account Number: [id number] Date of Birth/Sex: 06-06-1926 (88 y.o. F) Treating RN: Carlene Coria Primary Care Provider: Derinda Late Other Clinician: Referring Provider: Derinda Late Treating Provider/Extender: Skipper Cliche in Treatment: 1 Active Problems ICD-10 Encounter Code Description Active Date MDM Diagnosis S40.022D Contusion of left upper arm, subsequent encounter 11/11/2021 No Yes L98.498 Non-pressure chronic ulcer of skin of other sites with other specified 11/11/2021 No Yes severity Inactive Problems Resolved Problems Electronic Signature(s) Signed: 11/18/2021 11:15:18 AM By: Worthy Keeler PA-C Entered By: Worthy Keeler on 11/18/2021 11:15:18 Harvell, [name] ([id number]) -------------------------------------------------------------------------------- Progress Note Details [name]. Date of Service: 11/18/2021 11:00 AM Medical Record Number: [id number] Patient Account Number: [id number] Date of Birth/Sex: 02-06-1926 (88 y.o. F) Treating RN: Carlene Coria Primary Care Provider: Derinda Late Other Clinician: Referring Provider: Derinda Late Treating Provider/Extender: Skipper Cliche in Treatment: 1 Subjective Chief Complaint Information obtained from Patient Left anterior shin ulcer 01/28/2020; patient is here for review of wound on  her right anterior lower shin 11/11/2021; the patient is here for review of wound secondary to a hematoma on the left forearm History of Present Illness (HPI) 06/06/17 on evaluation today patient presents for evaluation concerning an ulcer which she initially had arise as a result of striking her left lower extremity money bed frame. With that being said this was roughly one month ago and unfortunately the wound despite two courses of Keflex have not really made a dramatic improvement. This has continued to drain as well as being exquisitely tender at times. Even to the point that she is having a difficult time walking. Patient does have chronic atrial fibrillation which subsequently leads to her being on long-term anticoagulant therapy and this causes ED bruising which may have contributed to this entry and where things are at this point. She does have valvular heart disease as well as hypertension and her last INR was 2.7. Currently Neosporin, Gauls, and an ace wrap has been utilized. Keflex is the antibiotic that she has been on. Patient has not had a culture up to this point and she states that her blood pressure at the primary care office yesterday was 138/72. Her pain is significant related to be an eight out of 10. No fevers, chills, nausea, or vomiting noted at this time. 06/20/17; patient was admitted here 2 weeks ago. She had a traumatic wound on her left lateral lower leg  probably in the setting of some degree of venous insufficiency with surrounding cellulitis. She had been on Keflex, after we saw her she was put on doxycycline which she apparently did not tolerate. Culture that was done at the time showed Morganella which was resistant to Keflex. Apparently the patient continued to take Keflex after the appointment. She is also on Coumadin. I had tried to change her antibiotic when the Morganella culture was brought to my attention however apparently our staff could not reach the patient to  inform them of the antibiotic change 06/26/17; patient has wound on her left lateral lower extremity in the setting of some degree of venous insufficiency. I gave her cefdinir last week and she is completing this. There is no evidence of surrounding infection. Aggressive debridement last week, wound bed looks healthier. We've been using Aquacel. They're traveling in the [place] next week we'll see her back in 2 weeks unless there are problems. 07/10/17; the patient is been using Aquacel Ag her husband is changing this with Kerlix and conform. She still complains of a lot of pain. Apparently with the antibiotics. We gave 2 or 3 weeks ago. Her INR went up to 6, this was managed by her primary physician 07/17/17; she is using Aquacel Ag and a border foam. Still discomfort although dimensions are better. 07/24/17; patient or for review of a trauma wound on her left anterior leg in the setting of some degree of chronic venous insufficiency she has been using Aquacel Ag and a border foam and making progress. 07/31/17; only a small open area of this original significant trauma is still open. Her husband is changing this every second day [Aquacel Ag] Readmission: 01/14/19 on evaluation today patient is that she seen for initial inspection during the office visit today concerning a trauma/skin tear to the left anterior lower extremity. This is roughly the same region where she was previously treated and years past when she was here in the clinic. Nonetheless upon evaluation today the patient's wound actually showed signs of decent granulation around the edge although unfortunately the skin flap that was torn back did fold under on itself and the flap itself is dying. She does have some eschar surrounded the edges of the wound at this point. Nonetheless this also was very tender for her. The patient does have a history of hypertension, atrial fibrillation, and long-term use anticoagulant therapy. 01/21/19 on  evaluation today patient's wound on the anterior portion of her lower extremity appears to still be necrotic as far as the surface of the wound is concerned. Unfortunately there is no significant improvement at this point compared to last week they were not able to get the Santyl that are using Medihoney and maybe one reason why. The other is I think this may be stained to drive. Possibly adding something such as mepitel over top of the Medihoney may help to loosen this up quite a bit which I think would be in her best interest. Also she still has a lot of erythema and is very tender to touch I'm afraid that we may need to switch to a different antibiotic to see if this will be of benefit there still really nothing that I can culture to get a better picture of what's going on and what we need to do from the standpoint of antibiotics. I do believe the patient needs a referral Talmuds me to vascular in order to evaluate her blood flow based on what I'm seeing they will be able  to perform TBI testing if nothing else along with the vascular evaluation to see if there's any evidence for limited blood flow to this lower extremity. 4/8; I have reviewed the patient's arterial studies and they are really quite good. There should be no arterial impediments to healing the wound on the right anterior tibial area. 4/15; patient has a small open area over the left mid tibial area. Still requiring debridement we are using Santyl here and Medihoney at home [Santyl unaffordable] 4/22; difficult, presumably traumatic area over the left mid tibial area in the setting of chronic venous insufficiency. We have been using Santyl here and meta honey at home. The wound surface is getting gradually better and slight improvement in dimensions. Her husband is changing this daily 4/29; left mid tibia presumably traumatic wound in the setting of chronic venous insufficiency. We have been using Santyl and a combination  of Medihoney at home. Arrives today with a much better looking healthy granulated surface. We will change the primary dressing to [facility] 5/6; left mid tibia smaller reasonably healthy looking wound using Hydrofera Blue since last week 5/20-Patient returns to clinic with a left mid tibial wound looking better, we have been using Hydrofera Blue since the previous week 5/27; patient returns to clinic with a left mid tibial wound covered in raised eschar. We have been using Hydrofera Blue her husband changes the dressings using a border foam cover READMISSION 01/28/20 Mrs. Reimann is a patient who is now 88 years old. As usual she comes in with her husband who is her primary caregiver. They were here last from March through May 2020 with a traumatic wound on the left anterior tibia we eventually heal this out. She has chronic venous insufficiency. She is AAILYAH, DUNBAR ([id number]) also here previously in 2018. Her current problem started 8 days ago. She went in the hospital for a thoracic level kyphoplasty. The procedure she developed a skin tear on her leg. This was mentioned to her. Her husband is applying Medihoney to the area and they are in for our review of this. She finds this much too uncomfortable to attempt ABIs although her ABIs when she was in the clinic last year were quite normal bilaterally Past medical history she is not a diabetic. As noted she had a T9 and T6 kyphoplasty, Alzheimer's disease, chronic atrial fibrillation on Coumadin and hypertension 4/14; patient arrives in clinic having removed part of the wrap. We attempted to leave this on all week. Unfortunately none that none of the tissue that looks semiviable last week has remained viable. She required an extensive debridement. We have been using silver alginate 4/21; the patient arrived today with tremendous erythema around the large part of the wound on the right lateral leg. This extends medially and down towards  her foot. I marked the area but I think the extent of the likely cellulitis is beyond what can be safely managed as an outpatient. 02/23/2020 upon evaluation today patient is seen today though she is typically followed by Dr. Dellia Nims. Last time he saw her he sent her to the hospital and she subsequently was in the hospital for some time. In fact it was from 02/11/2020 through 02/19/2020. During that time she was on antibiotic therapy by way of IV antibiotics. Her leg does appear to be doing better today compared to what we saw then. Nonetheless she had a culture that was obtained while she was in the hospital by Dr. Steva Ready. This ended up not growing  anything which is not really surprising considering she was on antibiotics which were fairly strong at the time of culture. There was no growth at all after 2 days. With that being said Dr. Steva Ready apparently discussed with Dr. Dellia Nims per her note anyway that she wanted the patient off of the antibiotics for a week and then to repeat a culture in the outpatient setting. She also has a suspicion for the possibility of pyoderma and has referred the patient to Dr. Nehemiah Massed and the patient is supposed to be seeing him sometime I believe Thursday of this week. Nonetheless I explained that pyoderma can sometimes begin as a injury or insult that then worsens. Obviously the initial injury here appears to have been a scraping of her leg that occurred when the patient was being transferred off of the table after a kyphoplasty procedure. Fortunately again as mentioned the infection seems to be doing significantly better based on what I am seeing at this point compared to previous pictures that I reviewed today. 5/5; the patient is back to my clinic today after seeing with stone 2 days ago. She is on [employer] with a Kerlix Coban I have reviewed her recent history. We had sent her to the hospital with presumed cellulitis considerable erythema in the lateral leg.  She received prolonged courses of antibiotics in the hospital. I received a call from one of the hospitalist to report that he didn't feel that she was responding to antibiotics although as noted 2 days ago by Jeri Cos the wound actually looks a lot better along with the periwound skin which is not nearly as erythematous. He raised the possibility of pyoderma gangrenosum which also had been brought up by Dr. Steva Ready the infectious disease consultant. The discharge culture was negative but she was still on antibiotics The pyoderma gangrenosum apparently has something to do with the fact that both her sons have Crohn's disease although the patient does not have Crohn's disease. I told the doctor that call me I didn't really think this fit with the history which was a trauma induced wound why the patient was having a kyphoplasty. In fact her husband is still very angry about this with the hospital. She also has severe chronic venous insufficiency with hemosiderin and stasis dermatitis and has had wounds in both legs. 5/12; venous insufficiency wound on the right lower lateral lower leg initially trauma. Still debris on the surface of this wound that undergoes a difficult debridement. We have been using Hydrofera Blue 5/19; venous insufficiency wound on the right lower lateral leg initially trauma. Better looking wound surface and a rim of epithelialization. Surface area is smaller we have been using Hydrofera Blue under compression 03/17/20-Patient returns at 1 week, the wound is looking stable, dimensions are slightly smaller, continue Hydrofera Blue and Santyl under compression 6/2; venous insufficiency wound on the right lower leg initially traumatic. She has a considerably better looking wound surface than when I saw this 2 weeks ago. Some hyper granulation. We have been using Santyl with [facility] cover. I no longer think the Santyl is necessary we will just use Hydrofera Blue kerlix  Coban compression They are going to be traveling including to the [facility] and then subsequently a family vacation in [place]. Her daughter-in-law stated that they would be able to get her in here in 2 weeks before they go to [place] I think that would be good plan. We will show her how to do the dressing including the  Kerlix Coban 6/9; venous insufficiency wound on the right lower leg initially traumatic. Complicated by infection requiring hospitalization. Since then she has done very nicely and the wound has been getting progressively smaller we have been using Hydrofera Blue under Kerlix and Coban 6/16; venous insufficiency wound on the right lower leg initially traumatic. This was complicated by an infection with persistent cellulitis requiring a fairly protracted hospitalization. We are using Hydrofera Blue under Kerlix and Coban. The wound is contracting nicely. 6/30; venous insufficiency wound on the right lateral lower leg which was initially traumatic in the hospital has healed. Unfortunately the patient was vacationing in [place] last week had 2 falls in quick succession and by description of her daughter who is with her today she has a bimalleolar fracture in the right ankle. She is seen in [place]. She has a soft brace with an Ace wrap. The patient has dementia making weightbearing restrictions difficult. READMISSION 11/11/2021 This is a now 88 year old woman that we have seen in the past in this clinic for predominantly chronic venous insufficiency wounds on her lower extremities. On this occasion in mid December she suffered an assisted fall and as she was on Coumadin developed a hematoma. She was hospitalized from 12/15 to 10/11/2021 with a laceration injury to the left arm. She was treated with IV antibiotics. Notable that her hemoglobin dropped from 14.8-10.8 and if that was all in the arm that would have been a sizable amount of blood. Her daughter says that her  arm was black from the wrist up to just above where her wound is now. Ultimately the eschar has come off and there has been drainage but all the blood has been evacuated. They have been using Xeroform gauze, Geri sleeves and an Ace wrap. The patient has dementia and will tend to pick at dressings. They have home health changing the dressing 2 times a week [advanced] The patient is no longer on Coumadin and this was discontinued. She has very significant dementia which is much more advanced than I remember in 2021 even. She has tricuspid regurgitation mitral regurgitation and hypertension. 11/18/2021 upon evaluation today patient appears to be doing well with regard to the wound on her forearm. Fortunately she does not have any signs of infection at this time which is great news. She does have some hypergranulation. We do need to treat this again today I do believe. DEROTHA, FISHBAUGH ([id number]) Objective Constitutional Well-nourished and well-hydrated in no acute distress. Vitals Time Taken: 11:07 AM, Height: 60 in, Weight: 94 lbs, BMI: 18.4, Temperature: 98.2 F, Pulse: 77 bpm, Respiratory Rate: 18 breaths/min, Blood Pressure: 106/88 mmHg. Respiratory normal breathing without difficulty. Psychiatric this patient is able to make decisions and demonstrates good insight into disease process. Alert and Oriented x 3. pleasant and cooperative. General Notes: Upon inspection patient's wound ischial hypergranulation we did treat this with silver nitrate today which she tolerated without complication I did treat it pretty well to try to help flatten this out hopefully will see some really good improvement come next week. Integumentary (Hair, Skin) Wound #4 status is Open. Original cause of wound was Hematoma. The date acquired was: 10/06/2021. The wound has been in treatment 1 weeks. The wound is located on the Left,Anterior Upper Arm. The wound measures 4.5cm length x 0.7cm width x 0.5cm depth;  2.474cm^2 area and 1.237cm^3 volume. There is Fat Layer (Subcutaneous Tissue) exposed. There is no tunneling or undermining noted. There is a medium amount of serosanguineous drainage noted. There is  large (67-100%) red granulation within the wound bed. There is a small (1-33%) amount of necrotic tissue within the wound bed including Adherent Slough. Wound #5 status is Open. Original cause of wound was Hematoma. The date acquired was: 10/06/2021. The wound has been in treatment 1 weeks. The wound is located on the Left,Distal,Anterior Upper Arm. The wound measures 0cm length x 0cm width x 0cm depth; 0cm^2 area and 0cm^3 volume. There is no tunneling or undermining noted. There is a medium amount of serosanguineous drainage noted. There is large (67- 100%) hyper - granulation within the wound bed. There is no necrotic tissue within the wound bed. Assessment Active Problems ICD-10 Contusion of left upper arm, subsequent encounter Non-pressure chronic ulcer of skin of other sites with other specified severity Procedures Wound #4 Pre-procedure diagnosis of Wound #4 is a Trauma, Other located on the Left,Anterior Upper Arm . An CHEM CAUT GRANULATION TISS procedure was performed by Tommie Sams., PA-C. Post procedure Diagnosis Wound #4: Same as Pre-Procedure Plan Follow-up Appointments: Return Appointment in 1 week. Home Health: [facility] for wound care. May utilize formulary equivalent dressing for wound treatment orders unless otherwise specified. Home Health Nurse may visit PRN to address patient s wound care needs. - [facility] Bathing/ Shower/ Hygiene: May shower; gently cleanse wound with antibacterial soap, rinse and pat dry prior to dressing wounds Biggins, Keyani C. ([id number]) WOUND #4: - Upper Arm Wound Laterality: Left, Anterior Primary Dressing: Silvercel 4 1/4x 4 1/4 (in/in) 3 x Per Week/30 Days Discharge Instructions: pack lightly into undermining 6 oclock to  12 oclock Secondary Dressing: ABD Pad 5x9 (in/in) 3 x Per Week/30 Days Discharge Instructions: Cover with ABD pad Secondary Dressing: Kerlix 4.5 x 4.1 (in/yd) 3 x Per Week/30 Days Discharge Instructions: Apply Kerlix 4.5 x 4.1 (in/yd) as instructed Secured With: 20M Medipore H Soft Cloth Surgical Tape, 2x2 (in/yd) 3 x Per Week/30 Days Add-Ons: ace wrap and geri sleeve 3 x Per Week/30 Days 1. I would recommend that we going to continue with the wound care measures as before and the patient is in agreement with plan. This includes the use of the silver alginate dressing to the wound I think that is doing a good job. 2. We will continue with serial chemical cauterizations with a silver nitrate as we go forward hopefully that will help flatten out and get this wound to heal much more effectively and quickly. We will see patient back for reevaluation in 1 week here in the clinic. If anything worsens or changes patient will contact our office for additional recommendations. Electronic Signature(s) Signed: 11/18/2021 11:25:13 AM By: Worthy Keeler PA-C Entered By: Worthy Keeler on 11/18/2021 11:25:13 [name] ([id number]) -------------------------------------------------------------------------------- SuperBill Details [name] Date of Service: 11/18/2021 Medical Record Number: [id number] Patient Account Number: [id number] Date of Birth/Sex: Jul 04, 1926 (88 y.o. F) Treating RN: Carlene Coria Primary Care Provider: Derinda Late Other Clinician: Referring Provider: Derinda Late Treating Provider/Extender: Skipper Cliche in Treatment: 1 Diagnosis Coding ICD-10 Codes Code Description [phone] Contusion of left upper arm, subsequent encounter L98.498 Non-pressure chronic ulcer of skin of other sites with other specified severity Facility Procedures CPT4 Code: [id number] Description: 06237 - CHEM CAUT GRANULATION TISS Modifier: Quantity: 1 CPT4  Code: Description: ICD-10 Diagnosis Description L98.498 Non-pressure chronic ulcer of skin of other sites with other specified se Modifier: verity Quantity: Physician Procedures CPT4 Code: 6283151 Description: 76160 - WC PHYS CHEM CAUT GRAN TISSUE Modifier: Quantity: 1  CPT4 Code: Description: ICD-10 Diagnosis Description L98.498 Non-pressure chronic ulcer of skin of other sites with other specified sev Modifier: erity Quantity: Electronic Signature(s) Signed: 11/18/2021 11:25:29 AM By: Worthy Keeler PA-C Entered By: Worthy Keeler on 11/18/2021 11:25:29

## 2021-11-28 ENCOUNTER — Other Ambulatory Visit: Payer: Self-pay

## 2021-11-28 ENCOUNTER — Encounter: Payer: Medicare Other | Attending: Physician Assistant | Admitting: Physician Assistant

## 2021-11-28 DIAGNOSIS — Z7901 Long term (current) use of anticoagulants: Secondary | ICD-10-CM | POA: Diagnosis not present

## 2021-11-28 DIAGNOSIS — X58XXXA Exposure to other specified factors, initial encounter: Secondary | ICD-10-CM | POA: Insufficient documentation

## 2021-11-28 DIAGNOSIS — G309 Alzheimer's disease, unspecified: Secondary | ICD-10-CM | POA: Insufficient documentation

## 2021-11-28 DIAGNOSIS — I10 Essential (primary) hypertension: Secondary | ICD-10-CM | POA: Diagnosis not present

## 2021-11-28 DIAGNOSIS — I482 Chronic atrial fibrillation, unspecified: Secondary | ICD-10-CM | POA: Diagnosis not present

## 2021-11-28 DIAGNOSIS — F0280 Dementia in other diseases classified elsewhere without behavioral disturbance: Secondary | ICD-10-CM | POA: Diagnosis not present

## 2021-11-28 DIAGNOSIS — S40022A Contusion of left upper arm, initial encounter: Secondary | ICD-10-CM | POA: Diagnosis present

## 2021-11-28 DIAGNOSIS — I872 Venous insufficiency (chronic) (peripheral): Secondary | ICD-10-CM | POA: Insufficient documentation

## 2021-11-28 DIAGNOSIS — L98498 Non-pressure chronic ulcer of skin of other sites with other specified severity: Secondary | ICD-10-CM | POA: Diagnosis not present

## 2021-11-28 NOTE — Progress Notes (Addendum)
[name] ([id number]) Visit Report for 11/28/2021 Chief Complaint Document Details [name], [name]. Date of Service: 11/28/2021 11:00 AM Medical Record Number: [id number] Patient Account Number: [id number] Date of Birth/Sex: 1926-10-15 (88 y.o. F) Treating RN: Carlene Coria Primary Care Provider: Derinda Late Other Clinician: Referring Provider: Derinda Late Treating Provider/Extender: Skipper Cliche in Treatment: 2 Information Obtained from: Patient Chief Complaint Left anterior shin ulcer 01/28/2020; patient is here for review of wound on her right anterior lower shin 11/11/2021; the patient is here for review of wound secondary to a hematoma on the left forearm Electronic Signature(s) Signed: 11/28/2021 11:15:34 AM By: Worthy Keeler PA-C Entered By: Worthy Keeler on 11/28/2021 11:15:34 [name] ([id number]) -------------------------------------------------------------------------------- HPI Details [name]. Date of Service: 11/28/2021 11:00 AM Medical Record Number: [id number] Patient Account Number: [id number] Date of Birth/Sex: Nov 02, 1925 (88 y.o. F) Treating RN: Carlene Coria Primary Care Provider: Derinda Late Other Clinician: Referring Provider: Derinda Late Treating Provider/Extender: Skipper Cliche in Treatment: 2 History of Present Illness HPI Description: 06/06/17 on evaluation today patient presents for evaluation concerning an ulcer which she initially had arise as a result of striking her left lower extremity money bed frame. With that being said this was roughly one month ago and unfortunately the wound despite two courses of Keflex have not really made a dramatic improvement. This has continued to drain as well as being exquisitely tender at times. Even to the point that she is having a difficult time walking. Patient does have chronic atrial fibrillation which subsequently leads to her being on  long-term anticoagulant therapy and this causes ED bruising which may have contributed to this entry and where things are at this point. She does have valvular heart disease as well as hypertension and her last INR was 2.7. Currently Neosporin, Gauls, and an ace wrap has been utilized. Keflex is the antibiotic that she has been on. Patient has not had a culture up to this point and she states that her blood pressure at the primary care office yesterday was 138/72. Her pain is significant related to be an eight out of 10. No fevers, chills, nausea, or vomiting noted at this time. 06/20/17; patient was admitted here 2 weeks ago. She had a traumatic wound on her left lateral lower leg probably in the setting of some degree of venous insufficiency with surrounding cellulitis. She had been on Keflex, after we saw her she was put on doxycycline which she apparently did not tolerate. Culture that was done at the time showed Morganella which was resistant to Keflex. Apparently the patient continued to take Keflex after the appointment. She is also on Coumadin. I had tried to change her antibiotic when the Morganella culture was brought to my attention however apparently our staff could not reach the patient to inform them of the antibiotic change 06/26/17; patient has wound on her left lateral lower extremity in the setting of some degree of venous insufficiency. I gave her cefdinir last week and she is completing this. There is no evidence of surrounding infection. Aggressive debridement last week, wound bed looks healthier. We've been using Aquacel. They're traveling in the [place] next week we'll see her back in 2 weeks unless there are problems. 07/10/17; the patient is been using Aquacel Ag her husband is changing this with Kerlix and conform. She still complains of a lot of pain. Apparently with the antibiotics. We gave 2 or 3 weeks ago. Her INR went up  to 6, this was managed by her primary  physician 07/17/17; she is using Aquacel Ag and a border foam. Still discomfort although dimensions are better. 07/24/17; patient or for review of a trauma wound on her left anterior leg in the setting of some degree of chronic venous insufficiency she has been using Aquacel Ag and a border foam and making progress. 07/31/17; only a small open area of this original significant trauma is still open. Her husband is changing this every second day [Aquacel Ag] Readmission: 01/14/19 on evaluation today patient is that she seen for initial inspection during the office visit today concerning a trauma/skin tear to the left anterior lower extremity. This is roughly the same region where she was previously treated and years past when she was here in the clinic. Nonetheless upon evaluation today the patient's wound actually showed signs of decent granulation around the edge although unfortunately the skin flap that was torn back did fold under on itself and the flap itself is dying. She does have some eschar surrounded the edges of the wound at this point. Nonetheless this also was very tender for her. The patient does have a history of hypertension, atrial fibrillation, and long-term use anticoagulant therapy. 01/21/19 on evaluation today patient's wound on the anterior portion of her lower extremity appears to still be necrotic as far as the surface of the wound is concerned. Unfortunately there is no significant improvement at this point compared to last week they were not able to get the Santyl that are using Medihoney and maybe one reason why. The other is I think this may be stained to drive. Possibly adding something such as mepitel over top of the Medihoney may help to loosen this up quite a bit which I think would be in her best interest. Also she still has a lot of erythema and is very tender to touch I'm afraid that we may need to switch to a different antibiotic to see if this will be of benefit there  still really nothing that I can culture to get a better picture of what's going on and what we need to do from the standpoint of antibiotics. I do believe the patient needs a referral Tal[place]uds me to vascular in order to evaluate her blood flow based on what I'm seeing they will be able to perform TBI testing if nothing else along with the vascular evaluation to see if there's any evidence for limited blood flow to this lower extremity. 4/8; I have reviewed the patient's arterial studies and they are really quite good. There should be no arterial impediments to healing the wound on the right anterior tibial area. 4/15; patient has a small open area over the left mid tibial area. Still requiring debridement we are using Santyl here and Medihoney at home [Santyl unaffordable] 4/22; difficult, presumably traumatic area over the left mid tibial area in the setting of chronic venous insufficiency. We have been using Santyl here and meta honey at home. The wound surface is getting gradually better and slight improvement in dimensions. Her husband is changing this daily 4/29; left mid tibia presumably traumatic wound in the setting of chronic venous insufficiency. We have been using Santyl and a combination of Medihoney at home. Arrives today with a much better looking healthy granulated surface. We will change the primary dressing to [facility] 5/6; left mid tibia smaller reasonably healthy looking wound using Hydrofera Blue since last week 5/20-Patient returns to clinic with a left mid tibial wound looking  better, we have been using Hydrofera Blue since the previous week 5/27; patient returns to clinic with a left mid tibial wound covered in raised eschar. We have been using Hydrofera Blue her husband changes the dressings using a border foam cover READMISSION 01/28/20 [name] is a patient who is now 88 years old. As usual she comes in with her husband who is her primary caregiver. They were here  last from March through May 2020 with a traumatic wound on the left anterior tibia we eventually heal this out. She has chronic venous insufficiency. She is also here previously in 2018. Her current problem started 8 days ago. She went in the hospital for a thoracic level kyphoplasty. The procedure she developed a skin tear on her leg. This was mentioned to her. Her husband is applying Medihoney to the area and they are in for our review of this. She finds this much too uncomfortable to attempt ABIs although her ABIs when she was in the clinic last year were quite normal bilaterally [name], [name] ([id number]) Past medical history she is not a diabetic. As noted she had a T9 and T6 kyphoplasty, Alzheimer's disease, chronic atrial fibrillation on Coumadin and hypertension 4/14; patient arrives in clinic having removed part of the wrap. We attempted to leave this on all week. Unfortunately none that none of the tissue that looks semiviable last week has remained viable. She required an extensive debridement. We have been using silver alginate 4/21; the patient arrived today with tremendous erythema around the large part of the wound on the right lateral leg. This extends medially and down towards her foot. I marked the area but I think the extent of the likely cellulitis is beyond what can be safely managed as an outpatient. 02/23/2020 upon evaluation today patient is seen today though she is typically followed by Dr. Dellia Nims. Last time he saw her he sent her to the hospital and she subsequently was in the hospital for some time. In fact it was from 02/11/2020 through 02/19/2020. During that time she was on antibiotic therapy by way of IV antibiotics. Her leg does appear to be doing better today compared to what we saw then. Nonetheless she had a culture that was obtained while she was in the hospital by Dr. Steva Ready. This ended up not growing anything which is not really surprising considering she  was on antibiotics which were fairly strong at the time of culture. There was no growth at all after 2 days. With that being said Dr. Steva Ready apparently discussed with Dr. Dellia Nims per her note anyway that she wanted the patient off of the antibiotics for a week and then to repeat a culture in the outpatient setting. She also has a suspicion for the possibility of pyoderma and has referred the patient to Dr. Nehemiah Massed and the patient is supposed to be seeing him sometime I believe Thursday of this week. Nonetheless I explained that pyoderma can sometimes begin as a injury or insult that then worsens. Obviously the initial injury here appears to have been a scraping of her leg that occurred when the patient was being transferred off of the table after a kyphoplasty procedure. Fortunately again as mentioned the infection seems to be doing significantly better based on what I am seeing at this point compared to previous pictures that I reviewed today. 5/5; the patient is back to my clinic today after seeing with stone 2 days ago. She is on [employer] with a Kerlix Coban I  have reviewed her recent history. We had sent her to the hospital with presumed cellulitis considerable erythema in the lateral leg. She received prolonged courses of antibiotics in the hospital. I received a call from one of the hospitalist to report that he didn't feel that she was responding to antibiotics although as noted 2 days ago by Jeri Cos the wound actually looks a lot better along with the periwound skin which is not nearly as erythematous. He raised the possibility of pyoderma gangrenosum which also had been brought up by Dr. Steva Ready the infectious disease consultant. The discharge culture was negative but she was still on antibiotics The pyoderma gangrenosum apparently has something to do with the fact that both her sons have Crohn's disease although the patient does not have Crohn's disease. I told the doctor that  call me I didn't really think this fit with the history which was a trauma induced wound why the patient was having a kyphoplasty. In fact her husband is still very angry about this with the hospital. She also has severe chronic venous insufficiency with hemosiderin and stasis dermatitis and has had wounds in both legs. 5/12; venous insufficiency wound on the right lower lateral lower leg initially trauma. Still debris on the surface of this wound that undergoes a difficult debridement. We have been using Hydrofera Blue 5/19; venous insufficiency wound on the right lower lateral leg initially trauma. Better looking wound surface and a rim of epithelialization. Surface area is smaller we have been using Hydrofera Blue under compression 03/17/20-Patient returns at 1 week, the wound is looking stable, dimensions are slightly smaller, continue Hydrofera Blue and Santyl under compression 6/2; venous insufficiency wound on the right lower leg initially traumatic. She has a considerably better looking wound surface than when I saw this 2 weeks ago. Some hyper granulation. We have been using Santyl with [facility] cover. I no longer think the Santyl is necessary we will just use Hydrofera Blue kerlix Coban compression They are going to be traveling including to the [facility] and then subsequently a family vacation in [place]. Her daughter-in-law stated that they would be able to get her in here in 2 weeks before they go to [place] I think that would be good plan. We will show her how to do the dressing including the Kerlix Coban 6/9; venous insufficiency wound on the right lower leg initially traumatic. Complicated by infection requiring hospitalization. Since then she has done very nicely and the wound has been getting progressively smaller we have been using Hydrofera Blue under Kerlix and Coban 6/16; venous insufficiency wound on the right lower leg initially traumatic. This was  complicated by an infection with persistent cellulitis requiring a fairly protracted hospitalization. We are using Hydrofera Blue under Kerlix and Coban. The wound is contracting nicely. 6/30; venous insufficiency wound on the right lateral lower leg which was initially traumatic in the hospital has healed. Unfortunately the patient was vacationing in [place] last week had 2 falls in quick succession and by description of her daughter who is with her today she has a bimalleolar fracture in the right ankle. She is seen in [place]. She has a soft brace with an Ace wrap. The patient has dementia making weightbearing restrictions difficult. READMISSION 11/11/2021 This is a now 88 year old woman that we have seen in the past in this clinic for predominantly chronic venous insufficiency wounds on her lower extremities. On this occasion in mid December she suffered an assisted fall and as she was  on Coumadin developed a hematoma. She was hospitalized from 12/15 to 10/11/2021 with a laceration injury to the left arm. She was treated with IV antibiotics. Notable that her hemoglobin dropped from 14.8-10.8 and if that was all in the arm that would have been a sizable amount of blood. Her daughter says that her arm was black from the wrist up to just above where her wound is now. Ultimately the eschar has come off and there has been drainage but all the blood has been evacuated. They have been using Xeroform gauze, Geri sleeves and an Ace wrap. The patient has dementia and will tend to pick at dressings. They have home health changing the dressing 2 times a week [advanced] The patient is no longer on Coumadin and this was discontinued. She has very significant dementia which is much more advanced than I remember in 2021 even. She has tricuspid regurgitation mitral regurgitation and hypertension. 11/18/2021 upon evaluation today patient appears to be doing well with regard to the wound on her forearm.  Fortunately she does not have any signs of infection at this time which is great news. She does have some hypergranulation. We do need to treat this again today I do believe. 11/28/2021 upon evaluation today patient appears to be doing well with regard to her wound. In fact this is very close to complete resolution. I think just a little Xeroform over a week will probably see this completely closed, next week. Electronic Signature(s) Signed: 11/28/2021 11:28:01 AM By: Worthy Keeler PA-C Entered By: Worthy Keeler on 11/28/2021 11:28:01 [name], [name] ([id number], [name] ([id number]) -------------------------------------------------------------------------------- Physical Exam Details [name], [name]. Date of Service: 11/28/2021 11:00 AM Medical Record Number: [id number] Patient Account Number: [id number] Date of Birth/Sex: 07/10/1926 (88 y.o. F) Treating RN: Carlene Coria Primary Care Provider: Derinda Late Other Clinician: Referring Provider: Derinda Late Treating Provider/Extender: Skipper Cliche in Treatment: 2 Constitutional Well-nourished and well-hydrated in no acute distress. Respiratory normal breathing without difficulty. Psychiatric this patient is able to make decisions and demonstrates good insight into disease process. Alert and Oriented x 3. pleasant and cooperative. Notes Upon inspection patient's wound bed actually showed signs of good granulation and epithelization at this point. I do not see any signs of active infection locally or systemically which is great news and overall I am extremely pleased with where we stand. Electronic Signature(s) Signed: 11/28/2021 11:28:17 AM By: Worthy Keeler PA-C Entered By: Worthy Keeler on 11/28/2021 11:28:17 [name] ([id number]) -------------------------------------------------------------------------------- Physician Orders Details [name] Date of Service:  11/28/2021 11:00 AM Medical Record Number: [id number] Patient Account Number: [id number] Date of Birth/Sex: 07-27-1926 (88 y.o. F) Treating RN: Carlene Coria Primary Care Provider: Derinda Late Other Clinician: Referring Provider: Derinda Late Treating Provider/Extender: Skipper Cliche in Treatment: 2 Verbal / Phone Orders: No Diagnosis Coding ICD-10 Coding Code Description S40.022D Contusion of left upper arm, subsequent encounter L98.498 Non-pressure chronic ulcer of skin of other sites with other specified severity Follow-up Appointments o Return Appointment in 1 week. [place] for wound care. May utilize formulary equivalent dressing for wound treatment orders unless otherwise specified. Home Health Nurse may visit PRN to address patientos wound care needs. - [facility] Bathing/ Shower/ Hygiene o May shower; gently cleanse wound with antibacterial soap, rinse and pat dry prior to dressing wounds Wound Treatment Wound #4 - Upper Arm Wound Laterality: Left, Anterior Primary Dressing: Xeroform-HBD 2x2 (in/in) 3  x Per Week/30 Days Discharge Instructions: multiple layers Secondary Dressing: ABD Pad 5x9 (in/in) 3 x Per Week/30 Days Discharge Instructions: Cover with ABD pad Secondary Dressing: Kerlix 4.5 x 4.1 (in/yd) 3 x Per Week/30 Days Discharge Instructions: Apply Kerlix 4.5 x 4.1 (in/yd) as instructed Secured With: 74M Medipore H Soft Cloth Surgical Tape, 2x2 (in/yd) 3 x Per Week/30 Days Add-Ons: ace wrap and geri sleeve 3 x Per Week/30 Days Electronic Signature(s) Signed: 11/29/2021 10:02:31 AM By: Worthy Keeler PA-C Signed: 12/01/2021 8:26:07 AM By: Carlene Coria RN Entered By: Carlene Coria on 11/28/2021 11:19:03 Garrott, [name] ([id number]) -------------------------------------------------------------------------------- Problem List Details [name]. Date of Service: 11/28/2021 11:00 AM Medical Record Number:  [id number] Patient Account Number: [id number] Date of Birth/Sex: 13-Apr-1926 (88 y.o. F) Treating RN: Carlene Coria Primary Care Provider: Derinda Late Other Clinician: Referring Provider: Derinda Late Treating Provider/Extender: Skipper Cliche in Treatment: 2 Active Problems ICD-10 Encounter Code Description Active Date MDM Diagnosis S40.022D Contusion of left upper arm, subsequent encounter 11/11/2021 No Yes L98.498 Non-pressure chronic ulcer of skin of other sites with other specified 11/11/2021 No Yes severity Inactive Problems Resolved Problems Electronic Signature(s) Signed: 11/28/2021 11:15:22 AM By: Worthy Keeler PA-C Entered By: Worthy Keeler on 11/28/2021 11:15:22 Hoh, [name] ([id number]) -------------------------------------------------------------------------------- Progress Note Details [name]. Date of Service: 11/28/2021 11:00 AM Medical Record Number: [id number] Patient Account Number: [id number] Date of Birth/Sex: 05/04/1926 (88 y.o. F) Treating RN: Carlene Coria Primary Care Provider: Derinda Late Other Clinician: Referring Provider: Derinda Late Treating Provider/Extender: Skipper Cliche in Treatment: 2 Subjective Chief Complaint Information obtained from Patient Left anterior shin ulcer 01/28/2020; patient is here for review of wound on her right anterior lower shin 11/11/2021; the patient is here for review of wound secondary to a hematoma on the left forearm History of Present Illness (HPI) 06/06/17 on evaluation today patient presents for evaluation concerning an ulcer which she initially had arise as a result of striking her left lower extremity money bed frame. With that being said this was roughly one month ago and unfortunately the wound despite two courses of Keflex have not really made a dramatic improvement. This has continued to drain as well as being exquisitely tender at times. Even to the point that she is  having a difficult time walking. Patient does have chronic atrial fibrillation which subsequently leads to her being on long-term anticoagulant therapy and this causes ED bruising which may have contributed to this entry and where things are at this point. She does have valvular heart disease as well as hypertension and her last INR was 2.7. Currently Neosporin, Gauls, and an ace wrap has been utilized. Keflex is the antibiotic that she has been on. Patient has not had a culture up to this point and she states that her blood pressure at the primary care office yesterday was 138/72. Her pain is significant related to be an eight out of 10. No fevers, chills, nausea, or vomiting noted at this time. 06/20/17; patient was admitted here 2 weeks ago. She had a traumatic wound on her left lateral lower leg probably in the setting of some degree of venous insufficiency with surrounding cellulitis. She had been on Keflex, after we saw her she was put on doxycycline which she apparently did not tolerate. Culture that was done at the time showed Morganella which was resistant to Keflex. Apparently the patient continued to take Keflex after the appointment. She is also on Coumadin.  I had tried to change her antibiotic when the Morganella culture was brought to my attention however apparently our staff could not reach the patient to inform them of the antibiotic change 06/26/17; patient has wound on her left lateral lower extremity in the setting of some degree of venous insufficiency. I gave her cefdinir last week and she is completing this. There is no evidence of surrounding infection. Aggressive debridement last week, wound bed looks healthier. We've been using Aquacel. They're traveling in the [place] next week we'll see her back in 2 weeks unless there are problems. 07/10/17; the patient is been using Aquacel Ag her husband is changing this with Kerlix and conform. She still complains of a lot of  pain. Apparently with the antibiotics. We gave 2 or 3 weeks ago. Her INR went up to 6, this was managed by her primary physician 07/17/17; she is using Aquacel Ag and a border foam. Still discomfort although dimensions are better. 07/24/17; patient or for review of a trauma wound on her left anterior leg in the setting of some degree of chronic venous insufficiency she has been using Aquacel Ag and a border foam and making progress. 07/31/17; only a small open area of this original significant trauma is still open. Her husband is changing this every second day [Aquacel Ag] Readmission: 01/14/19 on evaluation today patient is that she seen for initial inspection during the office visit today concerning a trauma/skin tear to the left anterior lower extremity. This is roughly the same region where she was previously treated and years past when she was here in the clinic. Nonetheless upon evaluation today the patient's wound actually showed signs of decent granulation around the edge although unfortunately the skin flap that was torn back did fold under on itself and the flap itself is dying. She does have some eschar surrounded the edges of the wound at this point. Nonetheless this also was very tender for her. The patient does have a history of hypertension, atrial fibrillation, and long-term use anticoagulant therapy. 01/21/19 on evaluation today patient's wound on the anterior portion of her lower extremity appears to still be necrotic as far as the surface of the wound is concerned. Unfortunately there is no significant improvement at this point compared to last week they were not able to get the Santyl that are using Medihoney and maybe one reason why. The other is I think this may be stained to drive. Possibly adding something such as mepitel over top of the Medihoney may help to loosen this up quite a bit which I think would be in her best interest. Also she still has a lot of erythema and is very  tender to touch I'm afraid that we may need to switch to a different antibiotic to see if this will be of benefit there still really nothing that I can culture to get a better picture of what's going on and what we need to do from the standpoint of antibiotics. I do believe the patient needs a referral Talmuds me to vascular in order to evaluate her blood flow based on what I'm seeing they will be able to perform TBI testing if nothing else along with the vascular evaluation to see if there's any evidence for limited blood flow to this lower extremity. 4/8; I have reviewed the patient's arterial studies and they are really quite good. There should be no arterial impediments to healing the wound on the right anterior tibial area. 4/15; patient has a small open  area over the left mid tibial area. Still requiring debridement we are using Santyl here and Medihoney at home [Santyl unaffordable] 4/22; difficult, presumably traumatic area over the left mid tibial area in the setting of chronic venous insufficiency. We have been using Santyl here and meta honey at home. The wound surface is getting gradually better and slight improvement in dimensions. Her husband is changing this daily 4/29; left mid tibia presumably traumatic wound in the setting of chronic venous insufficiency. We have been using Santyl and a combination of Medihoney at home. Arrives today with a much better looking healthy granulated surface. We will change the primary dressing to [facility] 5/6; left mid tibia smaller reasonably healthy looking wound using Hydrofera Blue since last week 5/20-Patient returns to clinic with a left mid tibial wound looking better, we have been using Hydrofera Blue since the previous week 5/27; patient returns to clinic with a left mid tibial wound covered in raised eschar. We have been using Hydrofera Blue her husband changes the dressings using a border foam cover READMISSION 01/28/20 Mrs. Umland is  a patient who is now 88 years old. As usual she comes in with her husband who is her primary caregiver. They were here last from March through May 2020 with a traumatic wound on the left anterior tibia we eventually heal this out. She has chronic venous insufficiency. She is [name], [name] ([id number]) also here previously in 2018. Her current problem started 8 days ago. She went in the hospital for a thoracic level kyphoplasty. The procedure she developed a skin tear on her leg. This was mentioned to her. Her husband is applying Medihoney to the area and they are in for our review of this. She finds this much too uncomfortable to attempt ABIs although her ABIs when she was in the clinic last year were quite normal bilaterally Past medical history she is not a diabetic. As noted she had a T9 and T6 kyphoplasty, Alzheimer's disease, chronic atrial fibrillation on Coumadin and hypertension 4/14; patient arrives in clinic having removed part of the wrap. We attempted to leave this on all week. Unfortunately none that none of the tissue that looks semiviable last week has remained viable. She required an extensive debridement. We have been using silver alginate 4/21; the patient arrived today with tremendous erythema around the large part of the wound on the right lateral leg. This extends medially and down towards her foot. I marked the area but I think the extent of the likely cellulitis is beyond what can be safely managed as an outpatient. 02/23/2020 upon evaluation today patient is seen today though she is typically followed by Dr. Dellia Nims. Last time he saw her he sent her to the hospital and she subsequently was in the hospital for some time. In fact it was from 02/11/2020 through 02/19/2020. During that time she was on antibiotic therapy by way of IV antibiotics. Her leg does appear to be doing better today compared to what we saw then. Nonetheless she had a culture that was obtained while she was  in the hospital by Dr. Steva Ready. This ended up not growing anything which is not really surprising considering she was on antibiotics which were fairly strong at the time of culture. There was no growth at all after 2 days. With that being said Dr. Steva Ready apparently discussed with Dr. Dellia Nims per her note anyway that she wanted the patient off of the antibiotics for a week and then to repeat a culture  in the outpatient setting. She also has a suspicion for the possibility of pyoderma and has referred the patient to Dr. Nehemiah Massed and the patient is supposed to be seeing him sometime I believe Thursday of this week. Nonetheless I explained that pyoderma can sometimes begin as a injury or insult that then worsens. Obviously the initial injury here appears to have been a scraping of her leg that occurred when the patient was being transferred off of the table after a kyphoplasty procedure. Fortunately again as mentioned the infection seems to be doing significantly better based on what I am seeing at this point compared to previous pictures that I reviewed today. 5/5; the patient is back to my clinic today after seeing with stone 2 days ago. She is on [employer] with a Kerlix Coban I have reviewed her recent history. We had sent her to the hospital with presumed cellulitis considerable erythema in the lateral leg. She received prolonged courses of antibiotics in the hospital. I received a call from one of the hospitalist to report that he didn't feel that she was responding to antibiotics although as noted 2 days ago by Jeri Cos the wound actually looks a lot better along with the periwound skin which is not nearly as erythematous. He raised the possibility of pyoderma gangrenosum which also had been brought up by Dr. Steva Ready the infectious disease consultant. The discharge culture was negative but she was still on antibiotics The pyoderma gangrenosum apparently has something to do with the  fact that both her sons have Crohn's disease although the patient does not have Crohn's disease. I told the doctor that call me I didn't really think this fit with the history which was a trauma induced wound why the patient was having a kyphoplasty. In fact her husband is still very angry about this with the hospital. She also has severe chronic venous insufficiency with hemosiderin and stasis dermatitis and has had wounds in both legs. 5/12; venous insufficiency wound on the right lower lateral lower leg initially trauma. Still debris on the surface of this wound that undergoes a difficult debridement. We have been using Hydrofera Blue 5/19; venous insufficiency wound on the right lower lateral leg initially trauma. Better looking wound surface and a rim of epithelialization. Surface area is smaller we have been using Hydrofera Blue under compression 03/17/20-Patient returns at 1 week, the wound is looking stable, dimensions are slightly smaller, continue Hydrofera Blue and Santyl under compression 6/2; venous insufficiency wound on the right lower leg initially traumatic. She has a considerably better looking wound surface than when I saw this 2 weeks ago. Some hyper granulation. We have been using Santyl with [facility] cover. I no longer think the Santyl is necessary we will just use Hydrofera Blue kerlix Coban compression They are going to be traveling including to the [facility] and then subsequently a family vacation in [place]. Her daughter-in-law stated that they would be able to get her in here in 2 weeks before they go to [place] I think that would be good plan. We will show her how to do the dressing including the Kerlix Coban 6/9; venous insufficiency wound on the right lower leg initially traumatic. Complicated by infection requiring hospitalization. Since then she has done very nicely and the wound has been getting progressively smaller we have been using Hydrofera  Blue under Kerlix and Coban 6/16; venous insufficiency wound on the right lower leg initially traumatic. This was complicated by an infection with persistent  cellulitis requiring a fairly protracted hospitalization. We are using Hydrofera Blue under Kerlix and Coban. The wound is contracting nicely. 6/30; venous insufficiency wound on the right lateral lower leg which was initially traumatic in the hospital has healed. Unfortunately the patient was vacationing in [place] last week had 2 falls in quick succession and by description of her daughter who is with her today she has a bimalleolar fracture in the right ankle. She is seen in [place]. She has a soft brace with an Ace wrap. The patient has dementia making weightbearing restrictions difficult. READMISSION 11/11/2021 This is a now 88 year old woman that we have seen in the past in this clinic for predominantly chronic venous insufficiency wounds on her lower extremities. On this occasion in mid December she suffered an assisted fall and as she was on Coumadin developed a hematoma. She was hospitalized from 12/15 to 10/11/2021 with a laceration injury to the left arm. She was treated with IV antibiotics. Notable that her hemoglobin dropped from 14.8-10.8 and if that was all in the arm that would have been a sizable amount of blood. Her daughter says that her arm was black from the wrist up to just above where her wound is now. Ultimately the eschar has come off and there has been drainage but all the blood has been evacuated. They have been using Xeroform gauze, Geri sleeves and an Ace wrap. The patient has dementia and will tend to pick at dressings. They have home health changing the dressing 2 times a week [advanced] The patient is no longer on Coumadin and this was discontinued. She has very significant dementia which is much more advanced than I remember in 2021 even. She has tricuspid regurgitation mitral regurgitation and  hypertension. 11/18/2021 upon evaluation today patient appears to be doing well with regard to the wound on her forearm. Fortunately she does not have any signs of infection at this time which is great news. She does have some hypergranulation. We do need to treat this again today I do believe. 11/28/2021 upon evaluation today patient appears to be doing well with regard to her wound. In fact this is very close to complete resolution. I think just a little Xeroform over a week will probably see this completely closed, next week. [name], [name] ([id number]) Objective Constitutional Well-nourished and well-hydrated in no acute distress. Vitals Time Taken: 11:05 AM, Height: 60 in, Weight: 94 lbs, BMI: 18.4, Temperature: 97.2 F, Pulse: 71 bpm, Respiratory Rate: 18 breaths/min, Blood Pressure: 145/73 mmHg. Respiratory normal breathing without difficulty. Psychiatric this patient is able to make decisions and demonstrates good insight into disease process. Alert and Oriented x 3. pleasant and cooperative. General Notes: Upon inspection patient's wound bed actually showed signs of good granulation and epithelization at this point. I do not see any signs of active infection locally or systemically which is great news and overall I am extremely pleased with where we stand. Integumentary (Hair, Skin) Wound #4 status is Open. Original cause of wound was Hematoma. The date acquired was: 10/06/2021. The wound has been in treatment 2 weeks. The wound is located on the Left,Anterior Upper Arm. The wound measures 2cm length x 0.1cm width x 0.1cm depth; 0.157cm^2 area and 0.016cm^3 volume. There is Fat Layer (Subcutaneous Tissue) exposed. There is no tunneling or undermining noted. There is a medium amount of serosanguineous drainage noted. There is large (67-100%) red granulation within the wound bed. There is no necrotic tissue within the wound bed. Assessment Active Problems ICD-10 Contusion  of left  upper arm, subsequent encounter Non-pressure chronic ulcer of skin of other sites with other specified severity Plan Follow-up Appointments: Return Appointment in 1 week. Home Health: [facility] for wound care. May utilize formulary equivalent dressing for wound treatment orders unless otherwise specified. Home Health Nurse may visit PRN to address patient s wound care needs. - [facility] Bathing/ Shower/ Hygiene: May shower; gently cleanse wound with antibacterial soap, rinse and pat dry prior to dressing wounds WOUND #4: - Upper Arm Wound Laterality: Left, Anterior Primary Dressing: Xeroform-HBD 2x2 (in/in) 3 x Per Week/30 Days Discharge Instructions: multiple layers Secondary Dressing: ABD Pad 5x9 (in/in) 3 x Per Week/30 Days Discharge Instructions: Cover with ABD pad Secondary Dressing: Kerlix 4.5 x 4.1 (in/yd) 3 x Per Week/30 Days Discharge Instructions: Apply Kerlix 4.5 x 4.1 (in/yd) as instructed Secured With: 31M Medipore H Soft Cloth Surgical Tape, 2x2 (in/yd) 3 x Per Week/30 Days Add-Ons: ace wrap and geri sleeve 3 x Per Week/30 Days 1. Recommend we go and continue with the wound care measures as before and the patient is in agreement with the plan. This includes the use of the Xeroform gauze dressings which I think would do a good job for her. 2. We will continue with roll gauze to secure in place along with an ABD pad. [name], [name] ([id number]) We will see patient back for reevaluation in 1 week here in the clinic. If anything worsens or changes patient will contact our office for additional recommendations. Electronic Signature(s) Signed: 11/28/2021 11:28:52 AM By: Worthy Keeler PA-C Entered By: Worthy Keeler on 11/28/2021 11:28:52 [name] ([id number]) -------------------------------------------------------------------------------- SuperBill Details [name] Date of Service: 11/28/2021 Medical Record Number:  [id number] Patient Account Number: [id number] Date of Birth/Sex: 05-Apr-1926 (88 y.o. F) Treating RN: Carlene Coria Primary Care Provider: Derinda Late Other Clinician: Referring Provider: Derinda Late Treating Provider/Extender: Skipper Cliche in Treatment: 2 Diagnosis Coding ICD-10 Codes Code Description [phone] Contusion of left upper arm, subsequent encounter L98.498 Non-pressure chronic ulcer of skin of other sites with other specified severity Facility Procedures CPT4 Code: [id number] Description: [phone] - WOUND CARE VISIT-LEV 2 EST PT Modifier: Quantity: 1 Physician Procedures CPT4 Code: [id number] Description: 76734 - WC PHYS LEVEL 3 - EST PT Modifier: Quantity: 1 CPT4 Code: Description: ICD-10 Diagnosis Description S40.022D Contusion of left upper arm, subsequent encounter L98.498 Non-pressure chronic ulcer of skin of other sites with other specified s Modifier: everity Quantity: Electronic Signature(s) Signed: 11/28/2021 11:29:19 AM By: Worthy Keeler PA-C Entered By: Worthy Keeler on 11/28/2021 11:29:19

## 2021-12-01 NOTE — Progress Notes (Addendum)
Brittany, [name] ([id number]) Visit Report for 11/28/2021 Arrival Information Details Patient Name: Brittany, [name]. Date of Service: 11/28/2021 11:00 AM Medical Record Number: [id number] Patient Account Number: [id number] Date of Birth/Sex: [birth year] (88 y.o. F) Treating RN: Carlene Coria Primary Care Scarlet Abad: Derinda Late Other Clinician: Referring Tito Ausmus: Derinda Late Treating Kristion Holifield/Extender: Skipper Cliche in Treatment: 2 Visit Information History Since Last Visit All ordered tests and consults were completed: No Patient Arrived: Ambulatory Added or deleted any medications: No Arrival Time: 11:00 Any new allergies or adverse reactions: No Accompanied By: self Had a fall or experienced change in No Transfer Assistance: None activities of daily living that may affect Patient Identification Verified: Yes risk of falls: Secondary Verification Process Completed: Yes Signs or symptoms of abuse/neglect since last visito No Patient Requires Transmission-Based Precautions: No Hospitalized since last visit: No Patient Has Alerts: No Implantable device outside of the clinic excluding No cellular tissue based products placed in the center since last visit: Has Dressing in Place as Prescribed: Yes Pain Present Now: No Electronic Signature(s) Signed: 12/01/2021 8:26:07 AM By: Carlene Coria RN Entered By: Carlene Coria on 11/28/2021 11:05:29 [name], [name] ([id number]) -------------------------------------------------------------------------------- Clinic Level of Care Assessment Details [name]. Date of Service: 11/28/2021 11:00 AM Medical Record Number: [id number] Patient Account Number: [id number] Date of Birth/Sex: Feb 05, 1926 (88 y.o. F) Treating RN: Carlene Coria Primary Care Erving Sassano: Derinda Late Other Clinician: Referring Jizel Cheeks: Derinda Late Treating Iris Hairston/Extender: Skipper Cliche in Treatment: 2 Clinic Level of Care  Assessment Items TOOL 4 Quantity Score X - Use when only an EandM is performed on FOLLOW-UP visit 1 0 ASSESSMENTS - Nursing Assessment / Reassessment X - Reassessment of Co-morbidities (includes updates in patient status) 1 10 X- 1 5 Reassessment of Adherence to Treatment Plan ASSESSMENTS - Wound and Skin Assessment / Reassessment X - Simple Wound Assessment / Reassessment - one wound 1 5 []  - 0 Complex Wound Assessment / Reassessment - multiple wounds []  - 0 Dermatologic / Skin Assessment (not related to wound area) ASSESSMENTS - Focused Assessment []  - Circumferential Edema Measurements - multi extremities 0 []  - 0 Nutritional Assessment / Counseling / Intervention []  - 0 Lower Extremity Assessment (monofilament, tuning fork, pulses) []  - 0 Peripheral Arterial Disease Assessment (using hand held doppler) ASSESSMENTS - Ostomy and/or Continence Assessment and Care []  - Incontinence Assessment and Management 0 []  - 0 Ostomy Care Assessment and Management (repouching, etc.) PROCESS - Coordination of Care X - Simple Patient / Family Education for ongoing care 1 15 []  - 0 Complex (extensive) Patient / Family Education for ongoing care []  - 0 Staff obtains Programmer, systems, Records, Test Results / Process Orders []  - 0 Staff telephones HHA, Nursing Homes / Clarify orders / etc []  - 0 Routine Transfer to another Facility (non-emergent condition) []  - 0 Routine Hospital Admission (non-emergent condition) []  - 0 New Admissions / Biomedical engineer / Ordering NPWT, Apligraf, etc. []  - 0 Emergency Hospital Admission (emergent condition) X- 1 10 Simple Discharge Coordination []  - 0 Complex (extensive) Discharge Coordination PROCESS - Special Needs []  - Pediatric / Minor Patient Management 0 []  - 0 Isolation Patient Management []  - 0 Hearing / Language / Visual special needs []  - 0 Assessment of Community assistance (transportation, D/C planning, etc.) []  - 0 Additional  assistance / Altered mentation []  - 0 Support Surface(s) Assessment (bed, cushion, seat, etc.) INTERVENTIONS - Wound Cleansing / Measurement [name], [name] ([id number]) X- 1 5 Simple Wound Cleansing -  one wound []  - 0 Complex Wound Cleansing - multiple wounds X- 1 5 Wound Imaging (photographs - any number of wounds) []  - 0 Wound Tracing (instead of photographs) X- 1 5 Simple Wound Measurement - one wound []  - 0 Complex Wound Measurement - multiple wounds INTERVENTIONS - Wound Dressings X - Small Wound Dressing one or multiple wounds 1 10 []  - 0 Medium Wound Dressing one or multiple wounds []  - 0 Large Wound Dressing one or multiple wounds []  - 0 Application of Medications - topical []  - 0 Application of Medications - injection INTERVENTIONS - Miscellaneous []  - External ear exam 0 []  - 0 Specimen Collection (cultures, biopsies, blood, body fluids, etc.) []  - 0 Specimen(s) / Culture(s) sent or taken to Lab for analysis []  - 0 Patient Transfer (multiple staff / [employer] fast streamer / Similar devices) []  - 0 Simple Staple / Suture removal (25 or less) []  - 0 Complex Staple / Suture removal (26 or more) []  - 0 Hypo / Hyperglycemic Management (close monitor of Blood Glucose) []  - 0 Ankle / Brachial Index (ABI) - do not check if billed separately X- 1 5 Vital Signs Has the patient been seen at the hospital within the last three years: Yes Total Score: 75 Level Of Care: New/Established - Level 2 Electronic Signature(s) Signed: 12/01/2021 8:26:07 AM By: Carlene Coria RN Entered By: Carlene Coria on 11/28/2021 11:26:05 [name] ([id number]) -------------------------------------------------------------------------------- Encounter Discharge Information Details [name]. Date of Service: 11/28/2021 11:00 AM Medical Record Number: [id number] Patient Account Number: [id number] Date of Birth/Sex: 11/10/1925 (88 y.o. F) Treating RN: Carlene Coria Primary  Care Karan Inclan: Derinda Late Other Clinician: Referring Chanze Teagle: Derinda Late Treating Roberto Hlavaty/Extender: Skipper Cliche in Treatment: 2 Encounter Discharge Information Items Discharge Condition: Stable Ambulatory Status: Wheelchair Discharge Destination: Home Transportation: Private Auto Accompanied By: daughter Schedule Follow-up Appointment: Yes Clinical Summary of Care: Patient Declined Electronic Signature(s) Signed: 12/01/2021 8:26:07 AM By: Carlene Coria RN Entered By: Carlene Coria on 11/28/2021 11:27:41 [name], [name] ([id number]) -------------------------------------------------------------------------------- Lower Extremity Assessment Details [name]. Date of Service: 11/28/2021 11:00 AM Medical Record Number: [id number] Patient Account Number: [id number] Date of Birth/Sex: 01-15-1926 (88 y.o. F) Treating RN: Carlene Coria Primary Care Clarance Bollard: Derinda Late Other Clinician: Referring Bryam Taborda: Derinda Late Treating Shekina Cordell/Extender: Skipper Cliche in Treatment: 2 Electronic Signature(s) Signed: 12/01/2021 8:26:07 AM By: Carlene Coria RN Entered By: Carlene Coria on 11/28/2021 11:14:20 [name] ([id number]) -------------------------------------------------------------------------------- Multi Wound Chart Details Patient Name: Brittany, SAYED C. Date of Service: 11/28/2021 11:00 AM Medical Record Number: [id number] Patient Account Number: [id number] Date of Birth/Sex: [birth year] (88 y.o. F) Treating RN: Carlene Coria Primary Care Violeta Lecount: Derinda Late Other Clinician: Referring Feliz Lincoln: Derinda Late Treating Reshad Saab/Extender: Skipper Cliche in Treatment: 2 Vital Signs Height(in): 60 Pulse(bpm): 55 Weight(lbs): 51 Blood Pressure(mmHg): 145/73 Body Mass Index(BMI): 18.4 Temperature(F): 97.2 Respiratory Rate(breaths/min): 18 Photos: [N/A:N/A] Wound Location: Left, Anterior Upper Arm N/A N/A Wounding Event:  Hematoma N/A N/A Primary Etiology: Trauma, Other N/A N/A Comorbid History: Cataracts, Deep Vein Thrombosis, N/A N/A Hypertension, Osteoarthritis, Dementia Date Acquired: 10/06/2021 N/A N/A Weeks of Treatment: 2 N/A N/A Wound Status: Open N/A N/A Wound Recurrence: No N/A N/A Measurements L x W x D (cm) 2x0.1x0.1 N/A N/A Area (cm) : 0.157 N/A N/A Volume (cm) : 0.016 N/A N/A % Reduction in Area: 97.20% N/A N/A % Reduction in Volume: 99.40% N/A N/A Classification: Full Thickness Without Exposed N/A N/A Support Structures Exudate Amount: Medium  N/A N/A Exudate Type: Serosanguineous N/A N/A Exudate Color: red, brown N/A N/A Granulation Amount: Large (67-100%) N/A N/A Granulation Quality: Red N/A N/A Necrotic Amount: None Present (0%) N/A N/A Exposed Structures: Fat Layer (Subcutaneous Tissue): N/A N/A Yes Fascia: No Tendon: No Muscle: No Joint: No Bone: No Epithelialization: None N/A N/A Treatment Notes Electronic Signature(s) Signed: 12/01/2021 8:26:07 AM By: Carlene Coria RN Entered By: Carlene Coria on 11/28/2021 11:16:49 [name] ([id number]) -------------------------------------------------------------------------------- [place] Details [name]. Date of Service: 11/28/2021 11:00 AM Medical Record Number: [id number] Patient Account Number: [id number] Date of Birth/Sex: [birth year] (88 y.o. F) Treating RN: Carlene Coria Primary Care Tarita Deshmukh: Derinda Late Other Clinician: Referring Denny Mccree: Derinda Late Treating Gazella Anglin/Extender: Skipper Cliche in Treatment: 2 Active Inactive Electronic Signature(s) Signed: 12/15/2021 11:46:05 AM By: Levora Dredge Signed: 12/20/2021 4:38:19 PM By: Carlene Coria RN Previous Signature: 12/01/2021 8:26:07 AM Version By: Carlene Coria RN Entered By: Levora Dredge on 12/15/2021 11:46:05 [name], [name]  ([id number]) -------------------------------------------------------------------------------- Pain Assessment Details Patient Name: Brittany, [name]. Date of Service: 11/28/2021 11:00 AM Medical Record Number: [id number] Patient Account Number: [id number] Date of Birth/Sex: 07-03-1926 (88 y.o. F) Treating RN: Carlene Coria Primary Care Jordane Hisle: Derinda Late Other Clinician: Referring Paulett Kaufhold: Derinda Late Treating Jaxin Fulfer/Extender: Skipper Cliche in Treatment: 2 Active Problems Location of Pain Severity and Description of Pain Patient Has Paino No Site Locations Pain Management and Medication Current Pain Management: Electronic Signature(s) Signed: 12/01/2021 8:26:07 AM By: Carlene Coria RN Entered By: Carlene Coria on 11/28/2021 11:06:04 [name] ([id number]) -------------------------------------------------------------------------------- Patient/Caregiver Education Details [name] Date of Service: 11/28/2021 11:00 AM Medical Record Number: [id number] Patient Account Number: [id number] Date of Birth/Gender: [birth year] (88 y.o. F) Treating RN: Carlene Coria Primary Care Physician: Derinda Late Other Clinician: Referring Physician: Derinda Late Treating Physician/Extender: Skipper Cliche in Treatment: 2 Education Assessment Education Provided To: Patient Education Topics Provided Wound/Skin Impairment: Methods: Explain/Verbal Responses: State content correctly Electronic Signature(s) Signed: 12/01/2021 8:26:07 AM By: Carlene Coria RN Entered By: Carlene Coria on 11/28/2021 11:26:27 [name] ([id number]) -------------------------------------------------------------------------------- Wound Assessment Details Patient Name: Brittany Lolling C. Date of Service: 11/28/2021 11:00 AM Medical Record Number: [id number] Patient Account Number: [id number] Date of Birth/Sex: 1926/06/16 (88 y.o. F) Treating RN: Carlene Coria Primary Care  Oluwadarasimi Redmon: Derinda Late Other Clinician: Referring Shawnika Pepin: Derinda Late Treating Jacolby Risby/Extender: Skipper Cliche in Treatment: 2 Wound Status Wound Number: 4 Primary Trauma, Other Etiology: Wound Location: Left, Anterior Upper Arm Wound Status: Open Wounding Event: Hematoma Comorbid Cataracts, Deep Vein Thrombosis, Hypertension, Date Acquired: 10/06/2021 History: Osteoarthritis, Dementia Weeks Of Treatment: 2 Clustered Wound: No Photos Wound Measurements Length: (cm) 2 Width: (cm) 0.1 Depth: (cm) 0.1 Area: (cm) 0.157 Volume: (cm) 0.016 % Reduction in Area: 97.2% % Reduction in Volume: 99.4% Epithelialization: None Tunneling: No Undermining: No Wound Description Classification: Full Thickness Without Exposed Support Structures Exudate Amount: Medium Exudate Type: Serosanguineous Exudate Color: red, brown Foul Odor After Cleansing: No Slough/Fibrino No Wound Bed Granulation Amount: Large (67-100%) Exposed Structure Granulation Quality: Red Fascia Exposed: No Necrotic Amount: None Present (0%) Fat Layer (Subcutaneous Tissue) Exposed: Yes Tendon Exposed: No Muscle Exposed: No Joint Exposed: No Bone Exposed: No Electronic Signature(s) Signed: 12/01/2021 8:26:07 AM By: Carlene Coria RN Entered By: Carlene Coria on 11/28/2021 11:13:56 [name], [name] ([id number]) -------------------------------------------------------------------------------- Vitals Details [name] Date of Service: 11/28/2021 11:00 AM Medical Record Number: [id number] Patient Account Number: [id number] Date of Birth/Sex: Sep 28, 1926 (88 y.o.  F) Treating RN: Carlene Coria Primary Care Dorrien Grunder: Derinda Late Other Clinician: Referring Catalena Stanhope: Derinda Late Treating Jaqwon Manfred/Extender: Skipper Cliche in Treatment: 2 Vital Signs Time Taken: 11:05 Temperature (F): 97.2 Height (in): 60 Pulse (bpm): 71 Weight (lbs): 94 Respiratory Rate (breaths/min):  18 Body Mass Index (BMI): 18.4 Blood Pressure (mmHg): 145/73 Reference Range: 80 - 120 mg / dl Electronic Signature(s) Signed: 12/01/2021 8:26:07 AM By: Carlene Coria RN Entered By: Carlene Coria on 11/28/2021 11:05:56

## 2021-12-06 ENCOUNTER — Other Ambulatory Visit: Payer: Self-pay

## 2021-12-06 DIAGNOSIS — S40022A Contusion of left upper arm, initial encounter: Secondary | ICD-10-CM | POA: Diagnosis not present

## 2021-12-06 NOTE — Progress Notes (Signed)
ATLAS, KUC ([id number]) Visit Report for 12/06/2021 Arrival Information Details [name], [name]. Date of Service: 12/06/2021 12:45 PM Medical Record Number: [id number] Patient Account Number: [id number] Date of Birth/Sex: [birth year] (88 y.o. F) Treating RN: Levora Dredge Primary Care Raahim Shartzer: Derinda Late Other Clinician: Referring Dalinda Heidt: Derinda Late Treating Beckie Viscardi/Extender: Skipper Cliche in Treatment: 3 Visit Information History Since Last Visit Added or deleted any medications: No Patient Arrived: Wheel Chair Any new allergies or adverse reactions: No Arrival Time: 12:46 Had a fall or experienced change in No Accompanied By: daughter in law activities of daily living that may affect Transfer Assistance: EasyPivot Patient risk of falls: Lift Hospitalized since last visit: No Patient Identification Verified: Yes Has Dressing in Place as Prescribed: Yes Secondary Verification Process Completed: Yes Pain Present Now: No Patient Requires Transmission-Based No Precautions: Patient Has Alerts: No Electronic Signature(s) Signed: 12/06/2021 1:43:43 PM By: Levora Dredge Entered By: Levora Dredge on 12/06/2021 12:47:22 [name] ([id number]) -------------------------------------------------------------------------------- Clinic Level of Care Assessment Details [name]. Date of Service: 12/06/2021 12:45 PM Medical Record Number: [id number] Patient Account Number: [id number] Date of Birth/Sex: 27-Oct-1925 (88 y.o. F) Treating RN: Levora Dredge Primary Care Kenwood Rosiak: Derinda Late Other Clinician: Referring Jaevian Shean: Derinda Late Treating Jerrol Helmers/Extender: Skipper Cliche in Treatment: 3 Clinic Level of Care Assessment Items TOOL 4 Quantity Score []  - Use when only an EandM is performed on FOLLOW-UP visit 0 ASSESSMENTS - Nursing Assessment / Reassessment []  - Reassessment of Co-morbidities (includes  updates in patient status) 0 []  - 0 Reassessment of Adherence to Treatment Plan ASSESSMENTS - Wound and Skin Assessment / Reassessment X - Simple Wound Assessment / Reassessment - one wound 1 5 []  - 0 Complex Wound Assessment / Reassessment - multiple wounds []  - 0 Dermatologic / Skin Assessment (not related to wound area) ASSESSMENTS - Focused Assessment []  - Circumferential Edema Measurements - multi extremities 0 []  - 0 Nutritional Assessment / Counseling / Intervention []  - 0 Lower Extremity Assessment (monofilament, tuning fork, pulses) []  - 0 Peripheral Arterial Disease Assessment (using hand held doppler) ASSESSMENTS - Ostomy and/or Continence Assessment and Care []  - Incontinence Assessment and Management 0 []  - 0 Ostomy Care Assessment and Management (repouching, etc.) PROCESS - Coordination of Care X - Simple Patient / Family Education for ongoing care 1 15 []  - 0 Complex (extensive) Patient / Family Education for ongoing care []  - 0 Staff obtains Programmer, systems, Records, Test Results / Process Orders []  - 0 Staff telephones HHA, Nursing Homes / Clarify orders / etc []  - 0 Routine Transfer to another Facility (non-emergent condition) []  - 0 Routine Hospital Admission (non-emergent condition) []  - 0 New Admissions / Biomedical engineer / Ordering NPWT, Apligraf, etc. []  - 0 Emergency Hospital Admission (emergent condition) X- 1 10 Simple Discharge Coordination []  - 0 Complex (extensive) Discharge Coordination PROCESS - Special Needs []  - Pediatric / Minor Patient Management 0 []  - 0 Isolation Patient Management []  - 0 Hearing / Language / Visual special needs []  - 0 Assessment of Community assistance (transportation, D/C planning, etc.) []  - 0 Additional assistance / Altered mentation []  - 0 Support Surface(s) Assessment (bed, cushion, seat, etc.) INTERVENTIONS - Wound Cleansing / Measurement Perrow, Derricka C. ([id number]) X- 1 5 Simple Wound Cleansing  - one wound []  - 0 Complex Wound Cleansing - multiple wounds []  - 0 Wound Imaging (photographs - any number of wounds) []  - 0 Wound Tracing (instead of photographs) []  - 0 Simple  Wound Measurement - one wound []  - 0 Complex Wound Measurement - multiple wounds INTERVENTIONS - Wound Dressings X - Small Wound Dressing one or multiple wounds 1 10 []  - 0 Medium Wound Dressing one or multiple wounds []  - 0 Large Wound Dressing one or multiple wounds []  - 0 Application of Medications - topical []  - 0 Application of Medications - injection INTERVENTIONS - Miscellaneous []  - External ear exam 0 []  - 0 Specimen Collection (cultures, biopsies, blood, body fluids, etc.) []  - 0 Specimen(s) / Culture(s) sent or taken to Lab for analysis []  - 0 Patient Transfer (multiple staff / Harrel Lemon Lift / Similar devices) []  - 0 Simple Staple / Suture removal (25 or less) []  - 0 Complex Staple / Suture removal (26 or more) []  - 0 Hypo / Hyperglycemic Management (close monitor of Blood Glucose) []  - 0 Ankle / Brachial Index (ABI) - do not check if billed separately []  - 0 Vital Signs Has the patient been seen at the hospital within the last three years: Yes Total Score: 45 Level Of Care: New/Established - Level 2 Electronic Signature(s) Signed: 12/06/2021 1:43:43 PM By: Levora Dredge Entered By: Levora Dredge on 12/06/2021 13:03:52 [name] ([id number]) -------------------------------------------------------------------------------- Encounter Discharge Information Details [name]. Date of Service: 12/06/2021 12:45 PM Medical Record Number: [id number] Patient Account Number: [id number] Date of Birth/Sex: 21-Apr-1926 (88 y.o. F) Treating RN: Levora Dredge Primary Care Jireh Elmore: Derinda Late Other Clinician: Referring Elizabella Nolet: Derinda Late Treating Sheila Gervasi/Extender: Skipper Cliche in Treatment: 3 Encounter Discharge Information Items Discharge  Condition: Stable Ambulatory Status: Wheelchair Discharge Destination: Home Transportation: Private Auto Accompanied By: daughter in law Schedule Follow-up Appointment: Yes Clinical Summary of Care: Electronic Signature(s) Signed: 12/06/2021 1:43:43 PM By: Levora Dredge Entered By: Levora Dredge on 12/06/2021 13:03:33 [name] ([id number]) -------------------------------------------------------------------------------- Wound Assessment Details [name]. Date of Service: 12/06/2021 12:45 PM Medical Record Number: [id number] Patient Account Number: [id number] Date of Birth/Sex: 12/01/1925 (88 y.o. F) Treating RN: Levora Dredge Primary Care Merion Grimaldo: Derinda Late Other Clinician: Referring Camiah Humm: Derinda Late Treating Ashten Prats/Extender: Skipper Cliche in Treatment: 3 Wound Status Wound Number: 4 Primary Trauma, Other Etiology: Wound Location: Left, Anterior Upper Arm Wound Status: Open Wounding Event: Hematoma Comorbid Cataracts, Deep Vein Thrombosis, Hypertension, Date Acquired: 10/06/2021 History: Osteoarthritis, Dementia Weeks Of Treatment: 3 Clustered Wound: No Wound Measurements Length: (cm) 2 Width: (cm) 0.1 Depth: (cm) 0.1 Area: (cm) 0.157 Volume: (cm) 0.016 % Reduction in Area: 97.2% % Reduction in Volume: 99.4% Epithelialization: Large (67-100%) Tunneling: No Undermining: No Wound Description Classification: Full Thickness Without Exposed Support Structures Exudate Amount: Medium Exudate Type: Serosanguineous Exudate Color: red, brown Foul Odor After Cleansing: No Slough/Fibrino No Wound Bed Granulation Amount: Small (1-33%) Exposed Structure Granulation Quality: Red Fascia Exposed: No Necrotic Amount: None Present (0%) Fat Layer (Subcutaneous Tissue) Exposed: Yes Tendon Exposed: No Muscle Exposed: No Joint Exposed: No Bone Exposed: No Assessment Notes small scab removed, wound looks almost fully  healed pt tolerated dressing change without issue Treatment Notes Wound #4 (Upper Arm) Wound Laterality: Left, Anterior Cleanser Peri-Wound Care Topical Primary Dressing Xeroform-HBD 2x2 (in/in) Discharge Instruction: multiple layers Secondary Dressing ABD Pad 5x9 (in/in) Discharge Instruction: Cover with ABD pad Kerlix 4.5 x 4.1 (in/yd) Discharge Instruction: Apply Kerlix 4.5 x 4.1 (in/yd) as instructed Secured With 15M Medipore H Soft Cloth Surgical Tape, 2x2 (in/yd) Dziedzic, Zan C. ([id number]) Compression Wrap Compression Stockings Add-Ons ace wrap and geri sleeve Electronic Signature(s) Signed: 12/06/2021 1:43:43  PM By: Levora Dredge Entered By: Levora Dredge on 12/06/2021 13:02:14

## 2021-12-06 NOTE — Progress Notes (Signed)
EMALIE, MCWETHY ([id number]) Visit Report for 12/06/2021 Physician Orders Details [name], [name]. Date of Service: 12/06/2021 12:45 PM Medical Record Number: [id number] Patient Account Number: [id number] Date of Birth/Sex: 08-Aug-1926 (88 y.o. F) Treating RN: Levora Dredge Primary Care Provider: Derinda Late Other Clinician: Referring Provider: Derinda Late Treating Provider/Extender: Skipper Cliche in Treatment: 3 Verbal / Phone Orders: No Diagnosis Coding Follow-up Appointments o Return Appointment in 1 week. [place] for wound care. May utilize formulary equivalent dressing for wound treatment orders unless otherwise specified. Home Health Nurse may visit PRN to address patientos wound care needs. - [facility] Bathing/ Shower/ Hygiene o May shower; gently cleanse wound with antibacterial soap, rinse and pat dry prior to dressing wounds Wound Treatment Wound #4 - Upper Arm Wound Laterality: Left, Anterior Primary Dressing: Xeroform-HBD 2x2 (in/in) 3 x Per Week/30 Days Discharge Instructions: multiple layers Secondary Dressing: ABD Pad 5x9 (in/in) 3 x Per Week/30 Days Discharge Instructions: Cover with ABD pad Secondary Dressing: Kerlix 4.5 x 4.1 (in/yd) 3 x Per Week/30 Days Discharge Instructions: Apply Kerlix 4.5 x 4.1 (in/yd) as instructed Secured With: 87M Medipore H Soft Cloth Surgical Tape, 2x2 (in/yd) 3 x Per Week/30 Days Add-Ons: ace wrap and geri sleeve 3 x Per Week/30 Days Electronic Signature(s) Signed: 12/06/2021 1:43:43 PM By: Levora Dredge Signed: 12/06/2021 2:51:33 PM By: Worthy Keeler PA-C Entered By: Levora Dredge on 12/06/2021 13:03:02 Gautreau, [name] ([id number]) -------------------------------------------------------------------------------- SuperBill Details Patient Name: [name]. Date of Service: 12/06/2021 Medical Record Number: [id number] Patient Account Number:  [id number] Date of Birth/Sex: [birth year] (88 y.o. F) Treating RN: Levora Dredge Primary Care Provider: Derinda Late Other Clinician: Referring Provider: Derinda Late Treating Provider/Extender: Skipper Cliche in Treatment: 3 Diagnosis Coding ICD-10 Codes Code Description [phone] Contusion of left upper arm, subsequent encounter L98.498 Non-pressure chronic ulcer of skin of other sites with other specified severity Facility Procedures CPT4 Code: 28786767 Description: 20947 - WOUND CARE VISIT-LEV 2 EST PT Modifier: Quantity: 1 Electronic Signature(s) Signed: 12/06/2021 1:43:43 PM By: Levora Dredge Signed: 12/06/2021 2:51:33 PM By: Worthy Keeler PA-C Entered By: Levora Dredge on 12/06/2021 13:03:59

## 2021-12-15 ENCOUNTER — Ambulatory Visit: Payer: Medicare Other | Admitting: Physician Assistant

## 2023-01-25 ENCOUNTER — Other Ambulatory Visit: Payer: Medicare Other

## 2023-01-25 DIAGNOSIS — Z515 Encounter for palliative care: Secondary | ICD-10-CM

## 2023-01-25 NOTE — Progress Notes (Signed)
TELEPHONE ENCOUNTER  Palliative care SW connected with patients daughter in law [place], in reference to new PC referral from PCP. SW reviewed new PC criteria.  [Patient has end stage alzheimer's dementia. Daughter in law shared that patient has appeared to have a decline over the past few weeks to a month to include patient not being ambulatory as much any more, patients speech is more incoherent, patient is sleeping more, patient is eating less. DIL shared that had a family member in the past that was on PC services that eventually transitioned to hospice and she is unsure if patient meets new criteria to determine eligibility.  Plan: PC to see patient in 2 weeks to assess and determine eligibility.

## 2023-02-06 ENCOUNTER — Other Ambulatory Visit: Payer: Medicare Other

## 2023-02-06 VITALS — BP 114/68 | HR 68 | Resp 18

## 2023-02-06 DIAGNOSIS — Z515 Encounter for palliative care: Secondary | ICD-10-CM

## 2023-02-06 NOTE — Progress Notes (Signed)
[facility]ALLIATIVE CARE SW NOTE  [name] DOB: 1926/06/19 MRN: [id number]  PRIMARY CARE PROVIDER: Kandyce Rud, MD  RESPONSIBLE PARTY:  Acct ID - Guarantor Home Phone Work Phone Relationship Acct Type  [id number] Brittany Mend* [phone]  Self P/F     [address] [place], [place] [address]     PLAN OF CARE and INTERVENTIONS:              GOALS OF CARE/ ADVANCE CARE PLANNING:    Goals include to maximize quality of life and assist with pain management. Our advance care planning conversation included a discussion about:    The value and importance of advance care planning  Review and updating or creation of an advance directive document.                          Code Status: DNR                         ACD: patient has HCPOA in place identifying her son as agents.                         GOC: Patient stay out of the hospital as mush as possible.Marland Kitchen  2.        SOCIAL/EMOTIONAL/SPIRITUAL ASSESSMENT/ INTERVENTIONS:         Palliative care encounter: SW  and RN, PJ, completed initial in home visit with patient and patients daughter in law [name] and full time caregiver [name]. PC criteria reviewed and discussed.    Presenting problem: patient with late Alzheimer's dementia, arthritis, hypertension and other medical complexities.     Cognition: per family patient has become more confused over the past few months. Motor skills have decreased. Per family patients agitation has improved since starting Seroquel and trazadone.  Dementia care resource booklet left with family.   Mobility: patient is MINA with all ADL's and is ambulatory without AD. Patient sleeps on first level of her home in a hospital bed. Patient has stair chair lift bur has not used in a while.  Appetite: Patient share her appetite is fair.    Psychosocial assessment: completed.   In home support: Patient lives independently. Has 24/7 live in care givers.  Transportation: no needs.   Food: no  food insecurities witnessed.   Resources: none   Safety and long term planning: patient feels safe in her home and desires to remain in her home with family support.   SW discussed goals, reviewed care plan, provided emotional support, used active and reflective listening in the form of reciprocity emotional response. Questions and concerns were addressed. The patient was encouraged to call with any additional questions and/or concerns. PC Provided general support and encouragement, no other unmet needs identified.   PC will follow up in 3-4 weeks and determine eligibility for ongoing device at that time.   3.         PATIENT/CAREGIVER EDUCATION/ COPING:   Appearance: well groomed, appropriate given situation  Mental Status: Alert and oriented to self Eye Contact: good  Thought Process: rational  Thought Content: not assessed  Speech: good/clear but HOH Mood: Normal and calm Affect: Congruent to endorsed mood, full ranging Insight: poor Judgement: poor Interaction Style: Cooperative   Patient A&O and able to make needs known. Patients cognition is poor and depends on children to fill in gaps of hx. Patient withdrawn and  does not engage in or initiate conversation during visit. PHQ9: 0. Patient denies S/S of anxiety and depression.   Social hx: patient is a retired Neurosurgeon. Patients deceased spouse was an [employer] banker, operational.   4.         PERSONAL EMERGENCY PLAN:  Patient/family will call 9-1-1 for emergencies.    5.         COMMUNITY RESOURCES COORDINATION/ HEALTH CARE NAVIGATION:  patients daughters manages her care.    6.      FINANCIAL CONCERNS/NEEDS: none                         Primary Health Insurance: medicare  Secondary Health Insurance: none Prescription Coverage: Yes, no history of difficulty obtaining or affording prescriptions reported.     SOCIAL HX:  Social History   Tobacco Use   Smoking status: Never   Smokeless tobacco: Never  Substance  Use Topics   Alcohol use: Never    CODE STATUS: DNR  ADVANCED DIRECTIVES: Y MOST FORM COMPLETE:  N HOSPICE EDUCATION PROVIDED: Y  PPS: 40%  Time spent: 50 min        [place], [place]

## 2023-02-07 NOTE — Progress Notes (Signed)
1247 Palliative Care Initial Encounter Note   [name] DOB: February 02, 1926 MRN: [id number]  PRIMARY CARE PROVIDER: Kandyce Rud, MD  RESPONSIBLE PARTY:  Acct ID - Guarantor Home Phone Work Phone Relationship Acct Type  [id number] [name] [phone]  Self P/F     [address], [place], [place] [address]   RN & Asia Perry,SW completed home visit. DIL- Britt & Caregiver, Stanton Kidney also present    HISTORY OF PRESENT ILLNESS:   89 y.o. female with PMH significant for essential hypertension, dementia, vertigo, atrial fibrillation on Coumadin,and MI.    Socially: Pt lives in two story home, but pt doesn't go upstairs. Pt stays in hospital bed in downstairs room. Pt goes riding around with caregivers and goes for walks some.    Cognitive: Pt gets confused and has "sun downers" in the evenings. Mood stabilizer started by MD to give at night. Family notes positive changes in cognition over the last week. Family wonders if it was the mood stabilizer. Pt is HOH. Uses deceased husbands hearing aids.     RN/SW educated DIL and caregiver on phases of dementia and how to divert attention and recognize changes sooner in the afternoons. Dementia booklet given and explained to DIL.  Appetite: Eats 2 meals per day on average. Will eat snacks. Weight seems to be fairly stable. Drinks approx. 32 ounces of water per day.    Mobility: walks with one person assist. Also uses a wheelchair at times when legs hurt. Does not remember to use walker. Denies any falls.    Sleeping Pattern: Caregiver reports that she sleeps well. Approx 10-12 hrs at night and takes naps. Has hospital bed with rails for sleeping. Also has a mat alarm that will go off when she stands on it. Pt takes Seroquel and trazadone before bed.  Pain: c/o pain in legs, unable to rate pain   Gi/GU: Wears depends. Not able to tell when she has to use the bathroom a lot of the times.   Palliative Care/ Hospice: RN explained  role and purpose of palliative care including visit frequency. Also discussed benefits of hospice care as well as the differences between the two with patient.    Next appt scheduled: 03/06/23      PHYSICAL EXAM:   VITALS: Today's Vitals   02/06/23 1312  BP: 114/68  Pulse: 68  Resp: 18  SpO2: 94%    LUNGS: lungs clear bilat CARDIAC:  irregular, no JVD EXTREMITIES: MAE x 4, no edema    Barbette Merino, RN

## 2023-03-07 ENCOUNTER — Other Ambulatory Visit: Payer: Medicare Other

## 2023-03-07 DIAGNOSIS — Z515 Encounter for palliative care: Secondary | ICD-10-CM

## 2023-03-07 NOTE — Progress Notes (Addendum)
COMMUNITY PALLIATIVE CARE SW NOTE  [name] DOB: 1926-03-08 MRN: [id number]  PRIMARY CARE PROVIDER: Kandyce Rud, MD  RESPONSIBLE PARTY:  Acct ID - Guarantor Home Phone Work Phone Relationship Acct Type  [id number] [name] [phone]  Self P/F     [address] [place], [place] [address]     PLAN OF CARE and INTERVENTIONS:              Palliative care encounter: PC SW completed f/u PC home visit with patient and caregiver.  Socially: Pt is going grocery shopping with caregiver today.    Cognitive:  No dementia related behaviors since previous PC visit and change of medications.   Bruising: patient has bruising to her lower arms and lower legs due to hitting them against table and WC.  Appetite: Eats 2 meals per day on average. Will eat snacks. Weight seems to be fairly stable. Drinks approx. 32 ounces of water per day.    Mobility: walks with one person assist. Denies any falls. Patient uses WC also around the home. Caregivers provide assistance with bathing and showering.   Sleeping Pattern: Caregiver reports that she sleeps well. Approx 10-12 hrs at night and takes naps. Has hospital bed with rails for sleeping. Also has a mat alarm that will go off when she stands on it. Pt takes Seroquel and trazadone before bed.   Pain: c/o pain in legs at times, unable to rate pain. Takes tylenol prn.   Gi/GU: Wears depends. Caregiver shares that she walks to [facility] and lets her know she has to use the restroom.  In home support: patient still has 24/7 caregivers in place.     Plan: PC to follow up in 3-4 weeks.  SOCIAL HX:  Social History   Tobacco Use   Smoking status: Never   Smokeless tobacco: Never  Substance Use Topics   Alcohol use: Never    CODE STATUS: DNR ADVANCED DIRECTIVES: Y - son listed as POA MOST FORM COMPLETE:  N HOSPICE EDUCATION PROVIDED: N  PPS: 40%  TIME SPENT: 40 MIN      [name], [place]

## 2023-04-04 ENCOUNTER — Other Ambulatory Visit: Payer: Medicare Other

## 2023-04-04 DIAGNOSIS — Z515 Encounter for palliative care: Secondary | ICD-10-CM

## 2023-04-04 NOTE — Progress Notes (Signed)
COMMUNITY PALLIATIVE CARE SW NOTE  [name] DOB: [birth year] MRN: [id number]  PRIMARY CARE PROVIDER: Kandyce Rud, MD  RESPONSIBLE PARTY:  Acct ID - Guarantor Home Phone Work Phone Relationship Acct Type  [id number] [name]* [phone]  Self P/F     [address] [place], [place] [address]     PLAN OF CARE and INTERVENTIONS:              Palliative care encounter: PC SW completed f/u PC home visit with patient and caregiver, [name]. Caregiver endorses that patient has been doing very well since previous PC visit.    Cognitive:  No dementia related behaviors since previous PC visit and change of medications. Patient is very [facility] and has difficulty with thought processing. Patient is able to answer simple Y/N questions. Patient is pleasantly confused.   Appetite: Eats 2 meals per day on average. Will eat snacks. Weight seems to be fairly stable. Drinks approx. 32 ounces of water per day. Weight obtained today: 95lbs. last weight obtained 3/14: 103lbs. patient has had a 8lb weight loss in 3 months. Education provided around weght loss and offering supplement drinks to patient throught out the day.   Mobility: walks with one person assist. Denies any falls. Patient uses WC also around the home. Caregivers provide assistance with bathing and showering. Patient is able to feed to herself.   Sleeping Pattern: Caregiver reports that she sleeps well. Approx 10-12 hrs at night and takes naps. Has hospital bed with rails for sleeping. Also has a mat alarm that will go off when she stands on it. Pt takes Seroquel and trazadone before bed.   Pain: 0/10   Gi/GU: Wears depends. Caregiver shares that she walks to [facility] and lets her know she has to use the restroom.   In home support: patient still has 24/7 caregivers in place.     Plan: PC to follow up in 3-4 weeks.       SOCIAL HX:  Social History   Tobacco Use   Smoking status: Never   Smokeless tobacco: Never   Substance Use Topics   Alcohol use: Never    CODE STATUS: DNR ADVANCED DIRECTIVES: Y  PPS:40%  TIME SPENT: 40 MIN      [name], [place]

## 2023-04-30 ENCOUNTER — Other Ambulatory Visit: Payer: Medicare Other

## 2023-05-24 DEATH — deceased
